# Patient Record
Sex: Male | Born: 1960
Health system: Southern US, Community
[De-identification: ages and names within clinical notes are randomized; demographics above are authoritative.]

## PROBLEM LIST (undated history)

## (undated) ENCOUNTER — Ambulatory Visit: Discharge status: Absent without leave | Payer: Self-pay

## (undated) DIAGNOSIS — I2510 Atherosclerotic heart disease of native coronary artery without angina pectoris: Secondary | ICD-10-CM

## (undated) DIAGNOSIS — E669 Obesity, unspecified: Secondary | ICD-10-CM

## (undated) DIAGNOSIS — J189 Pneumonia, unspecified organism: Secondary | ICD-10-CM

## (undated) DIAGNOSIS — E785 Hyperlipidemia, unspecified: Secondary | ICD-10-CM

## (undated) DIAGNOSIS — N529 Male erectile dysfunction, unspecified: Secondary | ICD-10-CM

## (undated) DIAGNOSIS — G43909 Migraine, unspecified, not intractable, without status migrainosus: Secondary | ICD-10-CM

## (undated) DIAGNOSIS — T7840XA Allergy, unspecified, initial encounter: Secondary | ICD-10-CM

## (undated) DIAGNOSIS — I219 Acute myocardial infarction, unspecified: Secondary | ICD-10-CM

## (undated) DIAGNOSIS — IMO0001 Reserved for inherently not codable concepts without codable children: Secondary | ICD-10-CM

## (undated) DIAGNOSIS — F419 Anxiety disorder, unspecified: Secondary | ICD-10-CM

## (undated) DIAGNOSIS — J449 Chronic obstructive pulmonary disease, unspecified: Secondary | ICD-10-CM

## (undated) DIAGNOSIS — G4730 Sleep apnea, unspecified: Secondary | ICD-10-CM

## (undated) DIAGNOSIS — E1121 Type 2 diabetes mellitus with diabetic nephropathy: Secondary | ICD-10-CM

## (undated) DIAGNOSIS — E119 Type 2 diabetes mellitus without complications: Secondary | ICD-10-CM

## (undated) DIAGNOSIS — I10 Essential (primary) hypertension: Secondary | ICD-10-CM

## (undated) DIAGNOSIS — Z794 Long term (current) use of insulin: Secondary | ICD-10-CM

## (undated) HISTORY — DX: Anxiety disorder, unspecified: F41.9

## (undated) HISTORY — DX: Allergy, unspecified, initial encounter: T78.40XA

## (undated) HISTORY — DX: Reserved for inherently not codable concepts without codable children: IMO0001

## (undated) HISTORY — DX: Hyperlipidemia, unspecified: E78.5

## (undated) HISTORY — PX: CYSTECTOMY: SUR359

## (undated) HISTORY — DX: Atherosclerotic heart disease of native coronary artery without angina pectoris: I25.10

## (undated) HISTORY — DX: Obesity, unspecified: E66.9

## (undated) HISTORY — PX: KNEE ARTHROSCOPY: SUR90

## (undated) HISTORY — DX: Male erectile dysfunction, unspecified: N52.9

## (undated) HISTORY — PX: CARDIAC CATHETERIZATION: SHX172

## (undated) HISTORY — DX: Pneumonia, unspecified organism: J18.9

## (undated) HISTORY — DX: Type 2 diabetes mellitus without complications: E11.9

## (undated) HISTORY — DX: Chronic obstructive pulmonary disease, unspecified: J44.9

## (undated) HISTORY — PX: OTHER SURGICAL HISTORY: SHX169

---
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
## 1994-04-07 HISTORY — PX: KNEE ARTHROSCOPY: SUR90

## 2005-04-07 DIAGNOSIS — I219 Acute myocardial infarction, unspecified: Secondary | ICD-10-CM

## 2005-04-07 HISTORY — DX: Acute myocardial infarction, unspecified: I21.9

## 2005-04-19 ENCOUNTER — Other Ambulatory Visit: Payer: Self-pay

## 2005-04-19 ENCOUNTER — Emergency Department: Payer: Self-pay | Admitting: Emergency Medicine

## 2006-01-27 ENCOUNTER — Ambulatory Visit: Payer: Self-pay

## 2006-09-29 DIAGNOSIS — N529 Male erectile dysfunction, unspecified: Secondary | ICD-10-CM | POA: Insufficient documentation

## 2009-04-13 ENCOUNTER — Ambulatory Visit: Payer: Self-pay | Admitting: Family Medicine

## 2009-05-04 ENCOUNTER — Ambulatory Visit: Payer: Self-pay | Admitting: Family Medicine

## 2009-07-29 ENCOUNTER — Ambulatory Visit: Payer: Self-pay | Admitting: Family Medicine

## 2010-05-06 ENCOUNTER — Ambulatory Visit: Payer: Self-pay | Admitting: Family Medicine

## 2010-05-08 ENCOUNTER — Ambulatory Visit: Payer: Self-pay | Admitting: Family Medicine

## 2010-06-06 ENCOUNTER — Ambulatory Visit: Payer: Self-pay | Admitting: Family Medicine

## 2010-07-07 ENCOUNTER — Ambulatory Visit: Payer: Self-pay | Admitting: Family Medicine

## 2010-08-06 ENCOUNTER — Ambulatory Visit: Payer: Self-pay | Admitting: Family Medicine

## 2010-09-03 ENCOUNTER — Ambulatory Visit: Payer: Self-pay | Admitting: Internal Medicine

## 2011-03-12 ENCOUNTER — Ambulatory Visit: Payer: Self-pay

## 2011-07-07 ENCOUNTER — Ambulatory Visit: Payer: Self-pay

## 2011-08-02 ENCOUNTER — Other Ambulatory Visit: Payer: Self-pay | Admitting: Family Medicine

## 2011-08-02 LAB — COMPREHENSIVE METABOLIC PANEL
Albumin: 3.7 g/dL (ref 3.4–5.0)
Alkaline Phosphatase: 70 U/L (ref 50–136)
Anion Gap: 9 (ref 7–16)
BUN: 16 mg/dL (ref 7–18)
Calcium, Total: 8.5 mg/dL (ref 8.5–10.1)
Chloride: 108 mmol/L — ABNORMAL HIGH (ref 98–107)
EGFR (African American): >60
Potassium: 4 mmol/L (ref 3.5–5.1)
SGOT(AST): 42 U/L — ABNORMAL HIGH (ref 15–37)
SGPT (ALT): 84 U/L — ABNORMAL HIGH
Total Protein: 6.7 g/dL (ref 6.4–8.2)

## 2011-08-02 LAB — LIPID PANEL
Cholesterol: 143 mg/dL (ref 0–200)
HDL Cholesterol: 20 mg/dL — ABNORMAL LOW (ref 40–60)
VLDL Cholesterol, Calc: 74 mg/dL — ABNORMAL HIGH (ref 5–40)

## 2012-08-13 ENCOUNTER — Ambulatory Visit: Payer: Self-pay | Admitting: Family Medicine

## 2012-12-23 ENCOUNTER — Ambulatory Visit: Payer: Self-pay | Admitting: Anesthesiology

## 2012-12-23 DIAGNOSIS — I10 Essential (primary) hypertension: Secondary | ICD-10-CM

## 2012-12-27 ENCOUNTER — Ambulatory Visit: Payer: Self-pay | Admitting: Gastroenterology

## 2012-12-27 LAB — HM COLONOSCOPY

## 2013-09-15 ENCOUNTER — Ambulatory Visit: Payer: Self-pay | Admitting: Emergency Medicine

## 2013-09-15 LAB — URINALYSIS, COMPLETE
BACTERIA: NEGATIVE
BILIRUBIN, UR: NEGATIVE
BLOOD: NEGATIVE
Glucose,UR: NEGATIVE mg/dL (ref 0–75)
Ketone: NEGATIVE
Leukocyte Esterase: NEGATIVE
NITRITE: NEGATIVE
Ph: 7 (ref 4.5–8.0)
Protein: NEGATIVE
SPECIFIC GRAVITY: 1.005 (ref 1.003–1.030)

## 2014-06-08 ENCOUNTER — Ambulatory Visit: Payer: Self-pay | Admitting: Emergency Medicine

## 2014-07-25 ENCOUNTER — Other Ambulatory Visit (HOSPITAL_COMMUNITY): Payer: Self-pay | Admitting: Nurse Practitioner

## 2014-07-25 DIAGNOSIS — R7989 Other specified abnormal findings of blood chemistry: Secondary | ICD-10-CM

## 2014-07-25 DIAGNOSIS — K760 Fatty (change of) liver, not elsewhere classified: Secondary | ICD-10-CM

## 2014-07-25 DIAGNOSIS — R945 Abnormal results of liver function studies: Principal | ICD-10-CM

## 2014-08-01 ENCOUNTER — Ambulatory Visit: Admit: 2014-08-01 | Disposition: A | Discharge status: Home / Self Care | Payer: Self-pay

## 2014-10-06 ENCOUNTER — Other Ambulatory Visit: Payer: Self-pay | Admitting: Family Medicine

## 2014-10-06 NOTE — Telephone Encounter (Signed)
Pt needs a refill on his meds Bydureon. Also pt is flying out to [facility] on 10/22/14 and flying back on 10/28/14. Pt was wondering if there was something you could give him to stop his panic attacks while flying. Pt was hoping you could give him something to try out a week before flying out. HE uses the [facility] at the hospital.

## 2014-10-10 MED ORDER — CLONAZEPAM 0.5 MG PO TABS: 0.5000 mg | ORAL_TABLET | Freq: Two times a day (BID) | ORAL | Status: DC | PRN

## 2014-10-10 MED ORDER — EXENATIDE ER 2 MG ~~LOC~~ PEN: 1.0000 "pen " | PEN_INJECTOR | SUBCUTANEOUS | Status: DC

## 2014-10-10 NOTE — Telephone Encounter (Signed)
Printed klonopin and sent [employer]

## 2014-10-10 NOTE — Telephone Encounter (Signed)
Also would like something for anxiety due to flying,

## 2014-10-11 ENCOUNTER — Other Ambulatory Visit: Payer: Self-pay | Admitting: Family Medicine

## 2014-10-11 NOTE — Telephone Encounter (Signed)
Patient requesting refill. 

## 2014-10-30 ENCOUNTER — Other Ambulatory Visit: Payer: Self-pay | Admitting: Family Medicine

## 2014-10-30 NOTE — Telephone Encounter (Signed)
Patient requesting refill. 

## 2014-11-07 ENCOUNTER — Encounter: Payer: Self-pay | Admitting: Family Medicine

## 2014-11-07 DIAGNOSIS — R6882 Decreased libido: Secondary | ICD-10-CM | POA: Insufficient documentation

## 2014-11-07 DIAGNOSIS — E7800 Pure hypercholesterolemia, unspecified: Secondary | ICD-10-CM | POA: Insufficient documentation

## 2014-11-07 DIAGNOSIS — I2510 Atherosclerotic heart disease of native coronary artery without angina pectoris: Secondary | ICD-10-CM | POA: Insufficient documentation

## 2014-11-07 DIAGNOSIS — Z9229 Personal history of other drug therapy: Secondary | ICD-10-CM | POA: Insufficient documentation

## 2014-11-07 DIAGNOSIS — I10 Essential (primary) hypertension: Secondary | ICD-10-CM | POA: Insufficient documentation

## 2014-11-07 DIAGNOSIS — K219 Gastro-esophageal reflux disease without esophagitis: Secondary | ICD-10-CM | POA: Insufficient documentation

## 2014-11-07 DIAGNOSIS — R809 Proteinuria, unspecified: Secondary | ICD-10-CM | POA: Insufficient documentation

## 2014-11-07 DIAGNOSIS — IMO0002 Reserved for concepts with insufficient information to code with codable children: Secondary | ICD-10-CM | POA: Insufficient documentation

## 2014-11-07 DIAGNOSIS — J3089 Other allergic rhinitis: Secondary | ICD-10-CM | POA: Insufficient documentation

## 2014-11-07 DIAGNOSIS — E1129 Type 2 diabetes mellitus with other diabetic kidney complication: Secondary | ICD-10-CM | POA: Insufficient documentation

## 2014-11-07 DIAGNOSIS — Z720 Tobacco use: Secondary | ICD-10-CM | POA: Insufficient documentation

## 2014-11-07 DIAGNOSIS — I252 Old myocardial infarction: Secondary | ICD-10-CM | POA: Insufficient documentation

## 2014-11-07 DIAGNOSIS — K7581 Nonalcoholic steatohepatitis (NASH): Secondary | ICD-10-CM | POA: Insufficient documentation

## 2014-11-07 DIAGNOSIS — L259 Unspecified contact dermatitis, unspecified cause: Secondary | ICD-10-CM | POA: Insufficient documentation

## 2014-11-07 DIAGNOSIS — E781 Pure hyperglyceridemia: Secondary | ICD-10-CM | POA: Insufficient documentation

## 2014-11-07 DIAGNOSIS — B351 Tinea unguium: Secondary | ICD-10-CM | POA: Insufficient documentation

## 2014-11-10 ENCOUNTER — Encounter: Payer: Self-pay | Admitting: Family Medicine

## 2014-11-10 ENCOUNTER — Ambulatory Visit (INDEPENDENT_AMBULATORY_CARE_PROVIDER_SITE_OTHER): Payer: 59 | Admitting: Family Medicine

## 2014-11-10 VITALS — BP 118/78 | HR 85 | Temp 98.4°F | Resp 14 | Ht 68.0 in | Wt 229.8 lb

## 2014-11-10 DIAGNOSIS — K219 Gastro-esophageal reflux disease without esophagitis: Secondary | ICD-10-CM | POA: Diagnosis not present

## 2014-11-10 DIAGNOSIS — I252 Old myocardial infarction: Secondary | ICD-10-CM | POA: Diagnosis not present

## 2014-11-10 DIAGNOSIS — E1121 Type 2 diabetes mellitus with diabetic nephropathy: Secondary | ICD-10-CM | POA: Diagnosis not present

## 2014-11-10 DIAGNOSIS — I2510 Atherosclerotic heart disease of native coronary artery without angina pectoris: Secondary | ICD-10-CM | POA: Diagnosis not present

## 2014-11-10 DIAGNOSIS — I10 Essential (primary) hypertension: Secondary | ICD-10-CM | POA: Diagnosis not present

## 2014-11-10 DIAGNOSIS — J309 Allergic rhinitis, unspecified: Secondary | ICD-10-CM

## 2014-11-10 DIAGNOSIS — E7800 Pure hypercholesterolemia, unspecified: Secondary | ICD-10-CM

## 2014-11-10 DIAGNOSIS — J3089 Other allergic rhinitis: Secondary | ICD-10-CM

## 2014-11-10 LAB — POCT GLYCOSYLATED HEMOGLOBIN (HGB A1C): HEMOGLOBIN A1C: 7.1

## 2014-11-10 MED ORDER — CLOPIDOGREL BISULFATE 75 MG PO TABS: 75.0000 mg | ORAL_TABLET | Freq: Every day | ORAL | Status: DC

## 2014-11-10 MED ORDER — EXENATIDE ER 2 MG ~~LOC~~ PEN: 1.0000 "pen " | PEN_INJECTOR | SUBCUTANEOUS | Status: DC

## 2014-11-10 MED ORDER — LISINOPRIL 10 MG PO TABS: 10.0000 mg | ORAL_TABLET | Freq: Every day | ORAL | Status: DC

## 2014-11-10 MED ORDER — EMPAGLIFLOZIN-METFORMIN HCL 12.5-500 MG PO TABS
1.0000 | ORAL_TABLET | Freq: Two times a day (BID) | ORAL | Status: DC
Start: 2014-11-10 — End: 2015-02-16

## 2014-11-10 MED ORDER — PANTOPRAZOLE SODIUM 40 MG PO TBEC
40.0000 mg | DELAYED_RELEASE_TABLET | Freq: Every day | ORAL | Status: DC
Start: 2014-11-10 — End: 2015-05-21

## 2014-11-10 MED ORDER — FLUTICASONE PROPIONATE 50 MCG/ACT NA SUSP: 2.0000 | NASAL | Status: DC | PRN

## 2014-11-10 MED ORDER — ROSUVASTATIN CALCIUM 40 MG PO TABS
40.0000 mg | ORAL_TABLET | Freq: Every day | ORAL | Status: DC
Start: 2014-11-10 — End: 2015-02-05

## 2014-11-10 NOTE — Progress Notes (Signed)
Name: Jesse Lawrence.   MRN: [id number]    DOB: 1960-09-17   Date:11/10/2014       Progress Note  Subjective  Chief Complaint  Chief Complaint  Patient presents with  . Diabetes    checks BG 2x per day high-180, low-60's  . Hypertension  . Hyperlipidemia  . Abrasion    went to dermatology yesterday and they did biopsy and her wanted u to check and make sure healing  prooperly    HPI  DMII with diabetic nephropathy: urine micro was 100 in April, hgbA1C is down, from 10.8% and is now 7.1%, he states his glucose has improved at home and denies side effects of medication. No polyuria, polydipsia or polyphagia.  He has been compliant with his medication.  HTN: taking medication and bp is at goal  CAD and history of MI: taking Plavix, beta blocker and ace inhibitor. He denies sob or chest pain, he is compliant with his medications  Wound on his upper back: he went to the dermatologist and had a lesion removed from his upper back yesterday, it was pre-cancerous, not sure of the name, feeling well.  Patient Active Problem List   Diagnosis Date Noted  . Perennial allergic rhinitis 11/07/2014  . Arteriosclerosis of coronary artery 11/07/2014  . CD (contact dermatitis) 11/07/2014  . Decreased libido 11/07/2014  . Diabetes mellitus with renal manifestation 11/07/2014  . Gastro-esophageal reflux disease without esophagitis 11/07/2014  . H/O acute myocardial infarction 11/07/2014  . Hypercholesteremia 11/07/2014  . Benign hypertension 11/07/2014  . Hypertriglyceridemia 11/07/2014  . H/O high risk medication treatment 11/07/2014  . NASH (nonalcoholic steatohepatitis) 11/07/2014  . Microalbuminuria 11/07/2014  . Adult BMI 30+ 11/07/2014  . Fungal infection of toenail 11/07/2014  . Tobacco abuse 11/07/2014  . ED (erectile dysfunction) of organic origin 09/29/2006    Past Surgical History  Procedure Laterality Date  . Knee arthroscopy    . Cadiac stenting      Family History   Problem Relation Age of Onset  . Diabetes Mother   . CAD Mother   . Heart disease Mother     History   Social History  . Marital Status: Married    Spouse Name: N/A  . Number of Children: N/A  . Years of Education: N/A   Occupational History  . Not on file.   Social History Main Topics  . Smoking status: Former Smoker -- 1.50 packs/day for 30 years    Types: Cigarettes    Quit date: 04/19/2013  . Smokeless tobacco: Current User  . Alcohol Use: No  . Drug Use: No  . Sexual Activity: Yes   Other Topics Concern  . Not on file   Social History Narrative     Current outpatient prescriptions:  .  Cholecalciferol (VITAMIN D3) 50000 UNITS CAPS, Take by mouth., Disp: , Rfl:  .  clonazePAM (KLONOPIN) 0.5 MG tablet, Take 1 tablet (0.5 mg total) by mouth 2 (two) times daily as needed for anxiety., Disp: 20 tablet, Rfl: 1 .  clopidogrel (PLAVIX) 75 MG tablet, Take 1 tablet (75 mg total) by mouth daily., Disp: 90 tablet, Rfl: 1 .  cyclobenzaprine (FLEXERIL) 10 MG tablet, Take 1 tablet by mouth as needed., Disp: , Rfl:  .  Empagliflozin-Metformin HCl (SYNJARDY) 12.5-500 MG TABS, Take 1 tablet by mouth 2 (two) times daily., Disp: 60 tablet, Rfl: 5 .  Exenatide ER (BYDUREON) 2 MG PEN, Inject 1 pen into the skin once a week., Disp: 4 each,  Rfl: 5 .  fluticasone (FLONASE) 50 MCG/ACT nasal spray, Place 2 sprays into both nostrils as needed., Disp: 48 g, Rfl: 1 .  GLUCOSE BLOOD VI, , Disp: , Rfl:  .  lisinopril (PRINIVIL,ZESTRIL) 10 MG tablet, Take 1 tablet (10 mg total) by mouth daily., Disp: 90 tablet, Rfl: 1 .  loratadine (CLARITIN) 10 MG tablet, Take 1 tablet by mouth daily., Disp: , Rfl:  .  metoprolol tartrate (LOPRESSOR) 25 MG tablet, TAKE 1 TABLET BY MOUTH TWICE DAILY, Disp: 180 tablet, Rfl: 1 .  pantoprazole (PROTONIX) 40 MG tablet, Take 1 tablet (40 mg total) by mouth daily., Disp: 90 tablet, Rfl: 1 .  rosuvastatin (CRESTOR) 40 MG tablet, Take 1 tablet (40 mg total) by mouth  daily., Disp: 90 tablet, Rfl: 1 .  tadalafil (CIALIS) 5 MG tablet, Take 1 tablet by mouth 1 day or 1 dose., Disp: , Rfl:   Not on File   ROS  Constitutional: Negative for fever or significant weight change.  Respiratory: Negative for cough and shortness of breath.   Cardiovascular: Negative for chest pain or palpitations.  Gastrointestinal: Negative for abdominal pain, no bowel changes.  Musculoskeletal: Negative for gait problem or joint swelling.  Skin: Negative for rash. Recent biopsy on his back Neurological: Negative for dizziness or headache.  No other specific complaints in a complete review of systems (except as listed in HPI above).  Objective  Filed Vitals:   11/10/14 0953  BP: 118/78  Pulse: 85  Temp: 98.4 F (36.9 C)  TempSrc: Oral  Resp: 14  Height: 5' 8"  (1.727 m)  Weight: 229 lb 12.8 oz (104.237 kg)  SpO2: 95%    Body mass index is 34.95 kg/(m^2).  Physical Exam  Constitutional: Patient appears well-developed and well-nourished. Obese  No distress.  Eyes:  No scleral icterus. PERL Neck: Normal range of motion. Neck supple. Cardiovascular: Normal rate, regular rhythm and normal heart sounds.  No murmur heard. No BLE edema. Pulmonary/Chest: Effort normal and breath sounds normal. No respiratory distress. Abdominal: Soft.  There is no tenderness. Psychiatric: Patient has a normal mood and affect. behavior is normal. Judgment and thought content normal. Skin: no redness around the wound on his back  Recent Results (from the past 2160 hour(s))  POCT HgB A1C     Status: Abnormal   Collection Time: 11/10/14 10:15 AM  Result Value Ref Range   Hemoglobin A1C 7.1       PHQ2/9: Depression screen PHQ 2/9 11/10/2014  Decreased Interest 0  Down, Depressed, Hopeless 0  PHQ - 2 Score 0    Fall Risk: Fall Risk  11/10/2014  Falls in the past year? No      Assessment & Plan  1. Type 2 diabetes mellitus with diabetic nephropathy Doing well - POCT HgB  A1C - Exenatide ER (BYDUREON) 2 MG PEN; Inject 1 pen into the skin once a week.  Dispense: 4 each; Refill: 5 - Empagliflozin-Metformin HCl (SYNJARDY) 12.5-500 MG TABS; Take 1 tablet by mouth 2 (two) times daily.  Dispense: 60 tablet; Refill: 5  2. Benign hypertension At goal  - lisinopril (PRINIVIL,ZESTRIL) 10 MG tablet; Take 1 tablet (10 mg total) by mouth daily.  Dispense: 90 tablet; Refill: 1  3. Hypercholesteremia Recheck next visit  - rosuvastatin (CRESTOR) 40 MG tablet; Take 1 tablet (40 mg total) by mouth daily.  Dispense: 90 tablet; Refill: 1  4. H/O acute myocardial infarction No recent symptoms - clopidogrel (PLAVIX) 75 MG tablet; Take 1 tablet (75  mg total) by mouth daily.  Dispense: 90 tablet; Refill: 1  5. Arteriosclerosis of coronary artery Continue   6. Gastro-esophageal reflux disease without esophagitis  - pantoprazole (PROTONIX) 40 MG tablet; Take 1 tablet (40 mg total) by mouth daily.  Dispense: 90 tablet; Refill: 1  7. Perennial allergic rhinitis  - fluticasone (FLONASE) 50 MCG/ACT nasal spray; Place 2 sprays into both nostrils as needed.  Dispense: 48 g; Refill: 1

## 2014-11-20 ENCOUNTER — Encounter: Payer: Self-pay | Admitting: Family Medicine

## 2014-11-23 ENCOUNTER — Other Ambulatory Visit: Payer: Self-pay | Admitting: Family Medicine

## 2014-11-23 NOTE — Telephone Encounter (Signed)
Patient requesting refill. 

## 2015-01-08 ENCOUNTER — Telehealth: Payer: Self-pay | Admitting: Family Medicine

## 2015-01-08 NOTE — Telephone Encounter (Signed)
Requesting antibotic for sinus infection. It began Saturday with clear/white drainage in the throat. No facial pain at the present moment. Please send to [facility]

## 2015-01-09 ENCOUNTER — Telehealth: Payer: Self-pay

## 2015-01-09 NOTE — Telephone Encounter (Signed)
It is likely viral, he can try otc saline/ netty pot, also otc cold medications , it does not seem like he needs antibiotics at this time

## 2015-01-09 NOTE — Telephone Encounter (Signed)
Pt notified and states already feeling better.

## 2015-02-02 ENCOUNTER — Telehealth: Payer: Self-pay | Admitting: Family Medicine

## 2015-02-02 NOTE — Telephone Encounter (Signed)
Pt called insurance company and was told he could get Byduren, this is a substitute with Trvlicity, Iva Boop is being substitute with Metformin, Crestor, substitute with Atorvastatin plavis stitute with Clopidogrel. Going to [facility] Pt states the pharmacy is also faxing over a RX for Cialis.

## 2015-02-05 MED ORDER — DULAGLUTIDE 1.5 MG/0.5ML ~~LOC~~ SOAJ: 1.5000 mg | SUBCUTANEOUS | Status: DC

## 2015-02-05 MED ORDER — ATORVASTATIN CALCIUM 80 MG PO TABS: 80.0000 mg | ORAL_TABLET | Freq: Every day | ORAL | Status: DC

## 2015-02-05 NOTE — Telephone Encounter (Signed)
  Ok please send all the new rx's in and he said write something generic similar to synjardy?

## 2015-02-05 NOTE — Telephone Encounter (Signed)
He needs to ask if they pay for [place] or [employer]? We should have vouchers also, please verify

## 2015-02-05 NOTE — Telephone Encounter (Signed)
Jesse Lawrence is not the same as Metformin I can switch from Bydureon to Trulicity Clopidogrel is the generic form of Plavix I will sent Atorvastatin in place of Crestor Please enter his drug allergies to our chart. Thank you

## 2015-02-06 NOTE — Telephone Encounter (Signed)
Patient notified will call insurance, to find out what meds are covered

## 2015-02-16 ENCOUNTER — Encounter: Payer: Self-pay | Admitting: Family Medicine

## 2015-02-16 ENCOUNTER — Ambulatory Visit (INDEPENDENT_AMBULATORY_CARE_PROVIDER_SITE_OTHER): Payer: Managed Care, Other (non HMO) | Admitting: Family Medicine

## 2015-02-16 VITALS — BP 122/86 | HR 78 | Temp 98.1°F | Resp 16 | Ht 68.0 in | Wt 237.1 lb

## 2015-02-16 DIAGNOSIS — E1121 Type 2 diabetes mellitus with diabetic nephropathy: Secondary | ICD-10-CM

## 2015-02-16 DIAGNOSIS — I2510 Atherosclerotic heart disease of native coronary artery without angina pectoris: Secondary | ICD-10-CM

## 2015-02-16 DIAGNOSIS — I10 Essential (primary) hypertension: Secondary | ICD-10-CM

## 2015-02-16 DIAGNOSIS — N528 Other male erectile dysfunction: Secondary | ICD-10-CM | POA: Diagnosis not present

## 2015-02-16 DIAGNOSIS — N529 Male erectile dysfunction, unspecified: Secondary | ICD-10-CM

## 2015-02-16 DIAGNOSIS — K219 Gastro-esophageal reflux disease without esophagitis: Secondary | ICD-10-CM | POA: Diagnosis not present

## 2015-02-16 DIAGNOSIS — Z23 Encounter for immunization: Secondary | ICD-10-CM | POA: Diagnosis not present

## 2015-02-16 LAB — POCT GLYCOSYLATED HEMOGLOBIN (HGB A1C): Hemoglobin A1C: 7.8

## 2015-02-16 MED ORDER — GLIPIZIDE ER 5 MG PO TB24
5.0000 mg | ORAL_TABLET | Freq: Every day | ORAL | Status: DC
Start: 2015-02-16 — End: 2015-05-13

## 2015-02-16 MED ORDER — METFORMIN HCL 850 MG PO TABS: 850.0000 mg | ORAL_TABLET | Freq: Two times a day (BID) | ORAL | Status: DC

## 2015-02-16 NOTE — Progress Notes (Signed)
Name: Jesse Lawrence.   MRN: [id number]    DOB: November 25, 1960   Date:02/16/2015       Progress Note  Subjective  Chief Complaint  Chief Complaint  Patient presents with  . Medication Refill    follow-up  . Diabetes    not currtenly on any diabetic meds  . Hypertension  . Hyperlipidemia  . Gastroesophageal Reflux    HPI  DM II : patient is currently out of all his medications. His wife quit her job to care for her mother, he is back on his won [employer]/product that has a high deductible ( $3000,00 ) and he has been unable to afford his medication.  His average glucose has been in the 200's since he ran our of medication. Highest of 280, low of 140's. He denies polyphagia, polydipsia, polyuria . He occasionally has blurred vision.   HTN: taking medication, and denies chest pain or SOB  Hyperlipidemia: out of Crestor because of cost, sent Lipitor earlier this week but did not go pick it up yet  GERD: taking Pantoprazole, usually controlled, no heartburn or regurgitation when following a diet but when he splurges symptoms are present  ED: he is taking Cialis prn and it works well for him. Helps his start an erection but not always helps to maintain an erection. He has episodes of weak urinary stream, nocturia usually once per night, no hesitancy, no dribbling   CAD: s/p MI and stent placed taking plavix, also beta-blocker but out of statin because of cost. No chest pain or decrease in exercise tolerance.    Patient Active Problem List   Diagnosis Date Noted  . Perennial allergic rhinitis 11/07/2014  . Arteriosclerosis of coronary artery 11/07/2014  . CD (contact dermatitis) 11/07/2014  . Decreased libido 11/07/2014  . Diabetes mellitus with renal manifestation ([place]) 11/07/2014  . Gastro-esophageal reflux disease without esophagitis 11/07/2014  . H/O acute myocardial infarction 11/07/2014  . Hypercholesteremia 11/07/2014  . Benign hypertension 11/07/2014  .  Hypertriglyceridemia 11/07/2014  . H/O high risk medication treatment 11/07/2014  . NASH (nonalcoholic steatohepatitis) 11/07/2014  . Microalbuminuria 11/07/2014  . Adult BMI 30+ 11/07/2014  . Fungal infection of toenail 11/07/2014  . Tobacco abuse 11/07/2014  . ED (erectile dysfunction) of organic origin 09/29/2006    Past Surgical History  Procedure Laterality Date  . Knee arthroscopy    . Cadiac stenting      Family History  Problem Relation Age of Onset  . Diabetes Mother   . CAD Mother   . Heart disease Mother     Social History   Social History  . Marital Status: Married    Spouse Name: N/A  . Number of Children: N/A  . Years of Education: N/A   Occupational History  . Not on file.   Social History Main Topics  . Smoking status: Former Smoker -- 1.50 packs/day for 30 years    Types: Cigarettes    Quit date: 04/19/2013  . Smokeless tobacco: Current User  . Alcohol Use: No  . Drug Use: No  . Sexual Activity: Yes   Other Topics Concern  . Not on file   Social History Narrative     Current outpatient prescriptions:  .  Exenatide ER (BYDUREON) 2 MG PEN, Inject 1 tablet into the skin once a week., Disp: , Rfl:  .  atorvastatin (LIPITOR) 80 MG tablet, Take 1 tablet (80 mg total) by mouth daily., Disp: 90 tablet, Rfl: 0 .  clonazePAM (KLONOPIN)  0.5 MG tablet, Take 1 tablet (0.5 mg total) by mouth 2 (two) times daily as needed for anxiety., Disp: 20 tablet, Rfl: 1 .  clopidogrel (PLAVIX) 75 MG tablet, Take 1 tablet (75 mg total) by mouth daily., Disp: 90 tablet, Rfl: 1 .  fluticasone (FLONASE) 50 MCG/ACT nasal spray, Place 2 sprays into both nostrils as needed., Disp: 48 g, Rfl: 1 .  glipiZIDE (GLIPIZIDE XL) 5 MG 24 hr tablet, Take 1 tablet (5 mg total) by mouth daily with breakfast., Disp: 30 tablet, Rfl: 2 .  GLUCOSE BLOOD VI, , Disp: , Rfl:  .  lisinopril (PRINIVIL,ZESTRIL) 10 MG tablet, Take 1 tablet (10 mg total) by mouth daily., Disp: 90 tablet, Rfl: 1 .   loratadine (CLARITIN) 10 MG tablet, Take 1 tablet by mouth daily., Disp: , Rfl:  .  metFORMIN (GLUCOPHAGE) 850 MG tablet, Take 1 tablet (850 mg total) by mouth 2 (two) times daily with a meal., Disp: 60 tablet, Rfl: 2 .  metoprolol tartrate (LOPRESSOR) 25 MG tablet, TAKE 1 TABLET BY MOUTH TWICE DAILY, Disp: 180 tablet, Rfl: 1 .  pantoprazole (PROTONIX) 40 MG tablet, Take 1 tablet (40 mg total) by mouth daily., Disp: 90 tablet, Rfl: 1 .  tadalafil (CIALIS) 5 MG tablet, Take 1 tablet by mouth 1 day or 1 dose., Disp: , Rfl:   Not on File   ROS  Constitutional: Negative for fever or weight change.  Respiratory: Negative for cough and shortness of breath.   Cardiovascular: Negative for chest pain or palpitations.  Gastrointestinal: Negative for abdominal pain, no bowel changes.  Musculoskeletal: Negative for gait problem occasional  joint swelling - left knee  Skin: Negative for rash.  Neurological: Negative for dizziness or headache.  No other specific complaints in a complete review of systems (except as listed in HPI above).  Objective  Filed Vitals:   02/16/15 0945  BP: 122/86  Pulse: 78  Temp: 98.1 F (36.7 C)  TempSrc: Oral  Resp: 16  Height: 5' 8"  (1.727 m)  Weight: 237 lb 1.6 oz (107.548 kg)  SpO2: 97%    Body mass index is 36.06 kg/(m^2).  Physical Exam   Constitutional: Patient appears well-developed and well-nourished. Obese  No distress.  HEENT: head atraumatic, normocephalic, pupils equal and reactive to light, neck supple, throat within normal limits Cardiovascular: Normal rate, regular rhythm and normal heart sounds.  No murmur heard. No BLE edema. Pulmonary/Chest: Effort normal and breath sounds normal. No respiratory distress. Abdominal: Soft.  There is no tenderness. Psychiatric: Patient has a normal mood and affect. behavior is normal. Judgment and thought content normal. Skin: very dry skin on his legs  PHQ2/9: Depression screen [facility] 2/9 02/16/2015  11/10/2014  Decreased Interest 0 0  Down, Depressed, Hopeless 0 0  PHQ - 2 Score 0 0    Fall Risk: Fall Risk  02/16/2015 11/10/2014  Falls in the past year? No No    Functional Status Survey: Is the patient deaf or have difficulty hearing?: No Does the patient have difficulty seeing, even when wearing glasses/contacts?: Yes (glasses) Does the patient have difficulty concentrating, remembering, or making decisions?: No Does the patient have difficulty walking or climbing stairs?: No Does the patient have difficulty dressing or bathing?: No Does the patient have difficulty doing errands alone such as visiting a doctor's office or shopping?: No    Assessment & Plan  1. Diabetic nephropathy associated with type 2 diabetes mellitus ([place])  We will change medication to decrease cost and  increase compliance, continue ace for kidney protection - POCT HgB A1C -7.8% today - POCT UA - Microalbumin - metFORMIN (GLUCOPHAGE) 850 MG tablet; Take 1 tablet (850 mg total) by mouth 2 (two) times daily with a meal.  Dispense: 60 tablet; Refill: 2 - glipiZIDE (GLIPIZIDE XL) 5 MG 24 hr tablet; Take 1 tablet (5 mg total) by mouth daily with breakfast.  Dispense: 30 tablet; Refill: 2  2. Needs flu shot  - Flu Vaccine QUAD 36+ mos PF IM (Fluarix & Fluzone Quad PF) - can't afford it at this time  3. Gastro-esophageal reflux disease without esophagitis  Discussed importance of dietary compliance also   4. Benign hypertension  Well controlled  5. Arteriosclerosis of coronary artery  Resume statin therapy   6. ED (erectile dysfunction) of organic origin  Continue Cialis

## 2015-02-27 ENCOUNTER — Ambulatory Visit (INDEPENDENT_AMBULATORY_CARE_PROVIDER_SITE_OTHER): Payer: PRIVATE HEALTH INSURANCE

## 2015-02-27 ENCOUNTER — Ambulatory Visit
Admission: EM | Admit: 2015-02-27 | Discharge: 2015-02-27 | Disposition: A | Discharge status: Home / Self Care | Payer: PRIVATE HEALTH INSURANCE | Attending: Family Medicine | Admitting: Family Medicine

## 2015-02-27 DIAGNOSIS — S161XXA Strain of muscle, fascia and tendon at neck level, initial encounter: Secondary | ICD-10-CM

## 2015-02-27 DIAGNOSIS — T148XXA Other injury of unspecified body region, initial encounter: Secondary | ICD-10-CM

## 2015-02-27 DIAGNOSIS — T148 Other injury of unspecified body region: Secondary | ICD-10-CM | POA: Diagnosis not present

## 2015-02-27 MED ORDER — CYCLOBENZAPRINE HCL 10 MG PO TABS: 10.0000 mg | ORAL_TABLET | Freq: Three times a day (TID) | ORAL | Status: DC | PRN

## 2015-02-27 MED ORDER — MELOXICAM 7.5 MG PO TABS: 7.5000 mg | ORAL_TABLET | Freq: Every day | ORAL | Status: AC
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
## 2015-03-29 ENCOUNTER — Ambulatory Visit (INDEPENDENT_AMBULATORY_CARE_PROVIDER_SITE_OTHER): Payer: Managed Care, Other (non HMO) | Admitting: Family Medicine

## 2015-03-29 ENCOUNTER — Encounter: Payer: Self-pay | Admitting: Family Medicine

## 2015-03-29 VITALS — BP 126/88 | HR 99 | Temp 98.3°F | Resp 18 | Wt 238.4 lb

## 2015-03-29 DIAGNOSIS — J069 Acute upper respiratory infection, unspecified: Secondary | ICD-10-CM | POA: Diagnosis not present

## 2015-03-29 MED ORDER — GUAIFENESIN-CODEINE 100-10 MG/5ML PO SYRP: 5.0000 mL | ORAL_SOLUTION | Freq: Three times a day (TID) | ORAL | Status: DC | PRN

## 2015-03-29 MED ORDER — FLUTICASONE FUROATE-VILANTEROL 100-25 MCG/INH IN AEPB: 1.0000 | INHALATION_SPRAY | Freq: Every day | RESPIRATORY_TRACT | Status: DC

## 2015-03-29 NOTE — Progress Notes (Signed)
Name: Jesse Lawrence.   MRN: [id number]    DOB: [birth year]   Date:03/29/2015       Progress Note  Subjective  Chief Complaint  Chief Complaint  Patient presents with  . URI    onset 6 days improving slightly.  Syptoms include sore throat, cough up brown mucous and sneezing.  Taking halls cough drops.    HPI  URI: he states symptoms started 6 weeks ago. Started with a sore throat, sneezing, post-nasal drainage and now has a productive cough, with yellow to brown sputum. Taking halls. Sore throat has improved but cough is worse. Chills has resolved. No SOB. No rashes, no change in bowel movement.   Patient Active Problem List   Diagnosis Date Noted  . Perennial allergic rhinitis 11/07/2014  . Arteriosclerosis of coronary artery 11/07/2014  . CD (contact dermatitis) 11/07/2014  . Decreased libido 11/07/2014  . Diabetes mellitus with renal manifestation ([place]) 11/07/2014  . Gastro-esophageal reflux disease without esophagitis 11/07/2014  . H/O acute myocardial infarction 11/07/2014  . Hypercholesteremia 11/07/2014  . Benign hypertension 11/07/2014  . Hypertriglyceridemia 11/07/2014  . H/O high risk medication treatment 11/07/2014  . NASH (nonalcoholic steatohepatitis) 11/07/2014  . Microalbuminuria 11/07/2014  . Adult BMI 30+ 11/07/2014  . Fungal infection of toenail 11/07/2014  . Tobacco abuse 11/07/2014  . ED (erectile dysfunction) of organic origin 09/29/2006    Past Surgical History  Procedure Laterality Date  . Knee arthroscopy    . Cadiac stenting      Family History  Problem Relation Age of Onset  . Diabetes Mother   . CAD Mother   . Heart disease Mother   . Heart disease Father     Social History   Social History  . Marital Status: Married    Spouse Name: N/A  . Number of Children: N/A  . Years of Education: N/A   Occupational History  . Not on file.   Social History Main Topics  . Smoking status: Former Smoker -- 1.50 packs/day for 30 years   Types: Cigarettes    Quit date: 04/19/2013  . Smokeless tobacco: Current User  . Alcohol Use: No  . Drug Use: No  . Sexual Activity: Yes   Other Topics Concern  . Not on file   Social History Narrative     Current outpatient prescriptions:  .  atorvastatin (LIPITOR) 80 MG tablet, Take 1 tablet (80 mg total) by mouth daily., Disp: 90 tablet, Rfl: 0 .  clonazePAM (KLONOPIN) 0.5 MG tablet, Take 1 tablet (0.5 mg total) by mouth 2 (two) times daily as needed for anxiety., Disp: 20 tablet, Rfl: 1 .  clopidogrel (PLAVIX) 75 MG tablet, Take 1 tablet (75 mg total) by mouth daily., Disp: 90 tablet, Rfl: 1 .  cyclobenzaprine (FLEXERIL) 10 MG tablet, Take 1 tablet (10 mg total) by mouth every 8 (eight) hours as needed for muscle spasms (PRN pain. Do not drive or operate heavy machinery while taking as can cause drowsiness.)., Disp: 12 tablet, Rfl: 0 .  Exenatide ER (BYDUREON) 2 MG PEN, Inject 1 tablet into the skin once a week., Disp: , Rfl:  .  fluticasone (FLONASE) 50 MCG/ACT nasal spray, Place 2 sprays into both nostrils as needed., Disp: 48 g, Rfl: 1 .  glipiZIDE (GLIPIZIDE XL) 5 MG 24 hr tablet, Take 1 tablet (5 mg total) by mouth daily with breakfast., Disp: 30 tablet, Rfl: 2 .  GLUCOSE BLOOD VI, , Disp: , Rfl:  .  guaiFENesin-codeine (CHERATUSSIN AC)  100-10 MG/5ML syrup, Take 5 mLs by mouth 3 (three) times daily as needed for cough., Disp: 120 mL, Rfl: 0 .  lisinopril (PRINIVIL,ZESTRIL) 10 MG tablet, Take 1 tablet (10 mg total) by mouth daily., Disp: 90 tablet, Rfl: 1 .  loratadine (CLARITIN) 10 MG tablet, Take 1 tablet by mouth daily., Disp: , Rfl:  .  metFORMIN (GLUCOPHAGE) 850 MG tablet, Take 1 tablet (850 mg total) by mouth 2 (two) times daily with a meal., Disp: 60 tablet, Rfl: 2 .  metoprolol tartrate (LOPRESSOR) 25 MG tablet, TAKE 1 TABLET BY MOUTH TWICE DAILY, Disp: 180 tablet, Rfl: 1 .  pantoprazole (PROTONIX) 40 MG tablet, Take 1 tablet (40 mg total) by mouth daily., Disp: 90 tablet,  Rfl: 1 .  tadalafil (CIALIS) 5 MG tablet, Take 1 tablet by mouth 1 day or 1 dose., Disp: , Rfl:   No Known Allergies   ROS  Ten systems reviewed and is negative except as mentioned in HPI   Objective  Filed Vitals:   03/29/15 1042  BP: 126/88  Pulse: 99  Temp: 98.3 F (36.8 C)  TempSrc: Oral  Resp: 18  Weight: 238 lb 6.4 oz (108.138 kg)  SpO2: 95%    Body mass index is 36.26 kg/(m^2).  Physical Exam  Constitutional: Patient appears well-developed and well-nourished. Obese No distress.  HEENT: head atraumatic, normocephalic, pupils equal and reactive to light, ears TM, neck supple, ,mild erythema throat, and mild yellow post-nasal drainage, no tenderness during percussion of sinus Cardiovascular: Normal rate, regular rhythm and normal heart sounds.  No murmur heard. No BLE edema. Pulmonary/Chest: Effort normal and breath sounds normal. No respiratory distress. Abdominal: Soft.  There is no tenderness. Psychiatric: Patient has a normal mood and affect. behavior is normal. Judgment and thought content normal.  Recent Results (from the past 2160 hour(s))  POCT HgB A1C     Status: Abnormal   Collection Time: 02/16/15 10:21 AM  Result Value Ref Range   Hemoglobin A1C 7.8     PHQ2/9: Depression screen [facility] 2/9 02/16/2015 11/10/2014  Decreased Interest 0 0  Down, Depressed, Hopeless 0 0  PHQ - 2 Score 0 0    Fall Risk: Fall Risk  02/16/2015 11/10/2014  Falls in the past year? No No    Assessment & Plan  1. Upper respiratory infection  - guaiFENesin-codeine (CHERATUSSIN AC) 100-10 MG/5ML syrup; Take 5 mLs by mouth 3 (three) times daily as needed for cough.  Dispense: 120 mL; Refill: 0 - Fluticasone Furoate-Vilanterol (BREO ELLIPTA) 100-25 MCG/INH AEPB; Inhale 1 puff into the lungs daily.  Dispense: 60 each; Refill: 0

## 2015-04-04 ENCOUNTER — Other Ambulatory Visit: Payer: Self-pay | Admitting: Family Medicine

## 2015-04-04 NOTE — Telephone Encounter (Signed)
Patient requesting refill. 

## 2015-05-03 ENCOUNTER — Other Ambulatory Visit: Payer: Self-pay | Admitting: Family Medicine

## 2015-05-03 NOTE — Telephone Encounter (Signed)
Patient requesting refill. 

## 2015-05-06 ENCOUNTER — Other Ambulatory Visit: Payer: Self-pay | Admitting: Family Medicine

## 2015-05-12 ENCOUNTER — Other Ambulatory Visit: Payer: Self-pay | Admitting: Family Medicine

## 2015-05-13 ENCOUNTER — Other Ambulatory Visit: Payer: Self-pay | Admitting: Family Medicine

## 2015-05-21 ENCOUNTER — Ambulatory Visit (INDEPENDENT_AMBULATORY_CARE_PROVIDER_SITE_OTHER): Payer: Managed Care, Other (non HMO) | Admitting: Family Medicine

## 2015-05-21 ENCOUNTER — Encounter: Payer: Self-pay | Admitting: Family Medicine

## 2015-05-21 VITALS — BP 132/80 | HR 87 | Temp 98.1°F | Resp 18 | Ht 68.0 in | Wt 237.4 lb

## 2015-05-21 DIAGNOSIS — K7581 Nonalcoholic steatohepatitis (NASH): Secondary | ICD-10-CM

## 2015-05-21 DIAGNOSIS — I10 Essential (primary) hypertension: Secondary | ICD-10-CM | POA: Diagnosis not present

## 2015-05-21 DIAGNOSIS — E7800 Pure hypercholesterolemia, unspecified: Secondary | ICD-10-CM | POA: Diagnosis not present

## 2015-05-21 DIAGNOSIS — G4733 Obstructive sleep apnea (adult) (pediatric): Secondary | ICD-10-CM | POA: Diagnosis not present

## 2015-05-21 DIAGNOSIS — R05 Cough: Secondary | ICD-10-CM | POA: Diagnosis not present

## 2015-05-21 DIAGNOSIS — I2510 Atherosclerotic heart disease of native coronary artery without angina pectoris: Secondary | ICD-10-CM

## 2015-05-21 DIAGNOSIS — N528 Other male erectile dysfunction: Secondary | ICD-10-CM

## 2015-05-21 DIAGNOSIS — E1121 Type 2 diabetes mellitus with diabetic nephropathy: Secondary | ICD-10-CM

## 2015-05-21 DIAGNOSIS — N529 Male erectile dysfunction, unspecified: Secondary | ICD-10-CM

## 2015-05-21 DIAGNOSIS — R059 Cough, unspecified: Secondary | ICD-10-CM

## 2015-05-21 LAB — GLUCOSE, POCT (MANUAL RESULT ENTRY): POC GLUCOSE: 202 mg/dL — AB (ref 70–99)

## 2015-05-21 LAB — POCT GLYCOSYLATED HEMOGLOBIN (HGB A1C): HEMOGLOBIN A1C: 9.8

## 2015-05-21 MED ORDER — OMEPRAZOLE 20 MG PO CPDR: 20.0000 mg | DELAYED_RELEASE_CAPSULE | Freq: Every day | ORAL | Status: DC

## 2015-05-21 MED ORDER — GLIPIZIDE ER 10 MG PO TB24: 10.0000 mg | ORAL_TABLET | Freq: Every day | ORAL | Status: DC

## 2015-05-21 MED ORDER — LISINOPRIL 10 MG PO TABS: 10.0000 mg | ORAL_TABLET | Freq: Every day | ORAL | Status: DC

## 2015-05-21 MED ORDER — AZITHROMYCIN 250 MG PO TABS: ORAL_TABLET | ORAL | Status: DC

## 2015-05-21 MED ORDER — METFORMIN HCL 850 MG PO TABS: 850.0000 mg | ORAL_TABLET | Freq: Two times a day (BID) | ORAL | Status: DC

## 2015-05-21 NOTE — Progress Notes (Signed)
Name: Jesse Lawrence.   MRN: [id number]    DOB: 21-Apr-1960   Date:05/21/2015       Progress Note  Subjective  Chief Complaint  Chief Complaint  Patient presents with  . Diabetes    3 month recheck  . Hypertension  . Hyperlipidemia    HPI  DM II : patient is currently out of Bydureon - it was too expensive and he had to stop medication . His wife quit her job to care for her mother, now she is now self employed. He has  high deductible ( $5000,00 ). His average glucose has been in the 200's since he ran our of medication. Highest of 402  low of 180's. He denies polyphagia, but has noticed polydipsia and polyuria . He occasionally has blurred vision.   HTN: He has been out of Lisinopril , and denies chest pain or SOB  Hyperlipidemia: on Atorvastatin daily , he has intermittent muscle cramps  GERD: taking Omeprazole otc instead of Pantoprazole because of cost, no heartburn or regurgitation when following a diet but when he splurges symptoms are present  ED: he has been out of Cialis because of cost  and it works well for him. Helps his start an erection but not always helps to maintain an erection. He has episodes of weak urinary stream, nocturia usually once per night, no hesitancy, no dribbling   CAD: s/p MI and stent placed taking plavix, also beta-blocker and statin therapy . No chest pain or decrease in exercise tolerance.    Patient Active Problem List   Diagnosis Date Noted  . Perennial allergic rhinitis 11/07/2014  . Arteriosclerosis of coronary artery 11/07/2014  . CD (contact dermatitis) 11/07/2014  . Decreased libido 11/07/2014  . Diabetes mellitus with renal manifestation ([place]) 11/07/2014  . Gastro-esophageal reflux disease without esophagitis 11/07/2014  . H/O acute myocardial infarction 11/07/2014  . Hypercholesteremia 11/07/2014  . Benign hypertension 11/07/2014  . Hypertriglyceridemia 11/07/2014  . H/O high risk medication treatment 11/07/2014  . NASH  (nonalcoholic steatohepatitis) 11/07/2014  . Microalbuminuria 11/07/2014  . Adult BMI 30+ 11/07/2014  . Fungal infection of toenail 11/07/2014  . Tobacco abuse 11/07/2014  . ED (erectile dysfunction) of organic origin 09/29/2006    Past Surgical History  Procedure Laterality Date  . Knee arthroscopy    . Cadiac stenting      Family History  Problem Relation Age of Onset  . Diabetes Mother   . CAD Mother   . Heart disease Mother   . Heart disease Father     Social History   Social History  . Marital Status: Married    Spouse Name: N/A  . Number of Children: N/A  . Years of Education: N/A   Occupational History  . Not on file.   Social History Main Topics  . Smoking status: Former Smoker -- 1.50 packs/day for 30 years    Types: Cigarettes    Quit date: 04/19/2013  . Smokeless tobacco: Current User  . Alcohol Use: No  . Drug Use: No  . Sexual Activity: Yes   Other Topics Concern  . Not on file   Social History Narrative     Current outpatient prescriptions:  .  atorvastatin (LIPITOR) 80 MG tablet, TAKE 1 TABLET (80 MG TOTAL) BY MOUTH DAILY., Disp: 90 tablet, Rfl: 0 .  clopidogrel (PLAVIX) 75 MG tablet, Take 1 tablet (75 mg total) by mouth daily., Disp: 90 tablet, Rfl: 1 .  glipiZIDE (GLUCOTROL XL) 10 MG 24  hr tablet, Take 1 tablet (10 mg total) by mouth daily with breakfast., Disp: 30 tablet, Rfl: 2 .  GLUCOSE BLOOD VI, , Disp: , Rfl:  .  lisinopril (PRINIVIL,ZESTRIL) 10 MG tablet, Take 1 tablet (10 mg total) by mouth daily., Disp: 90 tablet, Rfl: 1 .  loratadine (CLARITIN) 10 MG tablet, Take 1 tablet by mouth daily., Disp: , Rfl:  .  metFORMIN (GLUCOPHAGE) 850 MG tablet, Take 1-2 tablets (850-1,700 mg total) by mouth 2 (two) times daily with a meal., Disp: 90 tablet, Rfl: 2 .  metoprolol tartrate (LOPRESSOR) 25 MG tablet, TAKE 1 TABLET BY MOUTH TWICE DAILY, Disp: 180 tablet, Rfl: 0 .  tadalafil (CIALIS) 5 MG tablet, Take 1 tablet by mouth 1 day or 1 dose.,  Disp: , Rfl:  .  clonazePAM (KLONOPIN) 0.5 MG tablet, Take 1 tablet (0.5 mg total) by mouth 2 (two) times daily as needed for anxiety. (Patient not taking: Reported on 05/21/2015), Disp: 20 tablet, Rfl: 1 .  cyclobenzaprine (FLEXERIL) 10 MG tablet, Take 1 tablet (10 mg total) by mouth every 8 (eight) hours as needed for muscle spasms (PRN pain. Do not drive or operate heavy machinery while taking as can cause drowsiness.). (Patient not taking: Reported on 05/21/2015), Disp: 12 tablet, Rfl: 0 .  fluticasone (FLONASE) 50 MCG/ACT nasal spray, Place 2 sprays into both nostrils as needed. (Patient not taking: Reported on 05/21/2015), Disp: 48 g, Rfl: 1 .  omeprazole (PRILOSEC) 20 MG capsule, Take 1 capsule (20 mg total) by mouth daily., Disp: 30 capsule, Rfl: 3  No Known Allergies   ROS  Constitutional: Negative for fever or weight change.  Respiratory: Positive  for cough ( since URI two months ago ) and shortness of breath.   Cardiovascular: Negative for chest pain or palpitations.  Gastrointestinal: Negative for abdominal pain, no bowel changes.  Musculoskeletal: Negative for gait problem or joint swelling.  Skin: Negative for rash.  Neurological: Negative for dizziness or headache.  No other specific complaints in a complete review of systems (except as listed in HPI above).  Objective  Filed Vitals:   05/21/15 0919  BP: 132/80  Pulse: 87  Temp: 98.1 F (36.7 C)  TempSrc: Oral  Resp: 18  Height: 5' 8"  (1.727 m)  Weight: 237 lb 6.4 oz (107.684 kg)  SpO2: 94%    Body mass index is 36.1 kg/(m^2).  Physical Exam  Constitutional: Patient appears well-developed and well-nourished. Obese No distress.  HEENT: head atraumatic, normocephalic, pupils equal and reactive to light,  neck supple, throat within normal limits Cardiovascular: Normal rate, regular rhythm and normal heart sounds.  No murmur heard. No BLE edema. Pulmonary/Chest: Effort normal and breath sounds normal. No respiratory  distress. Abdominal: Soft.  There is no tenderness. Psychiatric: Patient has a normal mood and affect. behavior is normal. Judgment and thought content normal.  Recent Results (from the past 2160 hour(s))  POCT HgB A1C     Status: None   Collection Time: 05/21/15  9:23 AM  Result Value Ref Range   Hemoglobin A1C 9.8   POCT Glucose (CBG)     Status: Abnormal   Collection Time: 05/21/15  9:23 AM  Result Value Ref Range   POC Glucose 202 (A) 70 - 99 mg/dl    PHQ2/9: Depression screen [facility] 2/9 05/21/2015 02/16/2015 11/10/2014  Decreased Interest 0 0 0  Down, Depressed, Hopeless 0 0 0  PHQ - 2 Score 0 0 0     Fall Risk: Fall Risk  05/21/2015 02/16/2015 11/10/2014  Falls in the past year? No No No     Functional Status Survey: Is the patient deaf or have difficulty hearing?: No Does the patient have difficulty seeing, even when wearing glasses/contacts?: No Does the patient have difficulty concentrating, remembering, or making decisions?: No Does the patient have difficulty walking or climbing stairs?: No Does the patient have difficulty dressing or bathing?: No Does the patient have difficulty doing errands alone such as visiting a doctor's office or shopping?: No    Assessment & Plan  1. Diabetic nephropathy associated with type 2 diabetes mellitus ([place])  We will adjust medication, increase Glipizide from 5 to 10 mg, and metformin to 3 daily, also discussed importance of following his diet - POCT HgB A1C - POCT Glucose (CBG) - glipiZIDE (GLUCOTROL XL) 10 MG 24 hr tablet; Take 1 tablet (10 mg total) by mouth daily with breakfast.  Dispense: 30 tablet; Refill: 2 - metFORMIN (GLUCOPHAGE) 850 MG tablet; Take 1-2 tablets (850-1,700 mg total) by mouth 2 (two) times daily with a meal.  Dispense: 90 tablet; Refill: 2  2. Hypercholesteremia  Continue medication  3. Benign hypertension  - lisinopril (PRINIVIL,ZESTRIL) 10 MG tablet; Take 1 tablet (10 mg total) by mouth daily.   Dispense: 90 tablet; Refill: 1  4. Arteriosclerosis of coronary artery  Continue medication, doing well  - discussed follow up with cardiologist but he wants to hold off   5. ED (erectile dysfunction) of organic origin  He can't afford medication   6. NASH (nonalcoholic steatohepatitis)  Recheck labs next viist   7. Cough  He states he wants antibiotics, discussed spirometry and CXR but he refused - azithromycin (ZITHROMAX) 250 MG tablet; Take as directed  Dispense: 6 tablet; Refill: 0  8. OSA (obstructive sleep apnea)  Diagnosed about 5 years ago. He could not tolerate CPAP. We will get results from sleep med, explained risk of stroke and MI's without CPAP use

## 2015-05-31 ENCOUNTER — Other Ambulatory Visit: Payer: Self-pay | Admitting: Family Medicine

## 2015-05-31 NOTE — Telephone Encounter (Signed)
Patient requesting refill. 

## 2015-06-07 ENCOUNTER — Ambulatory Visit
Admission: EM | Admit: 2015-06-07 | Discharge: 2015-06-07 | Disposition: A | Discharge status: Home / Self Care | Payer: Managed Care, Other (non HMO) | Attending: Family Medicine | Admitting: Family Medicine

## 2015-06-07 ENCOUNTER — Encounter: Payer: Self-pay | Admitting: Emergency Medicine

## 2015-06-07 DIAGNOSIS — H6691 Otitis media, unspecified, right ear: Secondary | ICD-10-CM | POA: Diagnosis not present

## 2015-06-07 DIAGNOSIS — H9201 Otalgia, right ear: Secondary | ICD-10-CM | POA: Diagnosis not present

## 2015-06-07 HISTORY — DX: Essential (primary) hypertension: I10

## 2015-06-07 MED ORDER — AMOXICILLIN-POT CLAVULANATE 875-125 MG PO TABS: 1.0000 | ORAL_TABLET | Freq: Two times a day (BID) | ORAL | Status: DC

## 2015-06-07 NOTE — Discharge Instructions (Signed)
Take medication as prescribed. Rest. Drink plenty of fluids.   Follow up with your primary care physician this week as needed. Follow up with Ear, Nose and throat as needed.   Return to Urgent care for new or worsening concerns.    Otitis Media, Adult Otitis media is redness, soreness, and inflammation of the middle ear. Otitis media may be caused by allergies or, most commonly, by infection. Often it occurs as a complication of the common cold. SIGNS AND SYMPTOMS Symptoms of otitis media may include:  Earache.  Fever.  Ringing in your ear.  Headache.  Leakage of fluid from the ear. DIAGNOSIS To diagnose otitis media, your health care provider will examine your ear with an otoscope. This is an instrument that allows your health care provider to see into your ear in order to examine your eardrum. Your health care provider also will ask you questions about your symptoms. TREATMENT  Typically, otitis media resolves on its own within 3-5 days. Your health care provider may prescribe medicine to ease your symptoms of pain. If otitis media does not resolve within 5 days or is recurrent, your health care provider may prescribe antibiotic medicines if he or she suspects that a bacterial infection is the cause. HOME CARE INSTRUCTIONS   If you were prescribed an antibiotic medicine, finish it all even if you start to feel better.  Take medicines only as directed by your health care provider.  Keep all follow-up visits as directed by your health care provider. SEEK MEDICAL CARE IF:  You have otitis media only in one ear, or bleeding from your nose, or both.  You notice a lump on your neck.  You are not getting better in 3-5 days.  You feel worse instead of better. SEEK IMMEDIATE MEDICAL CARE IF:   You have pain that is not controlled with medicine.  You have swelling, redness, or pain around your ear or stiffness in your neck.  You notice that part of your face is  paralyzed.  You notice that the bone behind your ear (mastoid) is tender when you touch it. MAKE SURE YOU:   Understand these instructions.  Will watch your condition.  Will get help right away if you are not doing well or get worse.   This information is not intended to replace advice given to you by your health care provider. Make sure you discuss any questions you have with your health care provider.   Document Released: 12/28/2003 Document Revised: 04/14/2014 Document Reviewed: 10/19/2012 Elsevier Interactive Patient Education 2016 [place] An earache, also called otalgia, can be caused by many things. Pain from an earache can be sharp, dull, or burning. The pain may be temporary or constant. Earaches can be caused by problems with the ear, such as infection in either the middle ear or the ear canal, injury, impacted ear wax, middle ear pressure, or a foreign body in the ear. Ear pain can also result from problems in other areas. This is called referred pain. For example, pain can come from a sore throat, a tooth infection, or problems with the jaw or the joint between the jaw and the skull (temporomandibular joint, or TMJ). The cause of an earache is not always easy to identify. Watchful waiting may be appropriate for some earaches until a clear cause of the pain can be found. HOME CARE INSTRUCTIONS Watch your condition for any changes. The following actions may help to lessen any discomfort that you are feeling:  Take medicines only as directed by your health care provider. This includes ear drops.  Apply ice to your outer ear to help reduce pain.  Put ice in a plastic bag.  Place a towel between your skin and the bag.  Leave the ice on for 20 minutes, 2-3 times per day.  Do not put anything in your ear other than medicine that is prescribed by your health care provider.  Try resting in an upright position instead of lying down. This may help to reduce pressure  in the middle ear and relieve pain.  Chew gum if it helps to relieve your ear pain.  Control any allergies that you have.  Keep all follow-up visits as directed by your health care provider. This is important. SEEK MEDICAL CARE IF:  Your pain does not improve within 2 days.  You have a fever.  You have new or worsening symptoms. SEEK IMMEDIATE MEDICAL CARE IF:  You have a severe headache.  You have a stiff neck.  You have difficulty swallowing.  You have redness or swelling behind your ear.  You have drainage from your ear.  You have hearing loss.  You feel dizzy.   This information is not intended to replace advice given to you by your health care provider. Make sure you discuss any questions you have with your health care provider.   Document Released: 11/09/2003 Document Revised: 04/14/2014 Document Reviewed: 10/23/2013 Elsevier Interactive Patient Education [employer].

## 2015-06-07 NOTE — ED Notes (Signed)
Pt reports right ear pain and sore throat worsening since last night. Pt denies known fever reports feels right side facial pain as well

## 2015-06-07 NOTE — ED Provider Notes (Signed)
[facility]  ____________________________________________  Time seen: Approximately 8:55 AM  I have reviewed the triage vital signs and the nursing notes.   HISTORY  Chief Complaint Otalgia   HPI Jesse Lawrence. is a 55 y.o. male presents for complaints of right ear pain. Patient reports that he has had some mild right ear pain over the last 3-4 days but has had increased right ear pain since yesterday. Patient reports a the last 2 months he has had had lot of runny nose and nasal congestion. Patient states that usually ha seasonal  allergies are not usually this early. Patient states that he ended up having a sinus infection approximately 1 month ago that was treated with oral azithromycin by his primary care physician. Patient reports that he continues with frequent nasal congestion. Reports has been using over-the-counter Claritin as well as Flonase which helps.  Denies hearing deficits. Denies fall or trauma. Denies ringing in his ears or tinnitus. Denies ear drainage or discharge. Denies facial swelling or swelling around his ear. Denies dental pain. Reports continues to eat and drink well.  Denies chest pain, shortness of breath, dizziness, weakness, neck or back pain.  PCP: Ancil Boozer  Past Medical History  Diagnosis Date  . Male impotence   . Hyperlipidemia   . Allergy   . COPD (chronic obstructive pulmonary disease) ([place])   . Diabetes mellitus without complication ([place])   . Anxiety   . Obesity   . Hypertension     Patient Active Problem List   Diagnosis Date Noted  . OSA (obstructive sleep apnea) 05/21/2015  . Perennial allergic rhinitis 11/07/2014  . Arteriosclerosis of coronary artery 11/07/2014  . CD (contact dermatitis) 11/07/2014  . Decreased libido 11/07/2014  . Diabetes mellitus with renal manifestation ([place]) 11/07/2014  . Gastro-esophageal reflux disease without esophagitis 11/07/2014  . H/O acute myocardial infarction 11/07/2014  .  Hypercholesteremia 11/07/2014  . Benign hypertension 11/07/2014  . Hypertriglyceridemia 11/07/2014  . H/O high risk medication treatment 11/07/2014  . NASH (nonalcoholic steatohepatitis) 11/07/2014  . Microalbuminuria 11/07/2014  . Adult BMI 30+ 11/07/2014  . Fungal infection of toenail 11/07/2014  . Tobacco abuse 11/07/2014  . ED (erectile dysfunction) of organic origin 09/29/2006    Past Surgical History  Procedure Laterality Date  . Knee arthroscopy    . Cadiac stenting      Current Outpatient Rx  Name  Route  Sig  Dispense  Refill  . atorvastatin (LIPITOR) 80 MG tablet      TAKE 1 TABLET (80 MG TOTAL) BY MOUTH DAILY.   90 tablet   0   .           . BREO ELLIPTA 100-25 MCG/INH AEPB      INHALE 1 PUFF INTO THE LUNGS DAILY.   60 each   0   . clonazePAM (KLONOPIN) 0.5 MG tablet   Oral   Take 1 tablet (0.5 mg total) by mouth 2 (two) times daily as needed for anxiety. Patient not taking: Reported on 05/21/2015   20 tablet   1   . clopidogrel (PLAVIX) 75 MG tablet   Oral   Take 1 tablet (75 mg total) by mouth daily.   90 tablet   1   . cyclobenzaprine (FLEXERIL) 10 MG tablet   Oral   Take 1 tablet (10 mg total) by mouth every 8 (eight) hours as needed for muscle spasms (PRN pain. Do not drive or operate heavy machinery while taking as can cause drowsiness.).  Patient not taking: Reported on 05/21/2015   12 tablet   0   . fluticasone (FLONASE) 50 MCG/ACT nasal spray   Each Nare   Place 2 sprays into both nostrils as needed. Patient not taking: Reported on 05/21/2015   48 g   1   . glipiZIDE (GLUCOTROL XL) 10 MG 24 hr tablet   Oral   Take 1 tablet (10 mg total) by mouth daily with breakfast.   30 tablet   2   . GLUCOSE BLOOD VI               . lisinopril (PRINIVIL,ZESTRIL) 10 MG tablet   Oral   Take 1 tablet (10 mg total) by mouth daily.   90 tablet   1   . loratadine (CLARITIN) 10 MG tablet   Oral   Take 1 tablet by mouth daily.         .  metFORMIN (GLUCOPHAGE) 850 MG tablet   Oral   Take 1-2 tablets (850-1,700 mg total) by mouth 2 (two) times daily with a meal.   90 tablet   2   . metoprolol tartrate (LOPRESSOR) 25 MG tablet      TAKE 1 TABLET BY MOUTH TWICE DAILY   180 tablet   0   . omeprazole (PRILOSEC) 20 MG capsule   Oral   Take 1 capsule (20 mg total) by mouth daily.   30 capsule   3   . tadalafil (CIALIS) 5 MG tablet   Oral   Take 1 tablet by mouth 1 day or 1 dose.           Allergies Review of patient's allergies indicates no known allergies.  Family History  Problem Relation Age of Onset  . Diabetes Mother   . CAD Mother   . Heart disease Mother   . Heart disease Father     Social History Social History  Substance Use Topics  . Smoking status: Former Smoker -- 1.50 packs/day for 30 years    Types: Cigarettes    Quit date: 04/19/2013  . Smokeless tobacco: Current User  . Alcohol Use: No    Review of Systems Constitutional: No fever/chills Eyes: No visual changes. ENT:  Positive runny nose, nasal congestion and right ear pain. Cardiovascular: Denies chest pain. Respiratory: Denies shortness of breath. Gastrointestinal: No abdominal pain.  No nausea, no vomiting.  No diarrhea.  No constipation. Genitourinary: Negative for dysuria. Musculoskeletal: Negative for back pain. Skin: Negative for rash. Neurological: Negative for headaches, focal weakness or numbness.  10-point ROS otherwise negative.  ____________________________________________   PHYSICAL EXAM:  VITAL SIGNS: ED Triage Vitals  Enc Vitals Group     BP 06/07/15 0837 137/89 mmHg     Pulse Rate 06/07/15 0837 86     Resp 06/07/15 0837 16     Temp 06/07/15 0837 97.5 F (36.4 C)     Temp Source 06/07/15 0837 Tympanic     SpO2 06/07/15 0837 97 %     Weight 06/07/15 0837 238 lb (107.956 kg)     Height 06/07/15 0837 5' 8"  (1.727 m)     Head Cir --      Peak Flow --      Pain Score 06/07/15 0838 4     Pain Loc --       Pain Edu? --      Excl. in [place]? --    Constitutional: Alert and oriented. Well appearing and in no acute distress. Eyes: Conjunctivae are  normal. PERRL. EOMI. Head: Atraumatic.Mild tenderness to palpation bilateral frontal and maxillary sinuses. No swelling. No erythema.   Ears: Left: Mild dullness, no erythema, normal TM otherwise. No exudate or drainage. Nontender. Right: Mild tenderness palpation, mild to moderate erythema, dullness, TM appears intact, no exudate or drainage. No surrounding erythema or swelling. No facial or neck induration,swelling, erythema or fluctuance. No facial swelling. No TMJ tenderness bilaterally. Hearing grossly intact bilaterally.   Nose: nasal congestion with bilateral nasal turbinate erythema and edema.   Mouth/Throat: Mucous membranes are moist.  Oropharynx non-erythematous.No tonsillar swelling or exudate.  Neck: No stridor.  No cervical spine tenderness to palpation. Hematological/Lymphatic/Immunilogical: No cervical lymphadenopathy. Cardiovascular: Normal rate, regular rhythm. Grossly normal heart sounds.  Good peripheral circulation. Respiratory: Normal respiratory effort.  No retractions. Lungs CTAB. No wheezes, rales or rhonchi. Good air movement.  Gastrointestinal: Soft and nontender. No distention. Normal Bowel sounds. No CVA tenderness. Musculoskeletal: No lower or upper extremity tenderness nor edema.  Bilateral pedal pulses equal and easily palpated. No cervical, thoracic or lumbar tenderness to palpation.  Neurologic:  Normal speech and language. No gross focal neurologic deficits are appreciated. No gait instability. Skin:  Skin is warm, dry and intact. No rash noted. Psychiatric: Mood and affect are normal. Speech and behavior are normal.  ____________________________________________   LABS (all labs ordered are listed, but only abnormal results are displayed)  Labs Reviewed - No data to display   INITIAL IMPRESSION / ASSESSMENT AND PLAN  / ED COURSE  Pertinent labs & imaging results that were available during my care of the patient were reviewed by me and considered in my medical decision making (see chart for details).  Very well-appearing patient. No acute distress. Presents for the complaints of a few days a gradually increasing right ear pain. Reports continued nasal congestion and sinus drainage over the last several weeks even after azithromycin course for sinus infection by his primary care physician. Reports seasonal allergies and taking Claritin and Flonase which has helped. Lungs clear throughout. Abdomen soft and nontender. Right otitis media and suspect continuation of mild sinusitis. Encouraged continuation of Claritin and Flonase. Will treat otitis media with oral Augmentin to also have sinus coverage as well. Encouraged rest, fluids and PCP follow-up. ENT information also given.   Discussed follow up with Primary care physician this week. Discussed follow up and return parameters including no resolution or any worsening concerns. Patient verbalized understanding and agreed to plan.   ____________________________________________   FINAL CLINICAL IMPRESSION(S) / ED DIAGNOSES  Final diagnoses:  Acute right otitis media, recurrence not specified, unspecified otitis media type  Otalgia, right      Note: This dictation was prepared with Dragon dictation along with smaller phrase technology. Any transcriptional errors that result from this process are unintentional.    Marylene Land, NP 06/07/15 [phone]

## 2015-06-09 ENCOUNTER — Other Ambulatory Visit: Payer: Self-pay | Admitting: Family Medicine

## 2015-06-15 ENCOUNTER — Other Ambulatory Visit: Payer: Self-pay | Admitting: Family Medicine

## 2015-06-15 NOTE — Telephone Encounter (Signed)
Patient requesting refill. 

## 2015-06-21 ENCOUNTER — Telehealth: Payer: Self-pay | Admitting: Family Medicine

## 2015-06-21 NOTE — Telephone Encounter (Signed)
Pt needs test strips to be called into [facility].

## 2015-06-24 ENCOUNTER — Other Ambulatory Visit: Payer: Self-pay | Admitting: Family Medicine

## 2015-07-04 ENCOUNTER — Encounter: Payer: Self-pay | Admitting: Family Medicine

## 2015-07-04 ENCOUNTER — Ambulatory Visit (INDEPENDENT_AMBULATORY_CARE_PROVIDER_SITE_OTHER): Payer: Managed Care, Other (non HMO) | Admitting: Family Medicine

## 2015-07-04 ENCOUNTER — Telehealth: Payer: Self-pay | Admitting: Family Medicine

## 2015-07-04 VITALS — BP 124/72 | HR 86 | Temp 98.3°F | Resp 16 | Wt 235.0 lb

## 2015-07-04 DIAGNOSIS — E1121 Type 2 diabetes mellitus with diabetic nephropathy: Secondary | ICD-10-CM

## 2015-07-04 DIAGNOSIS — I10 Essential (primary) hypertension: Secondary | ICD-10-CM

## 2015-07-04 DIAGNOSIS — K219 Gastro-esophageal reflux disease without esophagitis: Secondary | ICD-10-CM

## 2015-07-04 DIAGNOSIS — I2510 Atherosclerotic heart disease of native coronary artery without angina pectoris: Secondary | ICD-10-CM

## 2015-07-04 MED ORDER — GLIPIZIDE ER 10 MG PO TB24: 10.0000 mg | ORAL_TABLET | Freq: Every day | ORAL | Status: DC

## 2015-07-04 MED ORDER — RANITIDINE HCL 300 MG PO TABS: 300.0000 mg | ORAL_TABLET | Freq: Two times a day (BID) | ORAL | Status: DC

## 2015-07-04 MED ORDER — LISINOPRIL 10 MG PO TABS: 10.0000 mg | ORAL_TABLET | Freq: Every day | ORAL | Status: DC

## 2015-07-04 MED ORDER — METOPROLOL TARTRATE 25 MG PO TABS: 25.0000 mg | ORAL_TABLET | Freq: Two times a day (BID) | ORAL | Status: DC

## 2015-07-04 NOTE — Progress Notes (Signed)
Name: Jesse Lawrence.   MRN: [id number]    DOB: 06-Jun-1960   Date:07/04/2015       Progress Note  Subjective  Chief Complaint  Chief Complaint  Patient presents with  . Diabetes    HPI   DM II : He is here for a 6 weeks DM follow up. We had to change his medication because of insurance changes.  His 7 day  Average 197, 14 day average 197, and one month average 243 - he states he has another meter at home also and glucose at work seems to be better controlled.  Glucose has been in Highest 250's on Sunday after homecoming at his church.  He denies polyphagia, only occasionally has polydipsia or polyuria. No longer has blurred vision. He has been compliant with the generic medication and denies side effects of medications  HTN/CAD: no chest pain, no palpitation, bp is at goal, no side effects of medication  GERD: he stopped OMeprazola, taking Ranitidine otc, but still has heartburn, no regurgitation, advised to increase dose to twice daily   Patient Active Problem List   Diagnosis Date Noted  . OSA (obstructive sleep apnea) 05/21/2015  . Perennial allergic rhinitis 11/07/2014  . Arteriosclerosis of coronary artery 11/07/2014  . CD (contact dermatitis) 11/07/2014  . Decreased libido 11/07/2014  . Diabetes mellitus with renal manifestation ([place]) 11/07/2014  . Gastro-esophageal reflux disease without esophagitis 11/07/2014  . H/O acute myocardial infarction 11/07/2014  . Hypercholesteremia 11/07/2014  . Benign hypertension 11/07/2014  . Hypertriglyceridemia 11/07/2014  . H/O high risk medication treatment 11/07/2014  . NASH (nonalcoholic steatohepatitis) 11/07/2014  . Microalbuminuria 11/07/2014  . Adult BMI 30+ 11/07/2014  . Fungal infection of toenail 11/07/2014  . Tobacco abuse 11/07/2014  . ED (erectile dysfunction) of organic origin 09/29/2006    Past Surgical History  Procedure Laterality Date  . Knee arthroscopy    . Cadiac stenting      Family History  Problem  Relation Age of Onset  . Diabetes Mother   . CAD Mother   . Heart disease Mother   . Heart disease Father     Social History   Social History  . Marital Status: Married    Spouse Name: N/A  . Number of Children: N/A  . Years of Education: N/A   Occupational History  . Not on file.   Social History Main Topics  . Smoking status: Former Smoker -- 1.50 packs/day for 30 years    Types: Cigarettes    Quit date: 04/19/2013  . Smokeless tobacco: Current User  . Alcohol Use: No  . Drug Use: No  . Sexual Activity: Yes   Other Topics Concern  . Not on file   Social History Narrative     Current outpatient prescriptions:  .  atorvastatin (LIPITOR) 80 MG tablet, TAKE 1 TABLET (80 MG TOTAL) BY MOUTH DAILY., Disp: 90 tablet, Rfl: 0 .  clopidogrel (PLAVIX) 75 MG tablet, TAKE 1 TABLET BY MOUTH DAILY, Disp: 90 tablet, Rfl: 1 .  fluticasone (FLONASE) 50 MCG/ACT nasal spray, Place 2 sprays into both nostrils as needed., Disp: 48 g, Rfl: 1 .  glipiZIDE (GLUCOTROL XL) 10 MG 24 hr tablet, Take 1 tablet (10 mg total) by mouth daily with breakfast., Disp: 90 tablet, Rfl: 0 .  GLUCOSE BLOOD VI, , Disp: , Rfl:  .  lisinopril (PRINIVIL,ZESTRIL) 10 MG tablet, Take 1 tablet (10 mg total) by mouth daily., Disp: 90 tablet, Rfl: 1 .  loratadine (CLARITIN) 10  MG tablet, Take 1 tablet by mouth daily., Disp: , Rfl:  .  metFORMIN (GLUCOPHAGE) 850 MG tablet, Take 1-2 tablets (850-1,700 mg total) by mouth 2 (two) times daily with a meal., Disp: 90 tablet, Rfl: 2 .  metoprolol tartrate (LOPRESSOR) 25 MG tablet, Take 1 tablet (25 mg total) by mouth 2 (two) times daily., Disp: 180 tablet, Rfl: 1 .  tadalafil (CIALIS) 5 MG tablet, Take 1 tablet by mouth 1 day or 1 dose., Disp: , Rfl:  .  clonazePAM (KLONOPIN) 0.5 MG tablet, Take 1 tablet (0.5 mg total) by mouth 2 (two) times daily as needed for anxiety. (Patient not taking: Reported on 05/21/2015), Disp: 20 tablet, Rfl: 1  No Known Allergies   ROS  Ten  systems reviewed and is negative except as mentioned in HPI   Objective  Filed Vitals:   07/04/15 0843  BP: 124/72  Pulse: 86  Temp: 98.3 F (36.8 C)  TempSrc: Oral  Resp: 16  Weight: 235 lb (106.595 kg)  SpO2: 97%    Body mass index is 35.74 kg/(m^2).  Physical Exam  Constitutional: Patient appears well-developed and well-nourished. Obese  No distress.  HEENT: head atraumatic, normocephalic, pupils equal and reactive to light, neck supple, TM shows small hole on right TM throat within normal limits Cardiovascular: Normal rate, regular rhythm and normal heart sounds.  No murmur heard. No BLE edema. Pulmonary/Chest: Effort normal and breath sounds normal. No respiratory distress. Abdominal: Soft.  There is no tenderness. Psychiatric: Patient has a normal mood and affect. behavior is normal. Judgment and thought content normal.  Recent Results (from the past 2160 hour(s))  POCT HgB A1C     Status: None   Collection Time: 05/21/15  9:23 AM  Result Value Ref Range   Hemoglobin A1C 9.8   POCT Glucose (CBG)     Status: Abnormal   Collection Time: 05/21/15  9:23 AM  Result Value Ref Range   POC Glucose 202 (A) 70 - 99 mg/dl     PHQ2/9: Depression screen [facility] 2/9 07/04/2015 05/21/2015 02/16/2015 11/10/2014  Decreased Interest 0 0 0 0  Down, Depressed, Hopeless 0 0 0 0  PHQ - 2 Score 0 0 0 0    Fall Risk: Fall Risk  07/04/2015 05/21/2015 02/16/2015 11/10/2014  Falls in the past year? No No No No    Functional Status Survey: Is the patient deaf or have difficulty hearing?: No Does the patient have difficulty seeing, even when wearing glasses/contacts?: No Does the patient have difficulty concentrating, remembering, or making decisions?: No Does the patient have difficulty walking or climbing stairs?: No Does the patient have difficulty dressing or bathing?: No Does the patient have difficulty doing errands alone such as visiting a doctor's office or shopping?:  No    Assessment & Plan  1. Diabetic nephropathy associated with type 2 diabetes mellitus ([place])  Explained importance of compliance with his diet also.  - glipiZIDE (GLUCOTROL XL) 10 MG 24 hr tablet; Take 1 tablet (10 mg total) by mouth daily with breakfast.  Dispense: 90 tablet; Refill: 0  2. Benign hypertension  Well controlled, continue medication, he is currently not sure if he is taking ace, explained it also protects his kidney - metoprolol tartrate (LOPRESSOR) 25 MG tablet; Take 1 tablet (25 mg total) by mouth 2 (two) times daily.  Dispense: 180 tablet; Refill: 1 - lisinopril (PRINIVIL,ZESTRIL) 10 MG tablet; Take 1 tablet (10 mg total) by mouth daily.  Dispense: 90 tablet; Refill: 1  3. Arteriosclerosis of coronary artery  - metoprolol tartrate (LOPRESSOR) 25 MG tablet; Take 1 tablet (25 mg total) by mouth 2 (two) times daily.  Dispense: 180 tablet; Refill: 1   4. Gastro-esophageal reflux disease without esophagitis  - ranitidine (ZANTAC) 300 MG tablet; Take 1 tablet (300 mg total) by mouth 2 (two) times daily.  Dispense: 60 tablet; Refill: 2

## 2015-07-04 NOTE — Telephone Encounter (Signed)
Jesse Lawrence from [facility] states that Zantac was called in for 354m 2 times per day, but the max is 3044mper day. Pleas send in new script

## 2015-07-04 NOTE — Telephone Encounter (Signed)
He can take twice daily

## 2015-08-17 ENCOUNTER — Encounter: Payer: Self-pay | Admitting: Family Medicine

## 2015-08-17 ENCOUNTER — Ambulatory Visit (INDEPENDENT_AMBULATORY_CARE_PROVIDER_SITE_OTHER): Payer: Managed Care, Other (non HMO) | Admitting: Family Medicine

## 2015-08-17 VITALS — BP 126/78 | HR 81 | Temp 98.5°F | Resp 16 | Ht 68.0 in | Wt 236.3 lb

## 2015-08-17 DIAGNOSIS — E7800 Pure hypercholesterolemia, unspecified: Secondary | ICD-10-CM

## 2015-08-17 DIAGNOSIS — G4733 Obstructive sleep apnea (adult) (pediatric): Secondary | ICD-10-CM

## 2015-08-17 DIAGNOSIS — I10 Essential (primary) hypertension: Secondary | ICD-10-CM

## 2015-08-17 DIAGNOSIS — K7581 Nonalcoholic steatohepatitis (NASH): Secondary | ICD-10-CM | POA: Diagnosis not present

## 2015-08-17 DIAGNOSIS — F40243 Fear of flying: Secondary | ICD-10-CM | POA: Diagnosis not present

## 2015-08-17 DIAGNOSIS — N529 Male erectile dysfunction, unspecified: Secondary | ICD-10-CM

## 2015-08-17 DIAGNOSIS — E1121 Type 2 diabetes mellitus with diabetic nephropathy: Secondary | ICD-10-CM

## 2015-08-17 DIAGNOSIS — I2510 Atherosclerotic heart disease of native coronary artery without angina pectoris: Secondary | ICD-10-CM

## 2015-08-17 DIAGNOSIS — N528 Other male erectile dysfunction: Secondary | ICD-10-CM

## 2015-08-17 DIAGNOSIS — K219 Gastro-esophageal reflux disease without esophagitis: Secondary | ICD-10-CM | POA: Diagnosis not present

## 2015-08-17 LAB — POCT UA - MICROALBUMIN: MICROALBUMIN (UR) POC: 100 mg/L

## 2015-08-17 LAB — POCT GLYCOSYLATED HEMOGLOBIN (HGB A1C): HEMOGLOBIN A1C: 7.9

## 2015-08-17 MED ORDER — TADALAFIL 5 MG PO TABS
5.0000 mg | ORAL_TABLET | Freq: Every day | ORAL | Status: DC
Start: 2015-08-17 — End: 2015-11-16

## 2015-08-17 MED ORDER — GLUCOSE BLOOD VI STRP: ORAL_STRIP | Status: DC

## 2015-08-17 MED ORDER — ATORVASTATIN CALCIUM 80 MG PO TABS: 80.0000 mg | ORAL_TABLET | Freq: Every day | ORAL | Status: DC

## 2015-08-17 MED ORDER — METFORMIN HCL 850 MG PO TABS: 850.0000 mg | ORAL_TABLET | Freq: Two times a day (BID) | ORAL | Status: DC

## 2015-08-17 MED ORDER — GLIPIZIDE ER 10 MG PO TB24: 10.0000 mg | ORAL_TABLET | Freq: Every day | ORAL | Status: DC

## 2015-08-17 NOTE — Progress Notes (Signed)
Name: Jesse Lawrence.   MRN: [id number]    DOB: Sep 24, 1960   Date:08/17/2015       Progress Note  Subjective  Chief Complaint  Chief Complaint  Patient presents with  . Follow-up    patient is here for 6-week f/u on diabetic nephropathy.  . Medication Refill    diabetic test strips    HPI  DM II : He is here for a 6 weeks DM follow up.  His 7 day Average 128, , and one month average 121. Glucose has been in Highest 320 after he ate spaghetti.  He denies polyphagia, only occasionally has polydipsia or polyuria. No blurred vision. He has been compliant with the generic medication and denies side effects of medications. His hgbA1C has improved from 9.8% to 7.9%. Advised to continue the good work, goal is to have hgbA1C below 7. He had one episode of hypoglycemia yesterday ( he skipped lunch )  HTN/CAD: no chest pain, no palpitation, bp is at goal, no side effects of medication  GERD: he stopped OMeprazola, taking Ranitidine otc, symptoms have improved, only triggered by dietary indiscretion, otherwise under good control. No heartburn or regurgitation at this time  ED: he has difficulty maintaining and erection, uses Cialis prn. No side effects   OSA: he has not been compliant with CPAP machine, explained he needs to   Patient Active Problem List   Diagnosis Date Noted  . OSA (obstructive sleep apnea) 05/21/2015  . Perennial allergic rhinitis 11/07/2014  . Arteriosclerosis of coronary artery 11/07/2014  . CD (contact dermatitis) 11/07/2014  . Decreased libido 11/07/2014  . Diabetes mellitus with renal manifestation ([place]) 11/07/2014  . Gastro-esophageal reflux disease without esophagitis 11/07/2014  . H/O acute myocardial infarction 11/07/2014  . Hypercholesteremia 11/07/2014  . Benign hypertension 11/07/2014  . Hypertriglyceridemia 11/07/2014  . H/O high risk medication treatment 11/07/2014  . NASH (nonalcoholic steatohepatitis) 11/07/2014  . Microalbuminuria 11/07/2014   . Adult BMI 30+ 11/07/2014  . Fungal infection of toenail 11/07/2014  . Tobacco abuse 11/07/2014  . ED (erectile dysfunction) of organic origin 09/29/2006    Past Surgical History  Procedure Laterality Date  . Knee arthroscopy    . Cadiac stenting      Family History  Problem Relation Age of Onset  . Diabetes Mother   . CAD Mother   . Heart disease Mother   . Heart disease Father     Social History   Social History  . Marital Status: Married    Spouse Name: N/A  . Number of Children: N/A  . Years of Education: N/A   Occupational History  . Not on file.   Social History Main Topics  . Smoking status: Former Smoker -- 1.50 packs/day for 30 years    Types: Cigarettes    Quit date: 04/19/2013  . Smokeless tobacco: Current User  . Alcohol Use: No  . Drug Use: No  . Sexual Activity: Yes   Other Topics Concern  . Not on file   Social History Narrative     Current outpatient prescriptions:  .  atorvastatin (LIPITOR) 80 MG tablet, TAKE 1 TABLET (80 MG TOTAL) BY MOUTH DAILY., Disp: 90 tablet, Rfl: 0 .  clonazePAM (KLONOPIN) 0.5 MG tablet, Take 1 tablet (0.5 mg total) by mouth 2 (two) times daily as needed for anxiety. (Patient not taking: Reported on 05/21/2015), Disp: 20 tablet, Rfl: 1 .  clopidogrel (PLAVIX) 75 MG tablet, TAKE 1 TABLET BY MOUTH DAILY, Disp: 90 tablet, Rfl:  1 .  fluticasone (FLONASE) 50 MCG/ACT nasal spray, Place 2 sprays into both nostrils as needed., Disp: 48 g, Rfl: 1 .  glipiZIDE (GLUCOTROL XL) 10 MG 24 hr tablet, Take 1 tablet (10 mg total) by mouth daily with breakfast., Disp: 90 tablet, Rfl: 0 .  GLUCOSE BLOOD VI, , Disp: , Rfl:  .  lisinopril (PRINIVIL,ZESTRIL) 10 MG tablet, Take 1 tablet (10 mg total) by mouth daily., Disp: 90 tablet, Rfl: 1 .  loratadine (CLARITIN) 10 MG tablet, Take 1 tablet by mouth daily., Disp: , Rfl:  .  metFORMIN (GLUCOPHAGE) 850 MG tablet, Take 1-2 tablets (850-1,700 mg total) by mouth 2 (two) times daily with a meal.,  Disp: 90 tablet, Rfl: 2 .  metoprolol tartrate (LOPRESSOR) 25 MG tablet, Take 1 tablet (25 mg total) by mouth 2 (two) times daily., Disp: 180 tablet, Rfl: 1 .  ranitidine (ZANTAC) 300 MG tablet, Take 1 tablet (300 mg total) by mouth 2 (two) times daily., Disp: 60 tablet, Rfl: 2 .  tadalafil (CIALIS) 5 MG tablet, Take 1 tablet by mouth 1 day or 1 dose., Disp: , Rfl:   No Known Allergies   ROS  Constitutional: Negative for fever or weight change.  Respiratory: Negative for cough and shortness of breath.   Cardiovascular: Negative for chest pain , occasionally has  palpitations.  Gastrointestinal: Negative for abdominal pain, no bowel changes.  Musculoskeletal: Negative for gait problem or joint swelling.  Skin: Negative for rash.  Neurological: Negative for dizziness or headache.  No other specific complaints in a complete review of systems (except as listed in HPI above).   Objective  Filed Vitals:   08/17/15 0847  BP: 126/78  Pulse: 81  Temp: 98.5 F (36.9 C)  TempSrc: Oral  Resp: 16  Height: 5' 8"  (1.727 m)  Weight: 236 lb 4.8 oz (107.185 kg)  SpO2: 96%    Body mass index is 35.94 kg/(m^2).  Physical Exam  Constitutional: Patient appears well-developed and well-nourished. Obese  No distress.  HEENT: head atraumatic, normocephalic, pupils equal and reactive to light,neck supple, throat within normal limits Cardiovascular: Normal rate, regular rhythm and normal heart sounds.  No murmur heard. No BLE edema. Pulmonary/Chest: Effort normal and breath sounds normal. No respiratory distress. Abdominal: Soft.  There is no tenderness. Psychiatric: Patient has a normal mood and affect. behavior is normal. Judgment and thought content normal.  Recent Results (from the past 2160 hour(s))  POCT HgB A1C     Status: None   Collection Time: 05/21/15  9:23 AM  Result Value Ref Range   Hemoglobin A1C 9.8   POCT Glucose (CBG)     Status: Abnormal   Collection Time: 05/21/15  9:23 AM   Result Value Ref Range   POC Glucose 202 (A) 70 - 99 mg/dl  POCT glycosylated hemoglobin (Hb A1C)     Status: Abnormal   Collection Time: 08/17/15  8:58 AM  Result Value Ref Range   Hemoglobin A1C 7.9     Comment: Patient's last A1c was 9.8     PHQ2/9: Depression screen [facility] 2/9 08/17/2015 07/04/2015 05/21/2015 02/16/2015 11/10/2014  Decreased Interest 0 0 0 0 0  Down, Depressed, Hopeless 0 0 0 0 0  PHQ - 2 Score 0 0 0 0 0    Fall Risk: Fall Risk  08/17/2015 07/04/2015 05/21/2015 02/16/2015 11/10/2014  Falls in the past year? No No No No No    Functional Status Survey: Is the patient deaf or have difficulty hearing?:  No Does the patient have difficulty seeing, even when wearing glasses/contacts?: No Does the patient have difficulty concentrating, remembering, or making decisions?: No Does the patient have difficulty walking or climbing stairs?: No Does the patient have difficulty dressing or bathing?: No Does the patient have difficulty doing errands alone such as visiting a doctor's office or shopping?: No    Assessment & Plan  1. Type 2 diabetes mellitus with diabetic nephropathy, without long-term current use of insulin (HCC)  - POCT glycosylated hemoglobin (Hb A1C) - POCT UA - Microalbumin  2. Arteriosclerosis of coronary artery  Continue Plavix, lisinopril   3. Benign hypertension  At goal   4. Gastro-esophageal reflux disease without esophagitis  Controlled   5. Hypercholesteremia  Continue Lipitor   6. OSA (obstructive sleep apnea)  He has not been using his CPAP machine, explained increase risk of heart attack in strokes    7. NASH (nonalcoholic steatohepatitis)  Recheck labs   8. Fear of flying  He takes Clonazepam prn when he needs to fly for work

## 2015-08-18 LAB — COMPREHENSIVE METABOLIC PANEL WITH GFR
ALT: 51 IU/L — ABNORMAL HIGH (ref 0–44)
AST: 29 IU/L (ref 0–40)
Albumin/Globulin Ratio: 2 (ref 1.2–2.2)
Albumin: 4.7 g/dL (ref 3.5–5.5)
Alkaline Phosphatase: 81 IU/L (ref 39–117)
BUN/Creatinine Ratio: 19 (ref 9–20)
BUN: 15 mg/dL (ref 6–24)
Bilirubin Total: 1 mg/dL (ref 0.0–1.2)
CO2: 23 mmol/L (ref 18–29)
Calcium: 9.6 mg/dL (ref 8.7–10.2)
Chloride: 95 mmol/L — ABNORMAL LOW (ref 96–106)
Creatinine, Ser: 0.77 mg/dL (ref 0.76–1.27)
GFR calc Af Amer: 121 mL/min/1.73
GFR calc non Af Amer: 104 mL/min/1.73
Globulin, Total: 2.4 g/dL (ref 1.5–4.5)
Glucose: 107 mg/dL — ABNORMAL HIGH (ref 65–99)
Potassium: 5.1 mmol/L (ref 3.5–5.2)
Sodium: 135 mmol/L (ref 134–144)
Total Protein: 7.1 g/dL (ref 6.0–8.5)

## 2015-08-18 LAB — LIPID PANEL
Chol/HDL Ratio: 6.3 ratio — ABNORMAL HIGH (ref 0.0–5.0)
Cholesterol, Total: 150 mg/dL (ref 100–199)
HDL: 24 mg/dL — ABNORMAL LOW
Triglycerides: 453 mg/dL — ABNORMAL HIGH (ref 0–149)

## 2015-08-20 ENCOUNTER — Telehealth: Payer: Self-pay

## 2015-08-20 ENCOUNTER — Other Ambulatory Visit: Payer: Self-pay | Admitting: Family Medicine

## 2015-08-20 DIAGNOSIS — E781 Pure hyperglyceridemia: Secondary | ICD-10-CM

## 2015-08-20 MED ORDER — ICOSAPENT ETHYL 1 G PO CAPS: 2.0000 g | ORAL_CAPSULE | Freq: Two times a day (BID) | ORAL | Status: DC

## 2015-08-20 NOTE — Telephone Encounter (Signed)
Left message for patient to return my call regarding labs.

## 2015-08-20 NOTE — Telephone Encounter (Signed)
-----   Message from Steele Sizer, MD sent at 08/19/2015 10:15 PM EDT ----- Lipid panel does not look good. Triglycerides is even higher. And HDL is still very low. Not sure how his insurance works, but we have voucher for [employer]. He still need to increase exercise, avoid fried food, eat more fruit and vegetable. Add weekly fish and 1 - 2 ounces of tree nuts to his diet daily  Fasting glucose is at goal for him, normal kidney function, liver enzyme has improved

## 2015-11-16 ENCOUNTER — Ambulatory Visit (INDEPENDENT_AMBULATORY_CARE_PROVIDER_SITE_OTHER): Payer: Managed Care, Other (non HMO) | Admitting: Family Medicine

## 2015-11-16 ENCOUNTER — Encounter: Payer: Self-pay | Admitting: Family Medicine

## 2015-11-16 VITALS — BP 130/60 | HR 88 | Temp 98.0°F | Resp 18 | Ht 68.0 in | Wt 241.7 lb

## 2015-11-16 DIAGNOSIS — I10 Essential (primary) hypertension: Secondary | ICD-10-CM

## 2015-11-16 DIAGNOSIS — E781 Pure hyperglyceridemia: Secondary | ICD-10-CM

## 2015-11-16 DIAGNOSIS — I2510 Atherosclerotic heart disease of native coronary artery without angina pectoris: Secondary | ICD-10-CM | POA: Diagnosis not present

## 2015-11-16 DIAGNOSIS — N529 Male erectile dysfunction, unspecified: Secondary | ICD-10-CM

## 2015-11-16 DIAGNOSIS — E1121 Type 2 diabetes mellitus with diabetic nephropathy: Secondary | ICD-10-CM | POA: Diagnosis not present

## 2015-11-16 DIAGNOSIS — G4733 Obstructive sleep apnea (adult) (pediatric): Secondary | ICD-10-CM

## 2015-11-16 DIAGNOSIS — M62830 Muscle spasm of back: Secondary | ICD-10-CM

## 2015-11-16 DIAGNOSIS — K7581 Nonalcoholic steatohepatitis (NASH): Secondary | ICD-10-CM | POA: Diagnosis not present

## 2015-11-16 LAB — POCT GLYCOSYLATED HEMOGLOBIN (HGB A1C): Hemoglobin A1C: 9.1

## 2015-11-16 MED ORDER — BACLOFEN 10 MG PO TABS: 10.0000 mg | ORAL_TABLET | Freq: Three times a day (TID) | ORAL | 0 refills | Status: DC

## 2015-11-16 MED ORDER — METOPROLOL SUCCINATE ER 25 MG PO TB24: 25.0000 mg | ORAL_TABLET | Freq: Every day | ORAL | 2 refills | Status: DC

## 2015-11-16 MED ORDER — ICOSAPENT ETHYL 1 G PO CAPS: 2.0000 g | ORAL_CAPSULE | Freq: Two times a day (BID) | ORAL | 2 refills | Status: DC

## 2015-11-16 MED ORDER — SILDENAFIL CITRATE 20 MG PO TABS: 20.0000 mg | ORAL_TABLET | Freq: Every day | ORAL | 1 refills | Status: DC | PRN

## 2015-11-16 MED ORDER — CLOPIDOGREL BISULFATE 75 MG PO TABS: 75.0000 mg | ORAL_TABLET | Freq: Every day | ORAL | 1 refills | Status: DC

## 2015-11-16 MED ORDER — LISINOPRIL 10 MG PO TABS: 10.0000 mg | ORAL_TABLET | Freq: Every day | ORAL | 1 refills | Status: DC

## 2015-11-16 MED ORDER — EMPAGLIFLOZIN-METFORMIN HCL ER 10-1000 MG PO TB24: 1.0000 | ORAL_TABLET | Freq: Every day | ORAL | 2 refills | Status: DC

## 2015-11-16 MED ORDER — DULAGLUTIDE 0.75 MG/0.5ML ~~LOC~~ SOAJ: 0.7500 mg | SUBCUTANEOUS | 2 refills | Status: DC

## 2015-11-16 NOTE — Progress Notes (Signed)
Name: Jesse Lawrence.   MRN: [id number]    DOB: [birth year]   Date:11/16/2015       Progress Note  Subjective  Chief Complaint  Chief Complaint  Patient presents with  . Medication Refill    3 month F/U  . Diabetes    Patient states he spoke to the pharmacist and the reason he had constant diarrhea for the past 3 months is due to the increase of his medication. Patient checks his BS once daily and his BS has been averaging in the 200's   . Gastroesophageal Reflux    Stable with medication daily  . Hypertension  . Spasms    Patient states yesterday he pulled his back when moving boxes at work from [employer]. On his right middle side and states it is very stiff and hard for him to get up at times. He had a Flexeril from before and it helped him sleep.     HPI  DM II :  He stopped checking his glucose at home because it has been running high. He has not been exercising.  He denies polyphagia, only occasionally has polydipsia and  polyuria. No blurred vision. He has been compliant with the generic medication and denies side effects of medications. His hgbA1C has improved from 9.8% to 7.9%, but is up again at 9.1%.He has a different insurance plan and no longer has to meet a deductible, he would like to go back on GLP-1 injection and a different type of oral medication, since Metformin immediate release and he had to decrease to two pills daily. He is due for eye exam.  HTN/CAD: no chest pain, no palpitation, bp is at goal, no side effects of medication  GERD: he is back on Omeprazole, advised to go back on  Ranitidine otc,only triggered by dietary indiscretion, otherwise under good control. No heartburn or regurgitation at this time  ED: he has difficulty maintaining and erection, uses Cialis prn, but he would like to try sildenafil   OSA: he has not been compliant with CPAP machine, explained he needs to resume it  Back spasms: he had to move boxes and shelves at work for the  past two days and developed pain/spasms on right upper back. No rashes, no radiation. Worse with movement or pressure  Patient Active Problem List   Diagnosis Date Noted  . OSA (obstructive sleep apnea) 05/21/2015  . Perennial allergic rhinitis 11/07/2014  . Arteriosclerosis of coronary artery 11/07/2014  . CD (contact dermatitis) 11/07/2014  . Decreased libido 11/07/2014  . Diabetes mellitus with renal manifestation ([place]) 11/07/2014  . Gastro-esophageal reflux disease without esophagitis 11/07/2014  . H/O acute myocardial infarction 11/07/2014  . Hypercholesteremia 11/07/2014  . Benign hypertension 11/07/2014  . Hypertriglyceridemia 11/07/2014  . H/O high risk medication treatment 11/07/2014  . NASH (nonalcoholic steatohepatitis) 11/07/2014  . Microalbuminuria 11/07/2014  . Adult BMI 30+ 11/07/2014  . Fungal infection of toenail 11/07/2014  . Tobacco abuse 11/07/2014  . ED (erectile dysfunction) of organic origin 09/29/2006    Past Surgical History:  Procedure Laterality Date  . cadiac stenting    . KNEE ARTHROSCOPY      Family History  Problem Relation Age of Onset  . Diabetes Mother   . CAD Mother   . Heart disease Mother   . Heart disease Father     Social History   Social History  . Marital status: Married    Spouse name: N/A  . Number of children: N/A  .  Years of education: N/A   Occupational History  . Not on file.   Social History Main Topics  . Smoking status: Former Smoker    Packs/day: 1.50    Years: 30.00    Types: Cigarettes    Quit date: 04/19/2013  . Smokeless tobacco: Current User  . Alcohol use No  . Drug use: No  . Sexual activity: Yes   Other Topics Concern  . Not on file   Social History Narrative  . No narrative on file     Current Outpatient Prescriptions:  .  atorvastatin (LIPITOR) 80 MG tablet, Take 1 tablet (80 mg total) by mouth daily at 6 PM., Disp: 30 tablet, Rfl: 5 .  clonazePAM (KLONOPIN) 0.5 MG tablet, Take 1 tablet  (0.5 mg total) by mouth 2 (two) times daily as needed for anxiety., Disp: 20 tablet, Rfl: 1 .  clopidogrel (PLAVIX) 75 MG tablet, Take 1 tablet (75 mg total) by mouth daily., Disp: 90 tablet, Rfl: 1 .  fluticasone (FLONASE) 50 MCG/ACT nasal spray, Place 2 sprays into both nostrils as needed., Disp: 48 g, Rfl: 1 .  glucose blood (ONE TOUCH ULTRA TEST) test strip, Use as instructed, Disp: 100 each, Rfl: 12 .  Icosapent Ethyl (VASCEPA) 1 g CAPS, Take 2 g by mouth 2 (two) times daily., Disp: 120 capsule, Rfl: 2 .  lisinopril (PRINIVIL,ZESTRIL) 10 MG tablet, Take 1 tablet (10 mg total) by mouth daily., Disp: 90 tablet, Rfl: 1 .  loratadine (CLARITIN) 10 MG tablet, Take 1 tablet by mouth daily., Disp: , Rfl:  .  ranitidine (ZANTAC) 300 MG tablet, Take 1 tablet (300 mg total) by mouth 2 (two) times daily., Disp: 60 tablet, Rfl: 2 .  Dulaglutide (TRULICITY) 5.80 DX/8.3JA SOPN, Inject 0.75 mg into the skin once a week., Disp: 4 pen, Rfl: 2 .  Empagliflozin-Metformin HCl ER (SYNJARDY XR) 01-999 MG TB24, Take 1 tablet by mouth daily., Disp: 30 tablet, Rfl: 2 .  metoprolol succinate (TOPROL-XL) 25 MG 24 hr tablet, Take 1 tablet (25 mg total) by mouth daily., Disp: 30 tablet, Rfl: 2 .  sildenafil (REVATIO) 20 MG tablet, Take 1-5 tablets (20-100 mg total) by mouth daily as needed., Disp: 50 tablet, Rfl: 1  No Known Allergies   ROS  Constitutional: Negative for fever, positive for  weight change.  Respiratory: Negative for cough and shortness of breath.   Cardiovascular: Negative for chest pain or palpitations.  Gastrointestinal: Negative for abdominal pain, no bowel changes.  Musculoskeletal: Negative for gait problem or joint swelling.  Skin: Negative for rash.  Neurological: Negative for dizziness or headache.  No other specific complaints in a complete review of systems (except as listed in HPI above).  Objective  Vitals:   11/16/15 1508  BP: 130/60  Pulse: 88  Resp: 18  Temp: 98 F (36.7 C)   TempSrc: Oral  SpO2: 97%  Weight: 241 lb 11.2 oz (109.6 kg)  Height: 5' 8"  (1.727 m)    Body mass index is 36.75 kg/m.  Physical Exam  Constitutional: Patient appears well-developed and well-nourished. Obese  No distress.  HEENT: head atraumatic, normocephalic, pupils equal and reactive to light,  neck supple, throat within normal limits Cardiovascular: Normal rate, regular rhythm and normal heart sounds.  No murmur heard. No BLE edema. Pulmonary/Chest: Effort normal and breath sounds normal. No respiratory distress. Abdominal: Soft.  There is no tenderness. Psychiatric: Patient has a normal mood and affect. behavior is normal. Judgment and thought content normal. Muscular Skeletal:  paraspinal muscle , tender to palpation   Recent Results (from the past 2160 hour(s))  POCT HgB A1C     Status: Abnormal   Collection Time: 11/16/15  3:10 PM  Result Value Ref Range   Hemoglobin A1C 9.1     Diabetic Foot Exam: Diabetic Foot Exam - Simple   Simple Foot Form Diabetic Foot exam was performed with the following findings:  Yes 11/16/2015  3:57 PM  Visual Inspection See comments:  Yes Sensation Testing Intact to touch and monofilament testing bilaterally:  Yes Pulse Check Posterior Tibialis and Dorsalis pulse intact bilaterally:  Yes Comments He has dry skin      PHQ2/9: Depression screen [facility] 2/9 11/16/2015 08/17/2015 07/04/2015 05/21/2015 02/16/2015  Decreased Interest 0 0 0 0 0  Down, Depressed, Hopeless 0 0 0 0 0  PHQ - 2 Score 0 0 0 0 0     Fall Risk: Fall Risk  11/16/2015 08/17/2015 07/04/2015 05/21/2015 02/16/2015  Falls in the past year? No No No No No     Functional Status Survey: Is the patient deaf or have difficulty hearing?: No Does the patient have difficulty seeing, even when wearing glasses/contacts?: No Does the patient have difficulty concentrating, remembering, or making decisions?: No Does the patient have difficulty walking or climbing stairs?: No Does  the patient have difficulty dressing or bathing?: No Does the patient have difficulty doing errands alone such as visiting a doctor's office or shopping?: No   Assessment & Plan  1. Type 2 diabetes mellitus with diabetic nephropathy, without long-term current use of insulin (HCC)  - POCT HgB A1C - Dulaglutide (TRULICITY) 1.63 AG/5.3MI SOPN; Inject 0.75 mg into the skin once a week.  Dispense: 4 pen; Refill: 2 - Empagliflozin-Metformin HCl ER (SYNJARDY XR) 01-999 MG TB24; Take 1 tablet by mouth daily.  Dispense: 30 tablet; Refill: 2  2. Arteriosclerosis of coronary artery  No recent symptoms - metoprolol succinate (TOPROL-XL) 25 MG 24 hr tablet; Take 1 tablet (25 mg total) by mouth daily.  Dispense: 30 tablet; Refill: 2 - clopidogrel (PLAVIX) 75 MG tablet; Take 1 tablet (75 mg total) by mouth daily.  Dispense: 90 tablet; Refill: 1  3. Benign hypertension  - metoprolol succinate (TOPROL-XL) 25 MG 24 hr tablet; Take 1 tablet (25 mg total) by mouth daily.  Dispense: 30 tablet; Refill: 2 - lisinopril (PRINIVIL,ZESTRIL) 10 MG tablet; Take 1 tablet (10 mg total) by mouth daily.  Dispense: 90 tablet; Refill: 1  4. NASH (nonalcoholic steatohepatitis)  Recheck labs next visit , did not keep follow up with GI, advised to go back   5. OSA (obstructive sleep apnea)  Discussed increase risk of strokes and MI without the use of CPAP machine  6. Hypertriglyceridemia  Did not start yet, because of cost - Icosapent Ethyl (VASCEPA) 1 g CAPS; Take 2 g by mouth 2 (two) times daily.  Dispense: 120 capsule; Refill: 2  7. Erectile dysfunction, unspecified erectile dysfunction type  - sildenafil (REVATIO) 20 MG tablet; Take 1-5 tablets (20-100 mg total) by mouth daily as needed.  Dispense: 50 tablet; Refill: 1  8. Back spasm  Advised to use massage, heat and we will try baclofen prn, explained that it will cause sedation

## 2015-11-20 ENCOUNTER — Telehealth: Payer: Self-pay

## 2015-11-20 ENCOUNTER — Other Ambulatory Visit: Payer: Self-pay | Admitting: Family Medicine

## 2015-11-20 MED ORDER — DAPAGLIFLOZIN PRO-METFORMIN ER 10-1000 MG PO TB24: 1.0000 | ORAL_TABLET | ORAL | 2 refills | Status: DC

## 2015-11-20 NOTE — Telephone Encounter (Signed)
[facility] Faxed [place] that Synjardy XR is not covered but Invokamet/Invokamet XR and Merleen Nicely is . Please change due to patient Insurance. Thanks

## 2015-12-29 ENCOUNTER — Other Ambulatory Visit: Payer: Self-pay | Admitting: Family Medicine

## 2015-12-29 DIAGNOSIS — N529 Male erectile dysfunction, unspecified: Secondary | ICD-10-CM

## 2016-02-17 ENCOUNTER — Other Ambulatory Visit: Payer: Self-pay | Admitting: Family Medicine

## 2016-02-17 DIAGNOSIS — E1121 Type 2 diabetes mellitus with diabetic nephropathy: Secondary | ICD-10-CM

## 2016-02-17 MED ORDER — DULAGLUTIDE 1.5 MG/0.5ML ~~LOC~~ SOAJ: 1.5000 mg | Freq: Every day | SUBCUTANEOUS | 2 refills | Status: DC

## 2016-02-19 ENCOUNTER — Other Ambulatory Visit: Payer: Self-pay | Admitting: Family Medicine

## 2016-02-22 ENCOUNTER — Ambulatory Visit (INDEPENDENT_AMBULATORY_CARE_PROVIDER_SITE_OTHER): Payer: Managed Care, Other (non HMO) | Admitting: Family Medicine

## 2016-02-22 ENCOUNTER — Encounter: Payer: Self-pay | Admitting: Family Medicine

## 2016-02-22 VITALS — BP 124/78 | HR 78 | Temp 98.0°F | Resp 16 | Ht 68.0 in | Wt 227.6 lb

## 2016-02-22 DIAGNOSIS — I2510 Atherosclerotic heart disease of native coronary artery without angina pectoris: Secondary | ICD-10-CM | POA: Diagnosis not present

## 2016-02-22 DIAGNOSIS — Z23 Encounter for immunization: Secondary | ICD-10-CM | POA: Diagnosis not present

## 2016-02-22 DIAGNOSIS — E1169 Type 2 diabetes mellitus with other specified complication: Secondary | ICD-10-CM

## 2016-02-22 DIAGNOSIS — E785 Hyperlipidemia, unspecified: Secondary | ICD-10-CM

## 2016-02-22 DIAGNOSIS — E7800 Pure hypercholesterolemia, unspecified: Secondary | ICD-10-CM

## 2016-02-22 DIAGNOSIS — I10 Essential (primary) hypertension: Secondary | ICD-10-CM | POA: Diagnosis not present

## 2016-02-22 DIAGNOSIS — E1121 Type 2 diabetes mellitus with diabetic nephropathy: Secondary | ICD-10-CM

## 2016-02-22 DIAGNOSIS — E781 Pure hyperglyceridemia: Secondary | ICD-10-CM | POA: Diagnosis not present

## 2016-02-22 DIAGNOSIS — K7581 Nonalcoholic steatohepatitis (NASH): Secondary | ICD-10-CM

## 2016-02-22 DIAGNOSIS — N529 Male erectile dysfunction, unspecified: Secondary | ICD-10-CM

## 2016-02-22 DIAGNOSIS — G4733 Obstructive sleep apnea (adult) (pediatric): Secondary | ICD-10-CM

## 2016-02-22 LAB — POCT GLYCOSYLATED HEMOGLOBIN (HGB A1C): Hemoglobin A1C: 9.6

## 2016-02-22 MED ORDER — CLOPIDOGREL BISULFATE 75 MG PO TABS: 75.0000 mg | ORAL_TABLET | Freq: Every day | ORAL | 5 refills | Status: DC

## 2016-02-22 MED ORDER — LISINOPRIL 10 MG PO TABS: 10.0000 mg | ORAL_TABLET | Freq: Every day | ORAL | 5 refills | Status: DC

## 2016-02-22 MED ORDER — ICOSAPENT ETHYL 1 G PO CAPS: 2.0000 g | ORAL_CAPSULE | Freq: Two times a day (BID) | ORAL | 2 refills | Status: DC

## 2016-02-22 MED ORDER — DAPAGLIFLOZIN PRO-METFORMIN ER 5-1000 MG PO TB24
2.0000 | ORAL_TABLET | Freq: Every day | ORAL | 5 refills | Status: DC
Start: 2016-02-22 — End: 2016-05-26

## 2016-02-22 MED ORDER — METOPROLOL SUCCINATE ER 25 MG PO TB24: 25.0000 mg | ORAL_TABLET | Freq: Every day | ORAL | 2 refills | Status: DC

## 2016-02-22 MED ORDER — ATORVASTATIN CALCIUM 80 MG PO TABS: 80.0000 mg | ORAL_TABLET | Freq: Every day | ORAL | 5 refills | Status: DC

## 2016-02-22 NOTE — Progress Notes (Signed)
Name: Jesse Lawrence.   MRN: [id number]    DOB: 08/27/1960   Date:02/22/2016       Progress Note  Subjective  Chief Complaint  Chief Complaint  Patient presents with  . Diabetes    pt did not take injections for 2 weeks  . Hyperlipidemia  . Hypertension  . Flu Vaccine    HPI  DM II :  He  only checks glucose occasionally and it can be up to 300 at times. Marland Kitchen His hgbA1C has gone  from 9.8% to 7.9%, up to  9.1% and now to 9.6%.He is on Trulicity but still on 9.79 dose, he is also taking Xigduo 01/999 mg daily, and has lost weight since last visit. He has noticed polydipsia, polyuria and polyphagia. He eats out for lunch every day. He is due for eye exam. He is drinking sugar free energy drink.   HTN/CAD: no chest pain, no palpitation, bp is at goal, no side effects of medication  GERD: he is off Omeprazole, he is drinking energy drinks three times daily, he also likes spicy food, H2 blocker is not helping symptoms but he is willing to resume diet.   ED: he has difficulty maintaining and erection, uses Cialis prn, but he would like to try sildenafil   OSA: he has not been compliant with CPAP machine, explained he needs to resume it  Back spasms: he is doing well now, taking Baclofen only prn   Obesity: he lost weight, but it may be because DM so out of control   Patient Active Problem List   Diagnosis Date Noted  . OSA (obstructive sleep apnea) 05/21/2015  . Perennial allergic rhinitis 11/07/2014  . Arteriosclerosis of coronary artery 11/07/2014  . CD (contact dermatitis) 11/07/2014  . Decreased libido 11/07/2014  . Diabetes mellitus with renal manifestation ([place]) 11/07/2014  . Gastro-esophageal reflux disease without esophagitis 11/07/2014  . H/O acute myocardial infarction 11/07/2014  . Hypercholesteremia 11/07/2014  . Benign hypertension 11/07/2014  . Hypertriglyceridemia 11/07/2014  . H/O high risk medication treatment 11/07/2014  . NASH (nonalcoholic  steatohepatitis) 11/07/2014  . Microalbuminuria 11/07/2014  . Adult BMI 30+ 11/07/2014  . Fungal infection of toenail 11/07/2014  . Tobacco abuse 11/07/2014  . ED (erectile dysfunction) of organic origin 09/29/2006    Past Surgical History:  Procedure Laterality Date  . cadiac stenting    . KNEE ARTHROSCOPY      Family History  Problem Relation Age of Onset  . Diabetes Mother   . CAD Mother   . Heart disease Mother   . Heart disease Father     Social History   Social History  . Marital status: Married    Spouse name: N/A  . Number of children: N/A  . Years of education: N/A   Occupational History  . Not on file.   Social History Main Topics  . Smoking status: Former Smoker    Packs/day: 1.50    Years: 30.00    Types: Cigarettes    Quit date: 04/19/2013  . Smokeless tobacco: Current User  . Alcohol use No  . Drug use: No  . Sexual activity: Yes   Other Topics Concern  . Not on file   Social History Narrative  . No narrative on file     Current Outpatient Prescriptions:  .  atorvastatin (LIPITOR) 80 MG tablet, Take 1 tablet (80 mg total) by mouth daily at 6 PM., Disp: 30 tablet, Rfl: 5 .  baclofen (LIORESAL) 10 MG  tablet, Take 1 tablet (10 mg total) by mouth 3 (three) times daily., Disp: 30 each, Rfl: 0 .  clonazePAM (KLONOPIN) 0.5 MG tablet, Take 1 tablet (0.5 mg total) by mouth 2 (two) times daily as needed for anxiety., Disp: 20 tablet, Rfl: 1 .  clopidogrel (PLAVIX) 75 MG tablet, Take 1 tablet (75 mg total) by mouth daily., Disp: 30 tablet, Rfl: 5 .  Dapagliflozin-Metformin HCl ER (XIGDUO XR) 08-998 MG TB24, Take 2 tablets by mouth daily., Disp: 60 tablet, Rfl: 5 .  Dulaglutide (TRULICITY) 1.5 [place]/3.7TG SOPN, Inject 1.5 mg into the skin daily., Disp: 4 pen, Rfl: 2 .  fluticasone (FLONASE) 50 MCG/ACT nasal spray, Place 2 sprays into both nostrils as needed., Disp: 48 g, Rfl: 1 .  glucose blood (ONE TOUCH ULTRA TEST) test strip, Use as instructed, Disp: 100  each, Rfl: 12 .  Icosapent Ethyl (VASCEPA) 1 g CAPS, Take 2 g by mouth 2 (two) times daily., Disp: 120 capsule, Rfl: 2 .  lisinopril (PRINIVIL,ZESTRIL) 10 MG tablet, Take 1 tablet (10 mg total) by mouth daily., Disp: 30 tablet, Rfl: 5 .  loratadine (CLARITIN) 10 MG tablet, Take 1 tablet by mouth daily., Disp: , Rfl:  .  metoprolol succinate (TOPROL-XL) 25 MG 24 hr tablet, Take 1 tablet (25 mg total) by mouth daily., Disp: 30 tablet, Rfl: 2 .  ranitidine (ZANTAC) 300 MG tablet, Take 1 tablet (300 mg total) by mouth 2 (two) times daily., Disp: 60 tablet, Rfl: 2 .  sildenafil (REVATIO) 20 MG tablet, TAKE 1 TO 5 TABLETS BY MOUTH DAILY IF NEEDED, Disp: 50 tablet, Rfl: 0  No Known Allergies   ROS  Constitutional: Negative for fever, positive for  weight change.  Respiratory: Negative for cough and shortness of breath.   Cardiovascular: Negative for chest pain or palpitations.  Gastrointestinal: Negative for abdominal pain, no bowel changes.  Musculoskeletal: Negative for gait problem or joint swelling.  Skin: Negative for rash.  Neurological: Negative for dizziness or headache.  No other specific complaints in a complete review of systems (except as listed in HPI above).  Objective  Vitals:   02/22/16 0944  BP: 124/78  Pulse: 78  Resp: 16  Temp: 98 F (36.7 C)  TempSrc: Oral  SpO2: 96%  Weight: 227 lb 9 oz (103.2 kg)  Height: 5' 8"  (1.727 m)    Body mass index is 34.6 kg/m.  Physical Exam  Constitutional: Patient appears well-developed and well-nourished. Obese  No distress.  HEENT: head atraumatic, normocephalic, pupils equal and reactive to light,  neck supple, throat within normal limits Cardiovascular: Normal rate, regular rhythm and normal heart sounds.  No murmur heard. No BLE edema. Pulmonary/Chest: Effort normal and breath sounds normal. No respiratory distress. Abdominal: Soft.  There is no tenderness. Psychiatric: Patient has a normal mood and affect. behavior is  normal. Judgment and thought content normal.   Recent Results (from the past 2160 hour(s))  POCT HgB A1C     Status: Abnormal   Collection Time: 02/22/16 10:05 AM  Result Value Ref Range   Hemoglobin A1C 9.6       PHQ2/9: Depression screen [facility] 2/9 11/16/2015 08/17/2015 07/04/2015 05/21/2015 02/16/2015  Decreased Interest 0 0 0 0 0  Down, Depressed, Hopeless 0 0 0 0 0  PHQ - 2 Score 0 0 0 0 0     Fall Risk: Fall Risk  11/16/2015 08/17/2015 07/04/2015 05/21/2015 02/16/2015  Falls in the past year? No No No No No  Assessment & Plan  1. Type 2 diabetes mellitus with diabetic nephropathy, without long-term current use of insulin (HCC)  - POCT HgB A1C - Dapagliflozin-Metformin HCl ER (XIGDUO XR) 08-998 MG TB24; Take 2 tablets by mouth daily.  Dispense: 60 tablet; Refill: 5  2. Need for influenza vaccination  - Flu Vaccine QUAD 36+ mos PF IM (Fluarix & Fluzone Quad PF)  3. Need for pneumococcal vaccination  -PCV 13   4. Benign hypertension  - lisinopril (PRINIVIL,ZESTRIL) 10 MG tablet; Take 1 tablet (10 mg total) by mouth daily.  Dispense: 30 tablet; Refill: 5 - metoprolol succinate (TOPROL-XL) 25 MG 24 hr tablet; Take 1 tablet (25 mg total) by mouth daily.  Dispense: 30 tablet; Refill: 2  5. NASH (nonalcoholic steatohepatitis)  He needs to get DM under control   6. OSA (obstructive sleep apnea)  He needs to resume CPAP   7. Hypercholesteremia  - atorvastatin (LIPITOR) 80 MG tablet; Take 1 tablet (80 mg total) by mouth daily at 6 PM.  Dispense: 30 tablet; Refill: 5  8. Arteriosclerosis of coronary artery  - clopidogrel (PLAVIX) 75 MG tablet; Take 1 tablet (75 mg total) by mouth daily.  Dispense: 30 tablet; Refill: 5 - metoprolol succinate (TOPROL-XL) 25 MG 24 hr tablet; Take 1 tablet (25 mg total) by mouth daily.  Dispense: 30 tablet; Refill: 2  9. Erectile dysfunction, unspecified erectile dysfunction type   10. Dyslipidemia associated with type 2 diabetes  mellitus (HCC)  - Icosapent Ethyl (VASCEPA) 1 g CAPS; Take 2 g by mouth 2 (two) times daily.  Dispense: 120 capsule; Refill: 2  11. Hypertriglyceridemia  - Icosapent Ethyl (VASCEPA) 1 g CAPS; Take 2 g by mouth 2 (two) times daily.  Dispense: 120 capsule; Refill: 2

## 2016-04-28 ENCOUNTER — Other Ambulatory Visit: Payer: Self-pay | Admitting: Family Medicine

## 2016-04-28 DIAGNOSIS — E781 Pure hyperglyceridemia: Secondary | ICD-10-CM

## 2016-04-28 DIAGNOSIS — E785 Hyperlipidemia, unspecified: Secondary | ICD-10-CM

## 2016-04-28 DIAGNOSIS — N529 Male erectile dysfunction, unspecified: Secondary | ICD-10-CM

## 2016-04-28 DIAGNOSIS — E1169 Type 2 diabetes mellitus with other specified complication: Secondary | ICD-10-CM

## 2016-04-28 NOTE — Telephone Encounter (Signed)
Patient requesting refill of Vascepa and Sildenafil to [facility].

## 2016-04-30 ENCOUNTER — Ambulatory Visit
Admission: EM | Admit: 2016-04-30 | Discharge: 2016-04-30 | Disposition: A | Discharge status: Home / Self Care | Payer: Managed Care, Other (non HMO) | Attending: Family Medicine | Admitting: Family Medicine

## 2016-04-30 ENCOUNTER — Encounter: Payer: Self-pay | Admitting: *Deleted

## 2016-04-30 DIAGNOSIS — J069 Acute upper respiratory infection, unspecified: Secondary | ICD-10-CM | POA: Diagnosis not present

## 2016-04-30 DIAGNOSIS — B9789 Other viral agents as the cause of diseases classified elsewhere: Secondary | ICD-10-CM

## 2016-04-30 NOTE — ED Triage Notes (Signed)
Patient started having symptoms of nasal congestion / drainage, and sore throat yesterday.

## 2016-04-30 NOTE — ED Provider Notes (Signed)
[facility]    CSN: [id number] Arrival date & time: 04/30/16  1031     History   Chief Complaint Chief Complaint  Patient presents with  . Sore Throat  . Nasal Congestion    HPI Jesse Proch. is a 56 y.o. male.   The history is provided by the patient.  Sore Throat  Pertinent negatives include no headaches.  URI  Presenting symptoms: congestion, cough, rhinorrhea and sore throat   Severity:  Moderate Onset quality:  Sudden Duration:  2 days Timing:  Constant Progression:  Unchanged Chronicity:  New Relieved by:  None tried Ineffective treatments:  None tried Associated symptoms: no headaches, no sinus pain, no swollen glands and no wheezing   Risk factors: chronic respiratory disease, diabetes mellitus and sick contacts   Risk factors: not elderly, no chronic cardiac disease, no chronic kidney disease, no immunosuppression, no recent illness and no recent travel     Past Medical History:  Diagnosis Date  . Allergy   . Anxiety   . COPD (chronic obstructive pulmonary disease) ([place])   . Diabetes mellitus without complication ([place])   . Hyperlipidemia   . Hypertension   . Male impotence   . Obesity     Patient Active Problem List   Diagnosis Date Noted  . OSA (obstructive sleep apnea) 05/21/2015  . Perennial allergic rhinitis 11/07/2014  . Arteriosclerosis of coronary artery 11/07/2014  . CD (contact dermatitis) 11/07/2014  . Decreased libido 11/07/2014  . Diabetes mellitus with renal manifestation ([place]) 11/07/2014  . Gastro-esophageal reflux disease without esophagitis 11/07/2014  . H/O acute myocardial infarction 11/07/2014  . Hypercholesteremia 11/07/2014  . Benign hypertension 11/07/2014  . Hypertriglyceridemia 11/07/2014  . H/O high risk medication treatment 11/07/2014  . NASH (nonalcoholic steatohepatitis) 11/07/2014  . Microalbuminuria 11/07/2014  . Adult BMI 30+ 11/07/2014  . Fungal infection of toenail 11/07/2014  . Tobacco  abuse 11/07/2014  . ED (erectile dysfunction) of organic origin 09/29/2006    Past Surgical History:  Procedure Laterality Date  . cadiac stenting    . KNEE ARTHROSCOPY         Home Medications    Prior to Admission medications   Medication Sig Start Date End Date Taking? Authorizing Provider  clopidogrel (PLAVIX) 75 MG tablet Take 1 tablet (75 mg total) by mouth daily. 02/22/16  Yes Steele Sizer, MD  Dapagliflozin-Metformin HCl ER (XIGDUO XR) 08-998 MG TB24 Take 2 tablets by mouth daily. 02/22/16  Yes Steele Sizer, MD  Dulaglutide (TRULICITY) 1.5 WU/1.3KG SOPN Inject 1.5 mg into the skin daily. 02/17/16  Yes Steele Sizer, MD  glucose blood (ONE TOUCH ULTRA TEST) test strip Use as instructed 08/17/15  Yes Steele Sizer, MD  lisinopril (PRINIVIL,ZESTRIL) 10 MG tablet Take 1 tablet (10 mg total) by mouth daily. 02/22/16  Yes Steele Sizer, MD  loratadine (CLARITIN) 10 MG tablet Take 1 tablet by mouth daily. 08/09/12  Yes Historical Provider, MD  metoprolol succinate (TOPROL-XL) 25 MG 24 hr tablet Take 1 tablet (25 mg total) by mouth daily. 02/22/16  Yes Steele Sizer, MD  Rosuvastatin Calcium (CRESTOR PO) Take 1 tablet by mouth daily.   Yes Historical Provider, MD  sildenafil (REVATIO) 20 MG tablet TAKE 1 TO 5 TABLETS BY MOUTH AS NEEDED 04/28/16  Yes Steele Sizer, MD  VASCEPA 1 g CAPS TAKE 2 CAPSULES BY MOUTH TWICE DAILY 04/28/16  Yes Steele Sizer, MD  atorvastatin (LIPITOR) 80 MG tablet Take 1 tablet (80 mg total) by mouth daily at  6 PM. 02/22/16   Steele Sizer, MD  baclofen (LIORESAL) 10 MG tablet Take 1 tablet (10 mg total) by mouth 3 (three) times daily. 11/16/15   Steele Sizer, MD  clonazePAM (KLONOPIN) 0.5 MG tablet Take 1 tablet (0.5 mg total) by mouth 2 (two) times daily as needed for anxiety. 10/10/14   Steele Sizer, MD  fluticasone (FLONASE) 50 MCG/ACT nasal spray Place 2 sprays into both nostrils as needed. 11/10/14   Steele Sizer, MD  ranitidine (ZANTAC) 300 MG  tablet Take 1 tablet (300 mg total) by mouth 2 (two) times daily. 07/04/15   Steele Sizer, MD    Family History Family History  Problem Relation Age of Onset  . Diabetes Mother   . CAD Mother   . Heart disease Mother   . Heart disease Father     Social History Social History  Substance Use Topics  . Smoking status: Former Smoker    Packs/day: 1.50    Years: 30.00    Types: Cigarettes    Quit date: 04/19/2013  . Smokeless tobacco: Current User  . Alcohol use No     Allergies   Patient has no known allergies.   Review of Systems Review of Systems  HENT: Positive for congestion, rhinorrhea and sore throat. Negative for sinus pain.   Respiratory: Positive for cough. Negative for wheezing.   Neurological: Negative for headaches.     Physical Exam Triage Vital Signs ED Triage Vitals  Enc Vitals Group     BP 04/30/16 1102 125/79     Pulse Rate 04/30/16 1102 87     Resp 04/30/16 1102 16     Temp 04/30/16 1102 99.6 F (37.6 C)     Temp Source 04/30/16 1102 Oral     SpO2 04/30/16 1102 98 %     Weight 04/30/16 1102 225 lb (102.1 kg)     Height 04/30/16 1102 5' 8"  (1.727 m)     Head Circumference --      Peak Flow --      Pain Score 04/30/16 1111 0     Pain Loc --      Pain Edu? --      Excl. in [place]? --    No data found.   Updated Vital Signs BP 125/79 (BP Location: Left Arm)   Pulse 87   Temp 99.6 F (37.6 C) (Oral)   Resp 16   Ht 5' 8"  (1.727 m)   Wt 225 lb (102.1 kg)   SpO2 98%   BMI 34.21 kg/m   Visual Acuity Right Eye Distance:   Left Eye Distance:   Bilateral Distance:    Right Eye Near:   Left Eye Near:    Bilateral Near:     Physical Exam  Constitutional: He appears well-developed and well-nourished. No distress.  HENT:  Head: Normocephalic and atraumatic.  Right Ear: Tympanic membrane, external ear and ear canal normal.  Left Ear: Tympanic membrane, external ear and ear canal normal.  Nose: Nose normal.  Mouth/Throat: Uvula is  midline, oropharynx is clear and moist and mucous membranes are normal. No oropharyngeal exudate or tonsillar abscesses.  Eyes: Conjunctivae and EOM are normal. Pupils are equal, round, and reactive to light. Right eye exhibits no discharge. Left eye exhibits no discharge. No scleral icterus.  Neck: Normal range of motion. Neck supple. No tracheal deviation present. No thyromegaly present.  Cardiovascular: Normal rate, regular rhythm and normal heart sounds.   Pulmonary/Chest: Effort normal and breath sounds normal. No  stridor. No respiratory distress. He has no wheezes. He has no rales. He exhibits no tenderness.  Lymphadenopathy:    He has no cervical adenopathy.  Neurological: He is alert.  Skin: Skin is warm and dry. No rash noted. He is not diaphoretic.  Nursing note and vitals reviewed.    UC Treatments / Results  Labs (all labs ordered are listed, but only abnormal results are displayed) Labs Reviewed - No data to display  EKG  EKG Interpretation None       Radiology No results found.  Procedures Procedures (including critical care time)  Medications Ordered in UC Medications - No data to display   Initial Impression / Assessment and Plan / UC Course  I have reviewed the triage vital signs and the nursing notes.  Pertinent labs & imaging results that were available during my care of the patient were reviewed by me and considered in my medical decision making (see chart for details).       Final Clinical Impressions(s) / UC Diagnoses   Final diagnoses:  Viral URI with cough    New Prescriptions Discharge Medication List as of 04/30/2016 11:45 AM     1.  diagnosis reviewed with patient 2. rx as per orders above; reviewed possible side effects, interactions, risks and benefits  3. Recommend supportive treatment with rest, fluids 4. Follow-up prn if symptoms worsen or don't improve   Norval Gable, MD 04/30/16 1208

## 2016-05-23 ENCOUNTER — Other Ambulatory Visit: Payer: Self-pay | Admitting: Family Medicine

## 2016-05-26 ENCOUNTER — Encounter: Payer: Self-pay | Admitting: Family Medicine

## 2016-05-26 ENCOUNTER — Ambulatory Visit (INDEPENDENT_AMBULATORY_CARE_PROVIDER_SITE_OTHER): Payer: Managed Care, Other (non HMO) | Admitting: Family Medicine

## 2016-05-26 VITALS — BP 118/66 | HR 85 | Temp 97.8°F | Resp 16 | Ht 68.0 in | Wt 225.9 lb

## 2016-05-26 DIAGNOSIS — G4733 Obstructive sleep apnea (adult) (pediatric): Secondary | ICD-10-CM

## 2016-05-26 DIAGNOSIS — Z79899 Other long term (current) drug therapy: Secondary | ICD-10-CM

## 2016-05-26 DIAGNOSIS — I10 Essential (primary) hypertension: Secondary | ICD-10-CM

## 2016-05-26 DIAGNOSIS — I2510 Atherosclerotic heart disease of native coronary artery without angina pectoris: Secondary | ICD-10-CM

## 2016-05-26 DIAGNOSIS — N529 Male erectile dysfunction, unspecified: Secondary | ICD-10-CM

## 2016-05-26 DIAGNOSIS — I252 Old myocardial infarction: Secondary | ICD-10-CM | POA: Diagnosis not present

## 2016-05-26 DIAGNOSIS — E785 Hyperlipidemia, unspecified: Secondary | ICD-10-CM

## 2016-05-26 DIAGNOSIS — K219 Gastro-esophageal reflux disease without esophagitis: Secondary | ICD-10-CM | POA: Diagnosis not present

## 2016-05-26 DIAGNOSIS — K7581 Nonalcoholic steatohepatitis (NASH): Secondary | ICD-10-CM | POA: Diagnosis not present

## 2016-05-26 DIAGNOSIS — E1121 Type 2 diabetes mellitus with diabetic nephropathy: Secondary | ICD-10-CM | POA: Diagnosis not present

## 2016-05-26 DIAGNOSIS — E1169 Type 2 diabetes mellitus with other specified complication: Secondary | ICD-10-CM

## 2016-05-26 LAB — COMPLETE METABOLIC PANEL WITH GFR
ALT: 37 U/L (ref 9–46)
AST: 20 U/L (ref 10–35)
Albumin: 4.7 g/dL (ref 3.6–5.1)
Alkaline Phosphatase: 66 U/L (ref 40–115)
BILIRUBIN TOTAL: 1.1 mg/dL (ref 0.2–1.2)
BUN: 11 mg/dL (ref 7–25)
CO2: 27 mmol/L (ref 20–31)
Calcium: 9.7 mg/dL (ref 8.6–10.3)
Chloride: 103 mmol/L (ref 98–110)
Creat: 0.72 mg/dL (ref 0.70–1.33)
Glucose, Bld: 124 mg/dL — ABNORMAL HIGH (ref 65–99)
Potassium: 4.7 mmol/L (ref 3.5–5.3)
Sodium: 139 mmol/L (ref 135–146)
TOTAL PROTEIN: 7.3 g/dL (ref 6.1–8.1)

## 2016-05-26 LAB — LIPID PANEL
Cholesterol: 169 mg/dL (ref <200)
HDL: 26 mg/dL — AB (ref >40)
TRIGLYCERIDES: 537 mg/dL — AB (ref <150)
Total CHOL/HDL Ratio: 6.5 Ratio — ABNORMAL HIGH (ref <5.0)

## 2016-05-26 LAB — POCT GLYCOSYLATED HEMOGLOBIN (HGB A1C): HEMOGLOBIN A1C: 7.8

## 2016-05-26 LAB — VITAMIN B12: VITAMIN B 12: 996 pg/mL (ref 200–1100)

## 2016-05-26 MED ORDER — SILDENAFIL CITRATE 20 MG PO TABS: ORAL_TABLET | ORAL | 1 refills | Status: DC

## 2016-05-26 MED ORDER — LISINOPRIL 10 MG PO TABS: 10.0000 mg | ORAL_TABLET | Freq: Every day | ORAL | 5 refills | Status: DC

## 2016-05-26 MED ORDER — XIGDUO XR 5-1000 MG PO TB24: 1.0000 | ORAL_TABLET | Freq: Two times a day (BID) | ORAL | 5 refills | Status: DC

## 2016-05-26 MED ORDER — CLOPIDOGREL BISULFATE 75 MG PO TABS: 75.0000 mg | ORAL_TABLET | Freq: Every day | ORAL | 5 refills | Status: DC

## 2016-05-26 MED ORDER — METOPROLOL SUCCINATE ER 25 MG PO TB24: 25.0000 mg | ORAL_TABLET | Freq: Every day | ORAL | 5 refills | Status: DC

## 2016-05-26 MED ORDER — RANITIDINE HCL 300 MG PO TABS: 150.0000 mg | ORAL_TABLET | Freq: Two times a day (BID) | ORAL | 5 refills | Status: DC

## 2016-05-26 NOTE — Progress Notes (Signed)
Name: Jesse Lawrence.   MRN: [id number]    DOB: Dec 08, 1960   Date:05/26/2016       Progress Note  Subjective  Chief Complaint  Chief Complaint  Patient presents with  . Medication Refill    4 month F/U  . Diabetes    Needs a new meter, checks his BS 6x weekly, Low 103 Highest 231  . Hyperlipidemia    Muscle cramps  . Hypertension    Denies any symptoms  . Gastroesophageal Reflux    States he has been taking Tums for his GERD, symptoms are worst.     HPI  DM II : He  only checks glucose occasionally but doing much better usually 113's in am's and post-prandially 170 's post-prandially. Marland Kitchen His hgbA1C has gone  from 9.8% to 7.9%, up to  9.1% and now to 9.6% and now down to 7.8 %. He is on Trulicity 5.0NL he is also taking Xigduo 08/998 mg two daily, and has lost weight since last visit. He has noticed polydipsia, polyuria and polyphagia. He eats out for lunch every day.  He is drinking sugar free energy drink.   HTN/CAD: no chest pain, no palpitation, bp is at goal, no side effects of medication. He denies decrease in exercise tolerance  GERD: he is off Omeprazole, he is off  drinking energy drinks but still likes spicy food, he forgets to take  H2 blocker, and take Tums instead  ED: he has difficulty maintaining and erection, uses Cialis prn, he is doing well on Sildenafil  OSA: he has not been compliant with CPAP machine, explained he needs to resume it  Back spasms: he is doing well now, taking Baclofen only prn   Obesity: weight is stable, but he is eating better   Patient Active Problem List   Diagnosis Date Noted  . OSA (obstructive sleep apnea) 05/21/2015  . Perennial allergic rhinitis 11/07/2014  . Arteriosclerosis of coronary artery 11/07/2014  . CD (contact dermatitis) 11/07/2014  . Decreased libido 11/07/2014  . Diabetes mellitus with renal manifestation ([place]) 11/07/2014  . Gastro-esophageal reflux disease without esophagitis 11/07/2014  . H/O acute  myocardial infarction 11/07/2014  . Hypercholesteremia 11/07/2014  . Benign hypertension 11/07/2014  . Hypertriglyceridemia 11/07/2014  . H/O high risk medication treatment 11/07/2014  . NASH (nonalcoholic steatohepatitis) 11/07/2014  . Microalbuminuria 11/07/2014  . Adult BMI 30+ 11/07/2014  . Fungal infection of toenail 11/07/2014  . Tobacco abuse 11/07/2014  . ED (erectile dysfunction) of organic origin 09/29/2006    Past Surgical History:  Procedure Laterality Date  . cadiac stenting    . KNEE ARTHROSCOPY      Family History  Problem Relation Age of Onset  . Diabetes Mother   . CAD Mother   . Heart disease Mother   . Heart disease Father     Social History   Social History  . Marital status: Married    Spouse name: N/A  . Number of children: N/A  . Years of education: N/A   Occupational History  . Not on file.   Social History Main Topics  . Smoking status: Former Smoker    Packs/day: 1.50    Years: 30.00    Types: Cigarettes    Quit date: 04/19/2013  . Smokeless tobacco: Current User  . Alcohol use No  . Drug use: No  . Sexual activity: Yes   Other Topics Concern  . Not on file   Social History Narrative  . No narrative on  file     Current Outpatient Prescriptions:  .  atorvastatin (LIPITOR) 80 MG tablet, Take 1 tablet (80 mg total) by mouth daily at 6 PM., Disp: 30 tablet, Rfl: 5 .  baclofen (LIORESAL) 10 MG tablet, Take 1 tablet (10 mg total) by mouth 3 (three) times daily., Disp: 30 each, Rfl: 0 .  clonazePAM (KLONOPIN) 0.5 MG tablet, Take 1 tablet (0.5 mg total) by mouth 2 (two) times daily as needed for anxiety., Disp: 20 tablet, Rfl: 1 .  clopidogrel (PLAVIX) 75 MG tablet, Take 1 tablet (75 mg total) by mouth daily., Disp: 30 tablet, Rfl: 5 .  fluticasone (FLONASE) 50 MCG/ACT nasal spray, Place 2 sprays into both nostrils as needed., Disp: 48 g, Rfl: 1 .  glucose blood (ONE TOUCH ULTRA TEST) test strip, Use as instructed, Disp: 100 each, Rfl:  12 .  lisinopril (PRINIVIL,ZESTRIL) 10 MG tablet, Take 1 tablet (10 mg total) by mouth daily., Disp: 30 tablet, Rfl: 5 .  loratadine (CLARITIN) 10 MG tablet, Take 1 tablet by mouth daily., Disp: , Rfl:  .  metoprolol succinate (TOPROL-XL) 25 MG 24 hr tablet, Take 1 tablet (25 mg total) by mouth daily., Disp: 30 tablet, Rfl: 5 .  ranitidine (ZANTAC) 300 MG tablet, Take 0.5 tablets (150 mg total) by mouth 2 (two) times daily., Disp: 60 tablet, Rfl: 5 .  sildenafil (REVATIO) 20 MG tablet, TAKE 1 TO 5 TABLETS BY MOUTH AS NEEDED, Disp: 50 tablet, Rfl: 1 .  TRULICITY 1.5 BB/0.4UG SOPN, INJECT 1.5 MG INTO THE SKIN ONCE WEEKLY, Disp: 4 pen, Rfl: 2 .  VASCEPA 1 g CAPS, TAKE 2 CAPSULES BY MOUTH TWICE DAILY, Disp: 120 capsule, Rfl: 5 .  XIGDUO XR 08-998 MG TB24, Take 1 tablet by mouth 2 (two) times daily., Disp: 60 tablet, Rfl: 5  No Known Allergies   ROS  Constitutional: Negative for fever or weight change.  Respiratory: Negative for cough and shortness of breath.   Cardiovascular: Negative for chest pain or palpitations.  Gastrointestinal: Negative for abdominal pain, no bowel changes.  Musculoskeletal: Negative for gait problem or joint swelling.  Skin: Negative for rash.  Neurological: Negative for dizziness or headache.  No other specific complaints in a complete review of systems (except as listed in HPI above).  Objective  Vitals:   05/26/16 0911  BP: 118/66  Pulse: 85  Resp: 16  Temp: 97.8 F (36.6 C)  TempSrc: Oral  SpO2: 96%  Weight: 225 lb 14.4 oz (102.5 kg)  Height: 5' 8"  (1.727 m)    Body mass index is 34.35 kg/m.  Physical Exam  Constitutional: Patient appears well-developed and well-nourished. Obese No distress.  HEENT: head atraumatic, normocephalic, pupils equal and reactive to light,  neck supple, throat within normal limits Cardiovascular: Normal rate, regular rhythm and normal heart sounds.  No murmur heard. No BLE edema. Pulmonary/Chest: Effort normal and  breath sounds normal. No respiratory distress. Abdominal: Soft.  There is no tenderness. Psychiatric: Patient has a normal mood and affect. behavior is normal. Judgment and thought content normal.  Recent Results (from the past 2160 hour(s))  POCT HgB A1C     Status: Abnormal   Collection Time: 05/26/16  9:22 AM  Result Value Ref Range   Hemoglobin A1C 7.8      PHQ2/9: Depression screen [facility] 2/9 05/26/2016 11/16/2015 08/17/2015 07/04/2015 05/21/2015  Decreased Interest 0 0 0 0 0  Down, Depressed, Hopeless 0 0 0 0 0  PHQ - 2 Score 0 0 0  0 0     Fall Risk: Fall Risk  05/26/2016 11/16/2015 08/17/2015 07/04/2015 05/21/2015  Falls in the past year? No No No No No    Functional Status Survey: Is the patient deaf or have difficulty hearing?: No Does the patient have difficulty seeing, even when wearing glasses/contacts?: No Does the patient have difficulty concentrating, remembering, or making decisions?: No Does the patient have difficulty walking or climbing stairs?: No Does the patient have difficulty dressing or bathing?: No Does the patient have difficulty doing errands alone such as visiting a doctor's office or shopping?: No   Assessment & Plan  1. Type 2 diabetes mellitus with diabetic nephropathy, without long-term current use of insulin (HCC)  - POCT HgB A1C - lisinopril (PRINIVIL,ZESTRIL) 10 MG tablet; Take 1 tablet (10 mg total) by mouth daily.  Dispense: 30 tablet; Refill: 5 - XIGDUO XR 08-998 MG TB24; Take 1 tablet by mouth 2 (two) times daily.  Dispense: 60 tablet; Refill: 5  2. Benign hypertension  - lisinopril (PRINIVIL,ZESTRIL) 10 MG tablet; Take 1 tablet (10 mg total) by mouth daily.  Dispense: 30 tablet; Refill: 5 - metoprolol succinate (TOPROL-XL) 25 MG 24 hr tablet; Take 1 tablet (25 mg total) by mouth daily.  Dispense: 30 tablet; Refill: 5 - COMPLETE METABOLIC PANEL WITH GFR  3. NASH (nonalcoholic steatohepatitis)  Recheck labs  4. OSA (obstructive sleep  apnea)  She is not using CPAP machine over the past 3 -4 years.   5. Arteriosclerosis of coronary artery  - clopidogrel (PLAVIX) 75 MG tablet; Take 1 tablet (75 mg total) by mouth daily.  Dispense: 30 tablet; Refill: 5 - metoprolol succinate (TOPROL-XL) 25 MG 24 hr tablet; Take 1 tablet (25 mg total) by mouth daily.  Dispense: 30 tablet; Refill: 5  6. Dyslipidemia associated with type 2 diabetes mellitus (HCC)  - XIGDUO XR 08-998 MG TB24; Take 1 tablet by mouth 2 (two) times daily.  Dispense: 60 tablet; Refill: 5 - Lipid panel  7. H/O acute myocardial infarction   8. Gastro-esophageal reflux disease without esophagitis  - ranitidine (ZANTAC) 300 MG tablet; Take 0.5 tablets (150 mg total) by mouth 2 (two) times daily.  Dispense: 60 tablet; Refill: 5  9. Long-term use of high-risk medication  - Vitamin B12  10. Erectile dysfunction, unspecified erectile dysfunction type  - sildenafil (REVATIO) 20 MG tablet; TAKE 1 TO 5 TABLETS BY MOUTH AS NEEDED  Dispense: 50 tablet; Refill: 1

## 2016-07-15 ENCOUNTER — Ambulatory Visit (INDEPENDENT_AMBULATORY_CARE_PROVIDER_SITE_OTHER): Payer: Managed Care, Other (non HMO) | Admitting: Family Medicine

## 2016-07-15 ENCOUNTER — Encounter: Payer: Self-pay | Admitting: Family Medicine

## 2016-07-15 VITALS — BP 124/82 | HR 78 | Temp 98.5°F | Resp 18 | Ht 68.0 in | Wt 223.7 lb

## 2016-07-15 DIAGNOSIS — R05 Cough: Secondary | ICD-10-CM | POA: Diagnosis not present

## 2016-07-15 DIAGNOSIS — B9789 Other viral agents as the cause of diseases classified elsewhere: Secondary | ICD-10-CM

## 2016-07-15 DIAGNOSIS — R059 Cough, unspecified: Secondary | ICD-10-CM

## 2016-07-15 DIAGNOSIS — R079 Chest pain, unspecified: Secondary | ICD-10-CM | POA: Diagnosis not present

## 2016-07-15 DIAGNOSIS — E1121 Type 2 diabetes mellitus with diabetic nephropathy: Secondary | ICD-10-CM

## 2016-07-15 DIAGNOSIS — J069 Acute upper respiratory infection, unspecified: Secondary | ICD-10-CM

## 2016-07-15 MED ORDER — BUDESONIDE-FORMOTEROL FUMARATE 160-4.5 MCG/ACT IN AERO: 2.0000 | INHALATION_SPRAY | Freq: Two times a day (BID) | RESPIRATORY_TRACT | 0 refills | Status: DC

## 2016-07-15 MED ORDER — BENZONATATE 100 MG PO CAPS: 100.0000 mg | ORAL_CAPSULE | Freq: Three times a day (TID) | ORAL | 0 refills | Status: DC | PRN

## 2016-07-15 NOTE — Progress Notes (Signed)
Name: Jesse Lawrence.   MRN: [id number]    DOB: 23-May-1960   Date:07/15/2016       Progress Note  Subjective  Chief Complaint  Chief Complaint  Patient presents with  . Cough    hoarness, nasal drainage for 2 days  . Chest Pain    HPI  Pt presents with 2 day history of non-productive cough, hoarse voice, sore throat and nasal drainage. He denies fevers/chills, ear pain/fullness, sinus pain/pressure, shortness of breath, vomiting, diarrhea, or abdominal pain. Recent sick contact - wife.  He also reports 9 day history of intermittent substernal chest pain , lasts 12-45 seconds,  that is accompanied by mild nausea.  He denies any pattern to the onset of CP, denies shortness of breath, diaphoresis, or presyncopal/syncopal episodes. He has been taking ranitidine for GERD as prescribed.  He has a history of stent placement in 2002 and 2003 and MI in 2006; pt has not been seeing a cardiologist regularly.   Patient Active Problem List   Diagnosis Date Noted  . OSA (obstructive sleep apnea) 05/21/2015  . Perennial allergic rhinitis 11/07/2014  . Arteriosclerosis of coronary artery 11/07/2014  . CD (contact dermatitis) 11/07/2014  . Decreased libido 11/07/2014  . Diabetes mellitus with renal manifestation ([place]) 11/07/2014  . Gastro-esophageal reflux disease without esophagitis 11/07/2014  . H/O acute myocardial infarction 11/07/2014  . Hypercholesteremia 11/07/2014  . Benign hypertension 11/07/2014  . Hypertriglyceridemia 11/07/2014  . H/O high risk medication treatment 11/07/2014  . NASH (nonalcoholic steatohepatitis) 11/07/2014  . Microalbuminuria 11/07/2014  . Adult BMI 30+ 11/07/2014  . Fungal infection of toenail 11/07/2014  . Tobacco abuse 11/07/2014  . ED (erectile dysfunction) of organic origin 09/29/2006    Past Surgical History:  Procedure Laterality Date  . cadiac stenting    . KNEE ARTHROSCOPY      Family History  Problem Relation Age of Onset  . Diabetes Mother    . CAD Mother   . Heart disease Mother   . Heart disease Father     Social History   Social History  . Marital status: Married    Spouse name: N/A  . Number of children: N/A  . Years of education: N/A   Occupational History  . Not on file.   Social History Main Topics  . Smoking status: Former Smoker    Packs/day: 1.50    Years: 30.00    Types: Cigarettes    Quit date: 04/19/2013  . Smokeless tobacco: Current User  . Alcohol use No  . Drug use: No  . Sexual activity: Yes   Other Topics Concern  . Not on file   Social History Narrative  . No narrative on file     Current Outpatient Prescriptions:  .  atorvastatin (LIPITOR) 80 MG tablet, Take 1 tablet (80 mg total) by mouth daily at 6 PM., Disp: 30 tablet, Rfl: 5 .  baclofen (LIORESAL) 10 MG tablet, Take 1 tablet (10 mg total) by mouth 3 (three) times daily., Disp: 30 each, Rfl: 0 .  clonazePAM (KLONOPIN) 0.5 MG tablet, Take 1 tablet (0.5 mg total) by mouth 2 (two) times daily as needed for anxiety., Disp: 20 tablet, Rfl: 1 .  clopidogrel (PLAVIX) 75 MG tablet, Take 1 tablet (75 mg total) by mouth daily., Disp: 30 tablet, Rfl: 5 .  fluticasone (FLONASE) 50 MCG/ACT nasal spray, Place 2 sprays into both nostrils as needed., Disp: 48 g, Rfl: 1 .  glucose blood (ONE TOUCH ULTRA TEST) test strip,  Use as instructed, Disp: 100 each, Rfl: 12 .  lisinopril (PRINIVIL,ZESTRIL) 10 MG tablet, Take 1 tablet (10 mg total) by mouth daily., Disp: 30 tablet, Rfl: 5 .  loratadine (CLARITIN) 10 MG tablet, Take 1 tablet by mouth daily., Disp: , Rfl:  .  metoprolol succinate (TOPROL-XL) 25 MG 24 hr tablet, Take 1 tablet (25 mg total) by mouth daily., Disp: 30 tablet, Rfl: 5 .  ranitidine (ZANTAC) 300 MG tablet, Take 0.5 tablets (150 mg total) by mouth 2 (two) times daily., Disp: 60 tablet, Rfl: 5 .  sildenafil (REVATIO) 20 MG tablet, TAKE 1 TO 5 TABLETS BY MOUTH AS NEEDED, Disp: 50 tablet, Rfl: 1 .  TRULICITY 1.5 XK/4.8JE SOPN, INJECT 1.5 MG  INTO THE SKIN ONCE WEEKLY, Disp: 4 pen, Rfl: 2 .  VASCEPA 1 g CAPS, TAKE 2 CAPSULES BY MOUTH TWICE DAILY, Disp: 120 capsule, Rfl: 5 .  XIGDUO XR 08-998 MG TB24, Take 1 tablet by mouth 2 (two) times daily., Disp: 60 tablet, Rfl: 5  No Known Allergies   ROS  Constitutional: Negative for fever or weight change.  Respiratory: Positive  for cough and shortness of breath.   Cardiovascular: Positive  for chest pain but no palpitations.  Gastrointestinal: Negative for abdominal pain, no bowel changes.  Musculoskeletal: Negative for gait problem or joint swelling.  Skin: Negative for rash.  Neurological: Negative for dizziness or headache.  No other specific complaints in a complete review of systems (except as listed in HPI above).  Objective  Vitals:   07/15/16 0803  BP: 124/82  Pulse: 78  Resp: 18  Temp: 98.5 F (36.9 C)  TempSrc: Oral  SpO2: 98%  Weight: 223 lb 11.2 oz (101.5 kg)  Height: 5' 8"  (1.727 m)    Body mass index is 34.01 kg/m.  Physical Exam  Constitutional: Patient appears well-developed and well-nourished. Obese  No distress.  HEENT: head atraumatic, normocephalic, pupils equal and reactive to light, ears: normal TM, neck supple, throat within normal limits. Cardiovascular: Normal rate, regular rhythm and normal heart sounds.  No murmur heard. No BLE edema. CP non-reproducible with palpation. Pulmonary/Chest: Effort normal and breath sounds normal. No respiratory distress. Abdominal: Soft.  There is no tenderness. Bowel sounds present x4 quadrants. Psychiatric: Patient has a normal mood and affect. behavior is normal. Judgment and thought content normal.  Recent Results (from the past 2160 hour(s))  POCT HgB A1C     Status: Abnormal   Collection Time: 05/26/16  9:22 AM  Result Value Ref Range   Hemoglobin A1C 7.8   Lipid panel     Status: Abnormal   Collection Time: 05/26/16 10:26 AM  Result Value Ref Range   Cholesterol 169 <200 mg/dL   Triglycerides 537  (H) <150 mg/dL   HDL 26 (L) >40 mg/dL   Total CHOL/HDL Ratio 6.5 (H) <5.0 Ratio   VLDL NOT CALC <30 mg/dL    Comment:   Not calculated due to Triglyceride >400. Suggest ordering Direct LDL (Unit Code: [phone]).    LDL Cholesterol NOT CALC <100 mg/dL    Comment:   Not calculated due to Triglyceride >400. Suggest ordering Direct LDL (Unit Code: [phone]).   COMPLETE METABOLIC PANEL WITH GFR     Status: Abnormal   Collection Time: 05/26/16 10:26 AM  Result Value Ref Range   Sodium 139 135 - 146 mmol/L   Potassium 4.7 3.5 - 5.3 mmol/L   Chloride 103 98 - 110 mmol/L   CO2 27 20 - 31 mmol/L  Glucose, Bld 124 (H) 65 - 99 mg/dL   BUN 11 7 - 25 mg/dL   Creat 0.72 0.70 - 1.33 mg/dL    Comment:   For patients > or = 54 years of age: The upper reference limit for Creatinine is approximately 13% higher for people identified as African-American.      Total Bilirubin 1.1 0.2 - 1.2 mg/dL   Alkaline Phosphatase 66 40 - 115 U/L   AST 20 10 - 35 U/L   ALT 37 9 - 46 U/L   Total Protein 7.3 6.1 - 8.1 g/dL   Albumin 4.7 3.6 - 5.1 g/dL   Calcium 9.7 8.6 - 10.3 mg/dL   GFR, Est African American >89 >=60 mL/min   GFR, Est Non African American >89 >=60 mL/min  Vitamin B12     Status: None   Collection Time: 05/26/16 10:26 AM  Result Value Ref Range   Vitamin B-12 996 200 - 1,100 pg/mL    PHQ2/9: Depression screen [facility] 2/9 05/26/2016 11/16/2015 08/17/2015 07/04/2015 05/21/2015  Decreased Interest 0 0 0 0 0  Down, Depressed, Hopeless 0 0 0 0 0  PHQ - 2 Score 0 0 0 0 0     Fall Risk: Fall Risk  05/26/2016 11/16/2015 08/17/2015 07/04/2015 05/21/2015  Falls in the past year? No No No No No     Assessment & Plan  1. Intermittent chest pain  - EKG 12-Lead : normal EKG, but advised him to go to [facility] if symptoms returns.  - Ambulatory referral to Cardiology   2. Viral upper respiratory tract infection  - budesonide-formoterol (SYMBICORT) 160-4.5 MCG/ACT inhaler; Inhale 2 puffs into the lungs 2 (two)  times daily.  Dispense: 1 Inhaler; Refill: 0  3. Cough  - budesonide-formoterol (SYMBICORT) 160-4.5 MCG/ACT inhaler; Inhale 2 puffs into the lungs 2 (two) times daily.  Dispense: 1 Inhaler; Refill: 0 - benzonatate (TESSALON) 100 MG capsule; Take 1-2 capsules (100-200 mg total) by mouth 3 (three) times daily as needed.  Dispense: 40 capsule; Refill: 0  4. Type 2 diabetes mellitus with diabetic nephropathy, without long-term current use of insulin (HCC)  Try to avoid sweets, drink plenty of water and monitor glucose at home

## 2016-07-23 ENCOUNTER — Encounter: Payer: Self-pay | Admitting: Emergency Medicine

## 2016-07-23 ENCOUNTER — Inpatient Hospital Stay
Admission: EM | Admit: 2016-07-23 | Discharge: 2016-07-24 | DRG: 871 | Disposition: A | Discharge status: Home / Self Care | Payer: Managed Care, Other (non HMO) | Attending: Internal Medicine | Admitting: Internal Medicine

## 2016-07-23 ENCOUNTER — Emergency Department: Payer: Managed Care, Other (non HMO)

## 2016-07-23 DIAGNOSIS — E669 Obesity, unspecified: Secondary | ICD-10-CM | POA: Diagnosis present

## 2016-07-23 DIAGNOSIS — I10 Essential (primary) hypertension: Secondary | ICD-10-CM | POA: Diagnosis present

## 2016-07-23 DIAGNOSIS — F17290 Nicotine dependence, other tobacco product, uncomplicated: Secondary | ICD-10-CM | POA: Diagnosis present

## 2016-07-23 DIAGNOSIS — E785 Hyperlipidemia, unspecified: Secondary | ICD-10-CM | POA: Diagnosis present

## 2016-07-23 DIAGNOSIS — Z6835 Body mass index (BMI) 35.0-35.9, adult: Secondary | ICD-10-CM

## 2016-07-23 DIAGNOSIS — J189 Pneumonia, unspecified organism: Secondary | ICD-10-CM | POA: Diagnosis present

## 2016-07-23 DIAGNOSIS — Z7984 Long term (current) use of oral hypoglycemic drugs: Secondary | ICD-10-CM

## 2016-07-23 DIAGNOSIS — A419 Sepsis, unspecified organism: Secondary | ICD-10-CM | POA: Diagnosis not present

## 2016-07-23 DIAGNOSIS — K219 Gastro-esophageal reflux disease without esophagitis: Secondary | ICD-10-CM | POA: Diagnosis present

## 2016-07-23 DIAGNOSIS — E119 Type 2 diabetes mellitus without complications: Secondary | ICD-10-CM | POA: Diagnosis present

## 2016-07-23 DIAGNOSIS — N529 Male erectile dysfunction, unspecified: Secondary | ICD-10-CM | POA: Diagnosis present

## 2016-07-23 DIAGNOSIS — Z8249 Family history of ischemic heart disease and other diseases of the circulatory system: Secondary | ICD-10-CM

## 2016-07-23 DIAGNOSIS — J440 Chronic obstructive pulmonary disease with acute lower respiratory infection: Secondary | ICD-10-CM | POA: Diagnosis present

## 2016-07-23 DIAGNOSIS — I252 Old myocardial infarction: Secondary | ICD-10-CM

## 2016-07-23 DIAGNOSIS — Z833 Family history of diabetes mellitus: Secondary | ICD-10-CM

## 2016-07-23 DIAGNOSIS — I2510 Atherosclerotic heart disease of native coronary artery without angina pectoris: Secondary | ICD-10-CM | POA: Diagnosis present

## 2016-07-23 DIAGNOSIS — Z79899 Other long term (current) drug therapy: Secondary | ICD-10-CM

## 2016-07-23 DIAGNOSIS — Z7951 Long term (current) use of inhaled steroids: Secondary | ICD-10-CM

## 2016-07-23 DIAGNOSIS — Z7902 Long term (current) use of antithrombotics/antiplatelets: Secondary | ICD-10-CM

## 2016-07-23 HISTORY — DX: Acute myocardial infarction, unspecified: I21.9

## 2016-07-23 LAB — CBC
HEMATOCRIT: 39.4 % — AB (ref 40.0–52.0)
Hemoglobin: 13.9 g/dL (ref 13.0–18.0)
MCH: 32.2 pg (ref 26.0–34.0)
MCHC: 35.3 g/dL (ref 32.0–36.0)
MCV: 91.2 fL (ref 80.0–100.0)
Platelets: 222 10*3/uL (ref 150–440)
RBC: 4.32 MIL/uL — ABNORMAL LOW (ref 4.40–5.90)
RDW: 12.4 % (ref 11.5–14.5)
WBC: 7.4 10*3/uL (ref 3.8–10.6)

## 2016-07-23 LAB — BASIC METABOLIC PANEL
Anion gap: 9 (ref 5–15)
BUN: 19 mg/dL (ref 6–20)
CHLORIDE: 103 mmol/L (ref 101–111)
CO2: 23 mmol/L (ref 22–32)
Calcium: 9.1 mg/dL (ref 8.9–10.3)
Creatinine, Ser: 0.95 mg/dL (ref 0.61–1.24)
GFR calc Af Amer: >60 mL/min (ref >60)
GFR calc non Af Amer: >60 mL/min (ref >60)
GLUCOSE: 396 mg/dL — AB (ref 65–99)
POTASSIUM: 4 mmol/L (ref 3.5–5.1)
Sodium: 135 mmol/L (ref 135–145)

## 2016-07-23 LAB — INFLUENZA PANEL BY PCR (TYPE A & B)
Influenza A By PCR: NEGATIVE
Influenza B By PCR: NEGATIVE

## 2016-07-23 LAB — LACTIC ACID, PLASMA: Lactic Acid, Venous: 2 mmol/L (ref 0.5–1.9)

## 2016-07-23 LAB — TROPONIN I: Troponin I: <0.03 ng/mL (ref <0.03)

## 2016-07-23 MED ORDER — SODIUM CHLORIDE 0.9 % IV BOLUS (SEPSIS)
1000.0000 mL | Freq: Once | INTRAVENOUS | Status: AC
  Administered 2016-07-24: 1000 mL via INTRAVENOUS

## 2016-07-23 MED ORDER — SODIUM CHLORIDE 0.9 % IV BOLUS (SEPSIS)
1000.0000 mL | Freq: Once | INTRAVENOUS | Status: AC
  Administered 2016-07-23: 1000 mL via INTRAVENOUS

## 2016-07-23 MED ORDER — DEXTROSE 5 % IV SOLN
1.0000 g | INTRAVENOUS | Status: DC
  Filled 2016-07-23: qty 10

## 2016-07-23 MED ORDER — SODIUM CHLORIDE 0.9 % IV BOLUS (SEPSIS): 500.0000 mL | Freq: Once | INTRAVENOUS | Status: DC

## 2016-07-23 MED ORDER — CEFTRIAXONE SODIUM-DEXTROSE 1-3.74 GM-% IV SOLR
1.0000 g | Freq: Once | INTRAVENOUS | Status: AC
  Administered 2016-07-23: 1 g via INTRAVENOUS

## 2016-07-23 MED ORDER — CEFTRIAXONE SODIUM-DEXTROSE 1-3.74 GM-% IV SOLR
INTRAVENOUS | Status: AC
  Administered 2016-07-23: 1 g via INTRAVENOUS
  Filled 2016-07-23: qty 50

## 2016-07-23 MED ORDER — DEXTROSE 5 % IV SOLN
500.0000 mg | INTRAVENOUS | Status: DC
  Filled 2016-07-23: qty 500

## 2016-07-23 MED ORDER — DEXTROSE 5 % IV SOLN: 1.0000 g | Freq: Once | INTRAVENOUS | Status: DC

## 2016-07-23 MED ORDER — DEXTROSE 5 % IV SOLN
500.0000 mg | Freq: Once | INTRAVENOUS | Status: AC
  Administered 2016-07-23: 500 mg via INTRAVENOUS
  Filled 2016-07-23: qty 500

## 2016-07-23 NOTE — Progress Notes (Signed)
Pharmacy Antibiotic Note  Jesse Lawrence. is a 56 y.o. male admitted on 07/23/2016 with pneumonia.  Pharmacy has been consulted for ceftriaxone and azithromycin dosing.  Plan: Ceftriaxone 1 gram q 24 hours ordered. Azithromycin 500 mg IV q 24 hours ordered.  Height: 5' 8"  (172.7 cm) Weight: 230 lb (104.3 kg) IBW/kg (Calculated) : 68.4  Temp (24hrs), Avg:100.9 F (38.3 C), Min:100.9 F (38.3 C), Max:100.9 F (38.3 C)   Recent Labs Lab 07/23/16 2146  WBC 7.4  CREATININE 0.95    Estimated Creatinine Clearance: 105.3 mL/min (by C-G formula based on SCr of 0.95 mg/dL).    No Known Allergies  Antimicrobials this admission: ceftriaxone azithromycin 4/18 >>    >>   Dose adjustments this admission:   Microbiology results: 4/18 BCx: pending      4/18 CXR: atelectasis vs. infection Thank you for allowing pharmacy to be a part of this patient's care.  Mahalia Dykes S 07/23/2016 10:56 PM

## 2016-07-23 NOTE — ED Triage Notes (Addendum)
Patient ambulatory to triage with steady gait, without difficulty or distress noted; pt reports prod cough white sputum x week; dx with allergies to pollen week ago; 2 days ago began having fever and mid CP; rx symbicort and tessalon but symptoms persists; st with exertion can feels whooshing in his ears like his heart beating; hx MI in 2006

## 2016-07-23 NOTE — ED Provider Notes (Signed)
[facility] Emergency Department Provider Note   ____________________________________________   I have reviewed the triage vital signs and the nursing notes.   HISTORY  Chief Complaint Chest Pain and Cough   History limited by: Not Limited   HPI Jesse Lawrence. is a 56 y.o. male who presents to the emergency department today because of concerns for fever, shortness breath and chest pain. The patient has had these symptoms for the past 2 days. However he has had some cough for roughly the past week. He was given prescription for Symbicort and Tessalon [place]. It is only been for the past few days that he has had fevers. He is not sure how high they have been. Has had associated cough productive of white sputum. His wife was recently sick with bronchitis.    Past Medical History:  Diagnosis Date  . Allergy   . Anxiety   . COPD (chronic obstructive pulmonary disease) ([place])   . Diabetes mellitus without complication ([place])   . Hyperlipidemia   . Hypertension   . Male impotence   . MI (myocardial infarction) ([place])   . Obesity     Patient Active Problem List   Diagnosis Date Noted  . OSA (obstructive sleep apnea) 05/21/2015  . Perennial allergic rhinitis 11/07/2014  . Arteriosclerosis of coronary artery 11/07/2014  . CD (contact dermatitis) 11/07/2014  . Decreased libido 11/07/2014  . Diabetes mellitus with renal manifestation ([place]) 11/07/2014  . Gastro-esophageal reflux disease without esophagitis 11/07/2014  . H/O acute myocardial infarction 11/07/2014  . Hypercholesteremia 11/07/2014  . Benign hypertension 11/07/2014  . Hypertriglyceridemia 11/07/2014  . H/O high risk medication treatment 11/07/2014  . NASH (nonalcoholic steatohepatitis) 11/07/2014  . Microalbuminuria 11/07/2014  . Adult BMI 30+ 11/07/2014  . Fungal infection of toenail 11/07/2014  . Tobacco abuse 11/07/2014  . ED (erectile dysfunction) of organic origin 09/29/2006     Past Surgical History:  Procedure Laterality Date  . cadiac stenting    . KNEE ARTHROSCOPY      Prior to Admission medications   Medication Sig Start Date End Date Taking? Authorizing Provider  atorvastatin (LIPITOR) 80 MG tablet Take 1 tablet (80 mg total) by mouth daily at 6 PM. 02/22/16   Steele Sizer, MD  baclofen (LIORESAL) 10 MG tablet Take 1 tablet (10 mg total) by mouth 3 (three) times daily. 11/16/15   Steele Sizer, MD  benzonatate (TESSALON) 100 MG capsule Take 1-2 capsules (100-200 mg total) by mouth 3 (three) times daily as needed. 07/15/16   Steele Sizer, MD  budesonide-formoterol (SYMBICORT) 160-4.5 MCG/ACT inhaler Inhale 2 puffs into the lungs 2 (two) times daily. 07/15/16   Steele Sizer, MD  clonazePAM (KLONOPIN) 0.5 MG tablet Take 1 tablet (0.5 mg total) by mouth 2 (two) times daily as needed for anxiety. 10/10/14   Steele Sizer, MD  clopidogrel (PLAVIX) 75 MG tablet Take 1 tablet (75 mg total) by mouth daily. 05/26/16   Steele Sizer, MD  fluticasone (FLONASE) 50 MCG/ACT nasal spray Place 2 sprays into both nostrils as needed. 11/10/14   Steele Sizer, MD  glucose blood (ONE TOUCH ULTRA TEST) test strip Use as instructed 08/17/15   Steele Sizer, MD  lisinopril (PRINIVIL,ZESTRIL) 10 MG tablet Take 1 tablet (10 mg total) by mouth daily. 05/26/16   Steele Sizer, MD  loratadine (CLARITIN) 10 MG tablet Take 1 tablet by mouth daily. 08/09/12   Historical Provider, MD  metoprolol succinate (TOPROL-XL) 25 MG 24 hr tablet Take 1 tablet (25 mg  total) by mouth daily. 05/26/16   Steele Sizer, MD  ranitidine (ZANTAC) 300 MG tablet Take 0.5 tablets (150 mg total) by mouth 2 (two) times daily. 05/26/16   Steele Sizer, MD  sildenafil (REVATIO) 20 MG tablet TAKE 1 TO 5 TABLETS BY MOUTH AS NEEDED 05/26/16   Steele Sizer, MD  TRULICITY 1.5 GT/3.6IW SOPN INJECT 1.5 MG INTO THE SKIN ONCE WEEKLY 05/23/16   Steele Sizer, MD  VASCEPA 1 g CAPS TAKE 2 CAPSULES BY MOUTH TWICE DAILY 04/28/16    Steele Sizer, MD  XIGDUO XR 08-998 MG TB24 Take 1 tablet by mouth 2 (two) times daily. 05/26/16   Steele Sizer, MD    Allergies Patient has no known allergies.  Family History  Problem Relation Age of Onset  . Diabetes Mother   . CAD Mother   . Heart disease Mother   . Heart disease Father     Social History Social History  Substance Use Topics  . Smoking status: Former Smoker    Packs/day: 1.50    Years: 30.00    Types: Cigarettes    Quit date: 04/19/2013  . Smokeless tobacco: Current User  . Alcohol use No    Review of Systems  Constitutional: Negative for fever. Cardiovascular: Negative for chest pain. Respiratory: Positive for shortness of breath and cough. Gastrointestinal: Negative for abdominal pain, vomiting and diarrhea. Genitourinary: Negative for dysuria. Musculoskeletal: Negative for back pain. Skin: Negative for rash. Neurological: Negative for headaches, focal weakness or numbness.  10-point ROS otherwise negative.  ____________________________________________   PHYSICAL EXAM:  VITAL SIGNS: ED Triage Vitals  Enc Vitals Group     BP 07/23/16 2148 (!) 149/63     Pulse Rate 07/23/16 2148 (!) 123     Resp 07/23/16 2148 18     Temp 07/23/16 2148 (!) 100.9 F (38.3 C)     Temp Source 07/23/16 2148 Oral     SpO2 07/23/16 2148 93 %     Weight 07/23/16 2148 230 lb (104.3 kg)     Height 07/23/16 2148 5' 8"  (1.727 m)     Head Circumference --      Peak Flow --      Pain Score 07/23/16 2147 4   Constitutional: Alert and oriented. Well appearing and in no distress. Eyes: Conjunctivae are normal. Normal extraocular movements. ENT   Head: Normocephalic and atraumatic.   Nose: No congestion/rhinnorhea.   Mouth/Throat: Mucous membranes are moist.   Neck: No stridor. Hematological/Lymphatic/Immunilogical: No cervical lymphadenopathy. Cardiovascular: Tachycardic. regular rhythm.  No murmurs, rubs, or gallops.  Respiratory: Normal  respiratory effort without tachypnea nor retractions. Breath sounds are clear and equal bilaterally. Occasional cough. Gastrointestinal: Soft and non tender. No rebound. No guarding.  Genitourinary: Deferred Musculoskeletal: Normal range of motion in all extremities. No lower extremity edema. Neurologic:  Normal speech and language. No gross focal neurologic deficits are appreciated.  Skin:  Skin is warm, dry and intact. No rash noted. Psychiatric: Mood and affect are normal. Speech and behavior are normal. Patient exhibits appropriate insight and judgment.  ____________________________________________    LABS (pertinent positives/negatives)  Labs Reviewed  BASIC METABOLIC PANEL - Abnormal; Notable for the following:       Result Value   Glucose, Bld 396 (*)    All other components within normal limits  CBC - Abnormal; Notable for the following:    RBC 4.32 (*)    HCT 39.4 (*)    All other components within normal limits  LACTIC ACID, PLASMA -  Abnormal; Notable for the following:    Lactic Acid, Venous 2.0 (*)    All other components within normal limits  CULTURE, BLOOD (ROUTINE X 2)  CULTURE, BLOOD (ROUTINE X 2)  TROPONIN I  INFLUENZA PANEL BY PCR (TYPE A & B)  LACTIC ACID, PLASMA     ____________________________________________   EKG  None  ____________________________________________    RADIOLOGY  CXR IMPRESSION:  Minimal right midlung and left basilar airspace opacities may  reflect atelectasis or possibly mild infection.   ____________________________________________   PROCEDURES  Procedures  ____________________________________________   INITIAL IMPRESSION / ASSESSMENT AND PLAN / ED COURSE  Pertinent labs & imaging results that were available during my care of the patient were reviewed by me and considered in my medical decision making (see chart for details).  He presented to the emergency department today because of concerns for fever,  shortness of breath. Workup here does show pneumonia. Patient's initial vital signs with tachycardia and fever are concerning first Sirs. Given this combination I do have some concern for sepsis. Lactate was checked and was elevated at 2. Suicidal very significant elevation. Patient will be given fluids. Plan on rechecking lactic after IV fluids.  ____________________________________________   FINAL CLINICAL IMPRESSION(S) / ED DIAGNOSES  Pneumonia  Note: This dictation was prepared with Dragon dictation. Any transcriptional errors that result from this process are unintentional     Nance Pear, MD 07/24/16 0005

## 2016-07-23 NOTE — ED Notes (Signed)
Code sepsis was called Maudie Mercury at [facility] per Dr. Gennette Pac verbal orders.

## 2016-07-24 DIAGNOSIS — Z7984 Long term (current) use of oral hypoglycemic drugs: Secondary | ICD-10-CM | POA: Diagnosis not present

## 2016-07-24 DIAGNOSIS — F17290 Nicotine dependence, other tobacco product, uncomplicated: Secondary | ICD-10-CM | POA: Diagnosis present

## 2016-07-24 DIAGNOSIS — A419 Sepsis, unspecified organism: Secondary | ICD-10-CM | POA: Diagnosis present

## 2016-07-24 DIAGNOSIS — I10 Essential (primary) hypertension: Secondary | ICD-10-CM | POA: Diagnosis present

## 2016-07-24 DIAGNOSIS — N529 Male erectile dysfunction, unspecified: Secondary | ICD-10-CM | POA: Diagnosis present

## 2016-07-24 DIAGNOSIS — Z8249 Family history of ischemic heart disease and other diseases of the circulatory system: Secondary | ICD-10-CM | POA: Diagnosis not present

## 2016-07-24 DIAGNOSIS — E785 Hyperlipidemia, unspecified: Secondary | ICD-10-CM | POA: Diagnosis present

## 2016-07-24 DIAGNOSIS — Z6835 Body mass index (BMI) 35.0-35.9, adult: Secondary | ICD-10-CM | POA: Diagnosis not present

## 2016-07-24 DIAGNOSIS — E119 Type 2 diabetes mellitus without complications: Secondary | ICD-10-CM | POA: Diagnosis present

## 2016-07-24 DIAGNOSIS — I2510 Atherosclerotic heart disease of native coronary artery without angina pectoris: Secondary | ICD-10-CM | POA: Diagnosis present

## 2016-07-24 DIAGNOSIS — J189 Pneumonia, unspecified organism: Secondary | ICD-10-CM | POA: Diagnosis present

## 2016-07-24 DIAGNOSIS — Z833 Family history of diabetes mellitus: Secondary | ICD-10-CM | POA: Diagnosis not present

## 2016-07-24 DIAGNOSIS — I252 Old myocardial infarction: Secondary | ICD-10-CM | POA: Diagnosis not present

## 2016-07-24 DIAGNOSIS — Z79899 Other long term (current) drug therapy: Secondary | ICD-10-CM | POA: Diagnosis not present

## 2016-07-24 DIAGNOSIS — J440 Chronic obstructive pulmonary disease with acute lower respiratory infection: Secondary | ICD-10-CM | POA: Diagnosis present

## 2016-07-24 DIAGNOSIS — Z7902 Long term (current) use of antithrombotics/antiplatelets: Secondary | ICD-10-CM | POA: Diagnosis not present

## 2016-07-24 DIAGNOSIS — K219 Gastro-esophageal reflux disease without esophagitis: Secondary | ICD-10-CM | POA: Diagnosis present

## 2016-07-24 DIAGNOSIS — E669 Obesity, unspecified: Secondary | ICD-10-CM | POA: Diagnosis present

## 2016-07-24 DIAGNOSIS — Z7951 Long term (current) use of inhaled steroids: Secondary | ICD-10-CM | POA: Diagnosis not present

## 2016-07-24 LAB — GLUCOSE, CAPILLARY
Glucose-Capillary: 127 mg/dL — ABNORMAL HIGH (ref 65–99)
Glucose-Capillary: 155 mg/dL — ABNORMAL HIGH (ref 65–99)
Glucose-Capillary: 121 mg/dL — ABNORMAL HIGH (ref 65–99)

## 2016-07-24 LAB — LACTIC ACID, PLASMA: Lactic Acid, Venous: 1.8 mmol/L (ref 0.5–1.9)

## 2016-07-24 LAB — TSH: TSH: 1.655 u[IU]/mL (ref 0.350–4.500)

## 2016-07-24 MED ORDER — CEFUROXIME AXETIL 500 MG PO TABS: 500.0000 mg | ORAL_TABLET | Freq: Two times a day (BID) | ORAL | 0 refills | Status: DC

## 2016-07-24 MED ORDER — SODIUM CHLORIDE 0.9 % IV SOLN
INTRAVENOUS | Status: DC
  Administered 2016-07-24 (×2): via INTRAVENOUS

## 2016-07-24 MED ORDER — CLONAZEPAM 0.5 MG PO TABS: 0.5000 mg | ORAL_TABLET | Freq: Two times a day (BID) | ORAL | Status: DC | PRN

## 2016-07-24 MED ORDER — LISINOPRIL 10 MG PO TABS
10.0000 mg | ORAL_TABLET | Freq: Every day | ORAL | Status: DC
  Administered 2016-07-24: 10:00:00 10 mg via ORAL
  Filled 2016-07-24: qty 1

## 2016-07-24 MED ORDER — ONDANSETRON HCL 4 MG/2ML IJ SOLN: 4.0000 mg | Freq: Four times a day (QID) | INTRAMUSCULAR | Status: DC | PRN

## 2016-07-24 MED ORDER — AZITHROMYCIN 250 MG PO TABS: 250.0000 mg | ORAL_TABLET | Freq: Every day | ORAL | 0 refills | Status: DC

## 2016-07-24 MED ORDER — ACETAMINOPHEN 325 MG PO TABS: 650.0000 mg | ORAL_TABLET | Freq: Four times a day (QID) | ORAL | Status: DC | PRN

## 2016-07-24 MED ORDER — FAMOTIDINE 20 MG PO TABS: 40.0000 mg | ORAL_TABLET | Freq: Every day | ORAL | Status: DC

## 2016-07-24 MED ORDER — DOCUSATE SODIUM 100 MG PO CAPS
100.0000 mg | ORAL_CAPSULE | Freq: Two times a day (BID) | ORAL | Status: DC
Start: 2016-07-24 — End: 2016-07-24
  Administered 2016-07-24: 10:00:00 100 mg via ORAL
  Filled 2016-07-24: qty 1

## 2016-07-24 MED ORDER — CLOPIDOGREL BISULFATE 75 MG PO TABS
75.0000 mg | ORAL_TABLET | Freq: Every day | ORAL | Status: DC
  Administered 2016-07-24: 10:00:00 75 mg via ORAL
  Filled 2016-07-24: qty 1

## 2016-07-24 MED ORDER — INSULIN ASPART 100 UNIT/ML ~~LOC~~ SOLN: 0.0000 [IU] | Freq: Every day | SUBCUTANEOUS | Status: DC

## 2016-07-24 MED ORDER — ATORVASTATIN CALCIUM 20 MG PO TABS: 80.0000 mg | ORAL_TABLET | Freq: Every day | ORAL | Status: DC

## 2016-07-24 MED ORDER — ONDANSETRON HCL 4 MG PO TABS: 4.0000 mg | ORAL_TABLET | Freq: Four times a day (QID) | ORAL | Status: DC | PRN

## 2016-07-24 MED ORDER — SODIUM CHLORIDE 0.9% FLUSH: 3.0000 mL | Freq: Two times a day (BID) | INTRAVENOUS | Status: DC

## 2016-07-24 MED ORDER — MOMETASONE FURO-FORMOTEROL FUM 200-5 MCG/ACT IN AERO
2.0000 | INHALATION_SPRAY | Freq: Two times a day (BID) | RESPIRATORY_TRACT | Status: DC
  Administered 2016-07-24: 2 via RESPIRATORY_TRACT
  Filled 2016-07-24: qty 8.8

## 2016-07-24 MED ORDER — CEFUROXIME AXETIL 500 MG PO TABS: 500.0000 mg | ORAL_TABLET | Freq: Two times a day (BID) | ORAL | Status: DC

## 2016-07-24 MED ORDER — AZITHROMYCIN 500 MG PO TABS: 250.0000 mg | ORAL_TABLET | Freq: Every day | ORAL | Status: DC

## 2016-07-24 MED ORDER — LORATADINE 10 MG PO TABS
10.0000 mg | ORAL_TABLET | Freq: Every day | ORAL | Status: DC
  Administered 2016-07-24: 10:00:00 10 mg via ORAL
  Filled 2016-07-24: qty 1

## 2016-07-24 MED ORDER — FLUTICASONE PROPIONATE 50 MCG/ACT NA SUSP
2.0000 | NASAL | Status: DC | PRN
  Filled 2016-07-24: qty 16

## 2016-07-24 MED ORDER — METOPROLOL SUCCINATE ER 25 MG PO TB24
25.0000 mg | ORAL_TABLET | Freq: Every day | ORAL | Status: DC
  Administered 2016-07-24: 25 mg via ORAL
  Filled 2016-07-24: qty 1

## 2016-07-24 MED ORDER — ENOXAPARIN SODIUM 40 MG/0.4ML ~~LOC~~ SOLN: 40.0000 mg | SUBCUTANEOUS | Status: DC

## 2016-07-24 MED ORDER — ICOSAPENT ETHYL 1 G PO CAPS: 2.0000 | ORAL_CAPSULE | Freq: Two times a day (BID) | ORAL | Status: DC

## 2016-07-24 MED ORDER — OMEGA-3-ACID ETHYL ESTERS 1 G PO CAPS
2.0000 g | ORAL_CAPSULE | Freq: Two times a day (BID) | ORAL | Status: DC
  Administered 2016-07-24: 2 g via ORAL
  Filled 2016-07-24: qty 2

## 2016-07-24 MED ORDER — INSULIN ASPART 100 UNIT/ML ~~LOC~~ SOLN
0.0000 [IU] | Freq: Three times a day (TID) | SUBCUTANEOUS | Status: DC
  Administered 2016-07-24: 08:00:00 4 [IU] via SUBCUTANEOUS
  Administered 2016-07-24: 13:00:00 3 [IU] via SUBCUTANEOUS
  Filled 2016-07-24: qty 3
  Filled 2016-07-24: qty 4

## 2016-07-24 MED ORDER — BACLOFEN 10 MG PO TABS
10.0000 mg | ORAL_TABLET | Freq: Three times a day (TID) | ORAL | Status: DC
  Filled 2016-07-24: qty 1

## 2016-07-24 MED ORDER — ACETAMINOPHEN 650 MG RE SUPP: 650.0000 mg | Freq: Four times a day (QID) | RECTAL | Status: DC | PRN

## 2016-07-24 NOTE — Discharge Summary (Signed)
[facility] NAME: Jesse Lawrence    MR#:  [id number]  DATE OF BIRTH:  1961-03-01  DATE OF ADMISSION:  07/23/2016 ADMITTING PHYSICIAN: Harrie Foreman, MD  DATE OF DISCHARGE: 07/24/16  PRIMARY CARE PHYSICIAN: Loistine Chance, MD    ADMISSION DIAGNOSIS:  congestion  DISCHARGE DIAGNOSIS:  Sepsis on admission-resolved Bibasilar pneumonia COPD SECONDARY DIAGNOSIS:   Past Medical History:  Diagnosis Date  . Allergy   . Anxiety   . COPD (chronic obstructive pulmonary disease) ([place])   . Diabetes mellitus without complication ([place])   . Hyperlipidemia   . Hypertension   . Male impotence   . MI (myocardial infarction) ([place])   . Obesity     HOSPITAL COURSE:   56 year old male with h/o Dm and HTN admitted for sepsis secondary to pneumonia.  1. Sepsis: - patient meets criteria via fever, tachycardia and tachypnea. He is hemodynamically stable -received IVF - Follow blood cultures negative so far -change to oral ceftin and zithromax  2. Pneumonia: Community-acquired; continue ceftriaxone and azithromycin.---change to oral abxs  3. Diabetes mellitus type 2: resumed oral hypoglycemic agents. Sliding scale insulin while hospitalized -eating ok  4. Essential hypertension: Controlled; continue Toprol and lisinopril  5. Coronary artery disease: The patient has had an MI in the past. Continue Plavix  6. COPD: Continue inhaled corticosteroid. Albuterol as needed.  7. Dyslipidemia: Continue fiber therapy as well as statin therapy  8. DVT prophylaxis: Lovenox  9. GI prophylaxis: Pepcid  sats good. Vitals stable D/c home. Pt agreeable CONSULTS OBTAINED:    DRUG ALLERGIES:  No Known Allergies  DISCHARGE MEDICATIONS:   Current Discharge Medication List    START taking these medications   Details  azithromycin (ZITHROMAX) 250 MG tablet Take 1 tablet (250 mg total) by mouth daily. Qty: 4 tablet, Refills: 0     cefUROXime (CEFTIN) 500 MG tablet Take 1 tablet (500 mg total) by mouth 2 (two) times daily with a meal. Qty: 12 tablet, Refills: 0      CONTINUE these medications which have NOT CHANGED   Details  atorvastatin (LIPITOR) 80 MG tablet Take 1 tablet (80 mg total) by mouth daily at 6 PM. Qty: 30 tablet, Refills: 5   Associated Diagnoses: Hypercholesteremia    baclofen (LIORESAL) 10 MG tablet Take 1 tablet (10 mg total) by mouth 3 (three) times daily. Qty: 30 each, Refills: 0   Associated Diagnoses: Back spasm    benzonatate (TESSALON) 100 MG capsule Take 1-2 capsules (100-200 mg total) by mouth 3 (three) times daily as needed. Qty: 40 capsule, Refills: 0   Associated Diagnoses: Cough    budesonide-formoterol (SYMBICORT) 160-4.5 MCG/ACT inhaler Inhale 2 puffs into the lungs 2 (two) times daily. Qty: 1 Inhaler, Refills: 0   Associated Diagnoses: Viral upper respiratory tract infection; Cough    clonazePAM (KLONOPIN) 0.5 MG tablet Take 1 tablet (0.5 mg total) by mouth 2 (two) times daily as needed for anxiety. Qty: 20 tablet, Refills: 1    clopidogrel (PLAVIX) 75 MG tablet Take 1 tablet (75 mg total) by mouth daily. Qty: 30 tablet, Refills: 5   Associated Diagnoses: Arteriosclerosis of coronary artery    fluticasone (FLONASE) 50 MCG/ACT nasal spray Place 2 sprays into both nostrils as needed. Qty: 48 g, Refills: 1   Associated Diagnoses: Perennial allergic rhinitis    glucose blood (ONE TOUCH ULTRA TEST) test strip Use as instructed Qty: 100 each, Refills: 12   Associated Diagnoses:  Type 2 diabetes mellitus with diabetic nephropathy, without long-term current use of insulin (HCC)    lisinopril (PRINIVIL,ZESTRIL) 10 MG tablet Take 1 tablet (10 mg total) by mouth daily. Qty: 30 tablet, Refills: 5   Associated Diagnoses: Benign hypertension; Type 2 diabetes mellitus with diabetic nephropathy, without long-term current use of insulin (HCC)    loratadine (CLARITIN) 10 MG tablet Take 1  tablet by mouth daily.    metoprolol succinate (TOPROL-XL) 25 MG 24 hr tablet Take 1 tablet (25 mg total) by mouth daily. Qty: 30 tablet, Refills: 5   Associated Diagnoses: Arteriosclerosis of coronary artery; Benign hypertension    ranitidine (ZANTAC) 300 MG tablet Take 0.5 tablets (150 mg total) by mouth 2 (two) times daily. Qty: 60 tablet, Refills: 5   Associated Diagnoses: Gastro-esophageal reflux disease without esophagitis    sildenafil (REVATIO) 20 MG tablet TAKE 1 TO 5 TABLETS BY MOUTH AS NEEDED Qty: 50 tablet, Refills: 1   Associated Diagnoses: Erectile dysfunction, unspecified erectile dysfunction type    TRULICITY 1.5 IO/0.3TD SOPN INJECT 1.5 MG INTO THE SKIN ONCE WEEKLY Qty: 4 pen, Refills: 2    VASCEPA 1 g CAPS TAKE 2 CAPSULES BY MOUTH TWICE DAILY Qty: 120 capsule, Refills: 5   Associated Diagnoses: Hypertriglyceridemia; Dyslipidemia associated with type 2 diabetes mellitus (HCC)    XIGDUO XR 08-998 MG TB24 Take 1 tablet by mouth 2 (two) times daily. Qty: 60 tablet, Refills: 5   Associated Diagnoses: Type 2 diabetes mellitus with diabetic nephropathy, without long-term current use of insulin ([place]); Dyslipidemia associated with type 2 diabetes mellitus ([place])        If you experience worsening of your admission symptoms, develop shortness of breath, life threatening emergency, suicidal or homicidal thoughts you must seek medical attention immediately by calling 911 or calling your MD immediately  if symptoms less severe.  You Must read complete instructions/literature along with all the possible adverse reactions/side effects for all the Medicines you take and that have been prescribed to you. Take any new Medicines after you have completely understood and accept all the possible adverse reactions/side effects.   Please note  You were cared for by a hospitalist during your hospital stay. If you have any questions about your discharge medications or the care you received  while you were in the hospital after you are discharged, you can call the unit and asked to speak with the hospitalist on call if the hospitalist that took care of you is not available. Once you are discharged, your primary care physician will handle any further medical issues. Please note that NO REFILLS for any discharge medications will be authorized once you are discharged, as it is imperative that you return to your primary care physician (or establish a relationship with a primary care physician if you do not have one) for your aftercare needs so that they can reassess your need for medications and monitor your lab values. Today   SUBJECTIVE  Feels better   VITAL SIGNS:  Blood pressure 103/63, pulse 81, temperature 98 F (36.7 C), temperature source Oral, resp. rate 17, height 5' 8"  (1.727 m), weight 104.3 kg (230 lb), SpO2 96 %.  I/O:   Intake/Output Summary (Last 24 hours) at 07/24/16 1407 Last data filed at 07/24/16 0748  Gross per 24 hour  Intake             3550 ml  Output              400 ml  Net             3150 ml    PHYSICAL EXAMINATION:  GENERAL:  56 y.o.-year-old patient lying in the bed with no acute distress. obese EYES: Pupils equal, round, reactive to light and accommodation. No scleral icterus. Extraocular muscles intact.  HEENT: Head atraumatic, normocephalic. Oropharynx and nasopharynx clear.  NECK:  Supple, no jugular venous distention. No thyroid enlargement, no tenderness.  LUNGS: Normal breath sounds bilaterally, no wheezing, rales,rhonchi or crepitation. No use of accessory muscles of respiration.  CARDIOVASCULAR: S1, S2 normal. No murmurs, rubs, or gallops.  ABDOMEN: Soft, non-tender, non-distended. Bowel sounds present. No organomegaly or mass.  EXTREMITIES: No pedal edema, cyanosis, or clubbing.  NEUROLOGIC: Cranial nerves II through XII are intact. Muscle strength 5/5 in all extremities. Sensation intact. Gait not checked.  PSYCHIATRIC: The patient is  alert and oriented x 3.  SKIN: No obvious rash, lesion, or ulcer.   DATA REVIEW:   CBC   Recent Labs Lab 07/23/16 2146  WBC 7.4  HGB 13.9  HCT 39.4*  PLT 222    Chemistries   Recent Labs Lab 07/23/16 2146  NA 135  K 4.0  CL 103  CO2 23  GLUCOSE 396*  BUN 19  CREATININE 0.95  CALCIUM 9.1    Microbiology Results   Recent Results (from the past 240 hour(s))  Blood Culture (routine x 2)     Status: None (Preliminary result)   Collection Time: 07/23/16 10:56 PM  Result Value Ref Range Status   Specimen Description BLOOD R AC  Final   Special Requests Blood Culture adequate volume  Final   Culture NO GROWTH < 12 HOURS  Final   Report Status PENDING  Incomplete  Blood Culture (routine x 2)     Status: None (Preliminary result)   Collection Time: 07/23/16 10:56 PM  Result Value Ref Range Status   Specimen Description BLOOD R FA  Final   Special Requests Blood Culture adequate volume  Final   Culture NO GROWTH < 12 HOURS  Final   Report Status PENDING  Incomplete    RADIOLOGY:  Dg Chest 2 View  Result Date: 07/23/2016 CLINICAL DATA:  Subacute onset of productive cough. Midsternal chest pain and fever. Initial encounter. EXAM: CHEST  2 VIEW COMPARISON:  Chest radiograph performed 03/12/2011 FINDINGS: The lungs are well-aerated. Minimal right midlung and left basilar opacities may reflect atelectasis or possibly mild infection. There is no evidence of pleural effusion or pneumothorax. The heart is normal in size; the mediastinal contour is within normal limits. No acute osseous abnormalities are seen. IMPRESSION: Minimal right midlung and left basilar airspace opacities may reflect atelectasis or possibly mild infection. Electronically Signed   By: Garald Balding M.D.   On: 07/23/2016 22:11     Management plans discussed with the patient, family and they are in agreement.  CODE STATUS:     Code Status Orders        Start     Ordered   07/24/16 0401  Full code   Continuous     07/24/16 0400    Code Status History    Date Active Date Inactive Code Status Order ID Comments User Context   This patient has a current code status but no historical code status.      TOTAL TIME TAKING CARE OF THIS PATIENT: *40* minutes.    Meghan Tiemann M.D on 07/24/2016 at 2:07 PM  Between 7am to 6pm - Pager - [phone] After 6pm  go to [email or web address] - password [email or web address] [facility]  Office  [phone]  CC: Primary care physician; Loistine Chance, MD

## 2016-07-24 NOTE — Plan of Care (Signed)
Problem: Education: Goal: Knowledge of [facility] Education information/materials will improve Outcome: Progressing Pt likes to be called Jesse Lawrence  Past Medical History:  Diagnosis Date  . Allergy   . Anxiety   . COPD (chronic obstructive pulmonary disease) ([place])   . Diabetes mellitus without complication ([place])   . Hyperlipidemia   . Hypertension   . Male impotence   . MI (myocardial infarction) ([place])   . Obesity    Pt is well controlled with home medications

## 2016-07-24 NOTE — Care Management (Signed)
Patient weaned to room air. I spoke with patient ad he denies RNCM need and plans to return home today. Case closed.

## 2016-07-24 NOTE — Plan of Care (Signed)
MD making rounds. Order received to discharge home. IV removed. Provided with education handouts. Prescriptions E-Scribed to pharmacy. Discharge paperwork explained, signed and witnessed. No unanswered questions. Discharged via wheelchair by nursing staff. Belongings sent with patient and family.

## 2016-07-24 NOTE — ED Notes (Signed)
Pt's O2 found to be 88% on room air, pt placed on 2LNC at this time.

## 2016-07-24 NOTE — H&P (Signed)
Jesse Lawrence. is an 56 y.o. male.   Chief Complaint: Cough HPI: The patient with past medical history of diabetes and hypertension presents the emergency department complaining of cough. The patient states that his wife had been diagnosed with bronchitis a few weeks before and he developed a similar cough. His primary care doctor told him it may be related to allergy secondary to pollen but today the patient experienced fevers and chills. He was too weak to go to church with his wife which prompted him to come to the emergency department for evaluation. Chest x-ray revealed consolidation in his right lung. He was given IV antibiotics which he reports making him feel better prior to the hospitalist service being called for admission.  Past Medical History:  Diagnosis Date  . Allergy   . Anxiety   . COPD (chronic obstructive pulmonary disease) ([place])   . Diabetes mellitus without complication ([place])   . Hyperlipidemia   . Hypertension   . Male impotence   . MI (myocardial infarction) ([place])   . Obesity     Past Surgical History:  Procedure Laterality Date  . cadiac stenting    . KNEE ARTHROSCOPY      Family History  Problem Relation Age of Onset  . Diabetes Mother   . CAD Mother   . Heart disease Mother   . Heart disease Father    Social History:  reports that he quit smoking about 3 years ago. His smoking use included Cigarettes. He has a 45.00 pack-year smoking history. He uses smokeless tobacco. He reports that he does not drink alcohol or use drugs.  Allergies: No Known Allergies  Medications Prior to Admission  Medication Sig Dispense Refill  . atorvastatin (LIPITOR) 80 MG tablet Take 1 tablet (80 mg total) by mouth daily at 6 PM. 30 tablet 5  . baclofen (LIORESAL) 10 MG tablet Take 1 tablet (10 mg total) by mouth 3 (three) times daily. 30 each 0  . benzonatate (TESSALON) 100 MG capsule Take 1-2 capsules (100-200 mg total) by mouth 3 (three) times daily as needed. 40  capsule 0  . budesonide-formoterol (SYMBICORT) 160-4.5 MCG/ACT inhaler Inhale 2 puffs into the lungs 2 (two) times daily. 1 Inhaler 0  . clonazePAM (KLONOPIN) 0.5 MG tablet Take 1 tablet (0.5 mg total) by mouth 2 (two) times daily as needed for anxiety. 20 tablet 1  . clopidogrel (PLAVIX) 75 MG tablet Take 1 tablet (75 mg total) by mouth daily. 30 tablet 5  . fluticasone (FLONASE) 50 MCG/ACT nasal spray Place 2 sprays into both nostrils as needed. 48 g 1  . glucose blood (ONE TOUCH ULTRA TEST) test strip Use as instructed 100 each 12  . lisinopril (PRINIVIL,ZESTRIL) 10 MG tablet Take 1 tablet (10 mg total) by mouth daily. 30 tablet 5  . loratadine (CLARITIN) 10 MG tablet Take 1 tablet by mouth daily.    . metoprolol succinate (TOPROL-XL) 25 MG 24 hr tablet Take 1 tablet (25 mg total) by mouth daily. 30 tablet 5  . ranitidine (ZANTAC) 300 MG tablet Take 0.5 tablets (150 mg total) by mouth 2 (two) times daily. 60 tablet 5  . sildenafil (REVATIO) 20 MG tablet TAKE 1 TO 5 TABLETS BY MOUTH AS NEEDED 50 tablet 1  . TRULICITY 1.5 ZH/2.9JM SOPN INJECT 1.5 MG INTO THE SKIN ONCE WEEKLY 4 pen 2  . VASCEPA 1 g CAPS TAKE 2 CAPSULES BY MOUTH TWICE DAILY 120 capsule 5  . XIGDUO XR 08-998 MG TB24  Take 1 tablet by mouth 2 (two) times daily. 60 tablet 5    Results for orders placed or performed during the hospital encounter of 07/23/16 (from the past 48 hour(s))  Basic metabolic panel     Status: Abnormal   Collection Time: 07/23/16  9:46 PM  Result Value Ref Range   Sodium 135 135 - 145 mmol/L   Potassium 4.0 3.5 - 5.1 mmol/L   Chloride 103 101 - 111 mmol/L   CO2 23 22 - 32 mmol/L   Glucose, Bld 396 (H) 65 - 99 mg/dL   BUN 19 6 - 20 mg/dL   Creatinine, Ser 0.95 0.61 - 1.24 mg/dL   Calcium 9.1 8.9 - 10.3 mg/dL   GFR calc non Af Amer >60 >60 mL/min   GFR calc Af Amer >60 >60 mL/min    Comment: (NOTE) The eGFR has been calculated using the CKD EPI equation. This calculation has not been validated in all  clinical situations. eGFR's persistently <60 mL/min signify possible Chronic Kidney Disease.    Anion gap 9 5 - 15  CBC     Status: Abnormal   Collection Time: 07/23/16  9:46 PM  Result Value Ref Range   WBC 7.4 3.8 - 10.6 K/uL   RBC 4.32 (L) 4.40 - 5.90 MIL/uL   Hemoglobin 13.9 13.0 - 18.0 g/dL   HCT 39.4 (L) 40.0 - 52.0 %   MCV 91.2 80.0 - 100.0 fL   MCH 32.2 26.0 - 34.0 pg   MCHC 35.3 32.0 - 36.0 g/dL   RDW 12.4 11.5 - 14.5 %   Platelets 222 150 - 440 K/uL  Troponin I     Status: None   Collection Time: 07/23/16  9:46 PM  Result Value Ref Range   Troponin I <0.03 <0.03 ng/mL  Lactic acid, plasma     Status: Abnormal   Collection Time: 07/23/16 10:56 PM  Result Value Ref Range   Lactic Acid, Venous 2.0 (HH) 0.5 - 1.9 mmol/L    Comment: CRITICAL RESULT CALLED TO, READ BACK BY AND VERIFIED WITH IRIS GUIDRY ON 07/23/16 AT 2348 BY TLB   Influenza panel by PCR (type A & B)     Status: None   Collection Time: 07/23/16 10:56 PM  Result Value Ref Range   Influenza A By PCR NEGATIVE NEGATIVE   Influenza B By PCR NEGATIVE NEGATIVE    Comment: (NOTE) The Xpert Xpress Flu assay is intended as an aid in the diagnosis of  influenza and should not be used as a sole basis for treatment.  This  assay is FDA approved for nasopharyngeal swab specimens only. Nasal  washings and aspirates are unacceptable for Xpert Xpress Flu testing.   Lactic acid, plasma     Status: None   Collection Time: 07/24/16  1:32 AM  Result Value Ref Range   Lactic Acid, Venous 1.8 0.5 - 1.9 mmol/L   Dg Chest 2 View  Result Date: 07/23/2016 CLINICAL DATA:  Subacute onset of productive cough. Midsternal chest pain and fever. Initial encounter. EXAM: CHEST  2 VIEW COMPARISON:  Chest radiograph performed 03/12/2011 FINDINGS: The lungs are well-aerated. Minimal right midlung and left basilar opacities may reflect atelectasis or possibly mild infection. There is no evidence of pleural effusion or pneumothorax. The  heart is normal in size; the mediastinal contour is within normal limits. No acute osseous abnormalities are seen. IMPRESSION: Minimal right midlung and left basilar airspace opacities may reflect atelectasis or possibly mild infection. Electronically Signed  By: Garald Balding M.D.   On: 07/23/2016 22:11    Review of Systems  Constitutional: Positive for chills and fever.  HENT: Negative for sore throat and tinnitus.   Eyes: Negative for blurred vision and redness.  Respiratory: Positive for cough. Negative for shortness of breath.   Cardiovascular: Negative for chest pain, palpitations, orthopnea and PND.  Gastrointestinal: Negative for abdominal pain, diarrhea, nausea and vomiting.  Genitourinary: Negative for dysuria, frequency and urgency.  Musculoskeletal: Negative for joint pain and myalgias.  Skin: Negative for rash.       No lesions  Neurological: Positive for weakness. Negative for speech change and focal weakness.  Endo/Heme/Allergies: Does not bruise/bleed easily.       No temperature intolerance  Psychiatric/Behavioral: Negative for depression and suicidal ideas.    Blood pressure 120/66, pulse 92, temperature 98.3 F (36.8 C), temperature source Oral, resp. rate 18, height 5' 8"  (1.727 m), weight 104.3 kg (230 lb), SpO2 93 %. Physical Exam  Constitutional: He is oriented to person, place, and time. He appears well-developed and well-nourished. No distress.  HENT:  Head: Normocephalic and atraumatic.  Mouth/Throat: Oropharynx is clear and moist.  Eyes: Conjunctivae and EOM are normal. Pupils are equal, round, and reactive to light. No scleral icterus.  Neck: Normal range of motion. Neck supple. No JVD present. No tracheal deviation present. No thyromegaly present.  Cardiovascular: Normal rate, regular rhythm and normal heart sounds.  Exam reveals no gallop and no friction rub.   No murmur heard. Respiratory: Tachypnea noted. He has decreased breath sounds in the right  lower field. He has wheezes in the right middle field.  GI: Soft. Bowel sounds are normal. He exhibits no distension. There is no tenderness.  Genitourinary:  Genitourinary Comments: Deferred  Musculoskeletal: Normal range of motion. He exhibits no edema.  Lymphadenopathy:    He has no cervical adenopathy.  Neurological: He is alert and oriented to person, place, and time. No cranial nerve deficit.  Skin: Skin is warm and dry. No rash noted. No erythema.  Psychiatric: He has a normal mood and affect. His behavior is normal. Judgment and thought content normal.     Assessment/Plan This is a 56 year old male admitted for sepsis secondary to pneumonia. 1. Sepsis: The patient meets criteria via fever, tachycardia and tachypnea. He is hemodynamically stable. Hydrate with intravenous fluid. Follow blood cultures for growth and sensitivities 2. Pneumonia: Community-acquired; continue ceftriaxone and azithromycin. 3. Diabetes mellitus type 2: Hold oral hypoglycemic agents. Sliding scale insulin while hospitalized 4. Essential hypertension: Controlled; continue Toprol and lisinopril 5. Coronary artery disease: The patient has had an MI in the past. Continue Plavix 6. COPD: Continue inhaled corticosteroid. Albuterol as needed. 7. Dyslipidemia: Continue fiber therapy as well as statin therapy 8. DVT prophylaxis: Lovenox 9. GI prophylaxis: Pepcid The patient is a full code. Time spent on admission was inpatient care approximately 45 minutes  Harrie Foreman, MD 07/24/2016, 4:04 AM

## 2016-07-25 LAB — HEMOGLOBIN A1C
Hgb A1c MFr Bld: 8.3 % — ABNORMAL HIGH (ref 4.8–5.6)
Mean Plasma Glucose: 192 mg/dL

## 2016-07-25 LAB — HIV ANTIBODY (ROUTINE TESTING W REFLEX): HIV Screen 4th Generation wRfx: NONREACTIVE

## 2016-07-28 LAB — CULTURE, BLOOD (ROUTINE X 2)
CULTURE: NO GROWTH
CULTURE: NO GROWTH
SPECIAL REQUESTS: ADEQUATE
Special Requests: ADEQUATE

## 2016-07-30 ENCOUNTER — Encounter: Payer: Self-pay | Admitting: Family Medicine

## 2016-07-30 ENCOUNTER — Ambulatory Visit (INDEPENDENT_AMBULATORY_CARE_PROVIDER_SITE_OTHER): Payer: Managed Care, Other (non HMO) | Admitting: Family Medicine

## 2016-07-30 VITALS — BP 108/76 | HR 85 | Temp 97.7°F | Resp 16 | Ht 68.0 in | Wt 221.0 lb

## 2016-07-30 DIAGNOSIS — J189 Pneumonia, unspecified organism: Secondary | ICD-10-CM

## 2016-07-30 DIAGNOSIS — Z09 Encounter for follow-up examination after completed treatment for conditions other than malignant neoplasm: Secondary | ICD-10-CM | POA: Diagnosis not present

## 2016-07-30 NOTE — Progress Notes (Signed)
Name: Jesse Lawrence.   MRN: [id number]    DOB: 1960/11/29   Date:07/30/2016       Progress Note  Subjective  Chief Complaint  Chief Complaint  Patient presents with  . Hospitalization Follow-up  . Pneumonia    HPI  CAP: he was seen at our office on 07/15/2016 with symptoms of URI, 6 days later he developed fever and pleuritic chest pain, fatigue, wheezing and SOB, he went to [facility] and was admitted to rule sepsis and with diagnoses of bilateral community acquired pneumonia. He was discharged on Azithromycin and Cefdinir. He completely both medications. He is back to work. He still feels tired, mild wheezing, but no longer has SOB or pleuritic chest pain. He still has a cough but not as severe.    Patient Active Problem List   Diagnosis Date Noted  . Sepsis ([place]) 07/24/2016  . OSA (obstructive sleep apnea) 05/21/2015  . Perennial allergic rhinitis 11/07/2014  . Arteriosclerosis of coronary artery 11/07/2014  . CD (contact dermatitis) 11/07/2014  . Decreased libido 11/07/2014  . Diabetes mellitus with renal manifestation ([place]) 11/07/2014  . Gastro-esophageal reflux disease without esophagitis 11/07/2014  . H/O acute myocardial infarction 11/07/2014  . Hypercholesteremia 11/07/2014  . Benign hypertension 11/07/2014  . Hypertriglyceridemia 11/07/2014  . H/O high risk medication treatment 11/07/2014  . NASH (nonalcoholic steatohepatitis) 11/07/2014  . Microalbuminuria 11/07/2014  . Adult BMI 30+ 11/07/2014  . Fungal infection of toenail 11/07/2014  . Tobacco abuse 11/07/2014  . ED (erectile dysfunction) of organic origin 09/29/2006    Past Surgical History:  Procedure Laterality Date  . cadiac stenting    . KNEE ARTHROSCOPY      Family History  Problem Relation Age of Onset  . Diabetes Mother   . CAD Mother   . Heart disease Mother   . Heart disease Father     Social History   Social History  . Marital status: Married    Spouse name: N/A  . Number of children: N/A   . Years of education: N/A   Occupational History  . Not on file.   Social History Main Topics  . Smoking status: Former Smoker    Packs/day: 1.50    Years: 30.00    Types: Cigarettes    Quit date: 04/19/2013  . Smokeless tobacco: Current User  . Alcohol use No  . Drug use: No  . Sexual activity: Yes   Other Topics Concern  . Not on file   Social History Narrative  . No narrative on file     Current Outpatient Prescriptions:  .  atorvastatin (LIPITOR) 80 MG tablet, Take 1 tablet (80 mg total) by mouth daily at 6 PM., Disp: 30 tablet, Rfl: 5 .  baclofen (LIORESAL) 10 MG tablet, Take 1 tablet (10 mg total) by mouth 3 (three) times daily., Disp: 30 each, Rfl: 0 .  benzonatate (TESSALON) 100 MG capsule, Take 1-2 capsules (100-200 mg total) by mouth 3 (three) times daily as needed., Disp: 40 capsule, Rfl: 0 .  budesonide-formoterol (SYMBICORT) 160-4.5 MCG/ACT inhaler, Inhale 2 puffs into the lungs 2 (two) times daily., Disp: 1 Inhaler, Rfl: 0 .  cefUROXime (CEFTIN) 500 MG tablet, Take 1 tablet (500 mg total) by mouth 2 (two) times daily with a meal., Disp: 12 tablet, Rfl: 0 .  clonazePAM (KLONOPIN) 0.5 MG tablet, Take 1 tablet (0.5 mg total) by mouth 2 (two) times daily as needed for anxiety., Disp: 20 tablet, Rfl: 1 .  clopidogrel (PLAVIX) 75  MG tablet, Take 1 tablet (75 mg total) by mouth daily., Disp: 30 tablet, Rfl: 5 .  fluticasone (FLONASE) 50 MCG/ACT nasal spray, Place 2 sprays into both nostrils as needed., Disp: 48 g, Rfl: 1 .  glucose blood (ONE TOUCH ULTRA TEST) test strip, Use as instructed, Disp: 100 each, Rfl: 12 .  lisinopril (PRINIVIL,ZESTRIL) 10 MG tablet, Take 1 tablet (10 mg total) by mouth daily., Disp: 30 tablet, Rfl: 5 .  loratadine (CLARITIN) 10 MG tablet, Take 1 tablet by mouth daily., Disp: , Rfl:  .  metoprolol succinate (TOPROL-XL) 25 MG 24 hr tablet, Take 1 tablet (25 mg total) by mouth daily., Disp: 30 tablet, Rfl: 5 .  ranitidine (ZANTAC) 300 MG tablet,  Take 0.5 tablets (150 mg total) by mouth 2 (two) times daily., Disp: 60 tablet, Rfl: 5 .  sildenafil (REVATIO) 20 MG tablet, TAKE 1 TO 5 TABLETS BY MOUTH AS NEEDED, Disp: 50 tablet, Rfl: 1 .  TRULICITY 1.5 GU/4.4IH SOPN, INJECT 1.5 MG INTO THE SKIN ONCE WEEKLY, Disp: 4 pen, Rfl: 2 .  VASCEPA 1 g CAPS, TAKE 2 CAPSULES BY MOUTH TWICE DAILY, Disp: 120 capsule, Rfl: 5 .  XIGDUO XR 08-998 MG TB24, Take 1 tablet by mouth 2 (two) times daily., Disp: 60 tablet, Rfl: 5  No Known Allergies   ROS  Constitutional: Negative for fever, positive for  weight change.  Respiratory:Positive for cough but no  shortness of breath.   Cardiovascular: Negative for chest pain or palpitations.  Gastrointestinal: Negative for abdominal pain, no bowel changes.  Musculoskeletal: Negative for gait problem or joint swelling.  Skin: Negative for rash.  Neurological: Negative for dizziness or headache.  No other specific complaints in a complete review of systems (except as listed in HPI above).  Objective  Vitals:   07/30/16 1258  BP: 108/76  Pulse: 85  Resp: 16  Temp: 97.7 F (36.5 C)  TempSrc: Oral  SpO2: 96%  Weight: 221 lb (100.2 kg)  Height: _0  (1.727 m)    Body mass index is 33.6 kg/m.  Physical Exam  Constitutional: Patient appears well-developed and well-nourished. Obese No distress.  HEENT: head atraumatic, normocephalic, pupils equal and reactive to light, ears normal TM bilaterally, neck supple, throat within normal limits Cardiovascular: Normal rate, regular rhythm and normal heart sounds.  No murmur heard. No BLE edema. Pulmonary/Chest: Effort normal effort, but has scattered wheezing.  No respiratory distress. Abdominal: Soft.  There is no tenderness. Psychiatric: Patient has a normal mood and affect. behavior is normal. Judgment and thought content normal.  Recent Results (from the past 2160 hour(s))  POCT HgB A1C     Status: Abnormal   Collection Time: 05/26/16  9:22 AM  Result  Value Ref Range   Hemoglobin A1C 7.8   Lipid panel     Status: Abnormal   Collection Time: 05/26/16 10:26 AM  Result Value Ref Range   Cholesterol 169 <200 mg/dL   Triglycerides 537 (H) <150 mg/dL   HDL 26 (L) >40 mg/dL   Total CHOL/HDL Ratio 6.5 (H) <5.0 Ratio   VLDL NOT CALC <30 mg/dL    Comment:   Not calculated due to Triglyceride >400. Suggest ordering Direct LDL (Unit Code: [phone]).    LDL Cholesterol NOT CALC <100 mg/dL    Comment:   Not calculated due to Triglyceride >400. Suggest ordering Direct LDL (Unit Code: [phone]).   COMPLETE METABOLIC PANEL WITH GFR     Status: Abnormal   Collection Time: 05/26/16 10:26  AM  Result Value Ref Range   Sodium 139 135 - 146 mmol/L   Potassium 4.7 3.5 - 5.3 mmol/L   Chloride 103 98 - 110 mmol/L   CO2 27 20 - 31 mmol/L   Glucose, Bld 124 (H) 65 - 99 mg/dL   BUN 11 7 - 25 mg/dL   Creat 0.72 0.70 - 1.33 mg/dL    Comment:   For patients > or = 54 years of age: The upper reference limit for Creatinine is approximately 13% higher for people identified as African-American.      Total Bilirubin 1.1 0.2 - 1.2 mg/dL   Alkaline Phosphatase 66 40 - 115 U/L   AST 20 10 - 35 U/L   ALT 37 9 - 46 U/L   Total Protein 7.3 6.1 - 8.1 g/dL   Albumin 4.7 3.6 - 5.1 g/dL   Calcium 9.7 8.6 - 10.3 mg/dL   GFR, Est African American >89 >=60 mL/min   GFR, Est Non African American >89 >=60 mL/min  Vitamin B12     Status: None   Collection Time: 05/26/16 10:26 AM  Result Value Ref Range   Vitamin B-12 996 200 - 1,100 pg/mL  Basic metabolic panel     Status: Abnormal   Collection Time: 07/23/16  9:46 PM  Result Value Ref Range   Sodium 135 135 - 145 mmol/L   Potassium 4.0 3.5 - 5.1 mmol/L   Chloride 103 101 - 111 mmol/L   CO2 23 22 - 32 mmol/L   Glucose, Bld 396 (H) 65 - 99 mg/dL   BUN 19 6 - 20 mg/dL   Creatinine, Ser 0.95 0.61 - 1.24 mg/dL   Calcium 9.1 8.9 - 10.3 mg/dL   GFR calc non Af Amer >60 >60 mL/min   GFR calc Af Amer >60 >60 mL/min     Comment: (NOTE) The eGFR has been calculated using the CKD EPI equation. This calculation has not been validated in all clinical situations. eGFR's persistently <60 mL/min signify possible Chronic Kidney Disease.    Anion gap 9 5 - 15  CBC     Status: Abnormal   Collection Time: 07/23/16  9:46 PM  Result Value Ref Range   WBC 7.4 3.8 - 10.6 K/uL   RBC 4.32 (L) 4.40 - 5.90 MIL/uL   Hemoglobin 13.9 13.0 - 18.0 g/dL   HCT 39.4 (L) 40.0 - 52.0 %   MCV 91.2 80.0 - 100.0 fL   MCH 32.2 26.0 - 34.0 pg   MCHC 35.3 32.0 - 36.0 g/dL   RDW 12.4 11.5 - 14.5 %   Platelets 222 150 - 440 K/uL  Troponin I     Status: None   Collection Time: 07/23/16  9:46 PM  Result Value Ref Range   Troponin I <0.03 <0.03 ng/mL  Lactic acid, plasma     Status: Abnormal   Collection Time: 07/23/16 10:56 PM  Result Value Ref Range   Lactic Acid, Venous 2.0 (HH) 0.5 - 1.9 mmol/L    Comment: CRITICAL RESULT CALLED TO, READ BACK BY AND VERIFIED WITH IRIS GUIDRY ON 07/23/16 AT 2348 BY TLB   Blood Culture (routine x 2)     Status: None   Collection Time: 07/23/16 10:56 PM  Result Value Ref Range   Specimen Description BLOOD R AC    Special Requests Blood Culture adequate volume    Culture NO GROWTH 5 DAYS    Report Status 07/28/2016 FINAL   Blood Culture (routine x 2)  Status: None   Collection Time: 07/23/16 10:56 PM  Result Value Ref Range   Specimen Description BLOOD R FA    Special Requests Blood Culture adequate volume    Culture NO GROWTH 5 DAYS    Report Status 07/28/2016 FINAL   Influenza panel by PCR (type A & B)     Status: None   Collection Time: 07/23/16 10:56 PM  Result Value Ref Range   Influenza A By PCR NEGATIVE NEGATIVE   Influenza B By PCR NEGATIVE NEGATIVE    Comment: (NOTE) The Xpert Xpress Flu assay is intended as an aid in the diagnosis of  influenza and should not be used as a sole basis for treatment.  This  assay is FDA approved for nasopharyngeal swab specimens only. Nasal   washings and aspirates are unacceptable for Xpert Xpress Flu testing.   Lactic acid, plasma     Status: None   Collection Time: 07/24/16  1:32 AM  Result Value Ref Range   Lactic Acid, Venous 1.8 0.5 - 1.9 mmol/L  Glucose, capillary     Status: Abnormal   Collection Time: 07/24/16  4:05 AM  Result Value Ref Range   Glucose-Capillary 127 (H) 65 - 99 mg/dL  HIV antibody (Routine Testing)     Status: None   Collection Time: 07/24/16  4:57 AM  Result Value Ref Range   HIV Screen 4th Generation wRfx Non Reactive Non Reactive    Comment: (NOTE) Performed At: [facility] [address] [place], [place] [id number] Lindon Romp MD [id number]   TSH     Status: None   Collection Time: 07/24/16  4:57 AM  Result Value Ref Range   TSH 1.655 0.350 - 4.500 uIU/mL    Comment: Performed by a 3rd Generation assay with a functional sensitivity of <=0.01 uIU/mL.  Hemoglobin A1c     Status: Abnormal   Collection Time: 07/24/16  4:57 AM  Result Value Ref Range   Hgb A1c MFr Bld 8.3 (H) 4.8 - 5.6 %    Comment: (NOTE)         Pre-diabetes: 5.7 - 6.4         Diabetes: >6.4         Glycemic control for adults with diabetes: <7.0    Mean Plasma Glucose 192 mg/dL    Comment: (NOTE) Performed At: [facility] [address] [place], [place] [id number] Lindon Romp MD [id number]   Glucose, capillary     Status: Abnormal   Collection Time: 07/24/16  7:39 AM  Result Value Ref Range   Glucose-Capillary 155 (H) 65 - 99 mg/dL  Glucose, capillary     Status: Abnormal   Collection Time: 07/24/16 11:39 AM  Result Value Ref Range   Glucose-Capillary 121 (H) 65 - 99 mg/dL     PHQ2/9: Depression screen [facility] 2/9 07/30/2016 05/26/2016 11/16/2015 08/17/2015 07/04/2015  Decreased Interest 0 0 0 0 0  Down, Depressed, Hopeless 0 0 0 0 0  PHQ - 2 Score 0 0 0 0 0    Fall Risk: Fall Risk  07/30/2016 05/26/2016 11/16/2015 08/17/2015 07/04/2015  Falls in the past year? _0        Assessment & Plan  1. Hospital discharge follow-up  From 07/23/2016 -07/24/2016  2. Community acquired pneumonia, unspecified laterality  He is still wheezing, and has mild cough, he does not want to take an inhaler, he states he is feeling better, we will repeat CXR when he  returns for DM follow up.

## 2016-08-01 ENCOUNTER — Ambulatory Visit: Payer: Self-pay | Admitting: Cardiovascular Disease

## 2016-08-04 ENCOUNTER — Encounter: Payer: Self-pay | Admitting: *Deleted

## 2016-08-04 ENCOUNTER — Other Ambulatory Visit: Payer: Self-pay | Admitting: *Deleted

## 2016-08-04 NOTE — Patient Outreach (Addendum)
[facility]) [facility] Management  08/04/2016  Jesse Lawrence. 01-Jan-1961 [id number]   Subjective: Telephone call to patient's home number, spoke with patient, and HIPAA verified.  Discussed [facility] [employer] Transition of Lawrence follow up, patient voiced understanding, and is in agreement to follow up.   Patient states he is doing well, currently driving, and has returned to work.  States he had hospital follow up appointment with primary MD on 07/30/16 and will go back in for breathing test in a couple of weeks.  Patient states he does not have any transition of Lawrence, Lawrence coordination, disease management, disease monitoring, transportation, community resource, or pharmacy needs at this time.  States he is very appreciative of the follow up and is in agreement to receive [place] [facility] information.    Objective: Per chart, [employer], and chart review, patient hospitalized  07/23/16 -07/24/16 for sepsis, and pneumonia.  Patient  had ED visit on 04/30/16 for viral upper respiratory infection.  Patient also has a history of COPD, hypertension, diabetes, and hyperlipidemia.      Assessment: Received [employer] Transition of Lawrence referral on 08/04/16.  Transition of Lawrence follow up completed, no Lawrence management needs, and will proceed with case closure.    Plan: RNCM will send patient successful outreach letter, [facility] pamphlet, and magnet. RNCM will send case closure due to follow up completed / no Lawrence management needs request to Jesse Lawrence at [facility].  Jesse Lawrence H. Annia Friendly, BSN, [place] [facility] Telephonic CM Phone: [phone] Fax: [phone]

## 2016-08-09 ENCOUNTER — Other Ambulatory Visit: Payer: Self-pay | Admitting: Family Medicine
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
## 2016-08-11 ENCOUNTER — Other Ambulatory Visit: Payer: Self-pay | Admitting: Family Medicine

## 2016-08-11 DIAGNOSIS — R05 Cough: Secondary | ICD-10-CM

## 2016-08-11 DIAGNOSIS — J069 Acute upper respiratory infection, unspecified: Secondary | ICD-10-CM

## 2016-08-11 DIAGNOSIS — R059 Cough, unspecified: Secondary | ICD-10-CM

## 2016-08-14 NOTE — Progress Notes (Addendum)
Cardiology Office Note  Date:  08/15/2016   ID:  Jesse Cooter., DOB 1960-11-04, MRN [id number]  PCP:  Steele Sizer, MD   Chief Complaint  Patient presents with  . other    NP. referred by Dr.Sowels for intermittent cp. Pt c/o sensitivity to noises at times; denies cp/sob. Reviewed meds with pt verbally.    HPI:  Jesse Lawrence is a pleasant 57 year old gentleman with history of CAD, stent x 2   2002 and 2003,  ISR in 2007, TPA (cath 3 days later) COPD, long smoking history, stopped 3 years ago HTN DM II, HBA1C 8.3, previously greater than 9 Who presents by referral from Dr. Ancil Boozer for consultation of his coronary disease, recent chest pain symptoms  Long discussion concerning his previous history Previously had shortness of breath and anginal symptoms before each of his stents  He denies having any similar symptoms in the past 11 years Active, no regular exercise program  He stopped smoking 3 years ago   Recent pneumonia with evaluation in the emergency room for chest pain, wheezing, shortness of breath, cough. 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 These T wave abnormalities have resolved on today's visit   PMH:   has a past medical history of Allergy; Anxiety; COPD (chronic obstructive pulmonary disease) ([place]); Diabetes mellitus without complication ([place]); Hyperlipidemia; Hypertension; Male impotence; MI (myocardial infarction) ([place]); Obesity; and PNA (pneumonia).  PSH:     Past Surgical History:  Procedure Laterality Date  . cadiac stenting    . CYSTECTOMY     56 years of age  . KNEE ARTHROSCOPY      Current Outpatient Prescriptions  Medication Sig Dispense Refill  . atorvastatin (LIPITOR) 80 MG tablet Take 1 tablet (80 mg total) by mouth daily at 6 PM. 30 tablet 5  . baclofen (LIORESAL) 10 MG tablet Take 1 tablet (10 mg total) by mouth 3 (three) times daily. 30 each 0  . benzonatate (TESSALON) 100 MG capsule Take 1-2 capsules (100-200 mg total) by mouth 3 (three) times daily as needed. 40 capsule 0  . cefUROXime (CEFTIN) 500 MG tablet Take 1 tablet (500 mg total) by mouth 2 (two) times daily with a meal. 12 tablet 0  . clonazePAM (KLONOPIN) 0.5 MG tablet Take 1 tablet (0.5 mg total) by mouth 2 (two) times daily as needed for anxiety. 20 tablet 1  . clopidogrel (PLAVIX) 75 MG tablet Take 1 tablet (75 mg total) by mouth daily. 30 tablet 5  . fluticasone (FLONASE) 50 MCG/ACT nasal spray Place 2 sprays into both nostrils as needed. 48 g 1  . glucose blood (ONE TOUCH ULTRA TEST) test strip Use as instructed 100 each 12  . lisinopril (PRINIVIL,ZESTRIL) 10 MG tablet Take 1 tablet (10 mg total) by mouth daily. 30 tablet 5  . loratadine (CLARITIN) 10 MG tablet Take 1 tablet by mouth daily.    . metoprolol succinate (TOPROL-XL) 25 MG 24 hr tablet Take 1 tablet (25 mg total) by mouth daily. 30 tablet 5  . ranitidine (ZANTAC) 300  MG tablet Take 0.5 tablets (150 mg total) by mouth 2 (two) times daily. 60 tablet 5  . sildenafil (REVATIO) 20 MG tablet TAKE 1 TO 5 TABLETS BY MOUTH AS NEEDED 50 tablet 1  . SYMBICORT 160-4.5 MCG/ACT inhaler INHALE 2 PUFFS INTO THE LUNGS 2 (TWO) TIMES DAILY. 10.2 Inhaler 0  . TRULICITY 1.5 QQ/7.6PP SOPN INJECT 1.5 MG INTO THE SKIN ONCE WEEKLY 4 pen 0  . VASCEPA 1 g CAPS TAKE 2 CAPSULES BY MOUTH TWICE DAILY 120 capsule 5  . XIGDUO XR 08-998 MG TB24 Take 1 tablet by mouth 2 (two) times daily. 60 tablet 5  . ezetimibe (ZETIA) 10 MG tablet Take  1 tablet (10 mg total) by mouth daily. 30 tablet 11   No current facility-administered medications for this visit.      Allergies:   Patient has no known allergies.   Social History:  The patient  reports that he quit smoking about 3 years ago. His smoking use included Cigarettes. He has a 45.00 pack-year smoking history. He uses smokeless tobacco. He reports that he drinks alcohol. He reports that he does not use drugs.   Family History:   family history includes CAD in his mother; Diabetes in his mother; Heart attack in his father and mother; Heart disease in his father and mother.    Review of Systems: Review of Systems  Constitutional: Negative.   Respiratory: Negative.   Cardiovascular: Negative.   Gastrointestinal: Negative.   Musculoskeletal: Negative.   Neurological: Negative.   Psychiatric/Behavioral: Negative.   All other systems reviewed and are negative.    PHYSICAL EXAM: VS:  BP 110/66 (BP Location: Right Arm, Patient Position: Sitting, Cuff Size: Normal)   Pulse (!) 102   Ht 5' 8"  (1.727 m)   Wt 220 lb 12 oz (100.1 kg)   BMI 33.56 kg/m  , BMI Body mass index is 33.56 kg/m. GEN: Well nourished, well developed, in no acute distress ,  obese  HEENT: normal  Neck: no JVD, carotid bruits, or masses Cardiac: RRR; no murmurs, rubs, or gallops,no edema  Respiratory:  clear to auscultation bilaterally, normal work of breathing GI: soft, nontender, nondistended, + BS MS: no deformity or atrophy  Skin: warm and dry, no rash Neuro:  Strength and sensation are intact Psych: euthymic mood, full affect    Recent Labs: 05/26/2016: ALT 37 07/23/2016: BUN 19; Creatinine, Ser 0.95; Hemoglobin 13.9; Platelets 222; Potassium 4.0; Sodium 135 07/24/2016: TSH 1.655    Lipid Panel Lab Results  Component Value Date   CHOL 169 05/26/2016   HDL 26 (L) 05/26/2016   LDLCALC NOT CALC 05/26/2016   TRIG 537 (H) 05/26/2016      Wt Readings from Last 3 Encounters:  08/15/16  220 lb 12 oz (100.1 kg)  07/30/16 221 lb (100.2 kg)  07/23/16 230 lb (104.3 kg)       ASSESSMENT AND PLAN:  Chest pain/ Coronary artery disease of native artery of native heart with stable angina pectoris ([place]) - Plan: EKG 12-Lead Long discussion with him concerning his previous disease and coronary history. Currently with no anginal symptoms Previous chest pain in the setting of pneumonia, atypical in nature He did have nonspecific T-wave abnormality V4 through V6 on EKG in the hospital, EKG today shows no change compared to 2014. Recommended he call our office for any recurrent symptoms concerning for angina, stress test could be performed  History of coronary artery stent placement - Plan: EKG 12-Lead 2 stents placed with  history of in-stent restenosis Maintained on Plavix Felt he was bleeding/Bruising with both aspirin and Plavix  Hypercholesteremia Recommended he stay on his Lipitor 80 mg daily, had Zetia 10 mg daily Goal LDL less than 70  Benign hypertension Blood pressure is well controlled on today's visit. No changes made to the medications.  Hypertriglyceridemia Long discussion concerning his diet. Dietary guide provided  OSA (obstructive sleep apnea) We have encouraged continued exercise, careful diet management in an effort to lose weight.  Tobacco abuse Quit smoking 3 years ago Underlying COPD  Sinus tachycardia Rate improved on recheck Unable to advance his beta blocker given borderline blood pressure  Patient seen in consultation for Dr. Ancil Boozer. He'll be referred back for ongoing care of the issues detailed above  Disposition:   F/U one year   Total encounter time more than 60 minutes  Greater than 50% was spent in counseling and coordination of care with the patient    Orders Placed This Encounter  Procedures  . EKG 12-Lead     Signed, Esmond Plants, M.D., Ph.D. 08/15/2016  [place], [place]

## 2016-08-15 ENCOUNTER — Ambulatory Visit (INDEPENDENT_AMBULATORY_CARE_PROVIDER_SITE_OTHER): Payer: Managed Care, Other (non HMO) | Admitting: Cardiovascular Disease

## 2016-08-15 ENCOUNTER — Encounter: Payer: Self-pay | Admitting: Cardiovascular Disease

## 2016-08-15 VITALS — BP 110/66 | HR 102 | Ht 68.0 in | Wt 220.8 lb

## 2016-08-15 DIAGNOSIS — Z955 Presence of coronary angioplasty implant and graft: Secondary | ICD-10-CM | POA: Diagnosis not present

## 2016-08-15 DIAGNOSIS — I10 Essential (primary) hypertension: Secondary | ICD-10-CM | POA: Diagnosis not present

## 2016-08-15 DIAGNOSIS — G4733 Obstructive sleep apnea (adult) (pediatric): Secondary | ICD-10-CM

## 2016-08-15 DIAGNOSIS — Z720 Tobacco use: Secondary | ICD-10-CM

## 2016-08-15 DIAGNOSIS — E781 Pure hyperglyceridemia: Secondary | ICD-10-CM | POA: Diagnosis not present

## 2016-08-15 DIAGNOSIS — E7800 Pure hypercholesterolemia, unspecified: Secondary | ICD-10-CM | POA: Diagnosis not present

## 2016-08-15 DIAGNOSIS — R000 Tachycardia, unspecified: Secondary | ICD-10-CM | POA: Diagnosis not present

## 2016-08-15 DIAGNOSIS — I25118 Atherosclerotic heart disease of native coronary artery with other forms of angina pectoris: Secondary | ICD-10-CM | POA: Diagnosis not present

## 2016-08-15 MED ORDER — EZETIMIBE 10 MG PO TABS: 10.0000 mg | ORAL_TABLET | Freq: Every day | ORAL | 3 refills | Status: DC

## 2016-08-15 MED ORDER — EZETIMIBE 10 MG PO TABS: 10.0000 mg | ORAL_TABLET | Freq: Every day | ORAL | 11 refills | Status: DC

## 2016-08-15 NOTE — Patient Instructions (Signed)
Medication Instructions:   Please start zetia one a day  Labwork:  No new labs needed  Testing/Procedures:  No further testing at this time   I recommend watching educational videos on topics of interest to you at:       [email or web address]  Enter code: [email or web address]    Follow-Up: It was a pleasure seeing you in the office today. Please call us if you have new issues that need to be addressed before your next appt.  [phone]  Your physician wants you to follow-up in: 6 months.  You will receive a reminder letter in the mail two months in advance. If you don't receive a letter, please call our office to schedule the follow-up appointment.  If you need a refill on your cardiac medications before your next appointment, please call your pharmacy.

## 2016-08-17 ENCOUNTER — Other Ambulatory Visit: Payer: Self-pay | Admitting: Family Medicine

## 2016-08-17 DIAGNOSIS — J069 Acute upper respiratory infection, unspecified: Secondary | ICD-10-CM

## 2016-08-17 DIAGNOSIS — R059 Cough, unspecified: Secondary | ICD-10-CM

## 2016-08-17 DIAGNOSIS — R05 Cough: Secondary | ICD-10-CM

## 2016-08-25 ENCOUNTER — Ambulatory Visit: Payer: Managed Care, Other (non HMO) | Admitting: Family Medicine

## 2016-09-05 ENCOUNTER — Ambulatory Visit (INDEPENDENT_AMBULATORY_CARE_PROVIDER_SITE_OTHER): Payer: Managed Care, Other (non HMO) | Admitting: Family Medicine

## 2016-09-05 ENCOUNTER — Encounter: Payer: Self-pay | Admitting: Family Medicine

## 2016-09-05 VITALS — BP 124/86 | HR 97 | Temp 98.1°F | Resp 18 | Ht 68.0 in | Wt 221.2 lb

## 2016-09-05 DIAGNOSIS — M62830 Muscle spasm of back: Secondary | ICD-10-CM

## 2016-09-05 MED ORDER — METAXALONE 800 MG PO TABS: 800.0000 mg | ORAL_TABLET | Freq: Three times a day (TID) | ORAL | 0 refills | Status: DC

## 2016-09-05 MED ORDER — MELOXICAM 15 MG PO TABS: 15.0000 mg | ORAL_TABLET | Freq: Every day | ORAL | 0 refills | Status: DC

## 2016-09-05 NOTE — Patient Instructions (Addendum)
Back Exercises If you have pain in your back, do these exercises 2-3 times each day or as told by your doctor. When the pain goes away, do the exercises once each day, but repeat the steps more times for each exercise (do more repetitions). If you do not have pain in your back, do these exercises once each day or as told by your doctor. Exercises Single Knee to Chest  Do these steps 3-5 times in a row for each leg: 1. Lie on your back on a firm bed or the floor with your legs stretched out. 2. Bring one knee to your chest. 3. Hold your knee to your chest by grabbing your knee or thigh. 4. Pull on your knee until you feel a gentle stretch in your lower back. 5. Keep doing the stretch for 10-30 seconds. 6. Slowly let go of your leg and straighten it.  Pelvic Tilt  Do these steps 5-10 times in a row: 1. Lie on your back on a firm bed or the floor with your legs stretched out. 2. Bend your knees so they point up to the ceiling. Your feet should be flat on the floor. 3. Tighten your lower belly (abdomen) muscles to press your lower back against the floor. This will make your tailbone point up to the ceiling instead of pointing down to your feet or the floor. 4. Stay in this position for 5-10 seconds while you gently tighten your muscles and breathe evenly.  Cat-Cow  Do these steps until your lower back bends more easily: 1. Get on your hands and knees on a firm surface. Keep your hands under your shoulders, and keep your knees under your hips. You may put padding under your knees. 2. Let your head hang down, and make your tailbone point down to the floor so your lower back is round like the back of a cat. 3. Stay in this position for 5 seconds. 4. Slowly lift your head and make your tailbone point up to the ceiling so your back hangs low (sags) like the back of a cow. 5. Stay in this position for 5 seconds.  Press-Ups  Do these steps 5-10 times in a row: 1. Lie on your belly (face-down)  on the floor. 2. Place your hands near your head, about shoulder-width apart. 3. While you keep your back relaxed and keep your hips on the floor, slowly straighten your arms to raise the top half of your body and lift your shoulders. Do not use your back muscles. To make yourself more comfortable, you may change where you place your hands. 4. Stay in this position for 5 seconds. 5. Slowly return to lying flat on the floor.  Bridges  Do these steps 10 times in a row: 1. Lie on your back on a firm surface. 2. Bend your knees so they point up to the ceiling. Your feet should be flat on the floor. 3. Tighten your butt muscles and lift your butt off of the floor until your waist is almost as high as your knees. If you do not feel the muscles working in your butt and the back of your thighs, slide your feet 1-2 inches farther away from your butt. 4. Stay in this position for 3-5 seconds. 5. Slowly lower your butt to the floor, and let your butt muscles relax.  If this exercise is too easy, try doing it with your arms crossed over your chest. Belly Crunches  Do these steps 5-10 times in  a row: 1. Lie on your back on a firm bed or the floor with your legs stretched out. 2. Bend your knees so they point up to the ceiling. Your feet should be flat on the floor. 3. Cross your arms over your chest. 4. Tip your chin a little bit toward your chest but do not bend your neck. 5. Tighten your belly muscles and slowly raise your chest just enough to lift your shoulder blades a tiny bit off of the floor. 6. Slowly lower your chest and your head to the floor.  Back Lifts Do these steps 5-10 times in a row: 1. Lie on your belly (face-down) with your arms at your sides, and rest your forehead on the floor. 2. Tighten the muscles in your legs and your butt. 3. Slowly lift your chest off of the floor while you keep your hips on the floor. Keep the back of your head in line with the curve in your back. Look at  the floor while you do this. 4. Stay in this position for 3-5 seconds. 5. Slowly lower your chest and your face to the floor.  Contact a doctor if:  Your back pain gets a lot worse when you do an exercise.  Your back pain does not lessen 2 hours after you exercise. If you have any of these problems, stop doing the exercises. Do not do them again unless your doctor says it is okay. Get help right away if:  You have sudden, very bad back pain. If this happens, stop doing the exercises. Do not do them again unless your doctor says it is okay. This information is not intended to replace advice given to you by your health care provider. Make sure you discuss any questions you have with your health care provider. Document Released: 04/26/2010 Document Revised: 08/30/2015 Document Reviewed: 05/18/2014 Elsevier Interactive Patient Education  2018 [place] Injury Prevention Back injuries can be very painful. They can also be difficult to heal. After having one back injury, you are more likely to injure your back again. It is important to learn how to avoid injuring or re-injuring your back. The following tips can help you to prevent a back injury. What should I know about physical fitness?  Exercise for 30 minutes per day on most days of the week or as told by your doctor. Make sure to: ? Do aerobic exercises, such as walking, jogging, biking, or swimming. ? Do exercises that increase balance and strength, such as tai chi and yoga. ? Do stretching exercises. This helps with flexibility. ? Try to develop strong belly (abdominal) muscles. Your belly muscles help to support your back.  Stay at a healthy weight. This helps to decrease your risk of a back injury. What should I know about my diet?  Talk with your doctor about your overall diet. Take supplements and vitamins only as told by your doctor.  Talk with your doctor about how much calcium and vitamin D you need each day. These  nutrients help to prevent weakening of the bones (osteoporosis).  Include good sources of calcium in your diet, such as: ? Dairy products. ? Green leafy vegetables. ? Products that have had calcium added to them (fortified).  Include good sources of vitamin D in your diet, such as: ? Milk. ? Foods that have had vitamin D added to them. What should I know about my posture?  Sit up straight and stand up straight. Avoid leaning forward when you  sit or hunching over when you stand.  Choose chairs that have good low-back (lumbar) support.  If you work at a desk, sit close to it so you do not need to lean over. Keep your chin tucked in. Keep your neck drawn back. Keep your elbows bent so your arms look like the letter "L" (right angle).  Sit high and close to the steering wheel when you drive. Add a low-back support to your car seat, if needed.  Avoid sitting or standing in one position for very long. Take breaks to get up, stretch, and walk around at least one time every hour. Take breaks every hour if you are driving for long periods of time.  Sleep on your side with your knees slightly bent, or sleep on your back with a pillow under your knees. Do not lie on the front of your body to sleep. What should I know about lifting, twisting, and reaching? Lifting and Heavy Lifting   Avoid heavy lifting, especially lifting over and over again. If you must do heavy lifting: ? Stretch before lifting. ? Work slowly. ? Rest between lifts. ? Use a tool such as a cart or a dolly to move objects if one is available. ? Make several small trips instead of carrying one heavy load. ? Ask for help when you need it, especially when moving big objects.  Follow these steps when lifting: ? Stand with your feet shoulder-width apart. ? Get as close to the object as you can. Do not pick up a heavy object that is far from your body. ? Use handles or lifting straps if they are available. ? Bend at your knees.  Squat down, but keep your heels off the floor. ? Keep your shoulders back. Keep your chin tucked in. Keep your back straight. ? Lift the object slowly while you tighten the muscles in your legs, belly, and butt. Keep the object as close to the center of your body as possible.  Follow these steps when putting down a heavy load: ? Stand with your feet shoulder-width apart. ? Lower the object slowly while you tighten the muscles in your legs, belly, and butt. Keep the object as close to the center of your body as possible. ? Keep your shoulders back. Keep your chin tucked in. Keep your back straight. ? Bend at your knees. Squat down, but keep your heels off the floor. ? Use handles or lifting straps if they are available. Twisting and Reaching  Avoid lifting heavy objects above your waist.  Do not twist at your waist while you are lifting or carrying a load. If you need to turn, move your feet.  Do not bend over without bending at your knees.  Avoid reaching over your head, across a table, or for an object on a high surface. What are some other tips?  Avoid wet floors and icy ground. Keep sidewalks clear of ice to prevent falls.  Do not sleep on a mattress that is too soft or too hard.  Keep items that you use often within easy reach.  Put heavier objects on shelves at waist level, and put lighter objects on lower or higher shelves.  Find ways to lower your stress, such as: ? Exercise. ? Massage. ? Relaxation techniques.  Talk with your doctor if you feel anxious or depressed. These conditions can make back pain worse.  Wear flat heel shoes with cushioned soles.  Avoid making quick (sudden) movements.  Use both shoulder straps when  carrying a backpack.  Do not use any tobacco products, including cigarettes, chewing tobacco, or electronic cigarettes. If you need help quitting, ask your doctor. This information is not intended to replace advice given to you by your health care  provider. Make sure you discuss any questions you have with your health care provider. Document Released: 09/10/2007 Document Revised: 08/30/2015 Document Reviewed: 03/28/2014 Elsevier Interactive Patient Education  2018 [place] therapy can help ease sore, stiff, injured, and tight muscles and joints. Heat relaxes your muscles, which may help ease your pain. Heat therapy should only be used on old, pre-existing, or long-lasting (chronic) injuries. Do not use heat therapy unless told by your doctor. How to use heat therapy There are several different kinds of heat therapy, including:  Moist heat pack.  Warm water bath.  Hot water bottle.  Electric heating pad.  Heated gel pack.  Heated wrap.  Electric heating pad.  General heat therapy recommendations  Do not sleep while using heat therapy. Only use heat therapy while you are awake.  Your skin may turn pink while using heat therapy. Do not use heat therapy if your skin turns red.  Do not use heat therapy if you have new pain.  High heat or long exposure to heat can cause burns. Be careful when using heat therapy to avoid burning your skin.  Do not use heat therapy on areas of your skin that are already irritated, such as with a rash or sunburn. Get help if:  You have blisters, redness, swelling (puffiness), or numbness.  You have new pain.  Your pain is worse. This information is not intended to replace advice given to you by your health care provider. Make sure you discuss any questions you have with your health care provider. Document Released: 06/16/2011 Document Revised: 08/30/2015 Document Reviewed: 05/17/2013 Elsevier Interactive Patient Education  2018 [place].  Muscle Cramps and Spasms Muscle cramps and spasms are when muscles tighten by themselves. They usually get better within minutes. Muscle cramps are painful. They are usually stronger and last longer than muscle spasms. Muscle  spasms may or may not be painful. They can last a few seconds or much longer. Follow these instructions at home:  Drink enough fluid to keep your pee (urine) clear or pale yellow.  Massage, stretch, and relax the muscle.  If directed, apply heat to tight or tense muscles as often as told by your doctor. Use the heat source that your doctor recommends. ? Place a towel between your skin and the heat source. ? Leave the heat on for 20-30 minutes. ? Take off the heat if your skin turns bright red. This is especially important if you are unable to feel pain, heat, or cold. You may have a greater risk of getting burned.  If directed, put ice on the affected area. This may help if you are sore or have pain after a cramp or spasm. ? Put ice in a plastic bag. ? Place a towel between your skin and the bag. ? Leave the ice on for 20 minutes, 2-3 times a day.  Take over-the-counter and prescription medicines only as told by your doctor.  Pay attention to any changes in your symptoms. Contact a doctor if:  Your cramps or spasms get worse or happen more often.  Your cramps or spasms do not get better with time. This information is not intended to replace advice given to you by your health care provider. Make sure  you discuss any questions you have with your health care provider. Document Released: 03/06/2008 Document Revised: 04/25/2015 Document Reviewed: 12/26/2014 Elsevier Interactive Patient Education  2018 [employer].

## 2016-09-05 NOTE — Progress Notes (Addendum)
Name: Jesse Lawrence.   MRN: [id number]    DOB: 06-Jan-1961   Date:09/05/2016       Progress Note  Subjective  Chief Complaint  Chief Complaint  Patient presents with  . Back Pain    for 2 weeks with spasm , has gradually got worse    HPI  Pt presents with progressively worsening back pain since working on his mother's car. He has taken the last of his Baclofen over the last 2 weeks with minimal relief. He has also been trying heat on the area without relief.  Patient Active Problem List   Diagnosis Date Noted  . Sepsis ([place]) 07/24/2016  . OSA (obstructive sleep apnea) 05/21/2015  . Perennial allergic rhinitis 11/07/2014  . Arteriosclerosis of coronary artery 11/07/2014  . CD (contact dermatitis) 11/07/2014  . Decreased libido 11/07/2014  . Diabetes mellitus with renal manifestation ([place]) 11/07/2014  . Gastro-esophageal reflux disease without esophagitis 11/07/2014  . H/O acute myocardial infarction 11/07/2014  . Hypercholesteremia 11/07/2014  . Benign hypertension 11/07/2014  . Hypertriglyceridemia 11/07/2014  . H/O high risk medication treatment 11/07/2014  . NASH (nonalcoholic steatohepatitis) 11/07/2014  . Microalbuminuria 11/07/2014  . Adult BMI 30+ 11/07/2014  . Fungal infection of toenail 11/07/2014  . Tobacco abuse 11/07/2014  . ED (erectile dysfunction) of organic origin 09/29/2006    Social History  Substance Use Topics  . Smoking status: Former Smoker    Packs/day: 1.50    Years: 30.00    Types: Cigarettes    Quit date: 04/19/2013  . Smokeless tobacco: Current User  . Alcohol use 0.0 oz/week     Comment: occ     Current Outpatient Prescriptions:  .  atorvastatin (LIPITOR) 80 MG tablet, Take 1 tablet (80 mg total) by mouth daily at 6 PM., Disp: 30 tablet, Rfl: 5 .  baclofen (LIORESAL) 10 MG tablet, Take 1 tablet (10 mg total) by mouth 3 (three) times daily., Disp: 30 each, Rfl: 0 .  clonazePAM (KLONOPIN) 0.5 MG tablet, Take 1 tablet (0.5 mg total) by  mouth 2 (two) times daily as needed for anxiety., Disp: 20 tablet, Rfl: 1 .  clopidogrel (PLAVIX) 75 MG tablet, Take 1 tablet (75 mg total) by mouth daily., Disp: 30 tablet, Rfl: 5 .  ezetimibe (ZETIA) 10 MG tablet, Take 1 tablet (10 mg total) by mouth daily., Disp: 30 tablet, Rfl: 11 .  fluticasone (FLONASE) 50 MCG/ACT nasal spray, Place 2 sprays into both nostrils as needed., Disp: 48 g, Rfl: 1 .  glucose blood (ONE TOUCH ULTRA TEST) test strip, Use as instructed, Disp: 100 each, Rfl: 12 .  lisinopril (PRINIVIL,ZESTRIL) 10 MG tablet, Take 1 tablet (10 mg total) by mouth daily., Disp: 30 tablet, Rfl: 5 .  loratadine (CLARITIN) 10 MG tablet, Take 1 tablet by mouth daily., Disp: , Rfl:  .  metoprolol succinate (TOPROL-XL) 25 MG 24 hr tablet, Take 1 tablet (25 mg total) by mouth daily., Disp: 30 tablet, Rfl: 5 .  ranitidine (ZANTAC) 300 MG tablet, Take 0.5 tablets (150 mg total) by mouth 2 (two) times daily., Disp: 60 tablet, Rfl: 5 .  sildenafil (REVATIO) 20 MG tablet, TAKE 1 TO 5 TABLETS BY MOUTH AS NEEDED, Disp: 50 tablet, Rfl: 1 .  SYMBICORT 160-4.5 MCG/ACT inhaler, INHALE 2 PUFFS INTO THE LUNGS 2 (TWO) TIMES DAILY., Disp: 10.2 Inhaler, Rfl: 0 .  TRULICITY 1.5 ES/9.7NP SOPN, INJECT 1.5 MG INTO THE SKIN ONCE WEEKLY, Disp: 4 pen, Rfl: 0 .  VASCEPA 1 g CAPS,  TAKE 2 CAPSULES BY MOUTH TWICE DAILY, Disp: 120 capsule, Rfl: 5 .  XIGDUO XR 08-998 MG TB24, Take 1 tablet by mouth 2 (two) times daily., Disp: 60 tablet, Rfl: 5  No Known Allergies  ROS  Constitutional: Negative for fever or weight change.  Respiratory: Negative for cough and shortness of breath.   Cardiovascular: Negative for chest pain or palpitations.  Gastrointestinal: Negative for abdominal pain, no bowel changes.  Musculoskeletal: Negative for gait problem or joint swelling. Back spasm present. No numbness/tingling, no bowel/bladder incontinence. Skin: Negative for rash.  Neurological: Negative for dizziness or headache.  No other  specific complaints in a complete review of systems (except as listed in HPI above).  Objective  Vitals:   09/05/16 0838  BP: 124/86  Pulse: 97  Resp: 18  Temp: 98.1 F (36.7 C)  TempSrc: Oral  SpO2: 99%  Weight: 221 lb 3.2 oz (100.3 kg)  Height: _0  (1.727 m)    Body mass index is 33.63 kg/m.  Nursing Note and Vital Signs reviewed.  Physical Exam  Musculoskeletal:       Arms:  Constitutional: Patient appears well-developed and well-nourished. Obese No distress.  HEENT: head atraumatic, normocephalic Cardiovascular: Normal rate, regular rhythm, S1/S2 present.  No murmur or rub heard. No BLE edema. Pulmonary/Chest: Effort normal and breath sounds clear. No respiratory distress or retractions. Abdominal: Soft and non-tender, bowel sounds present x4 quadrants. MSK: No spinal tenderness, left side of Thoracic back has palpable spasm and is tender on palpation, limited spinal ROM.  No weakness in any extremity. Pt is in obvious discomfort during history & examination - pacing and grunting intermittently, unable to find a comfortable position. Neuro: No cranial nerve deficit Psychiatric: Patient has a normal mood and affect. behavior is normal. Judgment and thought content normal.  Recent Results (from the past 2160 hour(s))  Basic metabolic panel     Status: Abnormal   Collection Time: 07/23/16  9:46 PM  Result Value Ref Range   Sodium 135 135 - 145 mmol/L   Potassium 4.0 3.5 - 5.1 mmol/L   Chloride 103 101 - 111 mmol/L   CO2 23 22 - 32 mmol/L   Glucose, Bld 396 (H) 65 - 99 mg/dL   BUN 19 6 - 20 mg/dL   Creatinine, Ser 0.95 0.61 - 1.24 mg/dL   Calcium 9.1 8.9 - 10.3 mg/dL   GFR calc non Af Amer >60 >60 mL/min   GFR calc Af Amer >60 >60 mL/min    Comment: (NOTE) The eGFR has been calculated using the CKD EPI equation. This calculation has not been validated in all clinical situations. eGFR's persistently <60 mL/min signify possible Chronic Kidney Disease.    Anion  gap 9 5 - 15  CBC     Status: Abnormal   Collection Time: 07/23/16  9:46 PM  Result Value Ref Range   WBC 7.4 3.8 - 10.6 K/uL   RBC 4.32 (L) 4.40 - 5.90 MIL/uL   Hemoglobin 13.9 13.0 - 18.0 g/dL   HCT 39.4 (L) 40.0 - 52.0 %   MCV 91.2 80.0 - 100.0 fL   MCH 32.2 26.0 - 34.0 pg   MCHC 35.3 32.0 - 36.0 g/dL   RDW 12.4 11.5 - 14.5 %   Platelets 222 150 - 440 K/uL  Troponin I     Status: None   Collection Time: 07/23/16  9:46 PM  Result Value Ref Range   Troponin I <0.03 <0.03 ng/mL  Lactic acid, plasma  Status: Abnormal   Collection Time: 07/23/16 10:56 PM  Result Value Ref Range   Lactic Acid, Venous 2.0 (HH) 0.5 - 1.9 mmol/L    Comment: CRITICAL RESULT CALLED TO, READ BACK BY AND VERIFIED WITH IRIS GUIDRY ON 07/23/16 AT 2348 BY TLB   Blood Culture (routine x 2)     Status: None   Collection Time: 07/23/16 10:56 PM  Result Value Ref Range   Specimen Description BLOOD R AC    Special Requests Blood Culture adequate volume    Culture NO GROWTH 5 DAYS    Report Status 07/28/2016 FINAL   Blood Culture (routine x 2)     Status: None   Collection Time: 07/23/16 10:56 PM  Result Value Ref Range   Specimen Description BLOOD R FA    Special Requests Blood Culture adequate volume    Culture NO GROWTH 5 DAYS    Report Status 07/28/2016 FINAL   Influenza panel by PCR (type A & B)     Status: None   Collection Time: 07/23/16 10:56 PM  Result Value Ref Range   Influenza A By PCR NEGATIVE NEGATIVE   Influenza B By PCR NEGATIVE NEGATIVE    Comment: (NOTE) The Xpert Xpress Flu assay is intended as an aid in the diagnosis of  influenza and should not be used as a sole basis for treatment.  This  assay is FDA approved for nasopharyngeal swab specimens only. Nasal  washings and aspirates are unacceptable for Xpert Xpress Flu testing.   Lactic acid, plasma     Status: None   Collection Time: 07/24/16  1:32 AM  Result Value Ref Range   Lactic Acid, Venous 1.8 0.5 - 1.9 mmol/L  Glucose,  capillary     Status: Abnormal   Collection Time: 07/24/16  4:05 AM  Result Value Ref Range   Glucose-Capillary 127 (H) 65 - 99 mg/dL  HIV antibody (Routine Testing)     Status: None   Collection Time: 07/24/16  4:57 AM  Result Value Ref Range   HIV Screen 4th Generation wRfx Non Reactive Non Reactive    Comment: (NOTE) Performed At: [facility] [address] [place], [place] [id number] Lindon Romp MD [id number]   TSH     Status: None   Collection Time: 07/24/16  4:57 AM  Result Value Ref Range   TSH 1.655 0.350 - 4.500 uIU/mL    Comment: Performed by a 3rd Generation assay with a functional sensitivity of <=0.01 uIU/mL.  Hemoglobin A1c     Status: Abnormal   Collection Time: 07/24/16  4:57 AM  Result Value Ref Range   Hgb A1c MFr Bld 8.3 (H) 4.8 - 5.6 %    Comment: (NOTE)         Pre-diabetes: 5.7 - 6.4         Diabetes: >6.4         Glycemic control for adults with diabetes: <7.0    Mean Plasma Glucose 192 mg/dL    Comment: (NOTE) Performed At: [facility] [place], [place] [id number] Lindon Romp MD [id number]:[id number]   Glucose, capillary     Status: Abnormal   Collection Time: 07/24/16  7:39 AM  Result Value Ref Range   Glucose-Capillary 155 (H) 65 - 99 mg/dL  Glucose, capillary     Status: Abnormal   Collection Time: 07/24/16 11:39 AM  Result Value Ref Range   Glucose-Capillary 121 (H) 65 - 99 mg/dL     Assessment &  Plan  1. Spasm of back muscles - metaxalone (SKELAXIN) 800 MG tablet; Take 1 tablet (800 mg total) by mouth 3 (three) times daily.  Dispense: 30 tablet; Refill: 0 - meloxicam (MOBIC) 15 MG tablet; Take 1 tablet (15 mg total) by mouth daily.  Dispense: 15 tablet; Refill: 0 -Advised to use heat as tolerated and gentle stretching as tolerated. -Back exercises provided in AVS and recommended only when pain has improved and to only advance as tolerated. -Pt will call back in 2-3 days if no improvement and we will  refer to ortho.  Trigger Point Injection Performed: Consent form signed Localized muscle group Injection with lidocaine 1% and Kenalog 51m/1 ml on muscle  Patient tolerated procedure well No side effects  -Red flags and when to present for emergency care or RTC including fever >101.28F, chest pain, shortness of breath, new/worsening/un-resolving symptoms, saddle anesthesia, numbness/tingling/weakness in any extremity reviewed with patient at time of visit. Follow up and care instructions discussed and provided in AVS. -Reviewed Health Maintenance: Needs to schedule eye exam.  I have reviewed this encounter including the documentation in this note and/or discussed this patient with the pJohney Maine FNP, NP-C. I am certifying that I agree with the content of this note as supervising physician.  KSteele Sizer MD [facility] 09/14/2016, 4:48 PM

## 2016-09-10 ENCOUNTER — Other Ambulatory Visit: Payer: Self-pay | Admitting: Family Medicine

## 2016-09-10 DIAGNOSIS — N529 Male erectile dysfunction, unspecified: Secondary | ICD-10-CM

## 2016-09-11 ENCOUNTER — Encounter: Payer: Self-pay | Admitting: Family Medicine

## 2016-09-11 MED ORDER — SILDENAFIL CITRATE 20 MG PO TABS: ORAL_TABLET | ORAL | 0 refills | Status: DC

## 2016-09-11 NOTE — Telephone Encounter (Signed)
Please call patient and ask that he schedule a chronic problem follow up. He was seen in February and was supposed to have a 55mofollow up. Refill provided.  Thank you!

## 2016-09-13 ENCOUNTER — Telehealth: Payer: Self-pay | Admitting: Family Medicine

## 2016-09-15 ENCOUNTER — Other Ambulatory Visit: Payer: Self-pay | Admitting: Family Medicine

## 2016-09-15 DIAGNOSIS — E7800 Pure hypercholesterolemia, unspecified: Secondary | ICD-10-CM

## 2016-09-15 NOTE — Telephone Encounter (Signed)
Patient requesting refill of Atorvastatin to [facility].

## 2016-09-15 NOTE — Telephone Encounter (Signed)
appt made for tomorrow

## 2016-09-16 ENCOUNTER — Ambulatory Visit (INDEPENDENT_AMBULATORY_CARE_PROVIDER_SITE_OTHER): Payer: Managed Care, Other (non HMO) | Admitting: Family Medicine

## 2016-09-16 ENCOUNTER — Encounter: Payer: Self-pay | Admitting: Family Medicine

## 2016-09-16 ENCOUNTER — Other Ambulatory Visit: Payer: Self-pay | Admitting: Family Medicine

## 2016-09-16 VITALS — BP 126/62 | HR 93 | Temp 98.2°F | Resp 18 | Ht 68.0 in | Wt 215.9 lb

## 2016-09-16 DIAGNOSIS — K219 Gastro-esophageal reflux disease without esophagitis: Secondary | ICD-10-CM | POA: Diagnosis not present

## 2016-09-16 DIAGNOSIS — E1121 Type 2 diabetes mellitus with diabetic nephropathy: Secondary | ICD-10-CM

## 2016-09-16 DIAGNOSIS — E781 Pure hyperglyceridemia: Secondary | ICD-10-CM | POA: Diagnosis not present

## 2016-09-16 DIAGNOSIS — J189 Pneumonia, unspecified organism: Secondary | ICD-10-CM

## 2016-09-16 DIAGNOSIS — E785 Hyperlipidemia, unspecified: Secondary | ICD-10-CM | POA: Diagnosis not present

## 2016-09-16 DIAGNOSIS — H9313 Tinnitus, bilateral: Secondary | ICD-10-CM

## 2016-09-16 DIAGNOSIS — E1169 Type 2 diabetes mellitus with other specified complication: Secondary | ICD-10-CM | POA: Diagnosis not present

## 2016-09-16 DIAGNOSIS — M62830 Muscle spasm of back: Secondary | ICD-10-CM

## 2016-09-16 DIAGNOSIS — F40243 Fear of flying: Secondary | ICD-10-CM

## 2016-09-16 MED ORDER — CLONAZEPAM 0.5 MG PO TABS: 0.5000 mg | ORAL_TABLET | Freq: Two times a day (BID) | ORAL | 0 refills | Status: DC | PRN

## 2016-09-16 MED ORDER — RANITIDINE HCL 150 MG PO TABS
150.0000 mg | ORAL_TABLET | Freq: Two times a day (BID) | ORAL | 1 refills | Status: DC
Start: 2016-09-16 — End: 2017-04-16

## 2016-09-16 MED ORDER — ICOSAPENT ETHYL 1 G PO CAPS: 2.0000 | ORAL_CAPSULE | Freq: Two times a day (BID) | ORAL | 5 refills | Status: DC

## 2016-09-16 MED ORDER — DULAGLUTIDE 1.5 MG/0.5ML ~~LOC~~ SOAJ: SUBCUTANEOUS | 2 refills | Status: DC

## 2016-09-16 NOTE — Progress Notes (Signed)
Name: Jesse Lawrence.   MRN: [id number]    DOB: 11-19-1960   Date:09/16/2016       Progress Note  Subjective  Chief Complaint  Chief Complaint  Patient presents with  . Medication Refill    4 month F/U  . Diabetes    Checking BS two times daily, Average-140-247  . Hypertension    Dizziness  . Gastroesophageal Reflux    Has been really bad for the past two days  . Hyperlipidemia  . Allergic Rhinitis     Head and ears has been throbbing, taking medication daily  . Anxiety    Needs refill of medication    HPI  DM II : He only checks glucose occasionally , fasting usually below 140, post-prandially up to 270's His hgbA1C has gone from 9.8% to 7.9%, up to 9.1% and now to 9.6% and now down to 7.8 %, while at [facility] up to 8.3% . He is on Trulicity 4.0JW he is also taking Xigduo 08/998 mg two daily, and has lost weight since last visit. He has noticed polydipsia, polyuria and polyphagia. He eats out for lunch almost every day  but has been avoiding pasta.  He is drinking sugar free drinks.  HTN/CAD: no chest pain, no palpitation, bp is at goal, no side effects of medication. He denies decrease in exercise tolerance  GERD: he is off Omeprazole, he is off energy drinks, yesterday he had severe indigestion ( States not associated with SOB or radiation/diaphoresis)   ED: he has difficulty maintaining and erection, uses Cialis prn, he is doing well on Sildenafil  OSA: he has not been compliant with CPAP machine, explained he needs to resume it  Back spasms: he is doing well now, he states Baclofen stopped working, but was given [employer] and is doing well now.  Obesity: he has lost some weight since last visit, he states he has been avoiding pasta.  Tinnitus: and at times associated with lightheaded. It is swishing sound at times and ringing at other times.   Patient Active Problem List   Diagnosis Date Noted  . OSA (obstructive sleep apnea) 05/21/2015  . Perennial allergic  rhinitis 11/07/2014  . Arteriosclerosis of coronary artery 11/07/2014  . CD (contact dermatitis) 11/07/2014  . Decreased libido 11/07/2014  . Diabetes mellitus with renal manifestation ([place]) 11/07/2014  . Gastro-esophageal reflux disease without esophagitis 11/07/2014  . H/O acute myocardial infarction 11/07/2014  . Hypercholesteremia 11/07/2014  . Benign hypertension 11/07/2014  . Hypertriglyceridemia 11/07/2014  . H/O high risk medication treatment 11/07/2014  . NASH (nonalcoholic steatohepatitis) 11/07/2014  . Microalbuminuria 11/07/2014  . Adult BMI 30+ 11/07/2014  . Fungal infection of toenail 11/07/2014  . Tobacco abuse 11/07/2014  . ED (erectile dysfunction) of organic origin 09/29/2006    Past Surgical History:  Procedure Laterality Date  . cadiac stenting    . CYSTECTOMY     56 years of age  . KNEE ARTHROSCOPY      Family History  Problem Relation Age of Onset  . Diabetes Mother   . CAD Mother   . Heart disease Mother   . Heart attack Mother   . Heart disease Father   . Heart attack Father     Social History   Social History  . Marital status: Married    Spouse name: N/A  . Number of children: N/A  . Years of education: N/A   Occupational History  . Not on file.   Social History Main Topics  .  Smoking status: Former Smoker    Packs/day: 1.50    Years: 30.00    Types: Cigarettes    Quit date: 04/19/2013  . Smokeless tobacco: Current User  . Alcohol use 0.0 oz/week     Comment: occ  . Drug use: No  . Sexual activity: Yes   Other Topics Concern  . Not on file   Social History Narrative  . No narrative on file     Current Outpatient Prescriptions:  .  atorvastatin (LIPITOR) 80 MG tablet, TAKE 1 TABLET (80 MG TOTAL) BY MOUTH DAILY AT 6 PM., Disp: 30 tablet, Rfl: 5 .  clonazePAM (KLONOPIN) 0.5 MG tablet, Take 1 tablet (0.5 mg total) by mouth 2 (two) times daily as needed for anxiety. Before flights, Disp: 6 tablet, Rfl: 0 .  clopidogrel  (PLAVIX) 75 MG tablet, Take 1 tablet (75 mg total) by mouth daily., Disp: 30 tablet, Rfl: 5 .  Dulaglutide (TRULICITY) 1.5 TK/3.5WS SOPN, INJECT 1.5 MG INTO THE SKIN ONCE WEEKLY, Disp: 4 pen, Rfl: 2 .  ezetimibe (ZETIA) 10 MG tablet, Take 1 tablet (10 mg total) by mouth daily., Disp: 30 tablet, Rfl: 11 .  fluticasone (FLONASE) 50 MCG/ACT nasal spray, Place 2 sprays into both nostrils as needed., Disp: 48 g, Rfl: 1 .  Icosapent Ethyl (VASCEPA) 1 g CAPS, Take 2 capsules by mouth 2 (two) times daily., Disp: 120 capsule, Rfl: 5 .  lisinopril (PRINIVIL,ZESTRIL) 10 MG tablet, Take 1 tablet (10 mg total) by mouth daily., Disp: 30 tablet, Rfl: 5 .  metaxalone (SKELAXIN) 800 MG tablet, Take 1 tablet (800 mg total) by mouth 3 (three) times daily., Disp: 30 tablet, Rfl: 0 .  metoprolol succinate (TOPROL-XL) 25 MG 24 hr tablet, Take 1 tablet (25 mg total) by mouth daily., Disp: 30 tablet, Rfl: 5 .  ranitidine (ZANTAC) 150 MG tablet, Take 1 tablet (150 mg total) by mouth 2 (two) times daily., Disp: 180 tablet, Rfl: 1 .  sildenafil (REVATIO) 20 MG tablet, TAKE 1 TO 5 TABLETS BY MOUTH AS NEEDED, Disp: 50 tablet, Rfl: 0 .  SYMBICORT 160-4.5 MCG/ACT inhaler, INHALE 2 PUFFS INTO THE LUNGS 2 (TWO) TIMES DAILY., Disp: 10.2 Inhaler, Rfl: 0 .  XIGDUO XR 08-998 MG TB24, Take 1 tablet by mouth 2 (two) times daily., Disp: 60 tablet, Rfl: 5 .  glucose blood (ONE TOUCH ULTRA TEST) test strip, Use as instructed (Patient not taking: Reported on 09/16/2016), Disp: 100 each, Rfl: 12 .  loratadine (CLARITIN) 10 MG tablet, Take 1 tablet by mouth daily., Disp: , Rfl:   No Known Allergies   ROS  Constitutional: Negative for fever , positive for weight change.  Respiratory: Negative for cough and shortness of breath.   Cardiovascular: Negative for chest pain or palpitations.  Gastrointestinal: Negative for abdominal pain, no bowel changes.  Musculoskeletal: Negative for gait problem or joint swelling.  Skin: Negative for rash.   Neurological: Negative for dizziness, positive for headache.  No other specific complaints in a complete review of systems (except as listed in HPI above).  Objective  Vitals:   09/16/16 1537  BP: 126/62  Pulse: 93  Resp: 18  Temp: 98.2 F (36.8 C)  TempSrc: Oral  SpO2: 97%  Weight: 215 lb 14.4 oz (97.9 kg)  Height: 5' 8"  (1.727 m)    Body mass index is 32.83 kg/m.  Physical Exam  Constitutional: Patient appears well-developed and well-nourished. Obese  No distress.  HEENT: head atraumatic, normocephalic, pupils equal and reactive to light,  neck supple, throat within normal limits Cardiovascular: Normal rate, regular rhythm and normal heart sounds.  No murmur heard. No BLE edema. Pulmonary/Chest: Effort normal and breath sounds normal. No respiratory distress. Abdominal: Soft.  There is no tenderness. Psychiatric: Patient has a normal mood and affect. behavior is normal. Judgment and thought content normal.  Recent Results (from the past 2160 hour(s))  Basic metabolic panel     Status: Abnormal   Collection Time: 07/23/16  9:46 PM  Result Value Ref Range   Sodium 135 135 - 145 mmol/L   Potassium 4.0 3.5 - 5.1 mmol/L   Chloride 103 101 - 111 mmol/L   CO2 23 22 - 32 mmol/L   Glucose, Bld 396 (H) 65 - 99 mg/dL   BUN 19 6 - 20 mg/dL   Creatinine, Ser 0.95 0.61 - 1.24 mg/dL   Calcium 9.1 8.9 - 10.3 mg/dL   GFR calc non Af Amer >60 >60 mL/min   GFR calc Af Amer >60 >60 mL/min    Comment: (NOTE) The eGFR has been calculated using the CKD EPI equation. This calculation has not been validated in all clinical situations. eGFR's persistently <60 mL/min signify possible Chronic Kidney Disease.    Anion gap 9 5 - 15  CBC     Status: Abnormal   Collection Time: 07/23/16  9:46 PM  Result Value Ref Range   WBC 7.4 3.8 - 10.6 K/uL   RBC 4.32 (L) 4.40 - 5.90 MIL/uL   Hemoglobin 13.9 13.0 - 18.0 g/dL   HCT 39.4 (L) 40.0 - 52.0 %   MCV 91.2 80.0 - 100.0 fL   MCH 32.2 26.0 -  34.0 pg   MCHC 35.3 32.0 - 36.0 g/dL   RDW 12.4 11.5 - 14.5 %   Platelets 222 150 - 440 K/uL  Troponin I     Status: None   Collection Time: 07/23/16  9:46 PM  Result Value Ref Range   Troponin I <0.03 <0.03 ng/mL  Lactic acid, plasma     Status: Abnormal   Collection Time: 07/23/16 10:56 PM  Result Value Ref Range   Lactic Acid, Venous 2.0 (HH) 0.5 - 1.9 mmol/L    Comment: CRITICAL RESULT CALLED TO, READ BACK BY AND VERIFIED WITH IRIS GUIDRY ON 07/23/16 AT 2348 BY TLB   Blood Culture (routine x 2)     Status: None   Collection Time: 07/23/16 10:56 PM  Result Value Ref Range   Specimen Description BLOOD R AC    Special Requests Blood Culture adequate volume    Culture NO GROWTH 5 DAYS    Report Status 07/28/2016 FINAL   Blood Culture (routine x 2)     Status: None   Collection Time: 07/23/16 10:56 PM  Result Value Ref Range   Specimen Description BLOOD R FA    Special Requests Blood Culture adequate volume    Culture NO GROWTH 5 DAYS    Report Status 07/28/2016 FINAL   Influenza panel by PCR (type A & B)     Status: None   Collection Time: 07/23/16 10:56 PM  Result Value Ref Range   Influenza A By PCR NEGATIVE NEGATIVE   Influenza B By PCR NEGATIVE NEGATIVE    Comment: (NOTE) The Xpert Xpress Flu assay is intended as an aid in the diagnosis of  influenza and should not be used as a sole basis for treatment.  This  assay is FDA approved for nasopharyngeal swab specimens only. Nasal  washings and aspirates  are unacceptable for Xpert Xpress Flu testing.   Lactic acid, plasma     Status: None   Collection Time: 07/24/16  1:32 AM  Result Value Ref Range   Lactic Acid, Venous 1.8 0.5 - 1.9 mmol/L  Glucose, capillary     Status: Abnormal   Collection Time: 07/24/16  4:05 AM  Result Value Ref Range   Glucose-Capillary 127 (H) 65 - 99 mg/dL  HIV antibody (Routine Testing)     Status: None   Collection Time: 07/24/16  4:57 AM  Result Value Ref Range   HIV Screen 4th Generation  wRfx Non Reactive Non Reactive    Comment: (NOTE) Performed At: [facility] [address] [place], [place] [id number] Lindon Romp MD [id number]   TSH     Status: None   Collection Time: 07/24/16  4:57 AM  Result Value Ref Range   TSH 1.655 0.350 - 4.500 uIU/mL    Comment: Performed by a 3rd Generation assay with a functional sensitivity of <=0.01 uIU/mL.  Hemoglobin A1c     Status: Abnormal   Collection Time: 07/24/16  4:57 AM  Result Value Ref Range   Hgb A1c MFr Bld 8.3 (H) 4.8 - 5.6 %    Comment: (NOTE)         Pre-diabetes: 5.7 - 6.4         Diabetes: >6.4         Glycemic control for adults with diabetes: <7.0    Mean Plasma Glucose 192 mg/dL    Comment: (NOTE) Performed At: [facility] [address] [place], [place] [id number] Lindon Romp MD [id number]   Glucose, capillary     Status: Abnormal   Collection Time: 07/24/16  7:39 AM  Result Value Ref Range   Glucose-Capillary 155 (H) 65 - 99 mg/dL  Glucose, capillary     Status: Abnormal   Collection Time: 07/24/16 11:39 AM  Result Value Ref Range   Glucose-Capillary 121 (H) 65 - 99 mg/dL     PHQ2/9: Depression screen [facility] 2/9 07/30/2016 05/26/2016 11/16/2015 08/17/2015 07/04/2015  Decreased Interest 0 0 0 0 0  Down, Depressed, Hopeless 0 0 0 0 0  PHQ - 2 Score 0 0 0 0 0     Fall Risk: Fall Risk  07/30/2016 05/26/2016 11/16/2015 08/17/2015 07/04/2015  Falls in the past year? No No No No No     Functional Status Survey: Is the patient deaf or have difficulty hearing?: No Does the patient have difficulty seeing, even when wearing glasses/contacts?: No Does the patient have difficulty concentrating, remembering, or making decisions?: No Does the patient have difficulty walking or climbing stairs?: No Does the patient have difficulty dressing or bathing?: No Does the patient have difficulty doing errands alone such as visiting a doctor's office or shopping?: No    Assessment &  Plan  1. Type 2 diabetes mellitus with diabetic nephropathy, without long-term current use of insulin (HCC)  - Dulaglutide (TRULICITY) 1.5 KN/3.9JQ SOPN; INJECT 1.5 MG INTO THE SKIN ONCE WEEKLY  Dispense: 4 pen; Refill: 2  2. Dyslipidemia associated with type 2 diabetes mellitus (HCC)  - Icosapent Ethyl (VASCEPA) 1 g CAPS; Take 2 capsules by mouth 2 (two) times daily.  Dispense: 120 capsule; Refill: 5  3. Hypertriglyceridemia  - Icosapent Ethyl (VASCEPA) 1 g CAPS; Take 2 capsules by mouth 2 (two) times daily.  Dispense: 120 capsule; Refill: 5  4. Gastro-esophageal reflux disease without esophagitis  - ranitidine (ZANTAC) 150 MG tablet;  Take 1 tablet (150 mg total) by mouth 2 (two) times daily.  Dispense: 180 tablet; Refill: 1  5. Fear of flying  - clonazePAM (KLONOPIN) 0.5 MG tablet; Take 1 tablet (0.5 mg total) by mouth 2 (two) times daily as needed for anxiety. Before flights  Dispense: 6 tablet; Refill: 0  6. Spasm of back muscles  Doing well, stop Meloxicam and may continue Skelaxin prn   7. Community acquired pneumonia, unspecified laterality  States wheezing resolved, no cough or SOB - DG Chest 2 View; Future  8. Tinnitus of both ears  Swishing sound, discussed doppler US, but he wants to hold off, he states Dr. Rockey Situ advised him to check bp when he has the swishing sound.

## 2016-09-18 MED ORDER — METAXALONE 800 MG PO TABS: 800.0000 mg | ORAL_TABLET | Freq: Three times a day (TID) | ORAL | 0 refills | Status: DC | PRN

## 2016-09-18 NOTE — Telephone Encounter (Signed)
Per most recent appointment with Dr. Ancil Boozer, pt to stop Meloxicam and only take Skelaxin as needed. Skelaxin is refilled.

## 2016-09-25 ENCOUNTER — Other Ambulatory Visit: Payer: Self-pay | Admitting: Family Medicine

## 2016-09-25 DIAGNOSIS — E1121 Type 2 diabetes mellitus with diabetic nephropathy: Secondary | ICD-10-CM

## 2016-09-26 NOTE — Telephone Encounter (Signed)
Patient requesting refill of [facility] Touch [facility] Test to [facility].

## 2016-10-15 ENCOUNTER — Other Ambulatory Visit: Payer: Self-pay | Admitting: Family Medicine

## 2016-10-15 DIAGNOSIS — J069 Acute upper respiratory infection, unspecified: Secondary | ICD-10-CM

## 2016-10-15 DIAGNOSIS — R05 Cough: Secondary | ICD-10-CM

## 2016-10-15 DIAGNOSIS — R059 Cough, unspecified: Secondary | ICD-10-CM

## 2016-10-15 NOTE — Telephone Encounter (Signed)
Patient requesting [facility] of [facility]ort [facility]

## 2016-11-16 ENCOUNTER — Other Ambulatory Visit: Payer: Self-pay | Admitting: Family Medicine

## 2016-11-16 DIAGNOSIS — N529 Male erectile dysfunction, unspecified: Secondary | ICD-10-CM

## 2016-11-17 NOTE — Telephone Encounter (Signed)
Patient requesting refill of Sildenafil to [facility].

## 2016-12-19 ENCOUNTER — Ambulatory Visit: Payer: Managed Care, Other (non HMO) | Admitting: Family Medicine

## 2016-12-22 ENCOUNTER — Encounter: Payer: Self-pay | Admitting: Family Medicine

## 2016-12-22 ENCOUNTER — Ambulatory Visit (INDEPENDENT_AMBULATORY_CARE_PROVIDER_SITE_OTHER): Payer: Managed Care, Other (non HMO) | Admitting: Family Medicine

## 2016-12-22 VITALS — BP 122/68 | HR 84 | Temp 98.4°F | Resp 16 | Ht 68.0 in | Wt 222.2 lb

## 2016-12-22 DIAGNOSIS — E1121 Type 2 diabetes mellitus with diabetic nephropathy: Secondary | ICD-10-CM | POA: Diagnosis not present

## 2016-12-22 DIAGNOSIS — E785 Hyperlipidemia, unspecified: Secondary | ICD-10-CM

## 2016-12-22 DIAGNOSIS — K7581 Nonalcoholic steatohepatitis (NASH): Secondary | ICD-10-CM | POA: Diagnosis not present

## 2016-12-22 DIAGNOSIS — E781 Pure hyperglyceridemia: Secondary | ICD-10-CM

## 2016-12-22 DIAGNOSIS — K219 Gastro-esophageal reflux disease without esophagitis: Secondary | ICD-10-CM

## 2016-12-22 DIAGNOSIS — I10 Essential (primary) hypertension: Secondary | ICD-10-CM

## 2016-12-22 DIAGNOSIS — E1169 Type 2 diabetes mellitus with other specified complication: Secondary | ICD-10-CM

## 2016-12-22 DIAGNOSIS — G4733 Obstructive sleep apnea (adult) (pediatric): Secondary | ICD-10-CM

## 2016-12-22 DIAGNOSIS — I252 Old myocardial infarction: Secondary | ICD-10-CM | POA: Diagnosis not present

## 2016-12-22 DIAGNOSIS — E7800 Pure hypercholesterolemia, unspecified: Secondary | ICD-10-CM

## 2016-12-22 DIAGNOSIS — I2510 Atherosclerotic heart disease of native coronary artery without angina pectoris: Secondary | ICD-10-CM

## 2016-12-22 DIAGNOSIS — Z9119 Patient's noncompliance with other medical treatment and regimen: Secondary | ICD-10-CM

## 2016-12-22 DIAGNOSIS — Z91199 Patient's noncompliance with other medical treatment and regimen due to unspecified reason: Secondary | ICD-10-CM

## 2016-12-22 LAB — POCT GLYCOSYLATED HEMOGLOBIN (HGB A1C): Hemoglobin A1C: 8.3

## 2016-12-22 LAB — GLUCOSE, POCT (MANUAL RESULT ENTRY): POC Glucose: 133 mg/dl — AB (ref 70–99)

## 2016-12-22 MED ORDER — METOPROLOL SUCCINATE ER 25 MG PO TB24: 25.0000 mg | ORAL_TABLET | Freq: Every day | ORAL | 1 refills | Status: DC

## 2016-12-22 MED ORDER — LISINOPRIL 10 MG PO TABS: 10.0000 mg | ORAL_TABLET | Freq: Every day | ORAL | 1 refills | Status: DC

## 2016-12-22 MED ORDER — SEMAGLUTIDE (1 MG/DOSE) 2 MG/1.5ML ~~LOC~~ SOPN: 1.0000 mg | PEN_INJECTOR | SUBCUTANEOUS | 2 refills | Status: DC

## 2016-12-22 MED ORDER — XIGDUO XR 5-1000 MG PO TB24: 1.0000 | ORAL_TABLET | Freq: Two times a day (BID) | ORAL | 5 refills | Status: DC

## 2016-12-22 MED ORDER — ICOSAPENT ETHYL 1 G PO CAPS: 2.0000 | ORAL_CAPSULE | Freq: Two times a day (BID) | ORAL | 1 refills | Status: DC

## 2016-12-22 MED ORDER — CLOPIDOGREL BISULFATE 75 MG PO TABS: 75.0000 mg | ORAL_TABLET | Freq: Every day | ORAL | 1 refills | Status: DC

## 2016-12-22 MED ORDER — ATORVASTATIN CALCIUM 80 MG PO TABS
80.0000 mg | ORAL_TABLET | Freq: Every day | ORAL | 1 refills | Status: DC
Start: 2016-12-22 — End: 2017-08-14

## 2016-12-22 MED ORDER — FREESTYLE LIBRE SENSOR SYSTEM MISC: 1.0000 | Freq: Three times a day (TID) | 0 refills | Status: DC

## 2016-12-22 NOTE — Progress Notes (Signed)
Name: Jesse Lawrence.   MRN: [id number]    DOB: [birth year]   Date:12/22/2016       Progress Note  Subjective  Chief Complaint  Chief Complaint  Patient presents with  . Diabetes    3 month follow up  . Hypertension  . Gastroesophageal Reflux    HPI   DM II : He only checks glucose occasionally , not able to check glucose at home, but at work it has been 130's-160's His past hgbA1C  9.8% to 7.9%, up to 9.1% , 9.6% down to 7.8 %, while at [facility] up to 8.3% and today is 8.3% again . He is on Trulicity 2.9HB he is also taking Xigduo 08/998 mg two daily. He has gained weight since last visit, he has a history of heart disease, we will try switching to Ozempic, he refuses to go on long acting insulin.   He has noticed polydipsia, polyuria and  polyphagia. He eats out for lunch almost every day, he is eating some pasta again, and bought candy this past week He is drinking sugar free drinks.  HTN/CAD: no chest pain, no palpitation, bp is at goal, no side effects of medication. He denies decrease in exercise tolerance. He is on Plavix, statin and Zetia.   GERD: he is off Omeprazole, he is off energy drinks, he still has heartburn and taking Ranitidine twice daily, he has intermittent heartburn, denies abdominal pain.   ED: he has difficulty maintaining and erection, uses Cialis prn, he is doing well on Sildenafil  OSA: he has not been compliant with CPAP machine, explained he needs to resume it  Back spasms: he is doing well now, he states Baclofen stopped working, but was given Skelaxin and is doing well now. He takes medication prn  Obesity: he is gaining weight again. He states he was off for 4 days and has been eating candy.  Patient Active Problem List   Diagnosis Date Noted  . OSA (obstructive sleep apnea) 05/21/2015  . Perennial allergic rhinitis 11/07/2014  . Arteriosclerosis of coronary artery 11/07/2014  . CD (contact dermatitis) 11/07/2014  . Decreased libido  11/07/2014  . Diabetes mellitus with renal manifestation ([place]) 11/07/2014  . Gastro-esophageal reflux disease without esophagitis 11/07/2014  . H/O acute myocardial infarction 11/07/2014  . Hypercholesteremia 11/07/2014  . Benign hypertension 11/07/2014  . Hypertriglyceridemia 11/07/2014  . H/O high risk medication treatment 11/07/2014  . NASH (nonalcoholic steatohepatitis) 11/07/2014  . Microalbuminuria 11/07/2014  . Adult BMI 30+ 11/07/2014  . Fungal infection of toenail 11/07/2014  . Tobacco abuse 11/07/2014  . ED (erectile dysfunction) of organic origin 09/29/2006    Past Surgical History:  Procedure Laterality Date  . cadiac stenting    . CYSTECTOMY     56 years of age  . KNEE ARTHROSCOPY      Family History  Problem Relation Age of Onset  . Diabetes Mother   . CAD Mother   . Heart disease Mother   . Heart attack Mother   . Heart disease Father   . Heart attack Father     Social History   Social History  . Marital status: Married    Spouse name: N/A  . Number of children: N/A  . Years of education: N/A   Occupational History  . Not on file.   Social History Main Topics  . Smoking status: Former Smoker    Packs/day: 1.50    Years: 30.00    Types: Cigarettes    Quit  date: 04/19/2013  . Smokeless tobacco: Current User  . Alcohol use 0.0 oz/week     Comment: occ  . Drug use: No  . Sexual activity: Yes   Other Topics Concern  . Not on file   Social History Narrative  . No narrative on file     Current Outpatient Prescriptions:  .  atorvastatin (LIPITOR) 80 MG tablet, Take 1 tablet (80 mg total) by mouth daily at 6 PM., Disp: 90 tablet, Rfl: 1 .  clonazePAM (KLONOPIN) 0.5 MG tablet, Take 1 tablet (0.5 mg total) by mouth 2 (two) times daily as needed for anxiety. Before flights, Disp: 6 tablet, Rfl: 0 .  clopidogrel (PLAVIX) 75 MG tablet, Take 1 tablet (75 mg total) by mouth daily., Disp: 90 tablet, Rfl: 1 .  ezetimibe (ZETIA) 10 MG tablet, Take 1  tablet (10 mg total) by mouth daily., Disp: 30 tablet, Rfl: 11 .  fluticasone (FLONASE) 50 MCG/ACT nasal spray, Place 2 sprays into both nostrils as needed., Disp: 48 g, Rfl: 1 .  Icosapent Ethyl (VASCEPA) 1 g CAPS, Take 2 capsules by mouth 2 (two) times daily., Disp: 360 capsule, Rfl: 1 .  lisinopril (PRINIVIL,ZESTRIL) 10 MG tablet, Take 1 tablet (10 mg total) by mouth daily., Disp: 90 tablet, Rfl: 1 .  loratadine (CLARITIN) 10 MG tablet, Take 1 tablet by mouth daily., Disp: , Rfl:  .  metaxalone (SKELAXIN) 800 MG tablet, Take 1 tablet (800 mg total) by mouth 3 (three) times daily as needed for muscle spasms., Disp: 30 tablet, Rfl: 0 .  metoprolol succinate (TOPROL-XL) 25 MG 24 hr tablet, Take 1 tablet (25 mg total) by mouth daily., Disp: 90 tablet, Rfl: 1 .  ONE TOUCH ULTRA TEST test strip, USE AS DIRECTED, Disp: 100 each, Rfl: 5 .  ranitidine (ZANTAC) 150 MG tablet, Take 1 tablet (150 mg total) by mouth 2 (two) times daily., Disp: 180 tablet, Rfl: 1 .  sildenafil (REVATIO) 20 MG tablet, TAKE 1 TO 5 TABLETS BY MOUTH AS NEEDED, Disp: 50 tablet, Rfl: 0 .  XIGDUO XR 08-998 MG TB24, Take 1 tablet by mouth 2 (two) times daily., Disp: 60 tablet, Rfl: 5 .  Semaglutide (OZEMPIC) 1 MG/DOSE SOPN, Inject 1 mg into the skin once a week., Disp: 3 mL, Rfl: 2  No Known Allergies   ROS  Constitutional: Negative for fever, positive for  weight change.  Respiratory: Negative for cough and shortness of breath.   Cardiovascular: Negative for chest pain or palpitations.  Gastrointestinal: Negative for abdominal pain, no bowel changes.  Musculoskeletal: Negative for gait problem or joint swelling.  Skin: Negative for rash.  Neurological: Negative for dizziness or headache.  No other specific complaints in a complete review of systems (except as listed in HPI above).  Objective  Vitals:   12/22/16 1332  BP: 122/68  Pulse: 84  Resp: 16  Temp: 98.4 F (36.9 C)  SpO2: 96%  Weight: 222 lb 4 oz (100.8 kg)   Height: 5' 8"  (1.727 m)    Body mass index is 33.79 kg/m.  Physical Exam  Constitutional: Patient appears well-developed and well-nourished. Obese No distress.  HEENT: head atraumatic, normocephalic, pupils equal and reactive to light,  neck supple, throat within normal limits Cardiovascular: Normal rate, regular rhythm and normal heart sounds.  No murmur heard. No BLE edema. Pulmonary/Chest: Effort normal and breath sounds normal. No respiratory distress. Abdominal: Soft.  There is no tenderness. Psychiatric: Patient has a normal mood and affect. behavior is normal.  Judgment and thought content normal.   Recent Results (from the past 2160 hour(s))  POCT Glucose (CBG)     Status: Abnormal   Collection Time: 12/22/16  1:34 PM  Result Value Ref Range   POC Glucose 133 (A) 70 - 99 mg/dl  POCT HgB A1C     Status: Abnormal   Collection Time: 12/22/16  1:36 PM  Result Value Ref Range   Hemoglobin A1C 8.3     Diabetic Foot Exam: Diabetic Foot Exam - Simple   Simple Foot Form Diabetic Foot exam was performed with the following findings:  Yes 12/22/2016  2:05 PM  Visual Inspection No deformities, no ulcerations, no other skin breakdown bilaterally:  Yes Sensation Testing Intact to touch and monofilament testing bilaterally:  Yes Pulse Check Posterior Tibialis and Dorsalis pulse intact bilaterally:  Yes Comments Birth mark on second toe of left foot      PHQ2/9: Depression screen [facility] 2/9 07/30/2016 05/26/2016 11/16/2015 08/17/2015 07/04/2015  Decreased Interest 0 0 0 0 0  Down, Depressed, Hopeless 0 0 0 0 0  PHQ - 2 Score 0 0 0 0 0     Fall Risk: Fall Risk  07/30/2016 05/26/2016 11/16/2015 08/17/2015 07/04/2015  Falls in the past year? No No No No No     Assessment & Plan  1. Type 2 diabetes mellitus with diabetic nephropathy, without long-term current use of insulin (HCC)  - POCT HgB A1C - 8.3%, it has not been at goal for a while, explained importance of compliance with  diet - POCT Glucose (CBG) - XIGDUO XR 08-998 MG TB24; Take 1 tablet by mouth 2 (two) times daily.  Dispense: 60 tablet; Refill: 5 - lisinopril (PRINIVIL,ZESTRIL) 10 MG tablet; Take 1 tablet (10 mg total) by mouth daily.  Dispense: 90 tablet; Refill: 1 - Semaglutide (OZEMPIC) 1 MG/DOSE SOPN; Inject 1 mg into the skin once a week.  Dispense: 3 mL; Refill: 2  2. Dyslipidemia associated with type 2 diabetes mellitus (HCC)  - XIGDUO XR 08-998 MG TB24; Take 1 tablet by mouth 2 (two) times daily.  Dispense: 60 tablet; Refill: 5 - Icosapent Ethyl (VASCEPA) 1 g CAPS; Take 2 capsules by mouth 2 (two) times daily.  Dispense: 360 capsule; Refill: 1 - Semaglutide (OZEMPIC) 1 MG/DOSE SOPN; Inject 1 mg into the skin once a week.  Dispense: 3 mL; Refill: 2  3. Gastro-esophageal reflux disease without esophagitis  Taking medication twice daily  4. Benign hypertension  - metoprolol succinate (TOPROL-XL) 25 MG 24 hr tablet; Take 1 tablet (25 mg total) by mouth daily.  Dispense: 90 tablet; Refill: 1 - lisinopril (PRINIVIL,ZESTRIL) 10 MG tablet; Take 1 tablet (10 mg total) by mouth daily.  Dispense: 90 tablet; Refill: 1  5. NASH (nonalcoholic steatohepatitis)  Needs to resume diet  6. OSA (obstructive sleep apnea)  He cannot tolerate CPAP   7. Arteriosclerosis of coronary artery  - metoprolol succinate (TOPROL-XL) 25 MG 24 hr tablet; Take 1 tablet (25 mg total) by mouth daily.  Dispense: 90 tablet; Refill: 1 - clopidogrel (PLAVIX) 75 MG tablet; Take 1 tablet (75 mg total) by mouth daily.  Dispense: 90 tablet; Refill: 1  8. H/O acute myocardial infarction  Sees Dr. Rockey Situ   9. Non-compliant patient  He has not been following a diabetic diet  10. Hypercholesteremia  - atorvastatin (LIPITOR) 80 MG tablet; Take 1 tablet (80 mg total) by mouth daily at 6 PM.  Dispense: 90 tablet; Refill: 1  11. Hypertriglyceridemia  -  Icosapent Ethyl (VASCEPA) 1 g CAPS; Take 2 capsules by mouth 2 (two) times  daily.  Dispense: 360 capsule; Refill: 1

## 2017-01-07 ENCOUNTER — Other Ambulatory Visit: Payer: Self-pay | Admitting: Family Medicine

## 2017-01-07 DIAGNOSIS — E1121 Type 2 diabetes mellitus with diabetic nephropathy: Secondary | ICD-10-CM

## 2017-01-07 NOTE — Telephone Encounter (Signed)
Patient [facility]

## 2017-01-10 ENCOUNTER — Other Ambulatory Visit: Payer: Self-pay | Admitting: Family Medicine

## 2017-01-10 DIAGNOSIS — I2510 Atherosclerotic heart disease of native coronary artery without angina pectoris: Secondary | ICD-10-CM

## 2017-01-10 DIAGNOSIS — I10 Essential (primary) hypertension: Secondary | ICD-10-CM

## 2017-01-20 ENCOUNTER — Telehealth: Payer: Self-pay | Admitting: Family Medicine

## 2017-01-20 ENCOUNTER — Other Ambulatory Visit: Payer: Self-pay | Admitting: Family Medicine

## 2017-01-20 NOTE — Telephone Encounter (Signed)
Dr had changed patient from trulicity and put him on ozempic. Insurance filled it once but will not cover it any longer. Requesting to go back on trulicity. He does have one pen left of the ozempic and will finish it up in 2 weeks. Please send new script of [email or web address]

## 2017-01-21 ENCOUNTER — Other Ambulatory Visit: Payer: Self-pay

## 2017-01-21 MED ORDER — DULAGLUTIDE 1.5 MG/0.5ML ~~LOC~~ SOAJ: SUBCUTANEOUS | 1 refills | Status: DC

## 2017-01-21 NOTE — Telephone Encounter (Signed)
[facility]ill request for general medication: [place] [facility]   Last office visit:12/22/2016  Next visit is 03/27/2017.

## 2017-01-21 NOTE — Telephone Encounter (Signed)
Refill request for diabetic medication:  Trulicity  Last office visit pertaining to diabetes: 12/22/2016    Lab Results  Component Value Date   HGBA1C 8.3 12/22/2016    No follow up scheduled.  Insurance will no longer pay for Ozempic, he want to switch back to Trulicity.

## 2017-01-23 ENCOUNTER — Other Ambulatory Visit: Payer: Self-pay | Admitting: Family Medicine

## 2017-01-23 NOTE — Telephone Encounter (Signed)
Sent April 17th

## 2017-02-04 ENCOUNTER — Telehealth: Payer: Self-pay | Admitting: Family Medicine

## 2017-02-04 NOTE — Telephone Encounter (Signed)
Copied from [place] [id number]. Topic: Quick Communication - See Telephone Encounter >> Feb 04, 2017  1:50 PM Vernona Rieger wrote: CRM for notification. See Telephone encounter for:  02/04/17. Patient said he needs the sticky thing that goes on his arm to read his glucose. He said he went to the pharmacy yesterday and they gave you the meter. He said he doesn't need the meter he needs. Please give him a call back. I am a little confused about what he is talking about.

## 2017-02-05 MED ORDER — FREESTYLE LIBRE READER DEVI: 2.0000 | 5 refills | Status: DC

## 2017-02-09 ENCOUNTER — Other Ambulatory Visit: Payer: Self-pay | Admitting: Family Medicine

## 2017-02-09 DIAGNOSIS — E1121 Type 2 diabetes mellitus with diabetic nephropathy: Secondary | ICD-10-CM

## 2017-02-09 DIAGNOSIS — E785 Hyperlipidemia, unspecified: Secondary | ICD-10-CM

## 2017-02-09 DIAGNOSIS — E1169 Type 2 diabetes mellitus with other specified complication: Secondary | ICD-10-CM

## 2017-02-09 MED ORDER — FREESTYLE LIBRE READER DEVI: 2.0000 | 5 refills | Status: DC

## 2017-02-09 NOTE — Telephone Encounter (Signed)
Refill request for diabetic medication:  Libre sensor  Last office visit pertaining to diabetes: 12/22/2016   Lab Results  Component Value Date   HGBA1C 8.3 12/22/2016   Follow up Visit: 03/27/2017

## 2017-02-09 NOTE — Telephone Encounter (Signed)
Copied from [place] [id number]. Topic: Quick Communication - See Telephone Encounter >> Feb 09, 2017  3:29 PM Conception Chancy, NT wrote: CRM for notification. See Telephone encounter for:   Pt needs RX refill for Trulicity and also the patch that goes on his arm to check his blood sugar. Sent in RX for scan but does not need that one. He needs the patches. Please contact pt when RX is sent over.  02/09/17.

## 2017-02-09 NOTE — Telephone Encounter (Signed)
Refill request for diabetic medication:   Freestyle Libre Reader and Trulicity   Last office visit pertaining to diabetes:  12/22/2016   Lab Results  Component Value Date   HGBA1C 8.3 12/22/2016   Last physical exam: None indicated  Follow up visit: 03/27/2017

## 2017-02-12 NOTE — Telephone Encounter (Signed)
Patient is still waiting on his sensors to be called in for his Elenor Legato. He was also waiting on his Trulicity to be refilled. It had been refilled but sent to the wrong pharmacy. Patient will call pharmacy and have [facility] [place]. He will call back to let us know if he has sensors at pharmacy as well.

## 2017-02-12 NOTE — Telephone Encounter (Signed)
Patient now has his reader and sensor and his Trulicity.

## 2017-02-16 LAB — HM DIABETES EYE EXAM

## 2017-02-17 ENCOUNTER — Encounter: Payer: Self-pay | Admitting: Family Medicine

## 2017-03-08 ENCOUNTER — Other Ambulatory Visit: Payer: Self-pay

## 2017-03-08 ENCOUNTER — Ambulatory Visit
Admission: EM | Admit: 2017-03-08 | Discharge: 2017-03-08 | Disposition: A | Discharge status: Home / Self Care | Payer: Managed Care, Other (non HMO) | Attending: Family Medicine | Admitting: Family Medicine

## 2017-03-08 DIAGNOSIS — K0889 Other specified disorders of teeth and supporting structures: Secondary | ICD-10-CM

## 2017-03-08 MED ORDER — TRAMADOL HCL 50 MG PO TABS: 50.0000 mg | ORAL_TABLET | Freq: Three times a day (TID) | ORAL | 0 refills | Status: DC | PRN

## 2017-03-08 MED ORDER — AMOXICILLIN-POT CLAVULANATE 875-125 MG PO TABS: 1.0000 | ORAL_TABLET | Freq: Two times a day (BID) | ORAL | 0 refills | Status: DC

## 2017-03-08 NOTE — ED Triage Notes (Signed)
Patient c/o dental pain on left side since Friday. Patient states it started Friday after eating fried chicken, he did notice his dental filling fall out a while back.Patient has taken ibuprofen, aleve and an OTC dental pain medication with slight relief.

## 2017-03-08 NOTE — Discharge Instructions (Signed)
Call a dentist.  Medication as prescribed.  Take care  Dr. Lacinda Axon

## 2017-03-08 NOTE — ED Provider Notes (Signed)
[facility]    CSN: [id number] Arrival date & time: 03/08/17  7782  History   Chief Complaint Chief Complaint  Patient presents with  . Dental Pain   HPI  56 year old male presents with a dental problem.  Patient reports that recently he had a filling fall out.  Since Friday he has had dental pain of this particular tooth.  It is his upper left molar.  He states that on Friday he was eating fried chicken and feels like this incited the pain.  He reports severe pain of this tooth.  Seems to be worsening.  He states he is taking ibuprofen, Aleve without improvement.  Worse with pressure/touch as well as eating.  No relieving factors.  No reports of fevers or chills.  No other reported symptoms.  No other complaints.  Past Medical History:  Diagnosis Date  . Allergy   . Anxiety   . COPD (chronic obstructive pulmonary disease) ([place])   . Diabetes mellitus without complication ([place])   . Hyperlipidemia   . Hypertension   . Male impotence   . MI (myocardial infarction) ([place])   . Obesity   . PNA (pneumonia)     Patient Active Problem List   Diagnosis Date Noted  . OSA (obstructive sleep apnea) 05/21/2015  . Perennial allergic rhinitis 11/07/2014  . Arteriosclerosis of coronary artery 11/07/2014  . CD (contact dermatitis) 11/07/2014  . Decreased libido 11/07/2014  . Diabetes mellitus with renal manifestation ([place]) 11/07/2014  . Gastro-esophageal reflux disease without esophagitis 11/07/2014  . H/O acute myocardial infarction 11/07/2014  . Hypercholesteremia 11/07/2014  . Benign hypertension 11/07/2014  . Hypertriglyceridemia 11/07/2014  . H/O high risk medication treatment 11/07/2014  . NASH (nonalcoholic steatohepatitis) 11/07/2014  . Microalbuminuria 11/07/2014  . Adult BMI 30+ 11/07/2014  . Fungal infection of toenail 11/07/2014  . Tobacco abuse 11/07/2014  . ED (erectile dysfunction) of organic origin 09/29/2006    Past Surgical History:  Procedure  Laterality Date  . cadiac stenting    . CYSTECTOMY     56 years of age  . KNEE ARTHROSCOPY         Home Medications    Prior to Admission medications   Medication Sig Start Date End Date Taking? Authorizing Provider  atorvastatin (LIPITOR) 80 MG tablet Take 1 tablet (80 mg total) by mouth daily at 6 PM. 12/22/16  Yes Sowles, Drue Stager, MD  clonazePAM (KLONOPIN) 0.5 MG tablet Take 1 tablet (0.5 mg total) by mouth 2 (two) times daily as needed for anxiety. Before flights 09/16/16  Yes Sowles, Drue Stager, MD  clopidogrel (PLAVIX) 75 MG tablet Take 1 tablet (75 mg total) by mouth daily. 12/22/16  Yes Steele Sizer, MD  Continuous Blood Gluc Receiver (FREESTYLE LIBRE READER) DEVI Apply 2 each once a week topically. 02/09/17  Yes Steele Sizer, MD  Continuous Blood Gluc Sensor ([place]) MISC 1 EACH BY DOES NOT APPLY ROUTE 3 (THREE) TIMES DAILY. 02/09/17  Yes Sowles, Drue Stager, MD  Dulaglutide (TRULICITY) 1.5 UM/3.5TI SOPN INJECT 1.5 MG INTO THE SKIN ONCE WEEKLY 01/21/17  Yes Sowles, Drue Stager, MD  ezetimibe (ZETIA) 10 MG tablet Take 1 tablet (10 mg total) by mouth daily. 08/15/16  Yes Gollan, Kathlene November, MD  fluticasone (FLONASE) 50 MCG/ACT nasal spray Place 2 sprays into both nostrils as needed. 11/10/14  Yes Sowles, Drue Stager, MD  Icosapent Ethyl (VASCEPA) 1 g CAPS Take 2 capsules by mouth 2 (two) times daily. 12/22/16  Yes Sowles, Drue Stager, MD  lisinopril (PRINIVIL,ZESTRIL) 10  MG tablet Take 1 tablet (10 mg total) by mouth daily. 12/22/16  Yes Sowles, Drue Stager, MD  loratadine (CLARITIN) 10 MG tablet Take 1 tablet by mouth daily. 08/09/12  Yes [provider]  metaxalone (SKELAXIN) 800 MG tablet Take 1 tablet (800 mg total) by mouth 3 (three) times daily as needed for muscle spasms. 09/18/16  Yes Hubbard Hartshorn, FNP  metoprolol succinate (TOPROL-XL) 25 MG 24 hr tablet Take 1 tablet (25 mg total) by mouth daily. 12/22/16  Yes Sowles, Drue Stager, MD  ONE TOUCH ULTRA TEST test strip USE AS  DIRECTED 09/28/16  Yes Sowles, Drue Stager, MD  ranitidine (ZANTAC) 150 MG tablet Take 1 tablet (150 mg total) by mouth 2 (two) times daily. 09/16/16  Yes Sowles, Drue Stager, MD  Semaglutide (OZEMPIC) 1 MG/DOSE SOPN Inject 1 mg into the skin once a week. 12/22/16  Yes Sowles, Drue Stager, MD  sildenafil (REVATIO) 20 MG tablet TAKE 1 TO 5 TABLETS BY MOUTH AS NEEDED 11/17/16  Yes Sowles, Drue Stager, MD  XIGDUO XR 08-998 MG TB24 Take 1 tablet by mouth 2 (two) times daily. 12/22/16  Yes Sowles, Drue Stager, MD  amoxicillin-clavulanate (AUGMENTIN) 875-125 MG tablet Take 1 tablet by mouth every 12 (twelve) hours. 03/08/17   Coral Spikes, DO  traMADol (ULTRAM) 50 MG tablet Take 1 tablet (50 mg total) by mouth every 8 (eight) hours as needed. 03/08/17   Coral Spikes, DO    Family History Family History  Problem Relation Age of Onset  . Diabetes Mother   . CAD Mother   . Heart disease Mother   . Heart attack Mother   . Heart disease Father   . Heart attack Father     Social History Social History   Tobacco Use  . Smoking status: Former Smoker    Packs/day: 1.50    Years: 30.00    Pack years: 45.00    Types: Cigarettes    Last attempt to quit: 04/19/2013    Years since quitting: 3.8  . Smokeless tobacco: Current User  Substance Use Topics  . Alcohol use: No    Alcohol/week: 0.0 oz    Frequency: Never    Comment: occ  . Drug use: No     Allergies   Patient has no known allergies.   Review of Systems Review of Systems  Constitutional: Negative.   HENT: Positive for dental problem.        Facial pain.   Physical Exam Triage Vital Signs ED Triage Vitals  Enc Vitals Group     BP 03/08/17 1023 (!) 121/59     Pulse Rate 03/08/17 1023 86     Resp --      Temp 03/08/17 1023 98.2 F (36.8 C)     Temp Source 03/08/17 1023 Oral     SpO2 03/08/17 1023 97 %     Weight 03/08/17 1024 215 lb (97.5 kg)     Height 03/08/17 1024 5' 8"  (1.727 m)     Head Circumference --      Peak Flow --      Pain Score  03/08/17 1026 10     Pain Loc --      Pain Edu? --      Excl. in [place]? --    Updated Vital Signs BP (!) 121/59 (BP Location: Left Arm)   Pulse 86   Temp 98.2 F (36.8 C) (Oral)   Ht 5' 8"  (1.727 m)   Wt 215 lb (97.5 kg)   SpO2 97%  BMI 32.69 kg/m     Physical Exam  Constitutional: He is oriented to person, place, and time. He appears well-developed. No distress.  HENT:  Head: Normocephalic and atraumatic.  Nose: Nose normal.  Mouth/Throat:    Broken tooth noted.  Poor dentition.  Tenderness to palpation.  Eyes: Conjunctivae are normal. Right eye exhibits no discharge. Left eye exhibits no discharge.  Neck: Neck supple.  Cardiovascular: Normal rate and regular rhythm.  Pulmonary/Chest: Effort normal and breath sounds normal. He has no wheezes. He has no rales.  Lymphadenopathy:    He has no cervical adenopathy.  Neurological: He is alert and oriented to person, place, and time.  Skin: Skin is warm. No rash noted.  Psychiatric: He has a normal mood and affect. His behavior is normal.  Vitals reviewed.  UC Treatments / Results  Labs (all labs ordered are listed, but only abnormal results are displayed) Labs Reviewed - No data to display  EKG  EKG Interpretation None       Radiology No results found.  Procedures Procedures (including critical care time)  Medications Ordered in UC Medications - No data to display   Initial Impression / Assessment and Plan / UC Course  I have reviewed the triage vital signs and the nursing notes.  Pertinent labs & imaging results that were available during my care of the patient were reviewed by me and considered in my medical decision making (see chart for details).     56 year old male presents with dental pain.  Placing patient on Augmentin to cover for potential underlying infection.  Tramadol for pain.  Advised to see dentist.  Final Clinical Impressions(s) / UC Diagnoses   Final diagnoses:  Pain, dental    ED  Discharge Orders        Ordered    amoxicillin-clavulanate (AUGMENTIN) 875-125 MG tablet  Every 12 hours     03/08/17 1053    traMADol (ULTRAM) 50 MG tablet  Every 8 hours PRN     03/08/17 1053     Controlled Substance Prescriptions [place] Controlled Substance Registry consulted? Not Applicable   Coral Spikes, DO 03/08/17 1059

## 2017-03-22 ENCOUNTER — Other Ambulatory Visit: Payer: Self-pay | Admitting: Family Medicine

## 2017-03-22 DIAGNOSIS — N529 Male erectile dysfunction, unspecified: Secondary | ICD-10-CM

## 2017-03-27 ENCOUNTER — Ambulatory Visit: Payer: Managed Care, Other (non HMO) | Admitting: Family Medicine

## 2017-03-27 ENCOUNTER — Other Ambulatory Visit: Payer: Self-pay | Admitting: Family Medicine

## 2017-03-27 DIAGNOSIS — E785 Hyperlipidemia, unspecified: Secondary | ICD-10-CM

## 2017-03-27 DIAGNOSIS — E1121 Type 2 diabetes mellitus with diabetic nephropathy: Secondary | ICD-10-CM

## 2017-03-27 DIAGNOSIS — E1169 Type 2 diabetes mellitus with other specified complication: Secondary | ICD-10-CM

## 2017-03-30 NOTE — Telephone Encounter (Signed)
[facility] request for general medication[facility]  Last office visit: 12/22/2016  Follow up on: 05/12/2017

## 2017-04-14 ENCOUNTER — Other Ambulatory Visit: Payer: Self-pay | Admitting: Family Medicine

## 2017-04-14 DIAGNOSIS — E1169 Type 2 diabetes mellitus with other specified complication: Secondary | ICD-10-CM

## 2017-04-14 DIAGNOSIS — E785 Hyperlipidemia, unspecified: Secondary | ICD-10-CM

## 2017-04-14 DIAGNOSIS — E1121 Type 2 diabetes mellitus with diabetic nephropathy: Secondary | ICD-10-CM

## 2017-04-15 ENCOUNTER — Other Ambulatory Visit: Payer: Self-pay | Admitting: Family Medicine

## 2017-04-15 NOTE — Telephone Encounter (Signed)
[facility] diabetes [facility]  Last office visit pertaining to diabetes:  Lab Results  Component Value Date   HGBA1C 8.3 12/22/2016     follow up 05/12/2017

## 2017-04-16 ENCOUNTER — Other Ambulatory Visit: Payer: Self-pay | Admitting: Family Medicine

## 2017-04-16 DIAGNOSIS — K219 Gastro-esophageal reflux disease without esophagitis: Secondary | ICD-10-CM

## 2017-05-12 ENCOUNTER — Encounter: Payer: Self-pay | Admitting: Family Medicine

## 2017-05-12 ENCOUNTER — Ambulatory Visit: Payer: Managed Care, Other (non HMO) | Admitting: Family Medicine

## 2017-05-12 VITALS — BP 120/78 | HR 86 | Resp 18 | Ht 68.0 in | Wt 216.7 lb

## 2017-05-12 DIAGNOSIS — I10 Essential (primary) hypertension: Secondary | ICD-10-CM | POA: Diagnosis not present

## 2017-05-12 DIAGNOSIS — E1169 Type 2 diabetes mellitus with other specified complication: Secondary | ICD-10-CM

## 2017-05-12 DIAGNOSIS — K219 Gastro-esophageal reflux disease without esophagitis: Secondary | ICD-10-CM | POA: Diagnosis not present

## 2017-05-12 DIAGNOSIS — E781 Pure hyperglyceridemia: Secondary | ICD-10-CM

## 2017-05-12 DIAGNOSIS — Z23 Encounter for immunization: Secondary | ICD-10-CM | POA: Diagnosis not present

## 2017-05-12 DIAGNOSIS — E1121 Type 2 diabetes mellitus with diabetic nephropathy: Secondary | ICD-10-CM

## 2017-05-12 DIAGNOSIS — G4733 Obstructive sleep apnea (adult) (pediatric): Secondary | ICD-10-CM | POA: Diagnosis not present

## 2017-05-12 DIAGNOSIS — K7581 Nonalcoholic steatohepatitis (NASH): Secondary | ICD-10-CM

## 2017-05-12 DIAGNOSIS — N529 Male erectile dysfunction, unspecified: Secondary | ICD-10-CM | POA: Diagnosis not present

## 2017-05-12 DIAGNOSIS — I252 Old myocardial infarction: Secondary | ICD-10-CM | POA: Diagnosis not present

## 2017-05-12 DIAGNOSIS — I2510 Atherosclerotic heart disease of native coronary artery without angina pectoris: Secondary | ICD-10-CM

## 2017-05-12 DIAGNOSIS — E785 Hyperlipidemia, unspecified: Secondary | ICD-10-CM | POA: Diagnosis not present

## 2017-05-12 LAB — COMPLETE METABOLIC PANEL WITH GFR
AG Ratio: 2 (calc) (ref 1.0–2.5)
ALBUMIN MSPROF: 4.9 g/dL (ref 3.6–5.1)
ALT: 29 U/L (ref 9–46)
AST: 17 U/L (ref 10–35)
Alkaline phosphatase (APISO): 67 U/L (ref 40–115)
BILIRUBIN TOTAL: 1.6 mg/dL — AB (ref 0.2–1.2)
BUN: 19 mg/dL (ref 7–25)
CHLORIDE: 102 mmol/L (ref 98–110)
CO2: 26 mmol/L (ref 20–32)
CREATININE: 0.83 mg/dL (ref 0.70–1.33)
Calcium: 10 mg/dL (ref 8.6–10.3)
GFR, Est African American: 116 mL/min/{1.73_m2} (ref >=60)
GFR, Est Non African American: 100 mL/min/{1.73_m2} (ref >=60)
GLOBULIN: 2.5 g/dL (ref 1.9–3.7)
GLUCOSE: 143 mg/dL — AB (ref 65–139)
Potassium: 4.5 mmol/L (ref 3.5–5.3)
SODIUM: 136 mmol/L (ref 135–146)
TOTAL PROTEIN: 7.4 g/dL (ref 6.1–8.1)

## 2017-05-12 LAB — POCT GLYCOSYLATED HEMOGLOBIN (HGB A1C): HEMOGLOBIN A1C: 8.2

## 2017-05-12 LAB — CBC WITH DIFFERENTIAL/PLATELET
BASOS PCT: 0.8 %
Basophils Absolute: 61 cells/uL (ref 0–200)
Eosinophils Absolute: 91 cells/uL (ref 15–500)
Eosinophils Relative: 1.2 %
HCT: 45.6 % (ref 38.5–50.0)
Hemoglobin: 15.9 g/dL (ref 13.2–17.1)
Lymphs Abs: 2022 cells/uL (ref 850–3900)
MCH: 31.4 pg (ref 27.0–33.0)
MCHC: 34.9 g/dL (ref 32.0–36.0)
MCV: 90.1 fL (ref 80.0–100.0)
MONOS PCT: 9.9 %
MPV: 10.6 fL (ref 7.5–12.5)
NEUTROS ABS: 4674 {cells}/uL (ref 1500–7800)
Neutrophils Relative %: 61.5 %
PLATELETS: 295 10*3/uL (ref 140–400)
RBC: 5.06 10*6/uL (ref 4.20–5.80)
RDW: 11.8 % (ref 11.0–15.0)
TOTAL LYMPHOCYTE: 26.6 %
WBC mixed population: 752 cells/uL (ref 200–950)
WBC: 7.6 10*3/uL (ref 3.8–10.8)

## 2017-05-12 LAB — LIPID PANEL
Cholesterol: 93 mg/dL (ref <200)
HDL: 25 mg/dL — ABNORMAL LOW (ref >40)
LDL CHOLESTEROL (CALC): 41 mg/dL
Non-HDL Cholesterol (Calc): 68 mg/dL (calc) (ref <130)
Total CHOL/HDL Ratio: 3.7 (calc) (ref <5.0)
Triglycerides: 206 mg/dL — ABNORMAL HIGH (ref <150)

## 2017-05-12 LAB — GLUCOSE, POCT (MANUAL RESULT ENTRY): POC GLUCOSE: 144 mg/dL — AB (ref 70–99)

## 2017-05-12 MED ORDER — LISINOPRIL 10 MG PO TABS: 10.0000 mg | ORAL_TABLET | Freq: Every day | ORAL | 1 refills | Status: DC

## 2017-05-12 MED ORDER — INSULIN DEGLUDEC-LIRAGLUTIDE 100-3.6 UNIT-MG/ML ~~LOC~~ SOPN: 16.0000 [IU] | PEN_INJECTOR | Freq: Every day | SUBCUTANEOUS | 2 refills | Status: DC

## 2017-05-12 MED ORDER — CLOPIDOGREL BISULFATE 75 MG PO TABS: 75.0000 mg | ORAL_TABLET | Freq: Every day | ORAL | 1 refills | Status: DC

## 2017-05-12 MED ORDER — DAPAGLIFLOZIN PRO-METFORMIN ER 5-1000 MG PO TB24: 1.0000 | ORAL_TABLET | Freq: Two times a day (BID) | ORAL | 5 refills | Status: DC

## 2017-05-12 MED ORDER — ICOSAPENT ETHYL 1 G PO CAPS: 2.0000 | ORAL_CAPSULE | Freq: Two times a day (BID) | ORAL | 1 refills | Status: DC

## 2017-05-12 MED ORDER — FREESTYLE LIBRE 14 DAY SENSOR MISC: 1.0000 | 5 refills | Status: DC

## 2017-05-12 MED ORDER — METOPROLOL SUCCINATE ER 25 MG PO TB24: 25.0000 mg | ORAL_TABLET | Freq: Every day | ORAL | 1 refills | Status: DC

## 2017-05-12 NOTE — Patient Instructions (Addendum)
Start Xultophy at 16 units every day, check sugar if fasting is above 140 you can increase dose by 2 units, to a max of 50 units per day   Go up by 2 units every 3 days if glucose not below 140 in the morning.   Stop taking Aleve, only allowed to take Tylenol

## 2017-05-12 NOTE — Progress Notes (Signed)
Name: Jesse Lawrence.   MRN: [id number]    DOB: 03/31/1961   Date:05/12/2017       Progress Note  Subjective  Chief Complaint  Chief Complaint  Patient presents with  . Follow-up    HPI   DM II : His past hgbA1C  9.8% to 7.9%, up to 9.1% , 9.6% down to 7.8 %, while at [facility] up to 8.3% ,8.3% and today it was 8.2% - he brought his monitor and fasting is around 160's , post-prandial 850'Y-774'J  He is on Trulicity 2.8NO he is also taking Xigduo 08/998 mg two daily. He has lost weight since last visit,  he has a history of heart disease, we tried switching to Ozempic on his last visit but it was not covered by insurance, he is willing to try Xultophy today to get glucose at goal, however our glucose did not match his glucose from his monitor. He has changed his diet, he denies polyphagia, polyuria but he has episodes of  polydipsia. No signs of hypoglycemia  HTN/CAD: no chest pain, no palpitation, bp is at goal, no side effects of medication. He denies decrease in exercise tolerance. He is on Plavix, statin and Zetia.  He never filled Vascepa, we will give him a voucher today   GERD: he is off Omeprazole, he is off energy drinks, he still has heartburn and taking Ranitidine twice daily, he has intermittent heartburn, denies abdominal pain.   ED: he has difficulty maintaining and erection, uses Cialis prn, he is doing well on Sildenafil  OSA: he has not been compliant with CPAP machine, he still refuses to resume medications  Back spasms: he is doing well now, he states Baclofen stopped working, but was given Skelaxin and is doing well now.   Obesity: he is now eating healthier , losing weight.   Patient Active Problem List   Diagnosis Date Noted  . OSA (obstructive sleep apnea) 05/21/2015  . Perennial allergic rhinitis 11/07/2014  . Arteriosclerosis of coronary artery 11/07/2014  . CD (contact dermatitis) 11/07/2014  . Decreased libido 11/07/2014  . Diabetes mellitus with  renal manifestation ([place]) 11/07/2014  . Gastro-esophageal reflux disease without esophagitis 11/07/2014  . H/O acute myocardial infarction 11/07/2014  . Hypercholesteremia 11/07/2014  . Benign hypertension 11/07/2014  . Hypertriglyceridemia 11/07/2014  . H/O high risk medication treatment 11/07/2014  . NASH (nonalcoholic steatohepatitis) 11/07/2014  . Microalbuminuria 11/07/2014  . Adult BMI 30+ 11/07/2014  . Fungal infection of toenail 11/07/2014  . Tobacco abuse 11/07/2014  . ED (erectile dysfunction) of organic origin 09/29/2006    Past Surgical History:  Procedure Laterality Date  . cadiac stenting    . CYSTECTOMY     57 years of age  . KNEE ARTHROSCOPY      Family History  Problem Relation Age of Onset  . Diabetes Mother   . CAD Mother   . Heart disease Mother   . Heart attack Mother   . Heart disease Father   . Heart attack Father     Social History   Socioeconomic History  . Marital status: Married    Spouse name: Not on file  . Number of children: Not on file  . Years of education: Not on file  . Highest education level: Not on file  Social Needs  . Financial resource strain: Not on file  . Food insecurity - worry: Not on file  . Food insecurity - inability: Not on file  . Transportation needs - medical: Not  on file  . Transportation needs - non-medical: Not on file  Occupational History  . Not on file  Tobacco Use  . Smoking status: Former Smoker    Packs/day: 1.50    Years: 30.00    Pack years: 45.00    Types: Cigarettes    Last attempt to quit: 04/19/2013    Years since quitting: 4.0  . Smokeless tobacco: Current User  Substance and Sexual Activity  . Alcohol use: No    Alcohol/week: 0.0 oz    Frequency: Never    Comment: occ  . Drug use: No  . Sexual activity: Yes  Other Topics Concern  . Not on file  Social History Narrative  . Not on file     Current Outpatient Medications:  .  atorvastatin (LIPITOR) 80 MG tablet, Take 1 tablet (80  mg total) by mouth daily at 6 PM., Disp: 90 tablet, Rfl: 1 .  clonazePAM (KLONOPIN) 0.5 MG tablet, Take 1 tablet (0.5 mg total) by mouth 2 (two) times daily as needed for anxiety. Before flights, Disp: 6 tablet, Rfl: 0 .  clopidogrel (PLAVIX) 75 MG tablet, Take 1 tablet (75 mg total) by mouth daily., Disp: 90 tablet, Rfl: 1 .  Continuous Blood Gluc Receiver (FREESTYLE LIBRE READER) DEVI, Apply 2 each once a week topically., Disp: 2 Device, Rfl: 5 .  Continuous Blood Gluc Sensor (FREESTYLE LIBRE SENSOR SYSTEM) MISC, USE AS DIRECTED, Disp: 3 each, Rfl: 1 .  ezetimibe (ZETIA) 10 MG tablet, Take 1 tablet (10 mg total) by mouth daily., Disp: 30 tablet, Rfl: 11 .  fluticasone (FLONASE) 50 MCG/ACT nasal spray, Place 2 sprays into both nostrils as needed., Disp: 48 g, Rfl: 1 .  Icosapent Ethyl (VASCEPA) 1 g CAPS, Take 2 capsules by mouth 2 (two) times daily., Disp: 360 capsule, Rfl: 1 .  lisinopril (PRINIVIL,ZESTRIL) 10 MG tablet, Take 1 tablet (10 mg total) by mouth daily., Disp: 90 tablet, Rfl: 1 .  loratadine (CLARITIN) 10 MG tablet, Take 1 tablet by mouth daily., Disp: , Rfl:  .  metaxalone (SKELAXIN) 800 MG tablet, Take 1 tablet (800 mg total) by mouth 3 (three) times daily as needed for muscle spasms., Disp: 30 tablet, Rfl: 0 .  metoprolol succinate (TOPROL-XL) 25 MG 24 hr tablet, Take 1 tablet (25 mg total) by mouth daily., Disp: 90 tablet, Rfl: 1 .  ONE TOUCH ULTRA TEST test strip, USE AS DIRECTED, Disp: 100 each, Rfl: 5 .  ranitidine (ZANTAC) 150 MG tablet, TAKE 1 TABLET BY MOUTH TWICE A DAY, Disp: 180 tablet, Rfl: 1 .  sildenafil (REVATIO) 20 MG tablet, TAKE 1 TO 5 TABLETS BY MOUTH AS NEEDED, Disp: 50 tablet, Rfl: 0 .  traMADol (ULTRAM) 50 MG tablet, Take 1 tablet (50 mg total) by mouth every 8 (eight) hours as needed., Disp: 15 tablet, Rfl: 0 .  XIGDUO XR 08-998 MG TB24, TAKE 1 TABLET BY MOUTH 2 (TWO) TIMES DAILY., Disp: 60 tablet, Rfl: 0  No Known Allergies   ROS  Constitutional: Negative  for fever, positive for weight change.  Respiratory: Negative for cough and shortness of breath.   Cardiovascular: Negative for chest pain or palpitations.  Gastrointestinal: Negative for abdominal pain, no bowel changes.  Musculoskeletal: Negative for gait problem or joint swelling.  Skin: Negative for rash.  Neurological: Negative for dizziness or headache.  No other specific complaints in a complete review of systems (except as listed in HPI above).  Objective  Vitals:   05/12/17 0754  BP:  120/78  Pulse: 86  Resp: 18  SpO2: 97%  Weight: 216 lb 11.2 oz (98.3 kg)  Height: 5' 8"  (1.727 m)    Body mass index is 32.95 kg/m.  Physical Exam  Constitutional: Patient appears well-developed and well-nourished. Obese  No distress.  HEENT: head atraumatic, normocephalic, pupils equal and reactive to light, neck supple, throat within normal limits Cardiovascular: Normal rate, regular rhythm and normal heart sounds.  No murmur heard. No BLE edema. Pulmonary/Chest: Effort normal and breath sounds normal. No respiratory distress. Abdominal: Soft.  There is no tenderness. Psychiatric: Patient has a normal mood and affect. behavior is normal. Judgment and thought content normal.    PHQ2/9: Depression screen [facility] 2/9 07/30/2016 05/26/2016 11/16/2015 08/17/2015 07/04/2015  Decreased Interest 0 0 0 0 0  Down, Depressed, Hopeless 0 0 0 0 0  PHQ - 2 Score 0 0 0 0 0     Fall Risk: Fall Risk  07/30/2016 05/26/2016 11/16/2015 08/17/2015 07/04/2015  Falls in the past year? No No No No No     Assessment & Plan  1. Type 2 diabetes mellitus with diabetic nephropathy, without long-term current use of insulin (HCC)  - POCT HgB A1C - POCT Glucose (CBG) - Insulin Degludec-Liraglutide (XULTOPHY) 100-3.6 UNIT-MG/ML SOPN; Inject 16 Units into the skin daily before breakfast. Stop Trulicity Increase by 2 units every 3 days to a max of 50  Dispense: 9 mL; Refill: 2 - Dapagliflozin-Metformin HCl ER (XIGDUO  XR) 08-998 MG TB24; Take 1 tablet by mouth 2 (two) times daily.  Dispense: 60 tablet; Refill: 5 - lisinopril (PRINIVIL,ZESTRIL) 10 MG tablet; Take 1 tablet (10 mg total) by mouth daily.  Dispense: 90 tablet; Refill: 1  2. Dyslipidemia associated with type 2 diabetes mellitus (HCC)  - Insulin Degludec-Liraglutide (XULTOPHY) 100-3.6 UNIT-MG/ML SOPN; Inject 16 Units into the skin daily before breakfast. Stop Trulicity Increase by 2 units every 3 days to a max of 50  Dispense: 9 mL; Refill: 2 - Dapagliflozin-Metformin HCl ER (XIGDUO XR) 08-998 MG TB24; Take 1 tablet by mouth 2 (two) times daily.  Dispense: 60 tablet; Refill: 5 - Icosapent Ethyl (VASCEPA) 1 g CAPS; Take 2 capsules by mouth 2 (two) times daily.  Dispense: 360 capsule; Refill: 1  3. Gastro-esophageal reflux disease without esophagitis  Doing well on Ranitidine  4. OSA (obstructive sleep apnea)   5. NASH (nonalcoholic steatohepatitis)  Doing job by losing weight   6. Benign hypertension  - lisinopril (PRINIVIL,ZESTRIL) 10 MG tablet; Take 1 tablet (10 mg total) by mouth daily.  Dispense: 90 tablet; Refill: 1 - metoprolol succinate (TOPROL-XL) 25 MG 24 hr tablet; Take 1 tablet (25 mg total) by mouth daily.  Dispense: 90 tablet; Refill: 1  7. Arteriosclerosis of coronary artery  - clopidogrel (PLAVIX) 75 MG tablet; Take 1 tablet (75 mg total) by mouth daily.  Dispense: 90 tablet; Refill: 1 - metoprolol succinate (TOPROL-XL) 25 MG 24 hr tablet; Take 1 tablet (25 mg total) by mouth daily.  Dispense: 90 tablet; Refill: 1  8. H/O acute myocardial infarction  Needs to avoid Aleve since he is on Plavix  9. ED (erectile dysfunction) of organic origin  Continue medication prn   10. Hypertriglyceridemia  - Icosapent Ethyl (VASCEPA) 1 g CAPS; Take 2 capsules by mouth 2 (two) times daily.  Dispense: 360 capsule; Refill: 1  He needs to fill rx

## 2017-05-22 ENCOUNTER — Other Ambulatory Visit: Payer: Self-pay | Admitting: Family Medicine

## 2017-05-22 DIAGNOSIS — E1121 Type 2 diabetes mellitus with diabetic nephropathy: Secondary | ICD-10-CM

## 2017-05-22 DIAGNOSIS — E785 Hyperlipidemia, unspecified: Secondary | ICD-10-CM

## 2017-05-22 DIAGNOSIS — E1169 Type 2 diabetes mellitus with other specified complication: Secondary | ICD-10-CM

## 2017-05-22 NOTE — Telephone Encounter (Signed)
Refill request for general medication. Freestyle Libre   Last office visit:  05/12/2017   Follow up on 08/14/2017

## 2017-06-02 ENCOUNTER — Other Ambulatory Visit: Payer: Self-pay | Admitting: Family Medicine

## 2017-06-02 DIAGNOSIS — E1169 Type 2 diabetes mellitus with other specified complication: Secondary | ICD-10-CM

## 2017-06-02 DIAGNOSIS — E1121 Type 2 diabetes mellitus with diabetic nephropathy: Secondary | ICD-10-CM

## 2017-06-02 DIAGNOSIS — E785 Hyperlipidemia, unspecified: Secondary | ICD-10-CM

## 2017-06-02 NOTE — Telephone Encounter (Signed)
Refill request for diabetic medication:   Xigduo XR   Last office visit pertaining to diabetes: 05/12/2017  Lab Results  Component Value Date   HGBA1C 8.2 05/12/2017    Follow-up on file. 08/14/2017

## 2017-06-20 ENCOUNTER — Encounter: Payer: Self-pay | Admitting: Gynecology

## 2017-06-20 ENCOUNTER — Ambulatory Visit
Admission: EM | Admit: 2017-06-20 | Discharge: 2017-06-20 | Disposition: A | Discharge status: Home / Self Care | Payer: Managed Care, Other (non HMO) | Attending: Family Medicine | Admitting: Family Medicine

## 2017-06-20 ENCOUNTER — Other Ambulatory Visit: Payer: Self-pay

## 2017-06-20 DIAGNOSIS — J069 Acute upper respiratory infection, unspecified: Secondary | ICD-10-CM | POA: Diagnosis not present

## 2017-06-20 DIAGNOSIS — B9789 Other viral agents as the cause of diseases classified elsewhere: Secondary | ICD-10-CM | POA: Diagnosis not present

## 2017-06-20 MED ORDER — BENZONATATE 100 MG PO CAPS: 100.0000 mg | ORAL_CAPSULE | Freq: Three times a day (TID) | ORAL | 0 refills | Status: DC | PRN

## 2017-06-20 MED ORDER — HYDROCOD POLST-CPM POLST ER 10-8 MG/5ML PO SUER: 5.0000 mL | Freq: Two times a day (BID) | ORAL | 0 refills | Status: DC | PRN

## 2017-06-20 NOTE — Discharge Instructions (Signed)
Rest.  Flonase.  Tessalon for cough during the day. Tussionex at night.  Take care  Dr. Lacinda Axon

## 2017-06-20 NOTE — ED Provider Notes (Signed)
[facility]    CSN: [id number] Arrival date & time: 06/20/17  5400   History   Chief Complaint Chief Complaint  Patient presents with  . Cough   HPI  57 year old male presents with cough and congestion.  Started on Thursday.  Reports cough and congestion.  Cough is nonproductive.  No fever.  No chills.  No known exacerbating/relieving factors.  No reported sick contacts.  No other associated symptoms.  No other complaints this time.  Past Medical History:  Diagnosis Date  . Allergy   . Anxiety   . COPD (chronic obstructive pulmonary disease) ([place])   . Diabetes mellitus without complication ([place])   . Hyperlipidemia   . Hypertension   . Male impotence   . MI (myocardial infarction) ([place])   . Obesity   . PNA (pneumonia)     Patient Active Problem List   Diagnosis Date Noted  . OSA (obstructive sleep apnea) 05/21/2015  . Perennial allergic rhinitis 11/07/2014  . Arteriosclerosis of coronary artery 11/07/2014  . CD (contact dermatitis) 11/07/2014  . Decreased libido 11/07/2014  . Diabetes mellitus with renal manifestation ([place]) 11/07/2014  . Gastro-esophageal reflux disease without esophagitis 11/07/2014  . H/O acute myocardial infarction 11/07/2014  . Hypercholesteremia 11/07/2014  . Benign hypertension 11/07/2014  . Hypertriglyceridemia 11/07/2014  . H/O high risk medication treatment 11/07/2014  . NASH (nonalcoholic steatohepatitis) 11/07/2014  . Microalbuminuria 11/07/2014  . Adult BMI 30+ 11/07/2014  . Fungal infection of toenail 11/07/2014  . Tobacco abuse 11/07/2014  . ED (erectile dysfunction) of organic origin 09/29/2006    Past Surgical History:  Procedure Laterality Date  . cadiac stenting    . CYSTECTOMY     57 years of age  . KNEE ARTHROSCOPY     Home Medications    Prior to Admission medications   Medication Sig Start Date End Date Taking? Authorizing Provider  atorvastatin (LIPITOR) 80 MG tablet Take 1 tablet (80 mg total) by  mouth daily at 6 PM. 12/22/16  Yes Sowles, Drue Stager, MD  clonazePAM (KLONOPIN) 0.5 MG tablet Take 1 tablet (0.5 mg total) by mouth 2 (two) times daily as needed for anxiety. Before flights 09/16/16  Yes Sowles, Drue Stager, MD  clopidogrel (PLAVIX) 75 MG tablet Take 1 tablet (75 mg total) by mouth daily. 05/12/17  Yes Sowles, Drue Stager, MD  Continuous Blood Gluc Sensor (FREESTYLE LIBRE 14 DAY SENSOR) MISC 1 each by Does not apply route every 14 (fourteen) days. 05/12/17  Yes Sowles, Drue Stager, MD  Continuous Blood Gluc Sensor (FREESTYLE LIBRE SENSOR SYSTEM) MISC USE AS DIRECTED 05/22/17  Yes Sowles, Drue Stager, MD  Dapagliflozin-metFORMIN HCl ER (XIGDUO XR) 08-998 MG TB24 Take 1 tablet by mouth 2 (two) times daily. 06/02/17  Yes Sowles, Drue Stager, MD  ezetimibe (ZETIA) 10 MG tablet Take 1 tablet (10 mg total) by mouth daily. 08/15/16  Yes Gollan, Kathlene November, MD  fluticasone (FLONASE) 50 MCG/ACT nasal spray Place 2 sprays into both nostrils as needed. 11/10/14  Yes Sowles, Drue Stager, MD  Icosapent Ethyl (VASCEPA) 1 g CAPS Take 2 capsules by mouth 2 (two) times daily. 05/12/17  Yes Sowles, Drue Stager, MD  Insulin Degludec-Liraglutide (XULTOPHY) 100-3.6 UNIT-MG/ML SOPN Inject 16 Units into the skin daily before breakfast. Stop Trulicity Increase by 2 units every 3 days to a max of 50 05/12/17  Yes Sowles, Drue Stager, MD  lisinopril (PRINIVIL,ZESTRIL) 10 MG tablet Take 1 tablet (10 mg total) by mouth daily. 05/12/17  Yes Sowles, Drue Stager, MD  loratadine (CLARITIN) 10 MG tablet Take 1  tablet by mouth daily. 08/09/12  Yes [provider]  metaxalone (SKELAXIN) 800 MG tablet Take 1 tablet (800 mg total) by mouth 3 (three) times daily as needed for muscle spasms. 09/18/16  Yes Hubbard Hartshorn, FNP  metoprolol succinate (TOPROL-XL) 25 MG 24 hr tablet Take 1 tablet (25 mg total) by mouth daily. 05/12/17  Yes Sowles, Drue Stager, MD  ranitidine (ZANTAC) 150 MG tablet TAKE 1 TABLET BY MOUTH TWICE A DAY 04/16/17  Yes Sowles, Drue Stager, MD  sildenafil  (REVATIO) 20 MG tablet TAKE 1 TO 5 TABLETS BY MOUTH AS NEEDED 03/22/17  Yes Sowles, Drue Stager, MD  traMADol (ULTRAM) 50 MG tablet Take 1 tablet (50 mg total) by mouth every 8 (eight) hours as needed. 03/08/17  Yes Kiven Vangilder G, DO  benzonatate (TESSALON) 100 MG capsule Take 1 capsule (100 mg total) by mouth 3 (three) times daily as needed. 06/20/17   Coral Spikes, DO  chlorpheniramine-HYDROcodone (TUSSIONEX PENNKINETIC ER) 10-8 MG/5ML SUER Take 5 mLs by mouth every 12 (twelve) hours as needed. 06/20/17   Coral Spikes, DO  Continuous Blood Gluc Receiver (FREESTYLE LIBRE READER) DEVI Apply 2 each once a week topically. 02/09/17   Steele Sizer, MD    Family History Family History  Problem Relation Age of Onset  . Diabetes Mother   . CAD Mother   . Heart disease Mother   . Heart attack Mother   . Heart disease Father   . Heart attack Father     Social History Social History   Tobacco Use  . Smoking status: Former Smoker    Packs/day: 1.50    Years: 30.00    Pack years: 45.00    Types: Cigarettes    Last attempt to quit: 04/19/2013    Years since quitting: 4.1  . Smokeless tobacco: Current User  Substance Use Topics  . Alcohol use: No    Alcohol/week: 0.0 oz    Frequency: Never    Comment: occ  . Drug use: No     Allergies   Patient has no known allergies.   Review of Systems Review of Systems  Constitutional: Negative for chills and fever.  HENT: Positive for congestion.   Respiratory: Positive for cough.    Physical Exam Triage Vital Signs ED Triage Vitals  Enc Vitals Group     BP 06/20/17 0822 133/84     Pulse Rate 06/20/17 0822 100     Resp 06/20/17 0822 16     Temp 06/20/17 0822 98.5 F (36.9 C)     Temp Source 06/20/17 0822 Oral     SpO2 06/20/17 0822 98 %     Weight 06/20/17 0823 215 lb (97.5 kg)     Height 06/20/17 0823 5' 8"  (1.727 m)     Head Circumference --      Peak Flow --      Pain Score 06/20/17 0823 6     Pain Loc --      Pain Edu? --       Excl. in [place]? --    Updated Vital Signs BP 133/84 (BP Location: Left Arm)   Pulse 100   Temp 98.5 F (36.9 C) (Oral)   Resp 16   Ht 5' 8"  (1.727 m)   Wt 215 lb (97.5 kg)   SpO2 98%   BMI 32.69 kg/m   Physical Exam  Constitutional: He is oriented to person, place, and time. He appears well-developed. No distress.  HENT:  Head: Normocephalic and atraumatic.  Mouth/Throat: Oropharynx is clear and moist.  Eyes: Conjunctivae are normal. Right eye exhibits no discharge. Left eye exhibits no discharge.  Cardiovascular: Normal rate and regular rhythm.  Pulmonary/Chest: Effort normal and breath sounds normal. He has no wheezes. He has no rales.  Neurological: He is alert and oriented to person, place, and time.  Psychiatric: He has a normal mood and affect. His behavior is normal.  Nursing note and vitals reviewed.  UC Treatments / Results  Labs (all labs ordered are listed, but only abnormal results are displayed) Labs Reviewed - No data to display  EKG  EKG Interpretation None       Radiology No results found.  Procedures Procedures (including critical care time)  Medications Ordered in UC Medications - No data to display   Initial Impression / Assessment and Plan / UC Course  I have reviewed the triage vital signs and the nursing notes.  Pertinent labs & imaging results that were available during my care of the patient were reviewed by me and considered in my medical decision making (see chart for details).     57 year old male presents with a viral URI with cough.  Treating with Tessalon Perles and Tussionex.  Continue Flonase.  Final Clinical Impressions(s) / UC Diagnoses   Final diagnoses:  Viral URI with cough    ED Discharge Orders        Ordered    benzonatate (TESSALON) 100 MG capsule  3 times daily PRN     06/20/17 0843    chlorpheniramine-HYDROcodone (TUSSIONEX PENNKINETIC ER) 10-8 MG/5ML SUER  Every 12 hours PRN     06/20/17 0843      Controlled Substance Prescriptions [place] Controlled Substance Registry consulted? Not Applicable   Coral Spikes, [place] 06/20/17 8916

## 2017-06-20 NOTE — ED Triage Notes (Signed)
Patient c/o cough nasal and chest congestion x 3 days ago.

## 2017-06-23 ENCOUNTER — Other Ambulatory Visit: Payer: Self-pay | Admitting: Family Medicine

## 2017-06-23 DIAGNOSIS — E1121 Type 2 diabetes mellitus with diabetic nephropathy: Secondary | ICD-10-CM

## 2017-06-23 MED ORDER — INSULIN PEN NEEDLE 32G X 4 MM MISC
12 refills | Status: DC
Start: 2017-06-23 — End: 2018-06-26

## 2017-06-23 NOTE — Telephone Encounter (Signed)
Refill request for diabetic medication:   Nano needles   Last office visit pertaining to diabetes: 05/12/2017   Lab Results  Component Value Date   HGBA1C 8.2 05/12/2017    Follow-up on file. 08/14/2017

## 2017-06-23 NOTE — Telephone Encounter (Signed)
Copied from [place] [id number]. Topic: Quick Communication - Rx Refill/Question >> Jun 23, 2017  9:27 AM Ether Griffins B wrote: Medication: pt would like the NANO needles called in.    Preferred Pharmacy (with phone number or street name): [id number] [place], [place] Please be advised that RX refills may take up to 3 business days. We ask that you follow-up with your pharmacy.

## 2017-06-30 ENCOUNTER — Encounter: Payer: Self-pay | Admitting: Family Medicine

## 2017-06-30 ENCOUNTER — Ambulatory Visit: Payer: Managed Care, Other (non HMO) | Admitting: Family Medicine

## 2017-06-30 VITALS — BP 110/70 | HR 90 | Resp 16 | Ht 68.0 in | Wt 218.8 lb

## 2017-06-30 DIAGNOSIS — E1121 Type 2 diabetes mellitus with diabetic nephropathy: Secondary | ICD-10-CM | POA: Diagnosis not present

## 2017-06-30 DIAGNOSIS — M62838 Other muscle spasm: Secondary | ICD-10-CM | POA: Diagnosis not present

## 2017-06-30 MED ORDER — MELOXICAM 15 MG PO TABS: 15.0000 mg | ORAL_TABLET | Freq: Every day | ORAL | 0 refills | Status: DC

## 2017-06-30 MED ORDER — TRAMADOL HCL 50 MG PO TABS: 50.0000 mg | ORAL_TABLET | Freq: Three times a day (TID) | ORAL | 0 refills | Status: DC | PRN

## 2017-06-30 MED ORDER — METAXALONE 800 MG PO TABS: 800.0000 mg | ORAL_TABLET | Freq: Three times a day (TID) | ORAL | 0 refills | Status: DC | PRN

## 2017-06-30 NOTE — Progress Notes (Signed)
Name: Jesse Lawrence.   MRN: [id number]    DOB: [birth year]   Date:06/30/2017       Progress Note  Subjective  Chief Complaint  Chief Complaint  Patient presents with  . Neck Pain  . Shoulder Pain    HPI  Neck spasms: he states that 5 days ago he picked up a heavy box , about 70 lbs at work, woke up on Saturday with some posterior neck discomfort. 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 Yesterday he woke up in severe pain 9/10, unable to move his neck, pain radiates to shoulder but not to arms, no weakness or tingling/numbness. He states pain improved a little with muscle relaxer, but ibuprofen did not help.    DM: he states glucose was very well controlled up to yesterday, fasting went up to 130 yesterday. He denies polyphagia, polydipsia or polyuria. Explained that steroid injection will raise his glucose.    Patient Active Problem List   Diagnosis Date Noted  . OSA (obstructive sleep apnea) 05/21/2015  . Perennial allergic rhinitis 11/07/2014  . Arteriosclerosis of coronary artery 11/07/2014  . CD (contact dermatitis) 11/07/2014  . Decreased libido 11/07/2014  . Diabetes mellitus with renal manifestation (HCC) 11/07/2014  . Gastro-esophageal reflux disease without esophagitis 11/07/2014  . H/O acute myocardial infarction 11/07/2014  . Hypercholesteremia 11/07/2014  . Benign hypertension 11/07/2014  . Hypertriglyceridemia 11/07/2014  . H/O high risk medication treatment 11/07/2014  . NASH (nonalcoholic steatohepatitis) 11/07/2014  . Microalbuminuria 11/07/2014  . Adult BMI 30+ 11/07/2014  . Fungal infection of toenail 11/07/2014  . Tobacco abuse 11/07/2014  . ED (erectile dysfunction) of organic origin 09/29/2006    Past Surgical History:  Procedure Laterality Date  . cadiac stenting    . CYSTECTOMY     57  years of age  . KNEE ARTHROSCOPY      Family  History  Problem Relation Age of Onset  . Diabetes Mother   . CAD Mother   . Heart disease Mother   . Heart attack Mother   . Heart disease Father   . Heart attack Father     Social History   Socioeconomic History  . Marital status: Married    Spouse name: Not on file  . Number of children: Not on file  . Years of education: Not on file  . Highest education level: Not on file  Occupational History  . Not on file  Social Needs  . Financial resource strain: Not on file  . Food insecurity:    Worry: Not on file    Inability: Not on file  . Transportation needs:    Medical: Not on file    Non-medical: Not on file  Tobacco Use  . Smoking status: Former Smoker    Packs/day: 1.50    Years: 30.00    Pack years: 45.00    Types: Cigarettes    Last attempt to quit: 04/19/2013    Years since quitting: 4.2  . Smokeless tobacco: Current User  Substance and Sexual Activity  . Alcohol use: No    Alcohol/week: 0.0 oz    Frequency: Never    Comment: occ  . Drug use: No  . Sexual activity: Yes  Lifestyle  . Physical activity:    Days per week: Not on file    Minutes per session: Not on file  .  Stress: Not on file  Relationships  . Social connections:    Talks on phone: Not on file    Gets together: Not on file    Attends religious service: Not on file    Active member of club or organization: Not on file    Attends meetings of clubs or organizations: Not on file    Relationship status: Not on file  . Intimate partner violence:    Fear of current or ex partner: Not on file    Emotionally abused: Not on file    Physically abused: Not on file    Forced sexual activity: Not on file  Other Topics Concern  . Not on file  Social History Narrative  . Not on file     Current Outpatient Medications:  .  atorvastatin (LIPITOR) 80 MG tablet, Take 1 tablet (80 mg total) by mouth daily at 6 PM., Disp: 90 tablet, Rfl: 1 .  clonazePAM (KLONOPIN) 0.5 MG tablet, Take 1 tablet (0.5 mg  total) by mouth 2 (two) times daily as needed for anxiety. Before flights, Disp: 6 tablet, Rfl: 0 .  clopidogrel (PLAVIX) 75 MG tablet, Take 1 tablet (75 mg total) by mouth daily., Disp: 90 tablet, Rfl: 1 .  Continuous Blood Gluc Receiver (FREESTYLE LIBRE READER) DEVI, Apply 2 each once a week topically., Disp: 2 Device, Rfl: 5 .  Continuous Blood Gluc Sensor (FREESTYLE LIBRE 14 DAY SENSOR) MISC, 1 each by Does not apply route every 14 (fourteen) days., Disp: 2 each, Rfl: 5 .  Continuous Blood Gluc Sensor (FREESTYLE LIBRE SENSOR SYSTEM) MISC, USE AS DIRECTED, Disp: 1 each, Rfl: 1 .  Dapagliflozin-metFORMIN HCl ER (XIGDUO XR) 08-998 MG TB24, Take 1 tablet by mouth 2 (two) times daily., Disp: 60 tablet, Rfl: 3 .  ezetimibe (ZETIA) 10 MG tablet, Take 1 tablet (10 mg total) by mouth daily., Disp: 30 tablet, Rfl: 11 .  fluticasone (FLONASE) 50 MCG/ACT nasal spray, Place 2 sprays into both nostrils as needed., Disp: 48 g, Rfl: 1 .  Icosapent Ethyl (VASCEPA) 1 g CAPS, Take 2 capsules by mouth 2 (two) times daily., Disp: 360 capsule, Rfl: 1 .  Insulin Degludec-Liraglutide (XULTOPHY) 100-3.6 UNIT-MG/ML SOPN, Inject 16 Units into the skin daily before breakfast. Stop Trulicity Increase by 2 units every 3 days to a max of 50, Disp: 9 mL, Rfl: 2 .  Insulin Pen Needle (BD PEN NEEDLE NANO U/F) 32G X 4 MM MISC, Use as directed. E11.29, Disp: 100 each, Rfl: 12 .  lisinopril (PRINIVIL,ZESTRIL) 10 MG tablet, Take 1 tablet (10 mg total) by mouth daily., Disp: 90 tablet, Rfl: 1 .  loratadine (CLARITIN) 10 MG tablet, Take 1 tablet by mouth daily., Disp: , Rfl:  .  metaxalone (SKELAXIN) 800 MG tablet, Take 1 tablet (800 mg total) by mouth 3 (three) times daily as needed for muscle spasms., Disp: 30 tablet, Rfl: 0 .  metoprolol succinate (TOPROL-XL) 25 MG 24 hr tablet, Take 1 tablet (25 mg total) by mouth daily., Disp: 90 tablet, Rfl: 1 .  ranitidine (ZANTAC) 150 MG tablet, TAKE 1 TABLET BY MOUTH TWICE A DAY, Disp: 180  tablet, Rfl: 1 .  sildenafil (REVATIO) 20 MG tablet, TAKE 1 TO 5 TABLETS BY MOUTH AS NEEDED, Disp: 50 tablet, Rfl: 0 .  traMADol (ULTRAM) 50 MG tablet, Take 1 tablet (50 mg total) by mouth every 8 (eight) hours as needed. (Patient not taking: Reported on 06/30/2017), Disp: 15 tablet, Rfl: 0  No Known Allergies  ROS  Constitutional: Negative for fever or weight change.  Respiratory: Negative for cough and shortness of breath.   Cardiovascular: Negative for chest pain or palpitations.  Gastrointestinal: Negative for abdominal pain, no bowel changes.  Musculoskeletal: Negative for gait problem or joint swelling.  Skin: Negative for rash.  Neurological: Negative for dizziness or headache.  No other specific complaints in a complete review of systems (except as listed in HPI above).  Objective  Vitals:   06/30/17 0744  BP: 110/70  Pulse: 90  Resp: 16  SpO2: 97%  Weight: 218 lb 12.8 oz (99.2 kg)  Height: 5' 8"  (1.727 m)    Body mass index is 33.27 kg/m.  Physical Exam  Constitutional: Patient appears well-developed and well-nourished. Obese  No distress.  HEENT: head atraumatic, normocephalic, pupils equal and reactive to light,  throat within normal limits Cardiovascular: Normal rate, regular rhythm and normal heart sounds.  No murmur heard. No BLE edema. Pulmonary/Chest: Effort normal and breath sounds normal. No respiratory distress. Abdominal: Soft.  There is no tenderness. Psychiatric: Patient has a normal mood and affect. behavior is normal. Judgment and thought content normal. Muscular Skeletal: able to move his neck, but severe pain with rom. Pain during palpation of posterior neck, pain on his neck when abducting right shoulder, normal grip, normal sensation of both arms.   Recent Results (from the past 2160 hour(s))  POCT Glucose (CBG)     Status: Abnormal   Collection Time: 05/12/17  7:59 AM  Result Value Ref Range   POC Glucose 144 (A) 70 - 99 mg/dl  POCT HgB A1C      Status: None   Collection Time: 05/12/17  8:00 AM  Result Value Ref Range   Hemoglobin A1C 8.2   COMPLETE METABOLIC PANEL WITH GFR     Status: Abnormal   Collection Time: 05/12/17  8:30 AM  Result Value Ref Range   Glucose, Bld 143 (H) 65 - 139 mg/dL    Comment: .        Non-fasting reference interval .    BUN 19 7 - 25 mg/dL   Creat 0.83 0.70 - 1.33 mg/dL    Comment: For patients >44 years of age, the reference limit for Creatinine is approximately 13% higher for people identified as African-American. .    GFR, Est Non African American 100 > OR = 60 mL/min/1.46m   GFR, Est African American 116 > OR = 60 mL/min/1.740m  BUN/Creatinine Ratio NOT APPLICABLE 6 - 22 (calc)   Sodium 136 135 - 146 mmol/L   Potassium 4.5 3.5 - 5.3 mmol/L   Chloride 102 98 - 110 mmol/L   CO2 26 20 - 32 mmol/L   Calcium 10.0 8.6 - 10.3 mg/dL   Total Protein 7.4 6.1 - 8.1 g/dL   Albumin 4.9 3.6 - 5.1 g/dL   Globulin 2.5 1.9 - 3.7 g/dL (calc)   AG Ratio 2.0 1.0 - 2.5 (calc)   Total Bilirubin 1.6 (H) 0.2 - 1.2 mg/dL   Alkaline phosphatase (APISO) 67 40 - 115 U/L   AST 17 10 - 35 U/L   ALT 29 9 - 46 U/L  Lipid panel     Status: Abnormal   Collection Time: 05/12/17  8:30 AM  Result Value Ref Range   Cholesterol 93 <200 mg/dL   HDL 25 (L) >40 mg/dL   Triglycerides 206 (H) <150 mg/dL   LDL Cholesterol (Calc) 41 mg/dL (calc)    Comment: Reference range: <100 . Desirable  range <100 mg/dL for primary prevention;   <70 mg/dL for patients with CHD or diabetic patients  with > or = 2 CHD risk factors. Marland Kitchen LDL-C is now calculated using the Martin-Hopkins  calculation, which is a validated novel method providing  better accuracy than the Friedewald equation in the  estimation of LDL-C.  Cresenciano Genre et al. Annamaria Helling. 8657;846(96): 2061-2068  (http://education.[facility].com/faq/FAQ164)    Total CHOL/HDL Ratio 3.7 <5.0 (calc)   Non-HDL Cholesterol (Calc) 68 <130 mg/dL (calc)    Comment: For patients  with diabetes plus 1 major ASCVD risk  factor, treating to a non-HDL-C goal of <100 mg/dL  (LDL-C of <70 mg/dL) is considered a therapeutic  option.   CBC with Differential/Platelet     Status: None   Collection Time: 05/12/17  8:30 AM  Result Value Ref Range   WBC 7.6 3.8 - 10.8 Thousand/uL   RBC 5.06 4.20 - 5.80 Million/uL   Hemoglobin 15.9 13.2 - 17.1 g/dL   HCT 45.6 38.5 - 50.0 %   MCV 90.1 80.0 - 100.0 fL   MCH 31.4 27.0 - 33.0 pg   MCHC 34.9 32.0 - 36.0 g/dL   RDW 11.8 11.0 - 15.0 %   Platelets 295 140 - 400 Thousand/uL   MPV 10.6 7.5 - 12.5 fL   Neutro Abs 4,674 1,500 - 7,800 cells/uL   Lymphs Abs 2,022 850 - 3,900 cells/uL   WBC mixed population 752 200 - 950 cells/uL   Eosinophils Absolute 91 15 - 500 cells/uL   Basophils Absolute 61 0 - 200 cells/uL   Neutrophils Relative % 61.5 %   Total Lymphocyte 26.6 %   Monocytes Relative 9.9 %   Eosinophils Relative 1.2 %   Basophils Relative 0.8 %     PHQ2/9: Depression screen [facility] 2/9 07/30/2016 05/26/2016 11/16/2015 08/17/2015 07/04/2015  Decreased Interest 0 0 0 0 0  Down, Depressed, Hopeless 0 0 0 0 0  PHQ - 2 Score 0 0 0 0 0     Fall Risk: Fall Risk  07/30/2016 05/26/2016 11/16/2015 08/17/2015 07/04/2015  Falls in the past year? No No No No No     Functional Status Survey: Is the patient deaf or have difficulty hearing?: No Does the patient have difficulty seeing, even when wearing glasses/contacts?: No Does the patient have difficulty concentrating, remembering, or making decisions?: No Does the patient have difficulty walking or climbing stairs?: No Does the patient have difficulty dressing or bathing?: No Does the patient have difficulty doing errands alone such as visiting a doctor's office or shopping?: No    Assessment & Plan  1. Neck muscle spasm  Informed consent signed.  A trigger point injection was performed at the site of maximal tenderness using 1% plain Lidocaine and Kenalog 40 mg/1 ml . This was  well tolerated, and followed by relief of pain.   2. Type 2 diabetes mellitus with diabetic nephropathy, without long-term current use of insulin (HCC)  Monitor glucose closely this week, also be more strict with diet.

## 2017-06-30 NOTE — Patient Instructions (Signed)
Cervical Strain and Sprain Rehab Ask your health care provider which exercises are safe for you. Do exercises exactly as told by your health care provider and adjust them as directed. It is normal to feel mild stretching, pulling, tightness, or discomfort as you do these exercises, but you should stop right away if you feel sudden pain or your pain gets worse.Do not begin these exercises until told by your health care provider. Stretching and range of motion exercises These exercises warm up your muscles and joints and improve the movement and flexibility of your neck. These exercises also help to relieve pain, numbness, and tingling. Exercise A: Cervical side bend  1. Using good posture, sit on a stable chair or stand up. 2. Without moving your shoulders, slowly tilt your left / right ear to your shoulder until you feel a stretch in your neck muscles. You should be looking straight ahead. 3. Hold for __________ seconds. 4. Repeat with the other side of your neck. Repeat __________ times. Complete this exercise __________ times a day. Exercise B: Cervical rotation  1. Using good posture, sit on a stable chair or stand up. 2. Slowly turn your head to the side as if you are looking over your left / right shoulder. ? Keep your eyes level with the ground. ? Stop when you feel a stretch along the side and the back of your neck. 3. Hold for __________ seconds. 4. Repeat this by turning to your other side. Repeat __________ times. Complete this exercise __________ times a day. Exercise C: Thoracic extension and pectoral stretch 1. Roll a towel or a small blanket so it is about 4 inches (10 cm) in diameter. 2. Lie down on your back on a firm surface. 3. Put the towel lengthwise, under your spine in the middle of your back. It should not be not under your shoulder blades. The towel should line up with your spine from your middle back to your lower back. 4. Put your hands behind your head and let your  elbows fall out to your sides. 5. Hold for __________ seconds. Repeat __________ times. Complete this exercise __________ times a day. Strengthening exercises These exercises build strength and endurance in your neck. Endurance is the ability to use your muscles for a long time, even after your muscles get tired. Exercise D: Upper cervical flexion, isometric 1. Lie on your back with a thin pillow behind your head and a small rolled-up towel under your neck. 2. Gently tuck your chin toward your chest and nod your head down to look toward your feet. Do not lift your head off the pillow. 3. Hold for __________ seconds. 4. Release the tension slowly. Relax your neck muscles completely before you repeat this exercise. Repeat __________ times. Complete this exercise __________ times a day. Exercise E: Cervical extension, isometric  1. Stand about 6 inches (15 cm) away from a wall, with your back facing the wall. 2. Place a soft object, about 6-8 inches (15-20 cm) in diameter, between the back of your head and the wall. A soft object could be a small pillow, a ball, or a folded towel. 3. Gently tilt your head back and press into the soft object. Keep your jaw and forehead relaxed. 4. Hold for __________ seconds. 5. Release the tension slowly. Relax your neck muscles completely before you repeat this exercise. Repeat __________ times. Complete this exercise __________ times a day. Posture and body mechanics  Body mechanics refers to the movements and positions of  your body while you do your daily activities. Posture is part of body mechanics. Good posture and healthy body mechanics can help to relieve stress in your body's tissues and joints. Good posture means that your spine is in its natural S-curve position (your spine is neutral), your shoulders are pulled back slightly, and your head is not tipped forward. The following are general guidelines for applying improved posture and body mechanics to  your everyday activities. Standing  When standing, keep your spine neutral and keep your feet about hip-width apart. Keep a slight bend in your knees. Your ears, shoulders, and hips should line up.  When you do a task in which you stand in one place for a long time, place one foot up on a stable object that is 2-4 inches (5-10 cm) high, such as a footstool. This helps keep your spine neutral. Sitting   When sitting, keep your spine neutral and your keep feet flat on the floor. Use a footrest, if necessary, and keep your thighs parallel to the floor. Avoid rounding your shoulders, and avoid tilting your head forward.  When working at a desk or a computer, keep your desk at a height where your hands are slightly lower than your elbows. Slide your chair under your desk so you are close enough to maintain good posture.  When working at a computer, place your monitor at a height where you are looking straight ahead and you do not have to tilt your head forward or downward to look at the screen. Resting When lying down and resting, avoid positions that are most painful for you. Try to support your neck in a neutral position. You can use a contour pillow or a small rolled-up towel. Your pillow should support your neck but not push on it. This information is not intended to replace advice given to you by your health care provider. Make sure you discuss any questions you have with your health care provider. Document Released: 03/24/2005 Document Revised: 11/29/2015 Document Reviewed: 02/28/2015 Elsevier Interactive Patient Education  [employer].

## 2017-07-19 ENCOUNTER — Other Ambulatory Visit: Payer: Self-pay | Admitting: Family Medicine

## 2017-07-19 DIAGNOSIS — I10 Essential (primary) hypertension: Secondary | ICD-10-CM

## 2017-07-19 DIAGNOSIS — I2510 Atherosclerotic heart disease of native coronary artery without angina pectoris: Secondary | ICD-10-CM

## 2017-07-20 NOTE — Telephone Encounter (Signed)
6 month supply of metoprolol sent to [facility] in February; he can transfer remaining refill if desired; too early for new prescription

## 2017-07-20 NOTE — Telephone Encounter (Signed)
Called patient. Patient aware.

## 2017-07-22 ENCOUNTER — Other Ambulatory Visit: Payer: Self-pay

## 2017-07-22 DIAGNOSIS — N529 Male erectile dysfunction, unspecified: Secondary | ICD-10-CM

## 2017-07-22 MED ORDER — SILDENAFIL CITRATE 20 MG PO TABS: ORAL_TABLET | ORAL | 0 refills | Status: DC

## 2017-07-22 NOTE — Telephone Encounter (Signed)
Refill request for general medication: Sildenafil 20 mg  Last office visit: 06/30/2017  Last physical exam: None indicated  Follow-ups on file. 08/14/2017

## 2017-07-27 ENCOUNTER — Other Ambulatory Visit: Payer: Self-pay | Admitting: Family Medicine

## 2017-07-27 DIAGNOSIS — M62838 Other muscle spasm: Secondary | ICD-10-CM

## 2017-08-14 ENCOUNTER — Ambulatory Visit: Payer: Managed Care, Other (non HMO) | Admitting: Family Medicine

## 2017-08-14 ENCOUNTER — Encounter: Payer: Self-pay | Admitting: Family Medicine

## 2017-08-14 VITALS — BP 108/72 | HR 95 | Temp 97.7°F | Resp 16 | Ht 68.0 in | Wt 222.3 lb

## 2017-08-14 DIAGNOSIS — E7800 Pure hypercholesterolemia, unspecified: Secondary | ICD-10-CM

## 2017-08-14 DIAGNOSIS — K219 Gastro-esophageal reflux disease without esophagitis: Secondary | ICD-10-CM | POA: Diagnosis not present

## 2017-08-14 DIAGNOSIS — J40 Bronchitis, not specified as acute or chronic: Secondary | ICD-10-CM

## 2017-08-14 DIAGNOSIS — E1121 Type 2 diabetes mellitus with diabetic nephropathy: Secondary | ICD-10-CM | POA: Diagnosis not present

## 2017-08-14 DIAGNOSIS — E1169 Type 2 diabetes mellitus with other specified complication: Secondary | ICD-10-CM

## 2017-08-14 DIAGNOSIS — I10 Essential (primary) hypertension: Secondary | ICD-10-CM | POA: Diagnosis not present

## 2017-08-14 DIAGNOSIS — I2510 Atherosclerotic heart disease of native coronary artery without angina pectoris: Secondary | ICD-10-CM | POA: Diagnosis not present

## 2017-08-14 DIAGNOSIS — E781 Pure hyperglyceridemia: Secondary | ICD-10-CM | POA: Diagnosis not present

## 2017-08-14 DIAGNOSIS — E785 Hyperlipidemia, unspecified: Secondary | ICD-10-CM | POA: Diagnosis not present

## 2017-08-14 LAB — POCT UA - MICROALBUMIN: MICROALBUMIN (UR) POC: 100 mg/L

## 2017-08-14 LAB — POCT GLYCOSYLATED HEMOGLOBIN (HGB A1C): Hemoglobin A1C: 7.1

## 2017-08-14 MED ORDER — ICOSAPENT ETHYL 1 G PO CAPS: 2.0000 | ORAL_CAPSULE | Freq: Two times a day (BID) | ORAL | 1 refills | Status: DC

## 2017-08-14 MED ORDER — METOPROLOL SUCCINATE ER 25 MG PO TB24: 25.0000 mg | ORAL_TABLET | Freq: Every day | ORAL | 1 refills | Status: DC

## 2017-08-14 MED ORDER — INSULIN DEGLUDEC-LIRAGLUTIDE 100-3.6 UNIT-MG/ML ~~LOC~~ SOPN: 50.0000 [IU] | PEN_INJECTOR | Freq: Every day | SUBCUTANEOUS | 2 refills | Status: DC

## 2017-08-14 MED ORDER — ATORVASTATIN CALCIUM 80 MG PO TABS: 80.0000 mg | ORAL_TABLET | Freq: Every day | ORAL | 1 refills | Status: DC

## 2017-08-14 MED ORDER — DAPAGLIFLOZIN PRO-METFORMIN ER 5-1000 MG PO TB24: 1.0000 | ORAL_TABLET | Freq: Two times a day (BID) | ORAL | 3 refills | Status: DC

## 2017-08-14 MED ORDER — LISINOPRIL 10 MG PO TABS: 10.0000 mg | ORAL_TABLET | Freq: Every day | ORAL | 1 refills | Status: DC

## 2017-08-14 MED ORDER — FLUTICASONE FUROATE-VILANTEROL 100-25 MCG/INH IN AEPB: 1.0000 | INHALATION_SPRAY | Freq: Every day | RESPIRATORY_TRACT | 0 refills | Status: DC

## 2017-08-14 MED ORDER — CLOPIDOGREL BISULFATE 75 MG PO TABS: 75.0000 mg | ORAL_TABLET | Freq: Every day | ORAL | 1 refills | Status: DC

## 2017-08-14 MED ORDER — RANITIDINE HCL 150 MG PO TABS: 150.0000 mg | ORAL_TABLET | Freq: Two times a day (BID) | ORAL | 1 refills | Status: DC

## 2017-08-14 NOTE — Progress Notes (Signed)
Name: Jesse Lawrence.   MRN: [id number]    DOB: 12/30/1960   Date:08/14/2017       Progress Note  Subjective  Chief Complaint  Chief Complaint  Patient presents with  . Medication Refill  . Hypertension  . Diabetes  . Gastroesophageal Reflux  . Erectile Dysfunction  . Sleep Apnea  . Back spasms    HPI  DM II : HispasthgbA1C 9.8% to 7.9%, up to 9.1% ,9.6% down to7.8 %, while at [facility] up to 8.3%,8.3% ,8.2% since on Xultophy - started 05/2017 hgbA1C is down to 7.1% He is still on Xigduo . Denies side effects. He has noticed polyphagia, but denies polyuria or polydipsia. He has microalbuminuria and is on ACE. He has dyslipidemia, and is taking mediation. Last triglycerides was much better. He has noticed some muscle spasms lately, advised more water and stretch at night.   HTN/CAD: no chest pain, no palpitation, bp is towards low end of normal no side effects of medication, including no dizziness. He denies decrease in exercise tolerance. He is on Plavix, statin and Zetia., Vascepa - but needs a refill now  GERD: he is off Omeprazole, he is off energy drinks,he still has heartburn and taking Ranitidine twice daily, he has intermittent heartburn that resolves with Tums, denies abdominal pain.  ED: he has difficulty maintaining and erection, uses Cialis prn, he is doing well on Sildenafil prn  OSA: he has not been compliant with CPAP machine  Back spasms: he is doing well now, he states Baclofen stopped working, but was given Skelaxin and is doing well at this time  Morbid Obesity: heis now eating healthier, he gained about 2 lbs since started on Xultophy  Bronchitis : he used to smoke but quit over 4 years ago, over the past week he has noticed a productive cough, white sputum, no wheezing or SOB. No fever or chills. He has albuterol and thinks Breo or Advair at home but is not using, advised him to resume it.   Patient Active Problem List   Diagnosis Date Noted  .  OSA (obstructive sleep apnea) 05/21/2015  . Perennial allergic rhinitis 11/07/2014  . Arteriosclerosis of coronary artery 11/07/2014  . CD (contact dermatitis) 11/07/2014  . Decreased libido 11/07/2014  . Diabetes mellitus with renal manifestation ([place]) 11/07/2014  . Gastro-esophageal reflux disease without esophagitis 11/07/2014  . H/O acute myocardial infarction 11/07/2014  . Hypercholesteremia 11/07/2014  . Benign hypertension 11/07/2014  . Hypertriglyceridemia 11/07/2014  . H/O high risk medication treatment 11/07/2014  . NASH (nonalcoholic steatohepatitis) 11/07/2014  . Microalbuminuria 11/07/2014  . Adult BMI 30+ 11/07/2014  . Fungal infection of toenail 11/07/2014  . Tobacco abuse 11/07/2014  . ED (erectile dysfunction) of organic origin 09/29/2006    Past Surgical History:  Procedure Laterality Date  . cadiac stenting    . CYSTECTOMY     57 years of age  . KNEE ARTHROSCOPY      Family History  Problem Relation Age of Onset  . Diabetes Mother   . CAD Mother   . Heart disease Mother   . Heart attack Mother   . Heart disease Father   . Heart attack Father     Social History   Socioeconomic History  . Marital status: Married    Spouse name: Not on file  . Number of children: Not on file  . Years of education: Not on file  . Highest education level: Not on file  Occupational History  . Not on  file  Social Needs  . Financial resource strain: Not on file  . Food insecurity:    Worry: Not on file    Inability: Not on file  . Transportation needs:    Medical: Not on file    Non-medical: Not on file  Tobacco Use  . Smoking status: Former Smoker    Packs/day: 1.50    Years: 30.00    Pack years: 45.00    Types: Cigarettes    Last attempt to quit: 04/19/2013    Years since quitting: 4.3  . Smokeless tobacco: Current User  Substance and Sexual Activity  . Alcohol use: No    Alcohol/week: 0.0 oz    Frequency: Never    Comment: occ  . Drug use: No  .  Sexual activity: Yes    Partners: Female  Lifestyle  . Physical activity:    Days per week: Not on file    Minutes per session: Not on file  . Stress: Not on file  Relationships  . Social connections:    Talks on phone: Not on file    Gets together: Not on file    Attends religious service: Not on file    Active member of club or organization: Not on file    Attends meetings of clubs or organizations: Not on file    Relationship status: Not on file  . Intimate partner violence:    Fear of current or ex partner: Not on file    Emotionally abused: Not on file    Physically abused: Not on file    Forced sexual activity: Not on file  Other Topics Concern  . Not on file  Social History Narrative  . Not on file     Current Outpatient Medications:  .  atorvastatin (LIPITOR) 80 MG tablet, Take 1 tablet (80 mg total) by mouth daily at 6 PM., Disp: 90 tablet, Rfl: 1 .  clonazePAM (KLONOPIN) 0.5 MG tablet, Take 1 tablet (0.5 mg total) by mouth 2 (two) times daily as needed for anxiety. Before flights, Disp: 6 tablet, Rfl: 0 .  clopidogrel (PLAVIX) 75 MG tablet, Take 1 tablet (75 mg total) by mouth daily., Disp: 90 tablet, Rfl: 1 .  Dapagliflozin-metFORMIN HCl ER (XIGDUO XR) 08-998 MG TB24, Take 1 tablet by mouth 2 (two) times daily., Disp: 60 tablet, Rfl: 3 .  ezetimibe (ZETIA) 10 MG tablet, Take 1 tablet (10 mg total) by mouth daily., Disp: 30 tablet, Rfl: 11 .  fluticasone (FLONASE) 50 MCG/ACT nasal spray, Place 2 sprays into both nostrils as needed., Disp: 48 g, Rfl: 1 .  Icosapent Ethyl (VASCEPA) 1 g CAPS, Take 2 capsules (2 g total) by mouth 2 (two) times daily., Disp: 360 capsule, Rfl: 1 .  Insulin Degludec-Liraglutide (XULTOPHY) 100-3.6 UNIT-MG/ML SOPN, Inject 50 Units into the skin daily before breakfast. Daily, Disp: 9 mL, Rfl: 2 .  Insulin Pen Needle (BD PEN NEEDLE NANO U/F) 32G X 4 MM MISC, Use as directed. E11.29, Disp: 100 each, Rfl: 12 .  lisinopril (PRINIVIL,ZESTRIL) 10 MG  tablet, Take 1 tablet (10 mg total) by mouth daily., Disp: 90 tablet, Rfl: 1 .  loratadine (CLARITIN) 10 MG tablet, Take 1 tablet by mouth daily., Disp: , Rfl:  .  meloxicam (MOBIC) 15 MG tablet, TAKE 1 TABLET BY MOUTH EVERY DAY, Disp: 30 tablet, Rfl: 0 .  metaxalone (SKELAXIN) 800 MG tablet, Take 1 tablet (800 mg total) by mouth 3 (three) times daily as needed for muscle spasms., Disp: 40  tablet, Rfl: 0 .  metoprolol succinate (TOPROL-XL) 25 MG 24 hr tablet, Take 1 tablet (25 mg total) by mouth daily., Disp: 90 tablet, Rfl: 1 .  ranitidine (ZANTAC) 150 MG tablet, Take 1 tablet (150 mg total) by mouth 2 (two) times daily., Disp: 180 tablet, Rfl: 1 .  sildenafil (REVATIO) 20 MG tablet, Take prn, Disp: 50 tablet, Rfl: 0 .  fluticasone furoate-vilanterol (BREO ELLIPTA) 100-25 MCG/INH AEPB, Inhale 1 puff into the lungs daily., Disp: 60 each, Rfl: 0  No Known Allergies   ROS  Constitutional: Negative for fever or weight change.  Respiratory: Positive  for cough and shortness of breath.   Cardiovascular: Negative for chest pain or palpitations.  Gastrointestinal: Negative for abdominal pain, no bowel changes.  Musculoskeletal: Negative for gait problem or joint swelling.  Skin: very dry legs with rash Neurological: Negative for dizziness or headache.  No other specific complaints in a complete review of systems (except as listed in HPI above).  Objective  Vitals:   08/14/17 1512  BP: 108/72  Pulse: 95  Resp: 16  Temp: 97.7 F (36.5 C)  TempSrc: Oral  SpO2: 96%  Weight: 222 lb 4.8 oz (100.8 kg)  Height: 5' 8"  (1.727 m)    Body mass index is 33.8 kg/m.  Physical Exam  Constitutional: Patient appears well-developed and well-nourished. Obese No distress.  HEENT: head atraumatic, normocephalic, pupils equal and reactive to light, neck supple, throat within normal limits Cardiovascular: Normal rate, regular rhythm and normal heart sounds.  No murmur heard. No BLE  edema. Pulmonary/Chest: Effort normal and breath sounds normal. No respiratory distress. Abdominal: Soft.  There is no tenderness. Psychiatric: Patient has a normal mood and affect. behavior is normal. Judgment and thought content normal.  Recent Results (from the past 2160 hour(s))  POCT UA - Microalbumin     Status: Abnormal   Collection Time: 08/14/17  3:14 PM  Result Value Ref Range   Microalbumin Ur, POC 100 mg/L   Creatinine, POC  mg/dL   Albumin/Creatinine Ratio, Urine, POC    POCT HgB A1C     Status: None   Collection Time: 08/14/17  3:29 PM  Result Value Ref Range   Hemoglobin A1C 7.1      PHQ2/9: Depression screen [facility] 2/9 08/14/2017 07/30/2016 05/26/2016 11/16/2015 08/17/2015  Decreased Interest 0 0 0 0 0  Down, Depressed, Hopeless 0 0 0 0 0  PHQ - 2 Score 0 0 0 0 0     Fall Risk: Fall Risk  08/14/2017 07/30/2016 05/26/2016 11/16/2015 08/17/2015  Falls in the past year? No No No No No     Functional Status Survey: Is the patient deaf or have difficulty hearing?: No Does the patient have difficulty seeing, even when wearing glasses/contacts?: Yes(reading glasses) Does the patient have difficulty concentrating, remembering, or making decisions?: No Does the patient have difficulty walking or climbing stairs?: No Does the patient have difficulty dressing or bathing?: No Does the patient have difficulty doing errands alone such as visiting a doctor's office or shopping?: No   Assessment & Plan  1. Type 2 diabetes mellitus with diabetic nephropathy, without long-term current use of insulin (HCC)  - POCT HgB A1C down to 7.1% - POCT UA - Microalbumin - Dapagliflozin-metFORMIN HCl ER (XIGDUO XR) 08-998 MG TB24; Take 1 tablet by mouth 2 (two) times daily.  Dispense: 60 tablet; Refill: 3 - Insulin Degludec-Liraglutide (XULTOPHY) 100-3.6 UNIT-MG/ML SOPN; Inject 50 Units into the skin daily before breakfast. Daily  Dispense:  9 mL; Refill: 2 - lisinopril (PRINIVIL,ZESTRIL) 10 MG  tablet; Take 1 tablet (10 mg total) by mouth daily.  Dispense: 90 tablet; Refill: 1  2. Hypercholesteremia  - atorvastatin (LIPITOR) 80 MG tablet; Take 1 tablet (80 mg total) by mouth daily at 6 PM.  Dispense: 90 tablet; Refill: 1  3. Arteriosclerosis of coronary artery  - clopidogrel (PLAVIX) 75 MG tablet; Take 1 tablet (75 mg total) by mouth daily.  Dispense: 90 tablet; Refill: 1 - metoprolol succinate (TOPROL-XL) 25 MG 24 hr tablet; Take 1 tablet (25 mg total) by mouth daily.  Dispense: 90 tablet; Refill: 1  4. Dyslipidemia associated with type 2 diabetes mellitus (HCC)  - Dapagliflozin-metFORMIN HCl ER (XIGDUO XR) 08-998 MG TB24; Take 1 tablet by mouth 2 (two) times daily.  Dispense: 60 tablet; Refill: 3 - Icosapent Ethyl (VASCEPA) 1 g CAPS; Take 2 capsules (2 g total) by mouth 2 (two) times daily.  Dispense: 360 capsule; Refill: 1 - Insulin Degludec-Liraglutide (XULTOPHY) 100-3.6 UNIT-MG/ML SOPN; Inject 50 Units into the skin daily before breakfast. Daily  Dispense: 9 mL; Refill: 2  5. Hypertriglyceridemia  - Icosapent Ethyl (VASCEPA) 1 g CAPS; Take 2 capsules (2 g total) by mouth 2 (two) times daily.  Dispense: 360 capsule; Refill: 1  6. Benign hypertension  - lisinopril (PRINIVIL,ZESTRIL) 10 MG tablet; Take 1 tablet (10 mg total) by mouth daily.  Dispense: 90 tablet; Refill: 1 - metoprolol succinate (TOPROL-XL) 25 MG 24 hr tablet; Take 1 tablet (25 mg total) by mouth daily.  Dispense: 90 tablet; Refill: 1  7. Gastro-esophageal reflux disease without esophagitis  - ranitidine (ZANTAC) 150 MG tablet; Take 1 tablet (150 mg total) by mouth 2 (two) times daily.  Dispense: 180 tablet; Refill: 1  8. Bronchitis  Quit smoking 4 years ago, advised to get spirometry but he would like to hold off for now - fluticasone furoate-vilanterol (BREO ELLIPTA) 100-25 MCG/INH AEPB; Inhale 1 puff into the lungs daily.  Dispense: 60 each; Refill: 0

## 2017-08-17 ENCOUNTER — Other Ambulatory Visit: Payer: Self-pay | Admitting: Cardiovascular Disease

## 2017-08-19 ENCOUNTER — Other Ambulatory Visit: Payer: Self-pay | Admitting: Family Medicine

## 2017-08-19 DIAGNOSIS — M62838 Other muscle spasm: Secondary | ICD-10-CM

## 2017-08-24 ENCOUNTER — Other Ambulatory Visit: Payer: Self-pay | Admitting: Family Medicine

## 2017-08-24 DIAGNOSIS — M62838 Other muscle spasm: Secondary | ICD-10-CM

## 2017-09-19 ENCOUNTER — Other Ambulatory Visit: Payer: Self-pay | Admitting: Cardiovascular Disease

## 2017-09-20 ENCOUNTER — Other Ambulatory Visit: Payer: Self-pay | Admitting: Family Medicine

## 2017-09-20 DIAGNOSIS — N529 Male erectile dysfunction, unspecified: Secondary | ICD-10-CM

## 2017-09-20 DIAGNOSIS — M62838 Other muscle spasm: Secondary | ICD-10-CM

## 2017-10-16 ENCOUNTER — Other Ambulatory Visit: Payer: Self-pay | Admitting: Family Medicine

## 2017-10-16 ENCOUNTER — Other Ambulatory Visit: Payer: Self-pay | Admitting: Cardiovascular Disease

## 2017-10-16 ENCOUNTER — Telehealth: Payer: Self-pay | Admitting: Cardiovascular Disease

## 2017-10-16 DIAGNOSIS — N529 Male erectile dysfunction, unspecified: Secondary | ICD-10-CM

## 2017-10-16 NOTE — Telephone Encounter (Signed)
-----   Message from Anselm Pancoast, [place] sent at 10/16/2017 12:15 PM EDT ----- Please contact patient for a follow up for refills on medications.  Thanks, Ivin Booty

## 2017-10-16 NOTE — Telephone Encounter (Signed)
lmov to schedule appt with [facility]

## 2017-10-19 NOTE — Telephone Encounter (Signed)
Pt is scheduled 11/19/17   Please send in refills

## 2017-10-20 ENCOUNTER — Other Ambulatory Visit: Payer: Self-pay | Admitting: Family Medicine

## 2017-10-20 DIAGNOSIS — M62838 Other muscle spasm: Secondary | ICD-10-CM

## 2017-10-20 NOTE — Telephone Encounter (Signed)
Refill request was sent to Dr. Krichna Sowles for approval and submission.  

## 2017-11-19 ENCOUNTER — Ambulatory Visit: Payer: Managed Care, Other (non HMO) | Admitting: Cardiovascular Disease

## 2017-11-19 ENCOUNTER — Encounter: Payer: Self-pay | Admitting: Cardiovascular Disease

## 2017-11-19 VITALS — BP 109/78 | HR 89 | Ht 68.0 in | Wt 230.8 lb

## 2017-11-19 DIAGNOSIS — E7800 Pure hypercholesterolemia, unspecified: Secondary | ICD-10-CM

## 2017-11-19 DIAGNOSIS — I2510 Atherosclerotic heart disease of native coronary artery without angina pectoris: Secondary | ICD-10-CM

## 2017-11-19 DIAGNOSIS — G4733 Obstructive sleep apnea (adult) (pediatric): Secondary | ICD-10-CM | POA: Diagnosis not present

## 2017-11-19 DIAGNOSIS — Z720 Tobacco use: Secondary | ICD-10-CM

## 2017-11-19 DIAGNOSIS — R000 Tachycardia, unspecified: Secondary | ICD-10-CM

## 2017-11-19 DIAGNOSIS — I25118 Atherosclerotic heart disease of native coronary artery with other forms of angina pectoris: Secondary | ICD-10-CM | POA: Diagnosis not present

## 2017-11-19 DIAGNOSIS — I10 Essential (primary) hypertension: Secondary | ICD-10-CM

## 2017-11-19 DIAGNOSIS — E1121 Type 2 diabetes mellitus with diabetic nephropathy: Secondary | ICD-10-CM

## 2017-11-19 NOTE — Patient Instructions (Signed)

## 2017-11-19 NOTE — Progress Notes (Signed)
Cardiology Office Note  Date:  11/19/2017   ID:  Jesse Lawrence., DOB 24-Jun-1960, MRN [id number]  PCP:  Steele Sizer, MD   Chief Complaint  Patient presents with  . Other    12 month follow up. Patient c/o swelling in left leg. Patient denies chest pain and SOB. Meds reviewed verbally with patient.     HPI:  Jesse Lawrence is a pleasant 57 year old gentleman with history of CAD, stent x 2   2002 and 2003,  ISR in 2007, TPA (cath 3 days later) COPD, long smoking history, stopped 3 years ago HTN DM II, HBA1C 8.3, previously greater than 9 Who presents for f/u of his coronary disease,  chest pain symptoms  In follow-up todayreports that he is active Weight up, attributes that to diabetes medication Scheduled to have new lab work done Previous lab work reviewed with him HBA1C 7.1 Total chol  93 LDL 40s  No regular exercise program Works all day on his feet Legs are sore at night  Denies any shortness of breath or anginal symptoms He denies having any similar symptoms in the past 10 years  He stopped smoking 4 years ago   Works in Museum/gallery conservator, works with parts  Denies any myalgias, tolerating his Lipitor  EKG personally reviewed by myself on todays visit  shows Normal sinus rhythm rate 89 bpm no significant ST or T-wave changes   PMH:   has a past medical history of Allergy, Anxiety, COPD (chronic obstructive pulmonary disease) ([place]), Diabetes mellitus without complication ([place]), Hyperlipidemia, Hypertension, Male impotence, MI (myocardial infarction) ([place]), Obesity, and PNA (pneumonia).  PSH:    Past Surgical History:  Procedure Laterality Date  . cadiac stenting    . CYSTECTOMY     57 years of age  . KNEE ARTHROSCOPY      Current Outpatient Medications  Medication Sig Dispense Refill  . atorvastatin (LIPITOR) 80 MG tablet Take 1 tablet (80 mg total) by mouth daily at 6 PM. 90 tablet 1  . clonazePAM (KLONOPIN) 0.5 MG tablet Take 1 tablet (0.5 mg total) by mouth 2  (two) times daily as needed for anxiety. Before flights 6 tablet 0  . clopidogrel (PLAVIX) 75 MG tablet Take 1 tablet (75 mg total) by mouth daily. 90 tablet 1  . Dapagliflozin-metFORMIN HCl ER (XIGDUO XR) 08-998 MG TB24 Take 1 tablet by mouth 2 (two) times daily. 60 tablet 3  . ezetimibe (ZETIA) 10 MG tablet Take 1 tablet (10 mg total) by mouth daily. 30 tablet 11  . fluticasone (FLONASE) 50 MCG/ACT nasal spray Place 2 sprays into both nostrils as needed. 48 g 1  . fluticasone furoate-vilanterol (BREO ELLIPTA) 100-25 MCG/INH AEPB Inhale 1 puff into the lungs daily. 60 each 0  . Icosapent Ethyl (VASCEPA) 1 g CAPS Take 2 capsules (2 g total) by mouth 2 (two) times daily. 360 capsule 1  . Insulin Degludec-Liraglutide (XULTOPHY) 100-3.6 UNIT-MG/ML SOPN Inject 50 Units into the skin daily before breakfast. Daily 9 mL 2  . Insulin Pen Needle (BD PEN NEEDLE NANO U/F) 32G X 4 MM MISC Use as directed. E11.29 100 each 12  . lisinopril (PRINIVIL,ZESTRIL) 10 MG tablet Take 1 tablet (10 mg total) by mouth daily. 90 tablet 1  . loratadine (CLARITIN) 10 MG tablet Take 1 tablet by mouth daily.    . meloxicam (MOBIC) 15 MG tablet TAKE 1 TABLET BY MOUTH EVERY DAY 30 tablet 0  . metaxalone (SKELAXIN) 800 MG tablet TAKE 1 TABLET (800 MG  TOTAL) BY MOUTH 3 (THREE) TIMES DAILY AS NEEDED FOR MUSCLE SPASMS. 40 tablet 0  . metoprolol succinate (TOPROL-XL) 25 MG 24 hr tablet Take 1 tablet (25 mg total) by mouth daily. 90 tablet 1  . ranitidine (ZANTAC) 150 MG tablet Take 1 tablet (150 mg total) by mouth 2 (two) times daily. 180 tablet 1  . sildenafil (REVATIO) 20 MG tablet TAKE AS NEEDED 50 tablet 0   No current facility-administered medications for this visit.      Allergies:   Patient has no known allergies.   Social History:  The patient  reports that he quit smoking about 4 years ago. His smoking use included cigarettes. He has a 45.00 pack-year smoking history. He uses smokeless tobacco. He reports that he does  not drink alcohol or use drugs.   Family History:   family history includes CAD in his mother; Diabetes in his mother; Heart attack in his father and mother; Heart disease in his father and mother.    Review of Systems: Review of Systems  Constitutional: Negative.   Respiratory: Negative.   Cardiovascular: Negative.   Gastrointestinal: Negative.   Musculoskeletal: Negative.   Neurological: Negative.   Psychiatric/Behavioral: Negative.   All other systems reviewed and are negative.    PHYSICAL EXAM: VS:  BP 109/78 (BP Location: Left Arm, Patient Position: Sitting, Cuff Size: Normal)   Pulse 89   Ht 5' 8"  (1.727 m)   Wt 230 lb 12 oz (104.7 kg)   BMI 35.09 kg/m  , BMI Body mass index is 35.09 kg/m. Constitutional:  oriented to person, place, and time. No distress.  HENT:  Head: Normocephalic and atraumatic.  Eyes:  no discharge. No scleral icterus.  Neck: Normal range of motion. Neck supple. No JVD present.  Cardiovascular: Normal rate, regular rhythm, normal heart sounds and intact distal pulses. Exam reveals no gallop and no friction rub. No edema No murmur heard. Pulmonary/Chest: Effort normal and breath sounds normal. No stridor. No respiratory distress.  no wheezes.  no rales.  no tenderness.  Abdominal: Soft.  no distension.  no tenderness.  Musculoskeletal: Normal range of motion.  no  tenderness or deformity.  Neurological:  normal muscle tone. Coordination normal. No atrophy Skin: Skin is warm and dry. No rash noted. not diaphoretic.  Psychiatric:  normal mood and affect. behavior is normal. Thought content normal.    Recent Labs: 05/12/2017: ALT 29; BUN 19; Creat 0.83; Hemoglobin 15.9; Platelets 295; Potassium 4.5; Sodium 136    Lipid Panel Lab Results  Component Value Date   CHOL 93 05/12/2017   HDL 25 (L) 05/12/2017   LDLCALC 41 05/12/2017   TRIG 206 (H) 05/12/2017      Wt Readings from Last 3 Encounters:  11/19/17 230 lb 12 oz (104.7 kg)  08/14/17 222  lb 4.8 oz (100.8 kg)  06/30/17 218 lb 12.8 oz (99.2 kg)       ASSESSMENT AND PLAN:  Coronary artery disease of native artery of native heart with stable angina pectoris (HCC)  Currently with no anginal symptoms active, recommended, exercise program No medication changes made  History of coronary artery stent placement - Plan: EKG 12-Lead 2 stents placed with history of in-stent restenosis Maintained on Plavix Felt he was bleeding/Bruising with both aspirin and Plavix stable  Hypercholesteremia Recommended he stay on his Lipitor 80 mg daily, had Zetia 10 mg daily Goal LDL less than 70 Numbers discussed with him in detail  Benign hypertension Blood pressure is well controlled  on today's visit. No changes made to the medications. stable  Hypertriglyceridemia Discussed diet, recommended cutting back on pasta  OSA (obstructive sleep apnea) We have encouraged continued exercise, careful diet management in an effort to lose weight. Stable  Tobacco abuse Quit smoking 3 years ago Underlying COPD  Sinus tachycardia Rate improved on recheck Unable to advance his beta blocker given borderline blood pressure  Disposition:   F/U one year   Total encounter time more than 25 minutes  Greater than 50% was spent in counseling and coordination of care with the patient   Orders Placed This Encounter  Procedures  . EKG 12-Lead     Signed, Esmond Plants, M.D., Ph.D. 11/19/2017  [place], [place]

## 2017-11-20 ENCOUNTER — Ambulatory Visit: Payer: Managed Care, Other (non HMO) | Admitting: Family Medicine

## 2017-11-20 ENCOUNTER — Encounter: Payer: Self-pay | Admitting: Family Medicine

## 2017-11-20 VITALS — BP 116/70 | HR 84 | Temp 98.2°F | Ht 68.0 in | Wt 230.0 lb

## 2017-11-20 DIAGNOSIS — R21 Rash and other nonspecific skin eruption: Secondary | ICD-10-CM | POA: Diagnosis not present

## 2017-11-20 DIAGNOSIS — E781 Pure hyperglyceridemia: Secondary | ICD-10-CM | POA: Diagnosis not present

## 2017-11-20 DIAGNOSIS — E1169 Type 2 diabetes mellitus with other specified complication: Secondary | ICD-10-CM

## 2017-11-20 DIAGNOSIS — E1121 Type 2 diabetes mellitus with diabetic nephropathy: Secondary | ICD-10-CM | POA: Diagnosis not present

## 2017-11-20 DIAGNOSIS — E785 Hyperlipidemia, unspecified: Secondary | ICD-10-CM

## 2017-11-20 DIAGNOSIS — I252 Old myocardial infarction: Secondary | ICD-10-CM

## 2017-11-20 DIAGNOSIS — K7581 Nonalcoholic steatohepatitis (NASH): Secondary | ICD-10-CM

## 2017-11-20 LAB — POCT GLYCOSYLATED HEMOGLOBIN (HGB A1C): Hemoglobin A1C: 7.1 % — AB (ref 4.0–5.6)

## 2017-11-20 MED ORDER — INSULIN DEGLUDEC-LIRAGLUTIDE 100-3.6 UNIT-MG/ML ~~LOC~~ SOPN: 50.0000 [IU] | PEN_INJECTOR | Freq: Every day | SUBCUTANEOUS | 2 refills | Status: DC

## 2017-11-20 MED ORDER — ICOSAPENT ETHYL 1 G PO CAPS: 2.0000 | ORAL_CAPSULE | Freq: Two times a day (BID) | ORAL | 1 refills | Status: DC

## 2017-11-20 MED ORDER — DAPAGLIFLOZIN PRO-METFORMIN ER 5-1000 MG PO TB24: 1.0000 | ORAL_TABLET | Freq: Two times a day (BID) | ORAL | 3 refills | Status: DC

## 2017-11-20 MED ORDER — TRIAMCINOLONE ACETONIDE 0.1 % EX CREA: 1.0000 "application " | TOPICAL_CREAM | Freq: Two times a day (BID) | CUTANEOUS | 0 refills | Status: DC

## 2017-11-20 NOTE — Progress Notes (Signed)
Name: Jesse Lawrence.   MRN: [id number]    DOB: 11-24-1960   Date:11/20/2017       Progress Note  Subjective  Chief Complaint  Chief Complaint  Patient presents with  . Medication Refill  . Hypertension  . Diabetes  . Gastroesophageal Reflux  . Erectile Dysfunction  . Sleep Apnea  . back spasm    HPI  DM II : HispasthgbA1C 9.8% to 7.9%, up to 9.1% ,9.6% down to7.8 %, while at [facility] up to 8.3%,8.3%,8.2% since on Xultophy - started 05/2017 hgbA1C 7.1 % and today is 7.1% again.  He is still on Xigduo also and denies side effects of medications. He states he has gained weight, but has moved and is eating mostly fast food. He has microalbuminuria and is on ACE. He has dyslipidemia, and is taking mediation. Last triglycerides was much better, but has been out of Vascepa.   HTN/CAD: no chest pain, no palpitation, bp is towards low end of normal no side effects of medication, including no dizziness. He denies decrease in exercise tolerance. He is on Plavix, statin and Zetia., Vascepa ( he has been out - he states he gets it from [place] because of cost and forgets to go back)   GERD: he is off Omeprazole, he is off energy drinks,he states controlled now with Ranitidine prn.  ED: he has difficulty maintaining and erection, uses Cialis prn, he is doing well on Sildenafil prn. Unchanged   OSA: he has not been compliant with CPAP machine, he cannot tolerated. I  explained importance of compliance with therapy.   Back spasms: he is doing well now, he states Baclofen stopped working, but was given Skelaxin and is doing well at this time.Marland Kitchen He takes medication prn only. He states related to work  Rash: he noticed a rash on both upper extremities, wife has poison oak rash, he states some area is itchy, he has been mowing the yard and working outside on their new place.   Morbid Obesity: heis now eating healthier, he gained about 8 lbs since last visit, he needs to stop fast food     Patient Active Problem List   Diagnosis Date Noted  . OSA (obstructive sleep apnea) 05/21/2015  . Perennial allergic rhinitis 11/07/2014  . Arteriosclerosis of coronary artery 11/07/2014  . CD (contact dermatitis) 11/07/2014  . Decreased libido 11/07/2014  . Diabetes mellitus with renal manifestation ([place]) 11/07/2014  . Gastro-esophageal reflux disease without esophagitis 11/07/2014  . H/O acute myocardial infarction 11/07/2014  . Hypercholesteremia 11/07/2014  . Benign hypertension 11/07/2014  . Hypertriglyceridemia 11/07/2014  . H/O high risk medication treatment 11/07/2014  . NASH (nonalcoholic steatohepatitis) 11/07/2014  . Microalbuminuria 11/07/2014  . Adult BMI 30+ 11/07/2014  . Fungal infection of toenail 11/07/2014  . Tobacco abuse 11/07/2014  . ED (erectile dysfunction) of organic origin 09/29/2006    Past Surgical History:  Procedure Laterality Date  . cadiac stenting    . CYSTECTOMY     57 years of age  . KNEE ARTHROSCOPY      Family History  Problem Relation Age of Onset  . Diabetes Mother   . CAD Mother   . Heart disease Mother   . Heart attack Mother   . Heart disease Father   . Heart attack Father     Social History   Socioeconomic History  . Marital status: Married    Spouse name: Not on file  . Number of children: Not on file  . Years  of education: Not on file  . Highest education level: Not on file  Occupational History  . Not on file  Social Needs  . Financial resource strain: Not on file  . Food insecurity:    Worry: Not on file    Inability: Not on file  . Transportation needs:    Medical: Not on file    Non-medical: Not on file  Tobacco Use  . Smoking status: Former Smoker    Packs/day: 1.50    Years: 30.00    Pack years: 45.00    Types: Cigarettes    Last attempt to quit: 04/19/2013    Years since quitting: 4.5  . Smokeless tobacco: Current User  Substance and Sexual Activity  . Alcohol use: No    Alcohol/week: 0.0  standard drinks    Frequency: Never    Comment: occ  . Drug use: No  . Sexual activity: Yes    Partners: Female  Lifestyle  . Physical activity:    Days per week: Not on file    Minutes per session: Not on file  . Stress: Not on file  Relationships  . Social connections:    Talks on phone: Not on file    Gets together: Not on file    Attends religious service: Not on file    Active member of club or organization: Not on file    Attends meetings of clubs or organizations: Not on file    Relationship status: Not on file  . Intimate partner violence:    Fear of current or ex partner: Not on file    Emotionally abused: Not on file    Physically abused: Not on file    Forced sexual activity: Not on file  Other Topics Concern  . Not on file  Social History Narrative  . Not on file     Current Outpatient Medications:  .  atorvastatin (LIPITOR) 80 MG tablet, Take 1 tablet (80 mg total) by mouth daily at 6 PM., Disp: 90 tablet, Rfl: 1 .  clonazePAM (KLONOPIN) 0.5 MG tablet, Take 1 tablet (0.5 mg total) by mouth 2 (two) times daily as needed for anxiety. Before flights, Disp: 6 tablet, Rfl: 0 .  clopidogrel (PLAVIX) 75 MG tablet, Take 1 tablet (75 mg total) by mouth daily., Disp: 90 tablet, Rfl: 1 .  Dapagliflozin-metFORMIN HCl ER (XIGDUO XR) 08-998 MG TB24, Take 1 tablet by mouth 2 (two) times daily., Disp: 60 tablet, Rfl: 3 .  ezetimibe (ZETIA) 10 MG tablet, Take 1 tablet (10 mg total) by mouth daily., Disp: 30 tablet, Rfl: 11 .  fluticasone (FLONASE) 50 MCG/ACT nasal spray, Place 2 sprays into both nostrils as needed., Disp: 48 g, Rfl: 1 .  fluticasone furoate-vilanterol (BREO ELLIPTA) 100-25 MCG/INH AEPB, Inhale 1 puff into the lungs daily., Disp: 60 each, Rfl: 0 .  Insulin Degludec-Liraglutide (XULTOPHY) 100-3.6 UNIT-MG/ML SOPN, Inject 50 Units into the skin daily before breakfast. Daily, Disp: 9 mL, Rfl: 2 .  Insulin Pen Needle (BD PEN NEEDLE NANO U/F) 32G X 4 MM MISC, Use as  directed. E11.29, Disp: 100 each, Rfl: 12 .  lisinopril (PRINIVIL,ZESTRIL) 10 MG tablet, Take 1 tablet (10 mg total) by mouth daily., Disp: 90 tablet, Rfl: 1 .  loratadine (CLARITIN) 10 MG tablet, Take 1 tablet by mouth daily., Disp: , Rfl:  .  meloxicam (MOBIC) 15 MG tablet, TAKE 1 TABLET BY MOUTH EVERY DAY, Disp: 30 tablet, Rfl: 0 .  metaxalone (SKELAXIN) 800 MG tablet, TAKE 1  TABLET (800 MG TOTAL) BY MOUTH 3 (THREE) TIMES DAILY AS NEEDED FOR MUSCLE SPASMS., Disp: 40 tablet, Rfl: 0 .  metoprolol succinate (TOPROL-XL) 25 MG 24 hr tablet, Take 1 tablet (25 mg total) by mouth daily., Disp: 90 tablet, Rfl: 1 .  ranitidine (ZANTAC) 150 MG tablet, Take 1 tablet (150 mg total) by mouth 2 (two) times daily., Disp: 180 tablet, Rfl: 1 .  sildenafil (REVATIO) 20 MG tablet, TAKE AS NEEDED, Disp: 50 tablet, Rfl: 0 .  Icosapent Ethyl (VASCEPA) 1 g CAPS, Take 2 capsules (2 g total) by mouth 2 (two) times daily. (Patient not taking: Reported on 11/20/2017), Disp: 360 capsule, Rfl: 1  No Known Allergies   ROS  Constitutional: Negative for fever, positive for  weight change.  Respiratory: Negative for cough and shortness of breath.   Cardiovascular: Negative for chest pain or palpitations.  Gastrointestinal: Negative for abdominal pain, no bowel changes.  Musculoskeletal: Negative for gait problem or joint swelling.  Skin: Positive  for rash.  Neurological: Negative for dizziness or headache.  No other specific complaints in a complete review of systems (except as listed in HPI above).  Objective  Vitals:   11/20/17 0946  BP: 116/70  Pulse: 84  Temp: 98.2 F (36.8 C)  TempSrc: Oral  SpO2: 94%  Weight: 230 lb (104.3 kg)  Height: 5' 8"  (1.727 m)    Body mass index is 34.97 kg/m.  Physical Exam  Constitutional: Patient appears well-developed and well-nourished. Obese No distress.  HEENT: head atraumatic, normocephalic, pupils equal and reactive to light,  neck supple, throat within normal  limits Cardiovascular: Normal rate, regular rhythm and normal heart sounds.  No murmur heard. No BLE edema. Pulmonary/Chest: Effort normal and breath sounds normal. No respiratory distress. Abdominal: Soft.  There is no tenderness. Skin: erythematous rash on both arms Psychiatric: Patient has a normal mood and affect. behavior is normal. Judgment and thought content normal.  Recent Results (from the past 2160 hour(s))  POCT glycosylated hemoglobin (Hb A1C)     Status: Abnormal   Collection Time: 11/20/17 10:02 AM  Result Value Ref Range   Hemoglobin A1C 7.1 (A) 4.0 - 5.6 %   HbA1c POC (<> result, manual entry)     HbA1c, POC (prediabetic range)     HbA1c, POC (controlled diabetic range)        PHQ2/9: Depression screen [facility] 2/9 11/20/2017 08/14/2017 07/30/2016 05/26/2016 11/16/2015  Decreased Interest 0 0 0 0 0  Down, Depressed, Hopeless 0 0 0 0 0  PHQ - 2 Score 0 0 0 0 0     Fall Risk: Fall Risk  11/20/2017 08/14/2017 07/30/2016 05/26/2016 11/16/2015  Falls in the past year? No No No No No     Functional Status Survey: Is the patient deaf or have difficulty hearing?: No Does the patient have difficulty seeing, even when wearing glasses/contacts?: No Does the patient have difficulty concentrating, remembering, or making decisions?: No Does the patient have difficulty walking or climbing stairs?: No Does the patient have difficulty dressing or bathing?: No Does the patient have difficulty doing errands alone such as visiting a doctor's office or shopping?: No    Assessment & Plan  1. Type 2 diabetes mellitus with diabetic nephropathy, without long-term current use of insulin (HCC)  - POCT glycosylated hemoglobin (Hb A1C) - Dapagliflozin-metFORMIN HCl ER (XIGDUO XR) 08-998 MG TB24; Take 1 tablet by mouth 2 (two) times daily.  Dispense: 60 tablet; Refill: 3 - Insulin Degludec-Liraglutide (XULTOPHY) 100-3.6 UNIT-MG/ML  SOPN; Inject 50 Units into the skin daily before breakfast. Daily   Dispense: 9 mL; Refill: 2  2. Dyslipidemia associated with type 2 diabetes mellitus (HCC)  - Dapagliflozin-metFORMIN HCl ER (XIGDUO XR) 08-998 MG TB24; Take 1 tablet by mouth 2 (two) times daily.  Dispense: 60 tablet; Refill: 3 - Insulin Degludec-Liraglutide (XULTOPHY) 100-3.6 UNIT-MG/ML SOPN; Inject 50 Units into the skin daily before breakfast. Daily  Dispense: 9 mL; Refill: 2 - Icosapent Ethyl (VASCEPA) 1 g CAPS; Take 2 capsules (2 g total) by mouth 2 (two) times daily.  Dispense: 360 capsule; Refill: 1  3. Rash  - triamcinolone cream (KENALOG) 0.1 %; Apply 1 application topically 2 (two) times daily.  Dispense: 80 g; Refill: 0  4. Hypertriglyceridemia  - Icosapent Ethyl (VASCEPA) 1 g CAPS; Take 2 capsules (2 g total) by mouth 2 (two) times daily.  Dispense: 360 capsule; Refill: 1  5. NASH (nonalcoholic steatohepatitis)  Needs to resume Vascepa   6. H/O acute myocardial infarction

## 2017-11-24 ENCOUNTER — Other Ambulatory Visit: Payer: Self-pay | Admitting: Cardiovascular Disease

## 2017-11-24 ENCOUNTER — Other Ambulatory Visit: Payer: Self-pay | Admitting: Family Medicine

## 2017-11-24 DIAGNOSIS — M62838 Other muscle spasm: Secondary | ICD-10-CM

## 2017-12-27 ENCOUNTER — Other Ambulatory Visit: Payer: Self-pay | Admitting: Family Medicine

## 2017-12-27 DIAGNOSIS — E1121 Type 2 diabetes mellitus with diabetic nephropathy: Secondary | ICD-10-CM

## 2017-12-27 DIAGNOSIS — E785 Hyperlipidemia, unspecified: Secondary | ICD-10-CM

## 2017-12-27 DIAGNOSIS — E1169 Type 2 diabetes mellitus with other specified complication: Secondary | ICD-10-CM

## 2017-12-29 ENCOUNTER — Other Ambulatory Visit: Payer: Self-pay | Admitting: Family Medicine

## 2017-12-29 DIAGNOSIS — M62838 Other muscle spasm: Secondary | ICD-10-CM

## 2018-01-04 IMAGING — CR DG CHEST 2V
2 series · 2 of 2 positions shown · non-contrast
Comparison: Chest radiograph performed 03/12/2011

CLINICAL DATA: Subacute onset of productive cough. Midsternal chest
pain and fever. Initial encounter.

EXAM:
CHEST  2 VIEW

[chest pa]
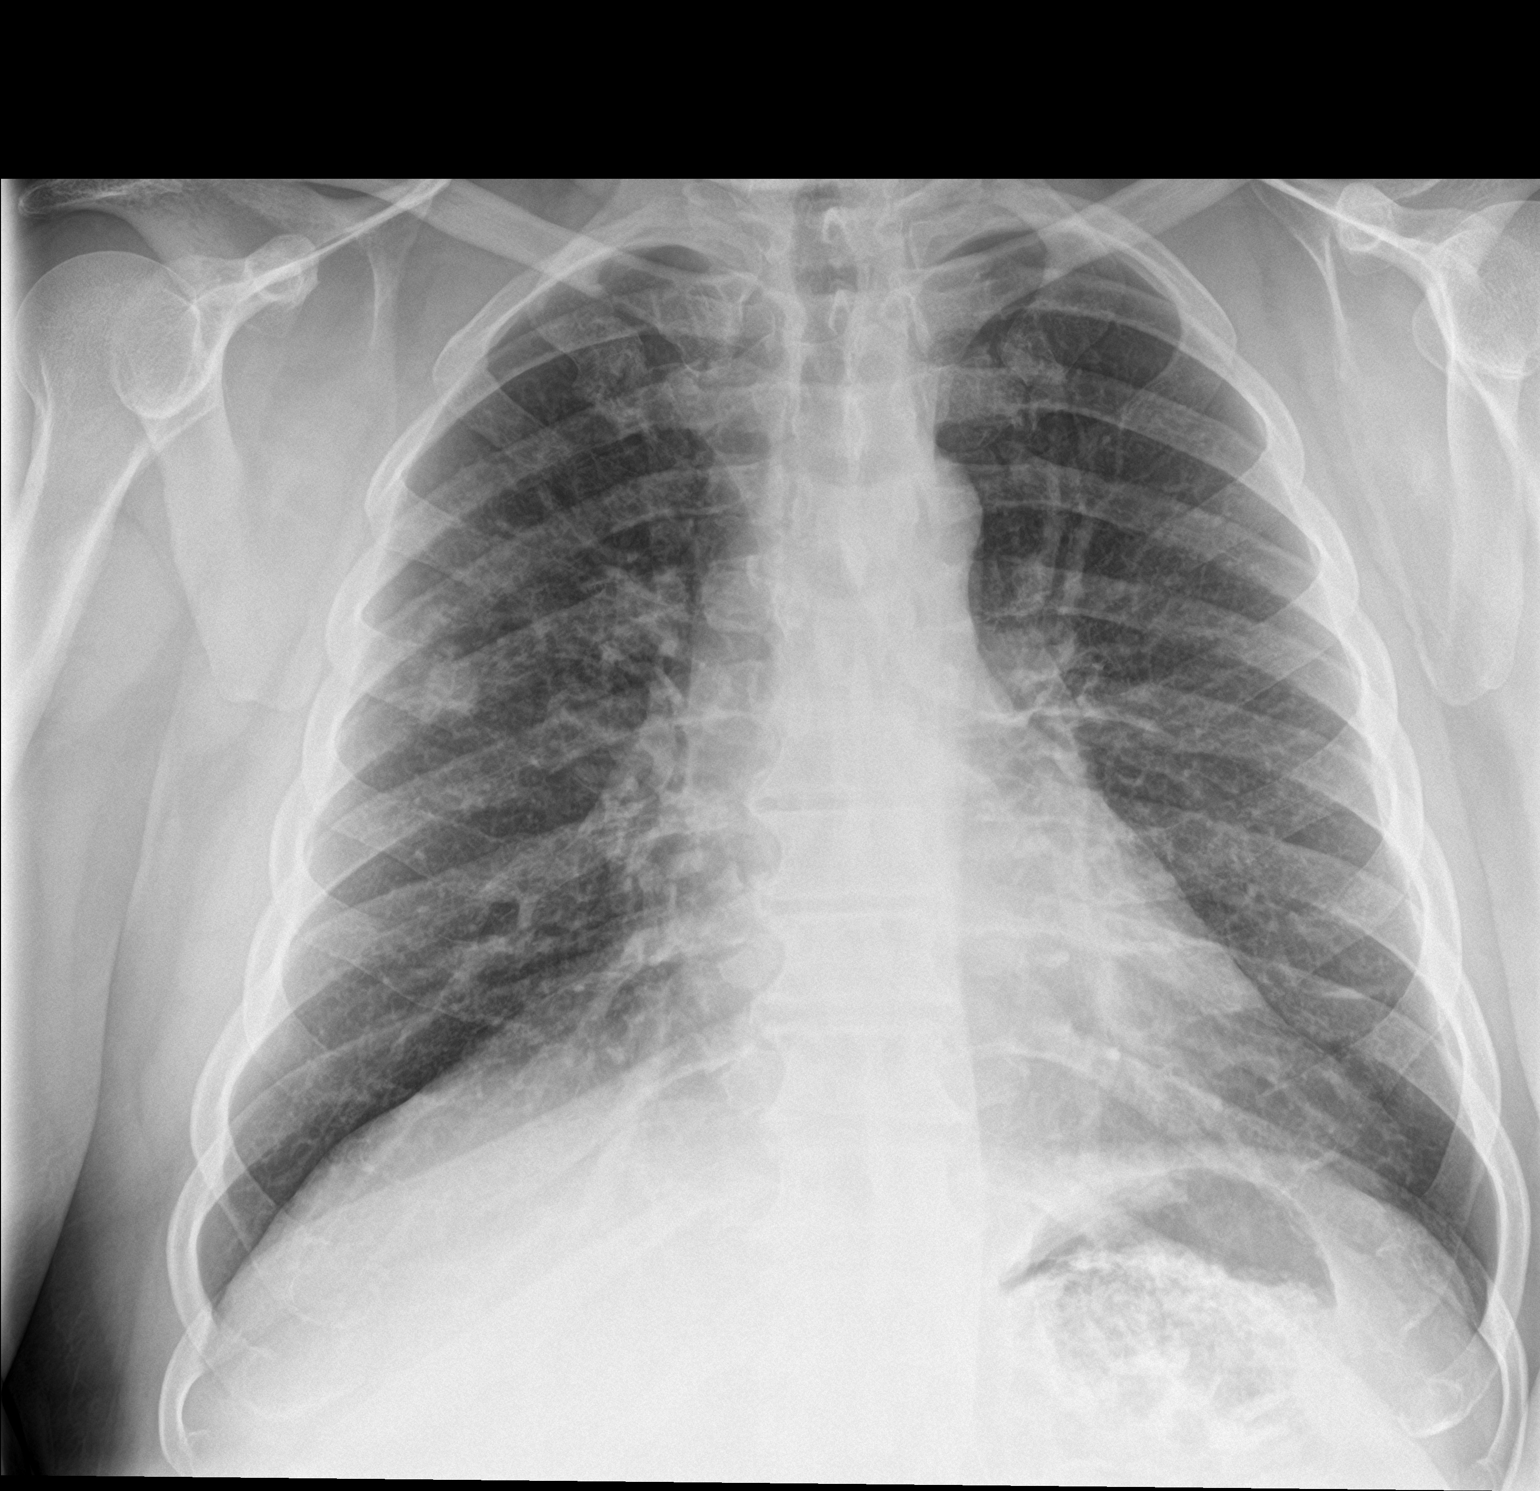

[chest lat]
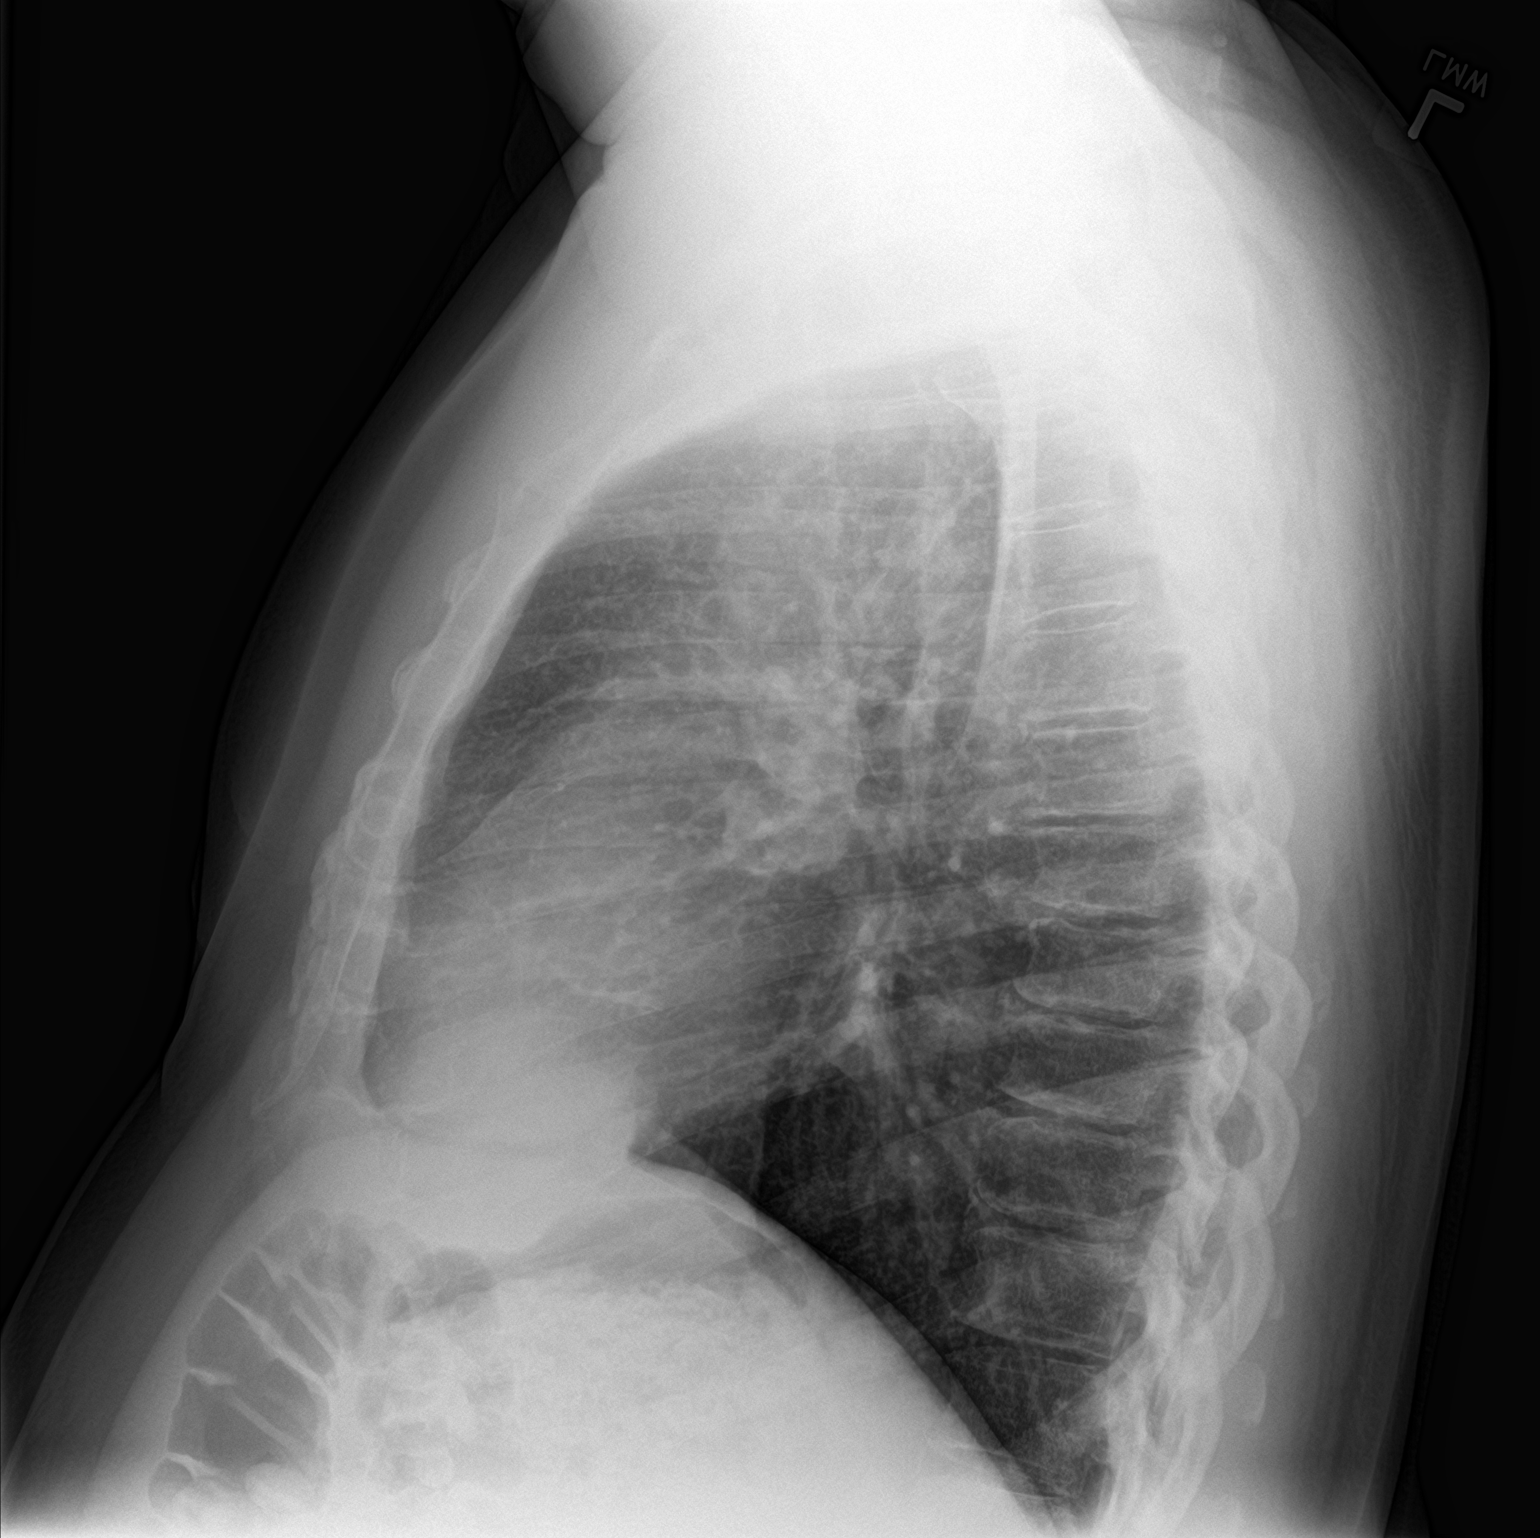

[2 of 2 positions shown; findings below may reference images not displayed]

FINDINGS: The lungs are well-aerated. Minimal right midlung and left basilar
opacities may reflect atelectasis or possibly mild infection. There
is no evidence of pleural effusion or pneumothorax.

The heart is normal in size; the mediastinal contour is within
normal limits. No acute osseous abnormalities are seen.
IMPRESSION: Minimal right midlung and left basilar airspace opacities may
reflect atelectasis or possibly mild infection.

## 2018-01-19 ENCOUNTER — Telehealth: Payer: Self-pay

## 2018-01-19 NOTE — Telephone Encounter (Signed)
Copied from [place] [id number]. Topic: General - Other >> Jan 19, 2018  9:27 AM Janace Aris A wrote: Reason for CRM: Pt called In wanting to know if he could get a medication called in to the pharmacy, he says he can not wait to see Dr. Ancil Boozer in November, and does not want to schedule with another provider. Patient says he has a very bad cough, and sinus draining. He says if he does not get a call today he will most likely go to urgent care.  Are you willing to call this patient in something to take or should he go to the urgent care?

## 2018-01-20 ENCOUNTER — Other Ambulatory Visit: Payer: Self-pay | Admitting: Family Medicine

## 2018-01-20 DIAGNOSIS — N529 Male erectile dysfunction, unspecified: Secondary | ICD-10-CM

## 2018-01-20 NOTE — Telephone Encounter (Signed)
Sorry, urgent care or NP in our office.

## 2018-01-21 ENCOUNTER — Telehealth: Payer: Self-pay

## 2018-01-21 NOTE — Telephone Encounter (Signed)
Called patient regarding the call on 01/19/18 to see if he would like to come in today but he stated that he was taking Mucinex and it was working so he declined and office visit.

## 2018-01-23 ENCOUNTER — Other Ambulatory Visit: Payer: Self-pay | Admitting: Family Medicine

## 2018-01-23 DIAGNOSIS — E781 Pure hyperglyceridemia: Secondary | ICD-10-CM

## 2018-01-23 DIAGNOSIS — E1169 Type 2 diabetes mellitus with other specified complication: Secondary | ICD-10-CM

## 2018-01-23 DIAGNOSIS — E785 Hyperlipidemia, unspecified: Secondary | ICD-10-CM

## 2018-01-26 ENCOUNTER — Other Ambulatory Visit: Payer: Self-pay | Admitting: Family Medicine

## 2018-01-26 DIAGNOSIS — M62838 Other muscle spasm: Secondary | ICD-10-CM

## 2018-02-19 ENCOUNTER — Other Ambulatory Visit: Payer: Self-pay | Admitting: Family Medicine

## 2018-02-19 DIAGNOSIS — M62838 Other muscle spasm: Secondary | ICD-10-CM

## 2018-02-19 DIAGNOSIS — E1121 Type 2 diabetes mellitus with diabetic nephropathy: Secondary | ICD-10-CM

## 2018-02-19 DIAGNOSIS — I10 Essential (primary) hypertension: Secondary | ICD-10-CM

## 2018-02-19 NOTE — Telephone Encounter (Signed)
Hypertension medication request: Lisinopril and Meloxicam to [facility]  Last office visit pertaining to hypertension: 11/20/2017   BP Readings from Last 3 Encounters:  11/20/17 116/70  11/19/17 109/78  08/14/17 108/72    Lab Results  Component Value Date   CREATININE 0.83 05/12/2017   BUN 19 05/12/2017   NA 136 05/12/2017   K 4.5 05/12/2017   CL 102 05/12/2017   CO2 26 05/12/2017    Follow up on 02/26/2018

## 2018-02-22 ENCOUNTER — Ambulatory Visit: Payer: Managed Care, Other (non HMO) | Admitting: Family Medicine

## 2018-02-26 ENCOUNTER — Encounter: Payer: Self-pay | Admitting: Family Medicine

## 2018-02-26 ENCOUNTER — Ambulatory Visit: Payer: Managed Care, Other (non HMO) | Admitting: Family Medicine

## 2018-02-26 VITALS — BP 118/72 | HR 90 | Resp 16 | Ht 68.0 in | Wt 229.1 lb

## 2018-02-26 DIAGNOSIS — I10 Essential (primary) hypertension: Secondary | ICD-10-CM

## 2018-02-26 DIAGNOSIS — Z0000 Encounter for general adult medical examination without abnormal findings: Secondary | ICD-10-CM

## 2018-02-26 DIAGNOSIS — I2510 Atherosclerotic heart disease of native coronary artery without angina pectoris: Secondary | ICD-10-CM

## 2018-02-26 DIAGNOSIS — E1169 Type 2 diabetes mellitus with other specified complication: Secondary | ICD-10-CM | POA: Diagnosis not present

## 2018-02-26 DIAGNOSIS — R351 Nocturia: Secondary | ICD-10-CM

## 2018-02-26 DIAGNOSIS — Z23 Encounter for immunization: Secondary | ICD-10-CM

## 2018-02-26 DIAGNOSIS — I252 Old myocardial infarction: Secondary | ICD-10-CM

## 2018-02-26 DIAGNOSIS — E7800 Pure hypercholesterolemia, unspecified: Secondary | ICD-10-CM

## 2018-02-26 DIAGNOSIS — N529 Male erectile dysfunction, unspecified: Secondary | ICD-10-CM

## 2018-02-26 DIAGNOSIS — K7581 Nonalcoholic steatohepatitis (NASH): Secondary | ICD-10-CM

## 2018-02-26 DIAGNOSIS — Z79899 Other long term (current) drug therapy: Secondary | ICD-10-CM

## 2018-02-26 DIAGNOSIS — G4733 Obstructive sleep apnea (adult) (pediatric): Secondary | ICD-10-CM

## 2018-02-26 DIAGNOSIS — E785 Hyperlipidemia, unspecified: Secondary | ICD-10-CM

## 2018-02-26 DIAGNOSIS — E1121 Type 2 diabetes mellitus with diabetic nephropathy: Secondary | ICD-10-CM | POA: Diagnosis not present

## 2018-02-26 DIAGNOSIS — K219 Gastro-esophageal reflux disease without esophagitis: Secondary | ICD-10-CM

## 2018-02-26 DIAGNOSIS — N401 Enlarged prostate with lower urinary tract symptoms: Secondary | ICD-10-CM

## 2018-02-26 LAB — POCT GLYCOSYLATED HEMOGLOBIN (HGB A1C): Hemoglobin A1C: 7.4 % — AB (ref 4.0–5.6)

## 2018-02-26 MED ORDER — INSULIN DEGLUDEC-LIRAGLUTIDE 100-3.6 UNIT-MG/ML ~~LOC~~ SOPN: 50.0000 [IU] | PEN_INJECTOR | Freq: Every day | SUBCUTANEOUS | 1 refills | Status: DC

## 2018-02-26 MED ORDER — DAPAGLIFLOZIN PRO-METFORMIN ER 5-1000 MG PO TB24: 1.0000 | ORAL_TABLET | Freq: Two times a day (BID) | ORAL | 5 refills | Status: DC

## 2018-02-26 MED ORDER — FAMOTIDINE 20 MG PO TABS: 20.0000 mg | ORAL_TABLET | Freq: Two times a day (BID) | ORAL | 5 refills | Status: DC

## 2018-02-26 MED ORDER — METOPROLOL SUCCINATE ER 25 MG PO TB24: 25.0000 mg | ORAL_TABLET | Freq: Every day | ORAL | 5 refills | Status: DC

## 2018-02-26 MED ORDER — CLOPIDOGREL BISULFATE 75 MG PO TABS: 75.0000 mg | ORAL_TABLET | Freq: Every day | ORAL | 5 refills | Status: DC

## 2018-02-26 MED ORDER — ATORVASTATIN CALCIUM 80 MG PO TABS: 80.0000 mg | ORAL_TABLET | Freq: Every day | ORAL | 5 refills | Status: DC

## 2018-02-26 NOTE — Progress Notes (Signed)
Name: Jesse Lawrence.   MRN: [id number]    DOB: Jan 15, 1961   Date:02/26/2018       Progress Note  Subjective  Chief Complaint  Chief Complaint  Patient presents with  . Annual Exam  . Diabetes  . Hypertriglyceridemia  . Arm Pain    left arm pain for about a month or so ago  . Spasms    abdominal spasms  . Cough    patient stated that he has been doing a lot of coughin lately. productive - clear.     HPI  Patient presents for annual CPE and follow up  DM II : HispasthgbA1C 9.8% to 7.9%, up to 9.1% ,9.6% down to7.8 %, while at [facility] up to 8.3%,8.3%,8.2%since on Xultophy - started 05/2017 hgbA1C 7.1 %,7.1% and today is 7.4%  He is still on Xigduo also and denies side effects of medications. He is not compliant with diet lately, but states he will resume it. He has microalbuminuria and is on ACE. He has dyslipidemia, and is taking mediation. Last triglycerides was much better, on Vascepa and statin therapy.   HTN/CAD: no chest pain, no palpitation, bp istowards low end of normalno side effects of medication, including no dizziness. He denies decrease in exercise tolerance. He is on Plavix, statin and Zetia., Vascepa. Still sees Dr. Nehemiah Massed  GERD: he is off Omeprazole, he is off energy drinks,he is taking Ranitidine but states has postnasal drainage, tickle on his throat and a dry cough, he is afraid of going back on PPI and does not want to see ENT or GI at this time  ED: he has difficulty maintaining and erection, uses Cialis prn, he is doing well on Sildenafilprn. Unchanged   OSA: he has not been compliant with CPAP machine, he cannot tolerated. I  explained importance of compliance with therapy. Unchanged   Back spasms: he is doing well now, he states Baclofen stopped working, but was given Skelaxin and is doing wellat this time.Marland Kitchen He takes medication prn only. Stable   Left shoulder pain: he noticed difficulty doing push ups because it causes upper arm pain,  normal rom of shoulder,   USPSTF grade A and B recommendations:  Diet: not very compliant with diabetic lately, eating candy    Depression:  Depression screen [facility] 2/9 02/26/2018 11/20/2017 08/14/2017 07/30/2016 05/26/2016  Decreased Interest 0 0 0 0 0  Down, Depressed, Hopeless 0 0 0 0 0  PHQ - 2 Score 0 0 0 0 0  Altered sleeping 0 - - - -  Tired, decreased energy 0 - - - -  Change in appetite 0 - - - -  Feeling bad or failure about yourself  0 - - - -  Trouble concentrating 0 - - - -  Moving slowly or fidgety/restless 0 - - - -  Suicidal thoughts 0 - - - -  PHQ-9 Score 0 - - - -  Difficult doing work/chores Not difficult at all - - - -    Hypertension:  BP Readings from Last 3 Encounters:  02/26/18 118/72  11/20/17 116/70  11/19/17 109/78    Obesity: Wt Readings from Last 3 Encounters:  02/26/18 229 lb 1.6 oz (103.9 kg)  11/20/17 230 lb (104.3 kg)  11/19/17 230 lb 12 oz (104.7 kg)   BMI Readings from Last 3 Encounters:  02/26/18 34.83 kg/m  11/20/17 34.97 kg/m  11/19/17 35.09 kg/m     Lipids:  Lab Results  Component Value Date   CHOL  93 05/12/2017   CHOL 169 05/26/2016   CHOL 150 08/17/2015   Lab Results  Component Value Date   HDL 25 (L) 05/12/2017   HDL 26 (L) 05/26/2016   HDL 24 (L) 08/17/2015   Lab Results  Component Value Date   LDLCALC 41 05/12/2017   LDLCALC NOT CALC 05/26/2016   [place] Comment 08/17/2015   Lab Results  Component Value Date   TRIG 206 (H) 05/12/2017   TRIG 537 (H) 05/26/2016   TRIG 453 (H) 08/17/2015   Lab Results  Component Value Date   CHOLHDL 3.7 05/12/2017   CHOLHDL 6.5 (H) 05/26/2016   CHOLHDL 6.3 (H) 08/17/2015   No results found for: LDLDIRECT Glucose:  Glucose  Date Value Ref Range Status  08/02/2011 171 (H) 65 - 99 mg/dL Final   Glucose, Bld  Date Value Ref Range Status  05/12/2017 143 (H) 65 - 139 mg/dL Final    Comment:    .        Non-fasting reference interval .   07/23/2016 396 (H) 65 - 99  mg/dL Final  05/26/2016 124 (H) 65 - 99 mg/dL Final   Glucose-Capillary  Date Value Ref Range Status  07/24/2016 121 (H) 65 - 99 mg/dL Final  07/24/2016 155 (H) 65 - 99 mg/dL Final  07/24/2016 127 (H) 65 - 99 mg/dL Final      Office Visit from 02/26/2018 in [facility]  AUDIT-C Score  0      Married STD testing and prevention (HIV/chl/gon/syphilis): N/A Hep C: up to date   Skin cancer: discussed atypical lesions Colorectal cancer: up to date  Prostate cancer: PSA, history of BPH  IPSS Questionnaire (AUA-7): Over the past month.   1)  How often have you had a sensation of not emptying your bladder completely after you finish urinating?  1 - Less than 1 time in 5  2)  How often have you had to urinate again less than two hours after you finished urinating? 1 - Less than 1 time in 5  3)  How often have you found you stopped and started again several times when you urinated?  1 - Less than 1 time in 5  4) How difficult have you found it to postpone urination?  0 - Not at all  5) How often have you had a weak urinary stream?  2 - Less than half the time  6) How often have you had to push or strain to begin urination?  0 - Not at all  7) How many times did you most typically get up to urinate from the time you went to bed until the time you got up in the morning?  1 - 1 time  Total score:  0-7 mildly symptomatic   8-19 moderately symptomatic   20-35 severely symptomatic    Lung cancer:  Low Dose CT Chest recommended if Age 9-80 years, 30 pack-year currently smoking OR have quit w/in 15years. Patient does qualify.  He is not interested  ECG:  Sees cardiologist   Advanced Care Planning: A voluntary discussion about advance care planning including the explanation and discussion of advance directives.  Discussed health care proxy and Living will, and the patient was able to identify a health care proxy as wife .  Patient does not have a living will at present  time.  Patient Active Problem List   Diagnosis Date Noted  . OSA (obstructive sleep apnea) 05/21/2015  . Perennial allergic rhinitis 11/07/2014  .  Arteriosclerosis of coronary artery 11/07/2014  . CD (contact dermatitis) 11/07/2014  . Decreased libido 11/07/2014  . Diabetes mellitus with renal manifestation ([place]) 11/07/2014  . Gastro-esophageal reflux disease without esophagitis 11/07/2014  . H/O acute myocardial infarction 11/07/2014  . Hypercholesteremia 11/07/2014  . Benign hypertension 11/07/2014  . Hypertriglyceridemia 11/07/2014  . H/O high risk medication treatment 11/07/2014  . NASH (nonalcoholic steatohepatitis) 11/07/2014  . Microalbuminuria 11/07/2014  . Adult BMI 30+ 11/07/2014  . Fungal infection of toenail 11/07/2014  . Tobacco abuse 11/07/2014  . ED (erectile dysfunction) of organic origin 09/29/2006    Past Surgical History:  Procedure Laterality Date  . cadiac stenting    . CYSTECTOMY     57 years of age  . KNEE ARTHROSCOPY      Family History  Problem Relation Age of Onset  . Diabetes Mother   . CAD Mother   . Heart disease Mother   . Heart attack Mother   . Heart disease Father   . Heart attack Father     Social History   Socioeconomic History  . Marital status: Married    Spouse name: Joseph Art  . Number of children: 4  . Years of education: Not on file  . Highest education level: Not on file  Occupational History  . Not on file  Social Needs  . Financial resource strain: Not hard at all  . Food insecurity:    Worry: Never true    Inability: Never true  . Transportation needs:    Medical: No    Non-medical: No  Tobacco Use  . Smoking status: Former Smoker    Packs/day: 1.50    Years: 30.00    Pack years: 45.00    Types: Cigarettes    Last attempt to quit: 04/19/2013    Years since quitting: 4.8  . Smokeless tobacco: Current User  Substance and Sexual Activity  . Alcohol use: No    Alcohol/week: 0.0 standard drinks    Frequency:  Never    Comment: occ  . Drug use: No  . Sexual activity: Yes    Partners: Female  Lifestyle  . Physical activity:    Days per week: 0 days    Minutes per session: 0 min  . Stress: Not at all  Relationships  . Social connections:    Talks on phone: More than three times a week    Gets together: More than three times a week    Attends religious service: More than 4 times per year    Active member of club or organization: Yes    Attends meetings of clubs or organizations: More than 4 times per year    Relationship status: Married  . Intimate partner violence:    Fear of current or ex partner: No    Emotionally abused: No    Physically abused: No    Forced sexual activity: No  Other Topics Concern  . Not on file  Social History Narrative  . Not on file     Current Outpatient Medications:  .  atorvastatin (LIPITOR) 80 MG tablet, Take 1 tablet (80 mg total) by mouth daily at 6 PM., Disp: 30 tablet, Rfl: 5 .  clonazePAM (KLONOPIN) 0.5 MG tablet, Take 1 tablet (0.5 mg total) by mouth 2 (two) times daily as needed for anxiety. Before flights, Disp: 6 tablet, Rfl: 0 .  clopidogrel (PLAVIX) 75 MG tablet, Take 1 tablet (75 mg total) by mouth daily., Disp: 30 tablet, Rfl: 5 .  Dapagliflozin-metFORMIN  HCl ER (XIGDUO XR) 08-998 MG TB24, Take 1 tablet by mouth 2 (two) times daily., Disp: 60 tablet, Rfl: 5 .  ezetimibe (ZETIA) 10 MG tablet, TAKE 1 TABLET BY MOUTH EVERY DAY, Disp: 30 tablet, Rfl: 6 .  famotidine (PEPCID) 20 MG tablet, Take 1 tablet (20 mg total) by mouth 2 (two) times daily., Disp: 60 tablet, Rfl: 5 .  fluticasone (FLONASE) 50 MCG/ACT nasal spray, Place 2 sprays into both nostrils as needed., Disp: 48 g, Rfl: 1 .  fluticasone furoate-vilanterol (BREO ELLIPTA) 100-25 MCG/INH AEPB, Inhale 1 puff into the lungs daily., Disp: 60 each, Rfl: 0 .  Insulin Degludec-Liraglutide (XULTOPHY) 100-3.6 UNIT-MG/ML SOPN, Inject 50 Units into the skin daily. He needs 6 pens to last one month,  Disp: 18 mL, Rfl: 1 .  Insulin Pen Needle (BD PEN NEEDLE NANO U/F) 32G X 4 MM MISC, Use as directed. E11.29, Disp: 100 each, Rfl: 12 .  lisinopril (PRINIVIL,ZESTRIL) 10 MG tablet, TAKE 1 TABLET BY MOUTH EVERY DAY, Disp: 30 tablet, Rfl: 5 .  loratadine (CLARITIN) 10 MG tablet, Take 1 tablet by mouth daily., Disp: , Rfl:  .  meloxicam (MOBIC) 15 MG tablet, TAKE 1 TABLET BY MOUTH EVERY DAY, Disp: 30 tablet, Rfl: 0 .  metaxalone (SKELAXIN) 800 MG tablet, TAKE 1 TABLET (800 MG TOTAL) BY MOUTH 3 (THREE) TIMES DAILY AS NEEDED FOR MUSCLE SPASMS., Disp: 40 tablet, Rfl: 0 .  metoprolol succinate (TOPROL-XL) 25 MG 24 hr tablet, Take 1 tablet (25 mg total) by mouth daily., Disp: 30 tablet, Rfl: 5 .  sildenafil (REVATIO) 20 MG tablet, TAKE AS NEEDED, Disp: 50 tablet, Rfl: 0 .  triamcinolone cream (KENALOG) 0.1 %, Apply 1 application topically 2 (two) times daily., Disp: 80 g, Rfl: 0 .  VASCEPA 1 g CAPS, TAKE 2 CAPSULES BY MOUTH 2 (TWO) TIMES DAILY., Disp: 120 capsule, Rfl: 5  No Known Allergies   ROS  Constitutional: Negative for fever or weight change.  Respiratory: Positive  for cough but no shortness of breath.   Cardiovascular: Negative for chest pain or palpitations.  Gastrointestinal: Negative for abdominal pain, no bowel changes.  Musculoskeletal: Negative for gait problem or joint swelling. Muscle Cramping  Skin: Negative for rash.  Neurological: Negative for dizziness or headache.  No other specific complaints in a complete review of systems (except as listed in HPI above).  Objective  Vitals:   02/26/18 0936  BP: 118/72  Pulse: 90  Resp: 16  SpO2: 95%  Weight: 229 lb 1.6 oz (103.9 kg)  Height: 5' 8"  (1.727 m)    Body mass index is 34.83 kg/m.  Physical Exam  Constitutional: Patient appears well-developed and obesity .  No distress.  HENT: Head: Normocephalic and atraumatic. Ears: B TMs ok, no erythema or effusion; Nose: Nose normal. Mouth/Throat: Oropharynx is clear and moist.  No oropharyngeal exudate.  Eyes: Conjunctivae and EOM are normal. Pupils are equal, round, and reactive to light. No scleral icterus.  Neck: Normal range of motion. Neck supple. No JVD present. No thyromegaly present.  Cardiovascular: Normal rate, regular rhythm and normal heart sounds.  No murmur heard. No BLE edema. Pulmonary/Chest: Effort normal and breath sounds normal. No respiratory distress. Abdominal: Soft. Bowel sounds are normal, no distension. There is no tenderness. no masses MALE GENITALIA: Normal descended testes bilaterally, no masses palpated, no hernias, no lesions, no discharge RECTAL: Prostate enlarged and smooth Musculoskeletal: Normal range of motion, no joint effusions. No gross deformities. He has normal rom of shoulder,  no atrophy but has pain during palpation of left deltoid bursa  Neurological: he is alert and oriented to person, place, and time. No cranial nerve deficit. Coordination, balance, strength, speech and gait are normal.  Skin: Skin is warm and dry. No rash noted. No erythema.  Psychiatric: Patient has a normal mood and affect. behavior is normal. Judgment and thought content normal.  PHQ2/9: Depression screen [facility] 2/9 02/26/2018 11/20/2017 08/14/2017 07/30/2016 05/26/2016  Decreased Interest 0 0 0 0 0  Down, Depressed, Hopeless 0 0 0 0 0  PHQ - 2 Score 0 0 0 0 0  Altered sleeping 0 - - - -  Tired, decreased energy 0 - - - -  Change in appetite 0 - - - -  Feeling bad or failure about yourself  0 - - - -  Trouble concentrating 0 - - - -  Moving slowly or fidgety/restless 0 - - - -  Suicidal thoughts 0 - - - -  PHQ-9 Score 0 - - - -  Difficult doing work/chores Not difficult at all - - - -     Fall Risk: Fall Risk  02/26/2018 11/20/2017 08/14/2017 07/30/2016 05/26/2016  Falls in the past year? 0 No No No No    Functional Status Survey: Is the patient deaf or have difficulty hearing?: No Does the patient have difficulty seeing, even when wearing  glasses/contacts?: No Does the patient have difficulty concentrating, remembering, or making decisions?: No Does the patient have difficulty walking or climbing stairs?: No Does the patient have difficulty dressing or bathing?: No Does the patient have difficulty doing errands alone such as visiting a doctor's office or shopping?: No    Assessment & Plan   1. Type 2 diabetes mellitus with diabetic nephropathy, without long-term current use of insulin (HCC)  - POCT glycosylated hemoglobin (Hb A1C) - Dapagliflozin-metFORMIN HCl ER (XIGDUO XR) 08-998 MG TB24; Take 1 tablet by mouth 2 (two) times daily.  Dispense: 60 tablet; Refill: 5 - Insulin Degludec-Liraglutide (XULTOPHY) 100-3.6 UNIT-MG/ML SOPN; Inject 50 Units into the skin daily. He needs 6 pens to last one month  Dispense: 18 mL; Refill: 1  2. Need for influenza vaccination  - Flu Vaccine QUAD 6+ mos PF IM (Fluarix Quad PF)  3. Well adult exam   4. Dyslipidemia associated with type 2 diabetes mellitus (HCC)  - Dapagliflozin-metFORMIN HCl ER (XIGDUO XR) 08-998 MG TB24; Take 1 tablet by mouth 2 (two) times daily.  Dispense: 60 tablet; Refill: 5 - Insulin Degludec-Liraglutide (XULTOPHY) 100-3.6 UNIT-MG/ML SOPN; Inject 50 Units into the skin daily. He needs 6 pens to last one month  Dispense: 18 mL; Refill: 1  5. NASH (nonalcoholic steatohepatitis)   6. H/O acute myocardial infarction   7. Benign hypertension  - metoprolol succinate (TOPROL-XL) 25 MG 24 hr tablet; Take 1 tablet (25 mg total) by mouth daily.  Dispense: 30 tablet; Refill: 5  8. Arteriosclerosis of coronary artery  - clopidogrel (PLAVIX) 75 MG tablet; Take 1 tablet (75 mg total) by mouth daily.  Dispense: 30 tablet; Refill: 5 - metoprolol succinate (TOPROL-XL) 25 MG 24 hr tablet; Take 1 tablet (25 mg total) by mouth daily.  Dispense: 30 tablet; Refill: 5  9. OSA (obstructive sleep apnea)  He does not wear CPAP   10. ED (erectile dysfunction) of organic  origin   11. Hypercholesteremia  - atorvastatin (LIPITOR) 80 MG tablet; Take 1 tablet (80 mg total) by mouth daily at 6 PM.  Dispense: 30 tablet; Refill: 5  12. Gastro-esophageal reflux disease without esophagitis  - famotidine (PEPCID) 20 MG tablet; Take 1 tablet (20 mg total) by mouth 2 (two) times daily.  Dispense: 60 tablet; Refill: 5  -Prostate cancer screening and PSA options (with potential risks and benefits of testing vs not testing) were discussed along with recent recs/guidelines. -USPSTF grade A and B recommendations reviewed with patient; age-appropriate recommendations, preventive care, screening tests, etc discussed and encouraged; healthy living encouraged; see AVS for patient education given to patient -Discussed importance of 150 minutes of physical activity weekly, eat two servings of fish weekly, eat one serving of tree nuts ( cashews, pistachios, pecans, almonds.Marland Kitchen) every other day, eat 6 servings of fruit/vegetables daily and drink plenty of water and avoid sweet beverages.

## 2018-02-26 NOTE — Patient Instructions (Signed)

## 2018-02-27 LAB — COMPLETE METABOLIC PANEL WITH GFR
AG Ratio: 2 (calc) (ref 1.0–2.5)
ALBUMIN MSPROF: 4.9 g/dL (ref 3.6–5.1)
ALT: 29 U/L (ref 9–46)
AST: 19 U/L (ref 10–35)
Alkaline phosphatase (APISO): 65 U/L (ref 40–115)
BUN: 11 mg/dL (ref 7–25)
CO2: 25 mmol/L (ref 20–32)
CREATININE: 0.78 mg/dL (ref 0.70–1.33)
Calcium: 9.9 mg/dL (ref 8.6–10.3)
Chloride: 101 mmol/L (ref 98–110)
GFR, Est African American: 118 mL/min/{1.73_m2} (ref >=60)
GFR, Est Non African American: 102 mL/min/{1.73_m2} (ref >=60)
GLOBULIN: 2.4 g/dL (ref 1.9–3.7)
Glucose, Bld: 112 mg/dL — ABNORMAL HIGH (ref 65–99)
Potassium: 4.3 mmol/L (ref 3.5–5.3)
SODIUM: 135 mmol/L (ref 135–146)
Total Bilirubin: 1.2 mg/dL (ref 0.2–1.2)
Total Protein: 7.3 g/dL (ref 6.1–8.1)

## 2018-02-27 LAB — LIPID PANEL
CHOLESTEROL: 122 mg/dL (ref <200)
HDL: 26 mg/dL — ABNORMAL LOW (ref >40)
LDL Cholesterol (Calc): 66 mg/dL (calc)
Non-HDL Cholesterol (Calc): 96 mg/dL (calc) (ref <130)
TRIGLYCERIDES: 256 mg/dL — AB (ref <150)
Total CHOL/HDL Ratio: 4.7 (calc) (ref <5.0)

## 2018-02-27 LAB — CBC WITH DIFFERENTIAL/PLATELET
BASOS ABS: 69 {cells}/uL (ref 0–200)
Basophils Relative: 0.8 %
EOS ABS: 155 {cells}/uL (ref 15–500)
Eosinophils Relative: 1.8 %
HCT: 46.1 % (ref 38.5–50.0)
HEMOGLOBIN: 16 g/dL (ref 13.2–17.1)
Lymphs Abs: 2331 cells/uL (ref 850–3900)
MCH: 31.1 pg (ref 27.0–33.0)
MCHC: 34.7 g/dL (ref 32.0–36.0)
MCV: 89.5 fL (ref 80.0–100.0)
MONOS PCT: 9.5 %
MPV: 10.7 fL (ref 7.5–12.5)
NEUTROS PCT: 60.8 %
Neutro Abs: 5229 cells/uL (ref 1500–7800)
PLATELETS: 326 10*3/uL (ref 140–400)
RBC: 5.15 10*6/uL (ref 4.20–5.80)
RDW: 11.8 % (ref 11.0–15.0)
TOTAL LYMPHOCYTE: 27.1 %
WBC mixed population: 817 cells/uL (ref 200–950)
WBC: 8.6 10*3/uL (ref 3.8–10.8)

## 2018-02-27 LAB — VITAMIN B12: Vitamin B-12: 1069 pg/mL (ref 200–1100)

## 2018-02-27 LAB — PSA: PSA: 0.8 ng/mL (ref <=4.0)

## 2018-03-11 ENCOUNTER — Other Ambulatory Visit: Payer: Self-pay

## 2018-03-11 DIAGNOSIS — N529 Male erectile dysfunction, unspecified: Secondary | ICD-10-CM

## 2018-03-11 NOTE — Telephone Encounter (Signed)
Refill request for general medication. Sildenafil to [facility]   Last office visit 02/26/2018   Follow up on 05/28/18

## 2018-03-12 ENCOUNTER — Other Ambulatory Visit: Payer: Self-pay | Admitting: Family Medicine

## 2018-03-12 DIAGNOSIS — N529 Male erectile dysfunction, unspecified: Secondary | ICD-10-CM

## 2018-03-13 MED ORDER — SILDENAFIL CITRATE 20 MG PO TABS: 20.0000 mg | ORAL_TABLET | ORAL | 0 refills | Status: DC | PRN

## 2018-03-21 ENCOUNTER — Other Ambulatory Visit: Payer: Self-pay | Admitting: Family Medicine

## 2018-03-21 DIAGNOSIS — M62838 Other muscle spasm: Secondary | ICD-10-CM

## 2018-03-22 ENCOUNTER — Other Ambulatory Visit: Payer: Self-pay | Admitting: Family Medicine

## 2018-03-22 DIAGNOSIS — R21 Rash and other nonspecific skin eruption: Secondary | ICD-10-CM

## 2018-03-22 NOTE — Telephone Encounter (Signed)
Refill request for general medication: Triamcinolone Cream 0.1%  Last office visit: 02/26/2018  Last physical exam: None indicated  Follow-ups on file. 05/28/2018

## 2018-04-20 ENCOUNTER — Other Ambulatory Visit: Payer: Self-pay | Admitting: Family Medicine

## 2018-04-20 DIAGNOSIS — K219 Gastro-esophageal reflux disease without esophagitis: Secondary | ICD-10-CM

## 2018-05-05 ENCOUNTER — Other Ambulatory Visit: Payer: Self-pay | Admitting: Family Medicine

## 2018-05-05 DIAGNOSIS — N529 Male erectile dysfunction, unspecified: Secondary | ICD-10-CM

## 2018-05-05 MED ORDER — SILDENAFIL CITRATE 20 MG PO TABS: 20.0000 mg | ORAL_TABLET | ORAL | 0 refills | Status: DC | PRN

## 2018-05-05 NOTE — Telephone Encounter (Signed)
Copied from [place] [phone]. Topic: General - Other >> May 05, 2018 11:28 AM Lennox Solders wrote: Reason for CRM:pt is calling and needs a refill on sildenafil [facility]. Pt is having issues with the pharm

## 2018-05-06 ENCOUNTER — Other Ambulatory Visit: Payer: Self-pay | Admitting: Family Medicine

## 2018-05-06 ENCOUNTER — Other Ambulatory Visit: Payer: Self-pay

## 2018-05-06 DIAGNOSIS — E1121 Type 2 diabetes mellitus with diabetic nephropathy: Secondary | ICD-10-CM

## 2018-05-06 NOTE — Telephone Encounter (Signed)
Refill request for diabetic medication:   One Touch Ultra Blue test Strips  Last office visit pertaining to diabetes: 02/26/2018   Lab Results  Component Value Date   HGBA1C 7.4 (A) 02/26/2018   Follow-ups on file. 05/28/2018

## 2018-05-10 ENCOUNTER — Other Ambulatory Visit: Payer: Self-pay

## 2018-05-10 DIAGNOSIS — E1169 Type 2 diabetes mellitus with other specified complication: Secondary | ICD-10-CM

## 2018-05-10 DIAGNOSIS — E1121 Type 2 diabetes mellitus with diabetic nephropathy: Secondary | ICD-10-CM

## 2018-05-10 DIAGNOSIS — E785 Hyperlipidemia, unspecified: Secondary | ICD-10-CM

## 2018-05-10 MED ORDER — INSULIN DEGLUDEC-LIRAGLUTIDE 100-3.6 UNIT-MG/ML ~~LOC~~ SOPN: 50.0000 [IU] | PEN_INJECTOR | Freq: Every day | SUBCUTANEOUS | 1 refills | Status: DC

## 2018-05-10 NOTE — Telephone Encounter (Signed)
Request for diabetes medication. Xultophy to [facility]  Last office visit pertaining to diabetes: 02/26/2018   Lab Results  Component Value Date   HGBA1C 7.4 (A) 02/26/2018      Follow up on 05/28/2018

## 2018-05-28 ENCOUNTER — Ambulatory Visit: Payer: 59 | Admitting: Family Medicine

## 2018-05-28 ENCOUNTER — Encounter: Payer: Self-pay | Admitting: Family Medicine

## 2018-05-28 VITALS — BP 110/70 | HR 82 | Temp 98.0°F | Resp 16 | Ht 68.0 in | Wt 231.0 lb

## 2018-05-28 DIAGNOSIS — E781 Pure hyperglyceridemia: Secondary | ICD-10-CM

## 2018-05-28 DIAGNOSIS — K219 Gastro-esophageal reflux disease without esophagitis: Secondary | ICD-10-CM

## 2018-05-28 DIAGNOSIS — I10 Essential (primary) hypertension: Secondary | ICD-10-CM

## 2018-05-28 DIAGNOSIS — E1121 Type 2 diabetes mellitus with diabetic nephropathy: Secondary | ICD-10-CM

## 2018-05-28 DIAGNOSIS — I2510 Atherosclerotic heart disease of native coronary artery without angina pectoris: Secondary | ICD-10-CM | POA: Diagnosis not present

## 2018-05-28 DIAGNOSIS — E7800 Pure hypercholesterolemia, unspecified: Secondary | ICD-10-CM

## 2018-05-28 DIAGNOSIS — E1169 Type 2 diabetes mellitus with other specified complication: Secondary | ICD-10-CM

## 2018-05-28 DIAGNOSIS — G4709 Other insomnia: Secondary | ICD-10-CM

## 2018-05-28 DIAGNOSIS — E785 Hyperlipidemia, unspecified: Secondary | ICD-10-CM

## 2018-05-28 DIAGNOSIS — M62830 Muscle spasm of back: Secondary | ICD-10-CM

## 2018-05-28 DIAGNOSIS — J3089 Other allergic rhinitis: Secondary | ICD-10-CM

## 2018-05-28 LAB — POCT GLYCOSYLATED HEMOGLOBIN (HGB A1C): Hemoglobin A1C: 7.9 % — AB (ref 4.0–5.6)

## 2018-05-28 MED ORDER — FAMOTIDINE 20 MG PO TABS: 20.0000 mg | ORAL_TABLET | Freq: Two times a day (BID) | ORAL | 5 refills | Status: DC

## 2018-05-28 MED ORDER — ICOSAPENT ETHYL 1 G PO CAPS: ORAL_CAPSULE | ORAL | 5 refills | Status: DC

## 2018-05-28 MED ORDER — METOPROLOL SUCCINATE ER 25 MG PO TB24: 25.0000 mg | ORAL_TABLET | Freq: Every day | ORAL | 5 refills | Status: DC

## 2018-05-28 MED ORDER — CLOPIDOGREL BISULFATE 75 MG PO TABS: 75.0000 mg | ORAL_TABLET | Freq: Every day | ORAL | 5 refills | Status: DC

## 2018-05-28 MED ORDER — FLUTICASONE PROPIONATE 50 MCG/ACT NA SUSP: 2.0000 | NASAL | 1 refills | Status: DC | PRN

## 2018-05-28 MED ORDER — DAPAGLIFLOZIN PRO-METFORMIN ER 5-1000 MG PO TB24: 1.0000 | ORAL_TABLET | Freq: Two times a day (BID) | ORAL | 5 refills | Status: DC

## 2018-05-28 MED ORDER — ATORVASTATIN CALCIUM 80 MG PO TABS: 80.0000 mg | ORAL_TABLET | Freq: Every day | ORAL | 5 refills | Status: DC

## 2018-05-28 MED ORDER — TRAZODONE HCL 50 MG PO TABS: 25.0000 mg | ORAL_TABLET | Freq: Every evening | ORAL | 0 refills | Status: DC | PRN

## 2018-05-28 MED ORDER — INSULIN DEGLUDEC-LIRAGLUTIDE 100-3.6 UNIT-MG/ML ~~LOC~~ SOPN: 50.0000 [IU] | PEN_INJECTOR | Freq: Every day | SUBCUTANEOUS | 1 refills | Status: DC

## 2018-05-28 MED ORDER — EZETIMIBE 10 MG PO TABS: 10.0000 mg | ORAL_TABLET | Freq: Every day | ORAL | 6 refills | Status: DC

## 2018-05-28 MED ORDER — METAXALONE 800 MG PO TABS: 800.0000 mg | ORAL_TABLET | Freq: Three times a day (TID) | ORAL | 0 refills | Status: DC | PRN

## 2018-05-28 MED ORDER — INSULIN REGULAR HUMAN 100 UNIT/ML IJ SOPN: 2.0000 [IU] | PEN_INJECTOR | Freq: Every day | INTRAMUSCULAR | 0 refills | Status: DC

## 2018-05-28 NOTE — Patient Instructions (Signed)
Pre-meal insulin : check sugar right before you eat  Check fsbs prior to meals  Hold if below 110 If glucose 110- 140 give 4 units If glucose 140 to 180 give 6 units  If glucose 180 to 220  give 8 units If glucose above 220 give 10 units

## 2018-05-28 NOTE — Progress Notes (Signed)
Name: Jesse Lawrence.   MRN: [id number]    DOB: 1961/03/19   Date:05/28/2018       Progress Note  Subjective  Chief Complaint  Chief Complaint  Patient presents with  . Diabetes  . Hypertension  . Hyperlipidemia  . Stress    HPI    DM II : His hgbA1C has been trending up, 7.1% and today 7.9% He is still on Xigduo also and denies side effects of medications. He is not compliant with diet lately, states worse when his mother in law died and got a lot of unhealthy food.  He has microalbuminuria and is on ACE. He has dyslipidemia, and is taking mediation. Last triglycerides was much better, on Vascepa and statin therapy.  Insomnia: he has noticed that over the past 6 weeks he has not been sleeping well, he states he had some more stress at work followed by his mother in law dying  Feb 1st, 2020 , and yesterday his father in law moved with his sister in law that lives 3 hours away. He misses him and is upset about it. They lived across the street from him and his wife. He has also noticed that he has been more moody, but denies sadness. He is willing to start Trazodone   HTN/CAD: no chest pain, no palpitation, bp istowards low end of normalno side effects of medication, including no dizziness, we will continue to monitor. He denies decrease in exercise tolerance. He is on Plavix, statin and Zetia., Vascepa. Still sees Dr. Nehemiah Massed.  ED: he has difficulty maintaining and erection, uses Cialis prn, he is doing well on Sildenafilprn. He is not sure if he needs refill  OSA: he has not been compliant with CPAP machine, he cannot tolerated. I explained importance of compliance with therapy. He has been waking up during the night. Explained that he needs to re-consider having another study.   Back spasms: he is doing well now, he states Baclofen stopped working, he is now taking skelaxin prn and needs a refill.   Patient Active Problem List   Diagnosis Date Noted  . OSA (obstructive  sleep apnea) 05/21/2015  . Perennial allergic rhinitis 11/07/2014  . Arteriosclerosis of coronary artery 11/07/2014  . CD (contact dermatitis) 11/07/2014  . Decreased libido 11/07/2014  . Diabetes mellitus with renal manifestation ([place]) 11/07/2014  . Gastro-esophageal reflux disease without esophagitis 11/07/2014  . H/O acute myocardial infarction 11/07/2014  . Hypercholesteremia 11/07/2014  . Benign hypertension 11/07/2014  . Hypertriglyceridemia 11/07/2014  . H/O high risk medication treatment 11/07/2014  . NASH (nonalcoholic steatohepatitis) 11/07/2014  . Microalbuminuria 11/07/2014  . Adult BMI 30+ 11/07/2014  . Fungal infection of toenail 11/07/2014  . Tobacco abuse 11/07/2014  . ED (erectile dysfunction) of organic origin 09/29/2006    Past Surgical History:  Procedure Laterality Date  . cadiac stenting    . CYSTECTOMY     58 years of age  . KNEE ARTHROSCOPY      Family History  Problem Relation Age of Onset  . Diabetes Mother   . CAD Mother   . Heart disease Mother   . Heart attack Mother   . Heart disease Father   . Heart attack Father     Social History   Socioeconomic History  . Marital status: Married    Spouse name: Joseph Art  . Number of children: 4  . Years of education: Not on file  . Highest education level: Not on file  Occupational History  .  Not on file  Social Needs  . Financial resource strain: Not hard at all  . Food insecurity:    Worry: Never true    Inability: Never true  . Transportation needs:    Medical: No    Non-medical: No  Tobacco Use  . Smoking status: Former Smoker    Packs/day: 1.50    Years: 30.00    Pack years: 45.00    Types: Cigarettes    Last attempt to quit: 04/19/2013    Years since quitting: 5.1  . Smokeless tobacco: Current User  Substance and Sexual Activity  . Alcohol use: No    Alcohol/week: 0.0 standard drinks    Frequency: Never    Comment: occ  . Drug use: No  . Sexual activity: Yes    Partners:  Female  Lifestyle  . Physical activity:    Days per week: 0 days    Minutes per session: 0 min  . Stress: Not at all  Relationships  . Social connections:    Talks on phone: More than three times a week    Gets together: More than three times a week    Attends religious service: More than 4 times per year    Active member of club or organization: Yes    Attends meetings of clubs or organizations: More than 4 times per year    Relationship status: Married  . Intimate partner violence:    Fear of current or ex partner: No    Emotionally abused: No    Physically abused: No    Forced sexual activity: No  Other Topics Concern  . Not on file  Social History Narrative  . Not on file     Current Outpatient Medications:  .  atorvastatin (LIPITOR) 80 MG tablet, Take 1 tablet (80 mg total) by mouth daily at 6 PM., Disp: 30 tablet, Rfl: 5 .  clonazePAM (KLONOPIN) 0.5 MG tablet, Take 1 tablet (0.5 mg total) by mouth 2 (two) times daily as needed for anxiety. Before flights, Disp: 6 tablet, Rfl: 0 .  clopidogrel (PLAVIX) 75 MG tablet, Take 1 tablet (75 mg total) by mouth daily., Disp: 30 tablet, Rfl: 5 .  Dapagliflozin-metFORMIN HCl ER (XIGDUO XR) 08-998 MG TB24, Take 1 tablet by mouth 2 (two) times daily., Disp: 60 tablet, Rfl: 5 .  ezetimibe (ZETIA) 10 MG tablet, Take 1 tablet (10 mg total) by mouth daily., Disp: 30 tablet, Rfl: 6 .  famotidine (PEPCID) 20 MG tablet, Take 1 tablet (20 mg total) by mouth 2 (two) times daily., Disp: 60 tablet, Rfl: 5 .  fluticasone (FLONASE) 50 MCG/ACT nasal spray, Place 2 sprays into both nostrils as needed., Disp: 48 g, Rfl: 1 .  Icosapent Ethyl (VASCEPA) 1 g CAPS, TAKE 2 CAPSULES BY MOUTH 2 (TWO) TIMES DAILY., Disp: 120 capsule, Rfl: 5 .  Insulin Degludec-Liraglutide (XULTOPHY) 100-3.6 UNIT-MG/ML SOPN, Inject 50 Units into the skin daily. He needs 6 pens to last one month, Disp: 18 mL, Rfl: 1 .  Insulin Pen Needle (BD PEN NEEDLE NANO U/F) 32G X 4 MM MISC,  Use as directed. E11.29, Disp: 100 each, Rfl: 12 .  lisinopril (PRINIVIL,ZESTRIL) 10 MG tablet, TAKE 1 TABLET BY MOUTH EVERY DAY, Disp: 30 tablet, Rfl: 5 .  loratadine (CLARITIN) 10 MG tablet, Take 1 tablet by mouth daily., Disp: , Rfl:  .  meloxicam (MOBIC) 15 MG tablet, TAKE 1 TABLET BY MOUTH EVERY DAY, Disp: 30 tablet, Rfl: 0 .  metaxalone (SKELAXIN) 800 MG  tablet, Take 1 tablet (800 mg total) by mouth 3 (three) times daily as needed for muscle spasms., Disp: 40 tablet, Rfl: 0 .  metoprolol succinate (TOPROL-XL) 25 MG 24 hr tablet, Take 1 tablet (25 mg total) by mouth daily., Disp: 30 tablet, Rfl: 5 .  ONE TOUCH ULTRA TEST test strip, USE AS DIRECTED, Disp: 100 each, Rfl: 11 .  sildenafil (REVATIO) 20 MG tablet, Take 1 tablet (20 mg total) by mouth as needed., Disp: 50 tablet, Rfl: 0 .  Insulin Regular Human (NOVOLIN R FLEXPEN) 100 UNIT/ML SOPN, Inject 2-10 Units as directed daily before supper., Disp: 15 mL, Rfl: 0 .  triamcinolone cream (KENALOG) 0.1 %, Apply topically 2 (two) times daily. Apply to arms (Patient not taking: Reported on 05/28/2018), Disp: 60 g, Rfl: 0  No Known Allergies  I personally reviewed active problem list, medication list, allergies, family history, social history with the patient/caregiver today.   ROS  Constitutional: Negative for fever or weight change.  Respiratory: Negative for cough and shortness of breath.   Cardiovascular: Negative for chest pain or palpitations.  Gastrointestinal: Negative for abdominal pain, no bowel changes.  Musculoskeletal: Negative for gait problem or joint swelling.  Skin: Negative for rash.  Neurological: Negative for dizziness or headache.  No other specific complaints in a complete review of systems (except as listed in HPI above).  Objective  Vitals:   05/28/18 1105  BP: 110/70  Pulse: 82  Resp: 16  Temp: 98 F (36.7 C)  TempSrc: Oral  SpO2: 97%  Weight: 231 lb (104.8 kg)  Height: 5' 8"  (1.727 m)    Body mass  index is 35.12 kg/m.  Physical Exam  Constitutional: Patient appears well-developed and well-nourished. Obese  No distress.  HEENT: head atraumatic, normocephalic, pupils equal and reactive to light,  neck supple, throat within normal limits Cardiovascular: Normal rate, regular rhythm and normal heart sounds.  No murmur heard. No BLE edema. Pulmonary/Chest: Effort normal and breath sounds normal. No respiratory distress. Abdominal: Soft.  There is no tenderness. Psychiatric: Patient has a normal mood and affect. behavior is normal. Judgment and thought content normal.  Recent Results (from the past 2160 hour(s))  POCT HgB A1C     Status: Abnormal   Collection Time: 05/28/18 11:15 AM  Result Value Ref Range   Hemoglobin A1C 7.9 (A) 4.0 - 5.6 %   HbA1c POC (<> result, manual entry)     HbA1c, POC (prediabetic range)     HbA1c, POC (controlled diabetic range)        PHQ2/9: Depression screen [facility] 2/9 05/28/2018 02/26/2018 11/20/2017 08/14/2017 07/30/2016  Decreased Interest 0 0 0 0 0  Down, Depressed, Hopeless 0 0 0 0 0  PHQ - 2 Score 0 0 0 0 0  Altered sleeping 2 0 - - -  Tired, decreased energy 1 0 - - -  Change in appetite 2 0 - - -  Feeling bad or failure about yourself  0 0 - - -  Trouble concentrating 0 0 - - -  Moving slowly or fidgety/restless 0 0 - - -  Suicidal thoughts 0 0 - - -  PHQ-9 Score 5 0 - - -  Difficult doing work/chores Not difficult at all Not difficult at all - - -     Fall Risk: Fall Risk  02/26/2018 11/20/2017 08/14/2017 07/30/2016 05/26/2016  Falls in the past year? 0 No No No No     Assessment & Plan   1. Type 2  diabetes mellitus with diabetic nephropathy, without long-term current use of insulin (HCC)  - POCT HgB A1C - Insulin Degludec-Liraglutide (XULTOPHY) 100-3.6 UNIT-MG/ML SOPN; Inject 50 Units into the skin daily. He needs 6 pens to last one month  Dispense: 18 mL; Refill: 1 - Dapagliflozin-metFORMIN HCl ER (XIGDUO XR) 08-998 MG TB24; Take 1  tablet by mouth 2 (two) times daily.  Dispense: 60 tablet; Refill: 5 - Insulin Regular Human (NOVOLIN R FLEXPEN) 100 UNIT/ML SOPN; Inject 2-10 Units as directed daily before supper.  Dispense: 15 mL; Refill: 0  HgbA1C 7.9% he will resume diet and use pre-meal insulin   2. Arteriosclerosis of coronary artery  - metoprolol succinate (TOPROL-XL) 25 MG 24 hr tablet; Take 1 tablet (25 mg total) by mouth daily.  Dispense: 30 tablet; Refill: 5 - ezetimibe (ZETIA) 10 MG tablet; Take 1 tablet (10 mg total) by mouth daily.  Dispense: 30 tablet; Refill: 6 - clopidogrel (PLAVIX) 75 MG tablet; Take 1 tablet (75 mg total) by mouth daily.  Dispense: 30 tablet; Refill: 5  3. Benign hypertension  - metoprolol succinate (TOPROL-XL) 25 MG 24 hr tablet; Take 1 tablet (25 mg total) by mouth daily.  Dispense: 30 tablet; Refill: 5  4. Dyslipidemia associated with type 2 diabetes mellitus (HCC)  - Insulin Degludec-Liraglutide (XULTOPHY) 100-3.6 UNIT-MG/ML SOPN; Inject 50 Units into the skin daily. He needs 6 pens to last one month  Dispense: 18 mL; Refill: 1 - Dapagliflozin-metFORMIN HCl ER (XIGDUO XR) 08-998 MG TB24; Take 1 tablet by mouth 2 (two) times daily.  Dispense: 60 tablet; Refill: 5 - Icosapent Ethyl (VASCEPA) 1 g CAPS; TAKE 2 CAPSULES BY MOUTH 2 (TWO) TIMES DAILY.  Dispense: 120 capsule; Refill: 5  5. Perennial allergic rhinitis  - fluticasone (FLONASE) 50 MCG/ACT nasal spray; Place 2 sprays into both nostrils as needed.  Dispense: 48 g; Refill: 1  6. Gastro-esophageal reflux disease without esophagitis  - famotidine (PEPCID) 20 MG tablet; Take 1 tablet (20 mg total) by mouth 2 (two) times daily.  Dispense: 60 tablet; Refill: 5  7. Hypercholesteremia  - ezetimibe (ZETIA) 10 MG tablet; Take 1 tablet (10 mg total) by mouth daily.  Dispense: 30 tablet; Refill: 6 - atorvastatin (LIPITOR) 80 MG tablet; Take 1 tablet (80 mg total) by mouth daily at 6 PM.  Dispense: 30 tablet; Refill: 5  8.  Hypertriglyceridemia  - Icosapent Ethyl (VASCEPA) 1 g CAPS; TAKE 2 CAPSULES BY MOUTH 2 (TWO) TIMES DAILY.  Dispense: 120 capsule; Refill: 5  9. Back muscle spasm  - metaxalone (SKELAXIN) 800 MG tablet; Take 1 tablet (800 mg total) by mouth 3 (three) times daily as needed for muscle spasms.  Dispense: 40 tablet; Refill: 0  10. Other insomnia  - traZODone (DESYREL) 50 MG tablet; Take 0.5-2 tablets (25-100 mg total) by mouth at bedtime as needed for sleep.  Dispense: 60 tablet; Refill: 0

## 2018-06-02 ENCOUNTER — Telehealth: Payer: Self-pay

## 2018-06-02 NOTE — Telephone Encounter (Signed)
Contact drug rep, give a sample if needed

## 2018-06-02 NOTE — Telephone Encounter (Signed)
Copied from [place] [id number]. Topic: General - Other >> Jun 01, 2018  5:21 PM Yvette Rack wrote: Reason for CRM: Pt stated his pharmacy called him to advise that the Insulin Degludec-Liraglutide (XULTOPHY) 100-3.6 UNIT-MG/ML SOPN is not available to be ordered. Pt requests that another Rx be called in. Pt stated he would like another pen because he has issues with drawing the insulin from a vial. Cb# [phone]

## 2018-06-02 NOTE — Telephone Encounter (Signed)
Spoke with pharmacist at [facility] and they state Xultophy has no shortages it is the [place] that is unavailable. Please change to another medication to take before his meal times,.

## 2018-06-03 ENCOUNTER — Other Ambulatory Visit: Payer: Self-pay | Admitting: Family Medicine

## 2018-06-03 MED ORDER — INSULIN ASPART 100 UNIT/ML FLEXPEN: 4.0000 [IU] | PEN_INJECTOR | Freq: Three times a day (TID) | SUBCUTANEOUS | 2 refills | Status: DC

## 2018-06-04 ENCOUNTER — Telehealth: Payer: Self-pay

## 2018-06-04 NOTE — Telephone Encounter (Signed)
Patient notified he has two samples of Xultophy in the refrigerator- his daughter is going to pick up today. Also that Novolog was sent to [facility] for his meal time insulin.

## 2018-06-04 NOTE — Telephone Encounter (Signed)
Copied from [place] [id number]. Topic: General - Other >> Jun 03, 2018 10:58 AM Antonieta Iba C wrote: Reason for CRM: pt called in to check to see if he has sample pen needles up front? Spoke with Suanne Marker, not showing that pen needles are up front. Pt would like to be advised about this.   Please assist.

## 2018-06-20 ENCOUNTER — Other Ambulatory Visit: Payer: Self-pay | Admitting: Family Medicine

## 2018-06-20 DIAGNOSIS — G4709 Other insomnia: Secondary | ICD-10-CM

## 2018-06-24 ENCOUNTER — Other Ambulatory Visit: Payer: Self-pay | Admitting: Family Medicine

## 2018-06-24 DIAGNOSIS — E1169 Type 2 diabetes mellitus with other specified complication: Secondary | ICD-10-CM

## 2018-06-24 DIAGNOSIS — E1121 Type 2 diabetes mellitus with diabetic nephropathy: Secondary | ICD-10-CM

## 2018-06-24 DIAGNOSIS — E785 Hyperlipidemia, unspecified: Secondary | ICD-10-CM

## 2018-06-24 NOTE — Telephone Encounter (Signed)
Request for diabetes medication.  Xultophy   Last office visit pertaining to diabetes: 05/28/2018   Lab Results  Component Value Date   HGBA1C 7.9 (A) 05/28/2018     Follow up on 08/26/2018

## 2018-06-26 ENCOUNTER — Other Ambulatory Visit: Payer: Self-pay | Admitting: Family Medicine

## 2018-06-26 DIAGNOSIS — E1121 Type 2 diabetes mellitus with diabetic nephropathy: Secondary | ICD-10-CM

## 2018-07-12 ENCOUNTER — Other Ambulatory Visit: Payer: Self-pay | Admitting: Family Medicine

## 2018-07-12 DIAGNOSIS — N529 Male erectile dysfunction, unspecified: Secondary | ICD-10-CM

## 2018-07-19 ENCOUNTER — Other Ambulatory Visit: Payer: Self-pay | Admitting: Family Medicine

## 2018-07-19 DIAGNOSIS — G4709 Other insomnia: Secondary | ICD-10-CM

## 2018-07-27 ENCOUNTER — Other Ambulatory Visit: Payer: Self-pay | Admitting: Family Medicine

## 2018-07-27 DIAGNOSIS — M62830 Muscle spasm of back: Secondary | ICD-10-CM

## 2018-07-27 DIAGNOSIS — F40243 Fear of flying: Secondary | ICD-10-CM

## 2018-08-19 ENCOUNTER — Other Ambulatory Visit: Payer: Self-pay | Admitting: Family Medicine

## 2018-08-19 DIAGNOSIS — G4709 Other insomnia: Secondary | ICD-10-CM

## 2018-08-22 ENCOUNTER — Other Ambulatory Visit: Payer: Self-pay | Admitting: Family Medicine

## 2018-08-22 DIAGNOSIS — M62838 Other muscle spasm: Secondary | ICD-10-CM

## 2018-08-22 DIAGNOSIS — E1121 Type 2 diabetes mellitus with diabetic nephropathy: Secondary | ICD-10-CM

## 2018-08-22 DIAGNOSIS — I10 Essential (primary) hypertension: Secondary | ICD-10-CM

## 2018-08-26 ENCOUNTER — Ambulatory Visit: Payer: 59 | Admitting: Family Medicine

## 2018-08-26 ENCOUNTER — Encounter: Payer: Self-pay | Admitting: Family Medicine

## 2018-08-26 ENCOUNTER — Other Ambulatory Visit: Payer: Self-pay

## 2018-08-26 VITALS — BP 128/74 | HR 89 | Temp 98.1°F | Resp 16 | Ht 68.0 in | Wt 225.9 lb

## 2018-08-26 DIAGNOSIS — G4733 Obstructive sleep apnea (adult) (pediatric): Secondary | ICD-10-CM

## 2018-08-26 DIAGNOSIS — E1121 Type 2 diabetes mellitus with diabetic nephropathy: Secondary | ICD-10-CM | POA: Diagnosis not present

## 2018-08-26 DIAGNOSIS — E1169 Type 2 diabetes mellitus with other specified complication: Secondary | ICD-10-CM | POA: Diagnosis not present

## 2018-08-26 DIAGNOSIS — I10 Essential (primary) hypertension: Secondary | ICD-10-CM | POA: Diagnosis not present

## 2018-08-26 DIAGNOSIS — E781 Pure hyperglyceridemia: Secondary | ICD-10-CM | POA: Diagnosis not present

## 2018-08-26 DIAGNOSIS — N529 Male erectile dysfunction, unspecified: Secondary | ICD-10-CM

## 2018-08-26 DIAGNOSIS — E7800 Pure hypercholesterolemia, unspecified: Secondary | ICD-10-CM

## 2018-08-26 DIAGNOSIS — I2510 Atherosclerotic heart disease of native coronary artery without angina pectoris: Secondary | ICD-10-CM

## 2018-08-26 DIAGNOSIS — E785 Hyperlipidemia, unspecified: Secondary | ICD-10-CM

## 2018-08-26 LAB — POCT GLYCOSYLATED HEMOGLOBIN (HGB A1C): HbA1c, POC (controlled diabetic range): 8 % — AB (ref 0.0–7.0)

## 2018-08-26 MED ORDER — SILDENAFIL CITRATE 100 MG PO TABS: 50.0000 mg | ORAL_TABLET | Freq: Every day | ORAL | 0 refills | Status: DC | PRN

## 2018-08-26 MED ORDER — ICOSAPENT ETHYL 1 G PO CAPS: ORAL_CAPSULE | ORAL | 5 refills | Status: DC

## 2018-08-26 MED ORDER — LISINOPRIL 10 MG PO TABS: 10.0000 mg | ORAL_TABLET | Freq: Every day | ORAL | 5 refills | Status: DC

## 2018-08-26 MED ORDER — ATORVASTATIN CALCIUM 80 MG PO TABS: 80.0000 mg | ORAL_TABLET | Freq: Every day | ORAL | 5 refills | Status: DC

## 2018-08-26 MED ORDER — EZETIMIBE 10 MG PO TABS: 10.0000 mg | ORAL_TABLET | Freq: Every day | ORAL | 6 refills | Status: DC

## 2018-08-26 MED ORDER — METOPROLOL SUCCINATE ER 25 MG PO TB24: 25.0000 mg | ORAL_TABLET | Freq: Every day | ORAL | 5 refills | Status: DC

## 2018-08-26 MED ORDER — INSULIN DEGLUDEC-LIRAGLUTIDE 100-3.6 UNIT-MG/ML ~~LOC~~ SOPN: 50.0000 [IU] | PEN_INJECTOR | Freq: Every day | SUBCUTANEOUS | 1 refills | Status: DC

## 2018-08-26 MED ORDER — DAPAGLIFLOZIN PRO-METFORMIN ER 5-1000 MG PO TB24: 1.0000 | ORAL_TABLET | Freq: Two times a day (BID) | ORAL | 5 refills | Status: DC

## 2018-08-26 MED ORDER — CLOPIDOGREL BISULFATE 75 MG PO TABS: 75.0000 mg | ORAL_TABLET | Freq: Every day | ORAL | 5 refills | Status: DC

## 2018-08-26 MED ORDER — FREESTYLE LIBRE 14 DAY SENSOR MISC: 1.0000 | 1 refills | Status: DC

## 2018-08-26 NOTE — Progress Notes (Signed)
Name: Jesse Lawrence.   MRN: [id number]    DOB: [birth year]   Date:08/26/2018       Progress Note  Subjective  Chief Complaint  Chief Complaint  Patient presents with  . Medication Refill    Needs refill on Lisinopril and Meloxicam  . Diabetes    Checks two times daily Average-130  . Insomnia  . Hypertension    Denies any symptoms  . Sleep Apnea  . Erectile Dysfunction    HPI  DM II : His hgbA1C has been trending up, 7.1% ,  7.9%  and now 8% He is still on Xigduo also and denies side effects of medications. Heis not compliant with diet, he loves fruit and likes to eat.  He has microalbuminuria and is on ACE. He has dyslipidemia, and is taking mediation. Last triglycerides was much better,on Vascepa and statin therapy. He also has ED. Discussed need to follow a diabetic diet and may be able to wean down insulin doses. Discussed trying to max carb intake to no more than 100 g per day.   Insomnia: he is doing better, struggled when his mother in law died and now lost his mother, but he states doing okay. He missed them and also his father in law that used to live in front of their house and now moved to [place].   HTN/CAD: no chest pain, no palpitation, bp istowards low end of normalno side effects of medication, including no dizziness, we will continue to monitor. He denies decrease in exercise tolerance. He is on Plavix, statin and Zetia., Vascepa. Still sees Dr. Nehemiah Massed Doing well at this time  ED: he has difficulty maintaining and erection, he used to take Cialis, now on viagra, we will send to [facility]   OSA: he has not been compliant with CPAP machine, he cannot tolerated. I explained importance of compliance with therapy.He has been waking up during the night. He still refuses having another study   Back spasms: he is doing well now, he states Baclofen stopped working, he is now taking skelaxin prn . Stable at this time  Patient Active Problem List    Diagnosis Date Noted  . OSA (obstructive sleep apnea) 05/21/2015  . Perennial allergic rhinitis 11/07/2014  . Arteriosclerosis of coronary artery 11/07/2014  . CD (contact dermatitis) 11/07/2014  . Decreased libido 11/07/2014  . Diabetes mellitus with renal manifestation ([place]) 11/07/2014  . Gastro-esophageal reflux disease without esophagitis 11/07/2014  . H/O acute myocardial infarction 11/07/2014  . Hypercholesteremia 11/07/2014  . Benign hypertension 11/07/2014  . Hypertriglyceridemia 11/07/2014  . H/O high risk medication treatment 11/07/2014  . NASH (nonalcoholic steatohepatitis) 11/07/2014  . Microalbuminuria 11/07/2014  . Adult BMI 30+ 11/07/2014  . Fungal infection of toenail 11/07/2014  . Tobacco abuse 11/07/2014  . ED (erectile dysfunction) of organic origin 09/29/2006    Past Surgical History:  Procedure Laterality Date  . cadiac stenting    . CYSTECTOMY     58 years of age  . KNEE ARTHROSCOPY      Family History  Problem Relation Age of Onset  . Diabetes Mother   . CAD Mother   . Heart disease Mother   . Heart attack Mother   . Heart disease Father   . Heart attack Father     Social History   Socioeconomic History  . Marital status: Married    Spouse name: Joseph Art  . Number of children: 4  . Years of education: Not on file  .  Highest education level: Not on file  Occupational History  . Not on file  Social Needs  . Financial resource strain: Not hard at all  . Food insecurity:    Worry: Never true    Inability: Never true  . Transportation needs:    Medical: No    Non-medical: No  Tobacco Use  . Smoking status: Former Smoker    Packs/day: 1.50    Years: 30.00    Pack years: 45.00    Types: Cigarettes    Last attempt to quit: 04/19/2013    Years since quitting: 5.3  . Smokeless tobacco: Current User  Substance and Sexual Activity  . Alcohol use: No    Alcohol/week: 0.0 standard drinks    Frequency: Never    Comment: occ  . Drug use: No   . Sexual activity: Yes    Partners: Female  Lifestyle  . Physical activity:    Days per week: 0 days    Minutes per session: 0 min  . Stress: Not at all  Relationships  . Social connections:    Talks on phone: More than three times a week    Gets together: More than three times a week    Attends religious service: More than 4 times per year    Active member of club or organization: Yes    Attends meetings of clubs or organizations: More than 4 times per year    Relationship status: Married  . Intimate partner violence:    Fear of current or ex partner: No    Emotionally abused: No    Physically abused: No    Forced sexual activity: No  Other Topics Concern  . Not on file  Social History Narrative  . Not on file     Current Outpatient Medications:  .  atorvastatin (LIPITOR) 80 MG tablet, Take 1 tablet (80 mg total) by mouth daily at 6 PM., Disp: 30 tablet, Rfl: 5 .  clonazePAM (KLONOPIN) 0.5 MG tablet, TAKE 1 TABLET BY MOUTH TWICE A DAY AS NEEDED FOR ANXIETY BEFORE FLIGHTS, Disp: 6 tablet, Rfl: 0 .  clopidogrel (PLAVIX) 75 MG tablet, Take 1 tablet (75 mg total) by mouth daily., Disp: 30 tablet, Rfl: 5 .  Dapagliflozin-metFORMIN HCl ER (XIGDUO XR) 08-998 MG TB24, Take 1 tablet by mouth 2 (two) times daily., Disp: 60 tablet, Rfl: 5 .  ezetimibe (ZETIA) 10 MG tablet, Take 1 tablet (10 mg total) by mouth daily., Disp: 30 tablet, Rfl: 6 .  famotidine (PEPCID) 20 MG tablet, Take 1 tablet (20 mg total) by mouth 2 (two) times daily., Disp: 60 tablet, Rfl: 5 .  fluticasone (FLONASE) 50 MCG/ACT nasal spray, Place 2 sprays into both nostrils as needed., Disp: 48 g, Rfl: 1 .  Icosapent Ethyl (VASCEPA) 1 g CAPS, TAKE 2 CAPSULES BY MOUTH 2 (TWO) TIMES DAILY., Disp: 120 capsule, Rfl: 5 .  insulin aspart (NOVOLOG FLEXPEN) 100 UNIT/ML FlexPen, Inject 4-10 Units into the skin 3 (three) times daily with meals. Pre-meal insulin :  Check fsbs prior to meals  Hold if below 110 If glucose 110- 140 give  4 units If glucose 140 to 180 give 6 units  If glucose 180 to 220  give 8 units If glucose above 220 give 10 units, Disp: 15 mL, Rfl: 2 .  Insulin Degludec-Liraglutide (XULTOPHY) 100-3.6 UNIT-MG/ML SOPN, Inject 50 Units into the skin daily., Disp: 15 mL, Rfl: 1 .  Insulin Pen Needle (BD PEN NEEDLE NANO U/F) 32G X 4 MM  MISC, USE AS DIRECTED. E11.29, Disp: 100 each, Rfl: 12 .  lisinopril (ZESTRIL) 10 MG tablet, Take 1 tablet (10 mg total) by mouth daily., Disp: 30 tablet, Rfl: 5 .  loratadine (CLARITIN) 10 MG tablet, Take 1 tablet by mouth daily., Disp: , Rfl:  .  meloxicam (MOBIC) 15 MG tablet, TAKE 1 TABLET BY MOUTH EVERY DAY, Disp: 30 tablet, Rfl: 0 .  metaxalone (SKELAXIN) 800 MG tablet, TAKE 1 TABLET (800 MG TOTAL) BY MOUTH 3 (THREE) TIMES DAILY AS NEEDED FOR MUSCLE SPASMS., Disp: 40 tablet, Rfl: 0 .  metoprolol succinate (TOPROL-XL) 25 MG 24 hr tablet, Take 1 tablet (25 mg total) by mouth daily., Disp: 30 tablet, Rfl: 5 .  ONE TOUCH ULTRA TEST test strip, USE AS DIRECTED, Disp: 100 each, Rfl: 11 .  traZODone (DESYREL) 50 MG tablet, TAKE 0.5-2 TABLETS (25-100 MG TOTAL) BY MOUTH AT BEDTIME AS NEEDED FOR SLEEP., Disp: 60 tablet, Rfl: 0 .  triamcinolone cream (KENALOG) 0.1 %, Apply topically 2 (two) times daily. Apply to arms, Disp: 60 g, Rfl: 0 .  Continuous Blood Gluc Sensor (FREESTYLE LIBRE 14 DAY SENSOR) MISC, 1 each by Does not apply route every 14 (fourteen) days., Disp: 2 each, Rfl: 1 .  sildenafil (VIAGRA) 100 MG tablet, Take 0.5-1 tablets (50-100 mg total) by mouth daily as needed for erectile dysfunction., Disp: 30 tablet, Rfl: 0  No Known Allergies  I personally reviewed active problem list, medication list, allergies, family history, social history with the patient/caregiver today.   ROS  Constitutional: Negative for fever or weight change.  Respiratory: Negative for cough and shortness of breath.   Cardiovascular: Negative for chest pain or palpitations.  Gastrointestinal:  Negative for abdominal pain, no bowel changes.  Musculoskeletal: Negative for gait problem or joint swelling.  Skin: Negative for rash.  Neurological: Negative for dizziness or headache.  No other specific complaints in a complete review of systems (except as listed in HPI above).  Objective  Vitals:   08/26/18 0834  BP: 128/74  Pulse: 89  Resp: 16  Temp: 98.1 F (36.7 C)  TempSrc: Oral  SpO2: 98%  Weight: 225 lb 14.4 oz (102.5 kg)  Height: 5' 8"  (1.727 m)    Body mass index is 34.35 kg/m.  Physical Exam  Constitutional: Patient appears well-developed and well-nourished. Obese  No distress.  HEENT: head atraumatic, normocephalic, pupils equal and reactive to light, , neck supple, throat within normal limits Cardiovascular: Normal rate, regular rhythm and normal heart sounds.  No murmur heard. No BLE edema. Pulmonary/Chest: Effort normal and breath sounds normal. No respiratory distress. Abdominal: Soft.  There is no tenderness. Psychiatric: Patient has a normal mood and affect. behavior is normal. Judgment and thought content normal.  Recent Results (from the past 2160 hour(s))  POCT HgB A1C     Status: Abnormal   Collection Time: 05/28/18 11:15 AM  Result Value Ref Range   Hemoglobin A1C 7.9 (A) 4.0 - 5.6 %   HbA1c POC (<> result, manual entry)     HbA1c, POC (prediabetic range)     HbA1c, POC (controlled diabetic range)    POCT HgB A1C     Status: Abnormal   Collection Time: 08/26/18  8:41 AM  Result Value Ref Range   Hemoglobin A1C     HbA1c POC (<> result, manual entry)     HbA1c, POC (prediabetic range)     HbA1c, POC (controlled diabetic range) 8.0 (A) 0.0 - 7.0 %  PHQ2/9: Depression screen [facility] 2/9 08/26/2018 05/28/2018 02/26/2018 11/20/2017 08/14/2017  Decreased Interest 0 0 0 0 0  Down, Depressed, Hopeless 0 0 0 0 0  PHQ - 2 Score 0 0 0 0 0  Altered sleeping 0 2 0 - -  Tired, decreased energy 0 1 0 - -  Change in appetite 0 2 0 - -  Feeling bad or  failure about yourself  0 0 0 - -  Trouble concentrating 0 0 0 - -  Moving slowly or fidgety/restless 0 0 0 - -  Suicidal thoughts 0 0 0 - -  PHQ-9 Score 0 5 0 - -  Difficult doing work/chores Not difficult at all Not difficult at all Not difficult at all - -    phq 9 is negative   Fall Risk: Fall Risk  08/26/2018 02/26/2018 11/20/2017 08/14/2017 07/30/2016  Falls in the past year? 0 0 No No No  Number falls in past yr: 0 - - - -  Injury with Fall? 0 - - - -      Functional Status Survey: Is the patient deaf or have difficulty hearing?: No Does the patient have difficulty seeing, even when wearing glasses/contacts?: No Does the patient have difficulty concentrating, remembering, or making decisions?: No Does the patient have difficulty walking or climbing stairs?: No Does the patient have difficulty dressing or bathing?: No Does the patient have difficulty doing errands alone such as visiting a doctor's office or shopping?: No   Assessment & Plan   1. Type 2 diabetes mellitus with diabetic nephropathy, without long-term current use of insulin (HCC)  - POCT HgB A1C - Urine Microalbumin w/creat. ratio - lisinopril (ZESTRIL) 10 MG tablet; Take 1 tablet (10 mg total) by mouth daily.  Dispense: 30 tablet; Refill: 5 - Dapagliflozin-metFORMIN HCl ER (XIGDUO XR) 08-998 MG TB24; Take 1 tablet by mouth 2 (two) times daily.  Dispense: 60 tablet; Refill: 5 - Insulin Degludec-Liraglutide (XULTOPHY) 100-3.6 UNIT-MG/ML SOPN; Inject 50 Units into the skin daily.  Dispense: 15 mL; Refill: 1 - Continuous Blood Gluc Sensor (FREESTYLE LIBRE 14 DAY SENSOR) MISC; 1 each by Does not apply route every 14 (fourteen) days.  Dispense: 2 each; Refill: 1  2. Benign hypertension  - lisinopril (ZESTRIL) 10 MG tablet; Take 1 tablet (10 mg total) by mouth daily.  Dispense: 30 tablet; Refill: 5 - metoprolol succinate (TOPROL-XL) 25 MG 24 hr tablet; Take 1 tablet (25 mg total) by mouth daily.  Dispense: 30  tablet; Refill: 5  3. Hypertriglyceridemia  - Icosapent Ethyl (VASCEPA) 1 g CAPS; TAKE 2 CAPSULES BY MOUTH 2 (TWO) TIMES DAILY.  Dispense: 120 capsule; Refill: 5  4. Dyslipidemia associated with type 2 diabetes mellitus (HCC)  - Icosapent Ethyl (VASCEPA) 1 g CAPS; TAKE 2 CAPSULES BY MOUTH 2 (TWO) TIMES DAILY.  Dispense: 120 capsule; Refill: 5 - Dapagliflozin-metFORMIN HCl ER (XIGDUO XR) 08-998 MG TB24; Take 1 tablet by mouth 2 (two) times daily.  Dispense: 60 tablet; Refill: 5 - Insulin Degludec-Liraglutide (XULTOPHY) 100-3.6 UNIT-MG/ML SOPN; Inject 50 Units into the skin daily.  Dispense: 15 mL; Refill: 1 - Continuous Blood Gluc Sensor (FREESTYLE LIBRE 14 DAY SENSOR) MISC; 1 each by Does not apply route every 14 (fourteen) days.  Dispense: 2 each; Refill: 1  5. Arteriosclerosis of coronary artery  - clopidogrel (PLAVIX) 75 MG tablet; Take 1 tablet (75 mg total) by mouth daily.  Dispense: 30 tablet; Refill: 5 - ezetimibe (ZETIA) 10 MG tablet; Take 1 tablet (10 mg  total) by mouth daily.  Dispense: 30 tablet; Refill: 6 - metoprolol succinate (TOPROL-XL) 25 MG 24 hr tablet; Take 1 tablet (25 mg total) by mouth daily.  Dispense: 30 tablet; Refill: 5  6. Hypercholesteremia  - atorvastatin (LIPITOR) 80 MG tablet; Take 1 tablet (80 mg total) by mouth daily at 6 PM.  Dispense: 30 tablet; Refill: 5 - ezetimibe (ZETIA) 10 MG tablet; Take 1 tablet (10 mg total) by mouth daily.  Dispense: 30 tablet; Refill: 6  7. OSA (obstructive sleep apnea)  Not on CPAP   8. ED (erectile dysfunction) of organic origin  - sildenafil (VIAGRA) 100 MG tablet; Take 0.5-1 tablets (50-100 mg total) by mouth daily as needed for erectile dysfunction.  Dispense: 30 tablet; Refill: 0

## 2018-08-27 LAB — MICROALBUMIN / CREATININE URINE RATIO
Creatinine, Urine: 60 mg/dL (ref 20–320)
Microalb Creat Ratio: 73 mcg/mg creat — ABNORMAL HIGH (ref <30)
Microalb, Ur: 4.4 mg/dL

## 2018-09-21 ENCOUNTER — Other Ambulatory Visit: Payer: Self-pay | Admitting: Family Medicine

## 2018-09-21 DIAGNOSIS — G4709 Other insomnia: Secondary | ICD-10-CM

## 2018-11-07 ENCOUNTER — Other Ambulatory Visit: Payer: Self-pay | Admitting: Family Medicine

## 2018-11-07 DIAGNOSIS — E785 Hyperlipidemia, unspecified: Secondary | ICD-10-CM

## 2018-11-07 DIAGNOSIS — E1169 Type 2 diabetes mellitus with other specified complication: Secondary | ICD-10-CM

## 2018-11-07 DIAGNOSIS — E1121 Type 2 diabetes mellitus with diabetic nephropathy: Secondary | ICD-10-CM

## 2018-11-09 ENCOUNTER — Ambulatory Visit (INDEPENDENT_AMBULATORY_CARE_PROVIDER_SITE_OTHER): Payer: 59 | Admitting: Family Medicine

## 2018-11-09 ENCOUNTER — Encounter: Payer: Self-pay | Admitting: Family Medicine

## 2018-11-09 ENCOUNTER — Other Ambulatory Visit: Payer: Self-pay

## 2018-11-09 DIAGNOSIS — E1169 Type 2 diabetes mellitus with other specified complication: Secondary | ICD-10-CM

## 2018-11-09 DIAGNOSIS — G43009 Migraine without aura, not intractable, without status migrainosus: Secondary | ICD-10-CM | POA: Diagnosis not present

## 2018-11-09 DIAGNOSIS — E785 Hyperlipidemia, unspecified: Secondary | ICD-10-CM

## 2018-11-09 MED ORDER — PREDNISONE 10 MG PO TABS: 10.0000 mg | ORAL_TABLET | Freq: Two times a day (BID) | ORAL | 0 refills | Status: DC

## 2018-11-09 MED ORDER — BACLOFEN 10 MG PO TABS: 10.0000 mg | ORAL_TABLET | Freq: Four times a day (QID) | ORAL | 0 refills | Status: DC

## 2018-11-09 MED ORDER — NORTRIPTYLINE HCL 25 MG PO CAPS: 25.0000 mg | ORAL_CAPSULE | Freq: Every day | ORAL | 0 refills | Status: DC

## 2018-11-09 NOTE — Progress Notes (Signed)
Name: Jesse Lawrence.   MRN: [id number]    DOB: 1960/04/27   Date:11/09/2018       Progress Note  Subjective  Chief Complaint  Chief Complaint  Patient presents with  . Headache    Onset Friday night    I connected with  [name].  on 11/09/18 at  2:00 PM EDT by a video enabled telemedicine application and verified that I am speaking with the correct person using two identifiers.  I discussed the limitations of evaluation and management by telemedicine and the availability of in person appointments. The patient expressed understanding and agreed to proceed. Staff also discussed with the patient that there may be a patient responsible charge related to this service. Patient Location: at work  Provider Location: [facility]   HPI  Migraine headache: he states he has a history of migraines, used to [facility] to [facility] when severe and has seen a headache specialist about 20 years ago. No episodes in years, however this episodes started 4 days ago , pain on left nuchal area that radiated to his left side of head to frontal area, no rashes, no nausea or vomiting, but has noticed photophobia and phonophobia. Naproxen improves symptoms temporarily, no focal findings and has been able to go to work.   DMII: he is on a keto diet and states glucose has been well controlled in the 80's-130's fasting, explained prednisone will increase glucose for the next few days.    Patient Active Problem List   Diagnosis Date Noted  . OSA (obstructive sleep apnea) 05/21/2015  . Perennial allergic rhinitis 11/07/2014  . Arteriosclerosis of coronary artery 11/07/2014  . CD (contact dermatitis) 11/07/2014  . Decreased libido 11/07/2014  . Diabetes mellitus with renal manifestation ([place]) 11/07/2014  . Gastro-esophageal reflux disease without esophagitis 11/07/2014  . H/O acute myocardial infarction 11/07/2014  . Hypercholesteremia 11/07/2014  . Benign hypertension 11/07/2014  .  Hypertriglyceridemia 11/07/2014  . H/O high risk medication treatment 11/07/2014  . NASH (nonalcoholic steatohepatitis) 11/07/2014  . Microalbuminuria 11/07/2014  . Adult BMI 30+ 11/07/2014  . Fungal infection of toenail 11/07/2014  . Tobacco abuse 11/07/2014  . ED (erectile dysfunction) of organic origin 09/29/2006    Past Surgical History:  Procedure Laterality Date  . cadiac stenting    . CYSTECTOMY     58 years of age  . KNEE ARTHROSCOPY      Family History  Problem Relation Age of Onset  . Diabetes Mother   . CAD Mother   . Heart disease Mother   . Heart attack Mother   . Heart disease Father   . Heart attack Father     Social History   Socioeconomic History  . Marital status: Married    Spouse name: Joseph Art  . Number of children: 4  . Years of education: Not on file  . Highest education level: Not on file  Occupational History  . Not on file  Social Needs  . Financial resource strain: Not hard at all  . Food insecurity    Worry: Never true    Inability: Never true  . Transportation needs    Medical: No    Non-medical: No  Tobacco Use  . Smoking status: Former Smoker    Packs/day: 1.50    Years: 30.00    Pack years: 45.00    Types: Cigarettes    Quit date: 04/19/2013    Years since quitting: 5.5  . Smokeless tobacco: Current User  Substance and Sexual Activity  . Alcohol use: No    Alcohol/week: 0.0 standard drinks    Frequency: Never    Comment: occ  . Drug use: No  . Sexual activity: Yes    Partners: Female  Lifestyle  . Physical activity    Days per week: 0 days    Minutes per session: 0 min  . Stress: Not at all  Relationships  . Social connections    Talks on phone: More than three times a week    Gets together: More than three times a week    Attends religious service: More than 4 times per year    Active member of club or organization: Yes    Attends meetings of clubs or organizations: More than 4 times per year    Relationship  status: Married  . Intimate partner violence    Fear of current or ex partner: No    Emotionally abused: No    Physically abused: No    Forced sexual activity: No  Other Topics Concern  . Not on file  Social History Narrative  . Not on file     Current Outpatient Medications:  .  atorvastatin (LIPITOR) 80 MG tablet, Take 1 tablet (80 mg total) by mouth daily at 6 PM., Disp: 30 tablet, Rfl: 5 .  clopidogrel (PLAVIX) 75 MG tablet, Take 1 tablet (75 mg total) by mouth daily., Disp: 30 tablet, Rfl: 5 .  Dapagliflozin-metFORMIN HCl ER (XIGDUO XR) 08-998 MG TB24, Take 1 tablet by mouth 2 (two) times daily., Disp: 60 tablet, Rfl: 5 .  ezetimibe (ZETIA) 10 MG tablet, Take 1 tablet (10 mg total) by mouth daily., Disp: 30 tablet, Rfl: 6 .  famotidine (PEPCID) 20 MG tablet, Take 1 tablet (20 mg total) by mouth 2 (two) times daily., Disp: 60 tablet, Rfl: 5 .  fluticasone (FLONASE) 50 MCG/ACT nasal spray, Place 2 sprays into both nostrils as needed., Disp: 48 g, Rfl: 1 .  Icosapent Ethyl (VASCEPA) 1 g CAPS, TAKE 2 CAPSULES BY MOUTH 2 (TWO) TIMES DAILY., Disp: 120 capsule, Rfl: 5 .  lisinopril (ZESTRIL) 10 MG tablet, Take 1 tablet (10 mg total) by mouth daily., Disp: 30 tablet, Rfl: 5 .  loratadine (CLARITIN) 10 MG tablet, Take 1 tablet by mouth daily., Disp: , Rfl:  .  meloxicam (MOBIC) 15 MG tablet, TAKE 1 TABLET BY MOUTH EVERY DAY, Disp: 30 tablet, Rfl: 0 .  metaxalone (SKELAXIN) 800 MG tablet, TAKE 1 TABLET (800 MG TOTAL) BY MOUTH 3 (THREE) TIMES DAILY AS NEEDED FOR MUSCLE SPASMS., Disp: 40 tablet, Rfl: 0 .  metoprolol succinate (TOPROL-XL) 25 MG 24 hr tablet, Take 1 tablet (25 mg total) by mouth daily., Disp: 30 tablet, Rfl: 5 .  sildenafil (VIAGRA) 100 MG tablet, Take 0.5-1 tablets (50-100 mg total) by mouth daily as needed for erectile dysfunction., Disp: 30 tablet, Rfl: 0 .  traZODone (DESYREL) 50 MG tablet, TAKE 1/2-2 TABLETS (25-100 MG TOTAL) BY MOUTH AT BEDTIME AS NEEDED FOR SLEEP., Disp: 60  tablet, Rfl: 0 .  triamcinolone cream (KENALOG) 0.1 %, Apply topically 2 (two) times daily. Apply to arms, Disp: 60 g, Rfl: 0 .  XULTOPHY 100-3.6 UNIT-MG/ML SOPN, INJECT 50 UNITS INTO THE SKIN DAILY., Disp: 15 mL, Rfl: 0 .  clonazePAM (KLONOPIN) 0.5 MG tablet, TAKE 1 TABLET BY MOUTH TWICE A DAY AS NEEDED FOR ANXIETY BEFORE FLIGHTS (Patient not taking: Reported on 11/09/2018), Disp: 6 tablet, Rfl: 0 .  Continuous Blood Gluc Sensor (FREESTYLE  [place]) MISC, 1 each by Does not apply route every 14 (fourteen) days., Disp: 2 each, Rfl: 1 .  insulin aspart (NOVOLOG FLEXPEN) 100 UNIT/ML FlexPen, Inject 4-10 Units into the skin 3 (three) times daily with meals. Pre-meal insulin :  Check fsbs prior to meals  Hold if below 110 If glucose 110- 140 give 4 units If glucose 140 to 180 give 6 units  If glucose 180 to 220  give 8 units If glucose above 220 give 10 units (Patient not taking: Reported on 11/09/2018), Disp: 15 mL, Rfl: 2 .  Insulin Pen Needle (BD PEN NEEDLE NANO U/F) 32G X 4 MM MISC, USE AS DIRECTED. E11.29, Disp: 100 each, Rfl: 12 .  ONE TOUCH ULTRA TEST test strip, USE AS DIRECTED, Disp: 100 each, Rfl: 11  No Known Allergies  I personally reviewed active problem list, medication list, allergies, family history, social history with the patient/caregiver today.   ROS  Left elbow pain a few weeks ago, possible tendinitis. We will monitor   Objective  Virtual encounter, vitals not obtained.  There is no height or weight on file to calculate BMI.  Physical Exam  Awake, alert and oriented Normal gait Cranial nerves intact ( unable to check sensation)   PHQ2/9: Depression screen [facility] 2/9 11/09/2018 08/26/2018 05/28/2018 02/26/2018 11/20/2017  Decreased Interest 0 0 0 0 0  Down, Depressed, Hopeless 0 0 0 0 0  PHQ - 2 Score 0 0 0 0 0  Altered sleeping 0 0 2 0 -  Tired, decreased energy 0 0 1 0 -  Change in appetite 0 0 2 0 -  Feeling bad or failure about yourself  0 0 0 0 -  Trouble  concentrating 0 0 0 0 -  Moving slowly or fidgety/restless 0 0 0 0 -  Suicidal thoughts 0 0 0 0 -  PHQ-9 Score 0 0 5 0 -  Difficult doing work/chores Not difficult at all Not difficult at all Not difficult at all Not difficult at all -   PHQ-2/9 Result is negative.    Fall Risk: Fall Risk  11/09/2018 08/26/2018 02/26/2018 11/20/2017 08/14/2017  Falls in the past year? 0 0 0 No No  Number falls in past yr: 0 0 - - -  Injury with Fall? 0 0 - - -     Assessment & Plan  1. Migraine without aura and without status migrainosus, not intractable  - predniSONE (DELTASONE) 10 MG tablet; Take 1 tablet (10 mg total) by mouth 2 (two) times daily with a meal.  Dispense: 3 tablet; Refill: 0 - baclofen (LIORESAL) 10 MG tablet; Take 1 tablet (10 mg total) by mouth 4 (four) times daily. For 2 days and prn after that  Dispense: 30 each; Refill: 0 - nortriptyline (PAMELOR) 25 MG capsule; Take 1 capsule (25 mg total) by mouth at bedtime.  Dispense: 30 capsule; Refill: 0  2. Dyslipidemia associated with type 2 diabetes mellitus ([place])  I discussed the assessment and treatment plan with the patient. The patient was provided an opportunity to ask questions and all were answered. The patient agreed with the plan and demonstrated an understanding of the instructions.  The patient was advised to call back or seek an in-person evaluation if the symptoms worsen or if the condition fails to improve as anticipated.  I provided 25 minutes of non-face-to-face time during this encounter.

## 2018-11-26 ENCOUNTER — Other Ambulatory Visit: Payer: Self-pay

## 2018-11-26 ENCOUNTER — Encounter: Payer: Self-pay | Admitting: Family Medicine

## 2018-11-26 ENCOUNTER — Ambulatory Visit: Payer: 59 | Admitting: Family Medicine

## 2018-11-26 VITALS — BP 90/62 | HR 94 | Temp 97.5°F | Resp 16 | Ht 68.0 in | Wt 218.4 lb

## 2018-11-26 DIAGNOSIS — T753XXA Motion sickness, initial encounter: Secondary | ICD-10-CM

## 2018-11-26 DIAGNOSIS — I10 Essential (primary) hypertension: Secondary | ICD-10-CM | POA: Diagnosis not present

## 2018-11-26 DIAGNOSIS — N529 Male erectile dysfunction, unspecified: Secondary | ICD-10-CM

## 2018-11-26 DIAGNOSIS — E785 Hyperlipidemia, unspecified: Secondary | ICD-10-CM

## 2018-11-26 DIAGNOSIS — I25118 Atherosclerotic heart disease of native coronary artery with other forms of angina pectoris: Secondary | ICD-10-CM

## 2018-11-26 DIAGNOSIS — G43009 Migraine without aura, not intractable, without status migrainosus: Secondary | ICD-10-CM | POA: Diagnosis not present

## 2018-11-26 DIAGNOSIS — E1121 Type 2 diabetes mellitus with diabetic nephropathy: Secondary | ICD-10-CM | POA: Diagnosis not present

## 2018-11-26 DIAGNOSIS — K7581 Nonalcoholic steatohepatitis (NASH): Secondary | ICD-10-CM

## 2018-11-26 DIAGNOSIS — E1169 Type 2 diabetes mellitus with other specified complication: Secondary | ICD-10-CM

## 2018-11-26 DIAGNOSIS — G4733 Obstructive sleep apnea (adult) (pediatric): Secondary | ICD-10-CM

## 2018-11-26 LAB — POCT GLYCOSYLATED HEMOGLOBIN (HGB A1C): HbA1c, POC (controlled diabetic range): 5.7 % (ref 0.0–7.0)

## 2018-11-26 MED ORDER — SCOPOLAMINE 1 MG/3DAYS TD PT72: 1.0000 | MEDICATED_PATCH | TRANSDERMAL | 0 refills | Status: DC

## 2018-11-26 MED ORDER — LISINOPRIL 2.5 MG PO TABS: 2.5000 mg | ORAL_TABLET | Freq: Every day | ORAL | 2 refills | Status: DC

## 2018-11-26 NOTE — Progress Notes (Signed)
Name: Jesse Lawrence.   MRN: [id number]    DOB: 18-Mar-1961   Date:11/26/2018       Progress Note  Subjective  Chief Complaint  Chief Complaint  Patient presents with  . Medication Refill  . Diabetes    Has lost some weight by watching his carbs intake- 3 to 4 times daily Lowest-60 Average-90 Highest-180  . Insomnia    Unchanged  . Hypertension    Headaches   . Erectile Dysfunction  . Sleep Apnea  . Migraine    HPI   DM II : His hgbA1C was trending up, 7.1% ,  7.9% and now 8% on his last visit we discussed restricting carbohydrates and he is doing very well with dietary modification, he was able to stop using novolog, he is on Xigduo 5/100 two daily and also xulttophy 50 units and glucose is dropping in am's 60's at times, most of the time below 100, advised to back down on xultophy by 2 units every two days to keep level between 100-120, and if he gets down to 30 units of xultophy we will switch him to Ozempic   Insomnia: he is doing well now, still not elavil as prescribed for headaches before bed, having daily headache, explained not addicting and it will improve both symptoms.   HTN/CAD: no chest pain, no palpitation, bp is very low today, losing weight, changed diet, we will decrease lisinorpil to 2.5 mg daily from 10 mg and monitor\. He is on Plavix, statin and Zetia., Vascepa., beta-blocker also  Still sees Dr. Nehemiah Massed Doing well at this time  ED: he has difficulty maintaining and erection, he used to take Cialis, now on viagra, and doing well.   OSA: he has not been compliant with CPAP machine, he cannot tolerated. I explained importance of compliance with therapy.He has been waking up during the night. He still refuses having another study . Unchanged   Migraine headache: he states he has a history of migraines, used to go to [facility] when severe and has seen a headache specialist about 20 years ago. No episodes in years, however had a severe episode at the beginning  of August and call us . Pain was on left nuchal area that radiated to his left side of head to frontal area, no rashes, no nausea or vomiting, but has noticed photophobia and phonophobia. We gave him Elavil and prednisone and baclofen, symptoms resolved after he finished medication ( 3 days ) but now having some tension type nuchal pain, advised to resume elavil and baclofen   Patient Active Problem List   Diagnosis Date Noted  . OSA (obstructive sleep apnea) 05/21/2015  . Perennial allergic rhinitis 11/07/2014  . Arteriosclerosis of coronary artery 11/07/2014  . CD (contact dermatitis) 11/07/2014  . Decreased libido 11/07/2014  . Diabetes mellitus with renal manifestation ([place]) 11/07/2014  . Gastro-esophageal reflux disease without esophagitis 11/07/2014  . H/O acute myocardial infarction 11/07/2014  . Hypercholesteremia 11/07/2014  . Benign hypertension 11/07/2014  . Hypertriglyceridemia 11/07/2014  . H/O high risk medication treatment 11/07/2014  . NASH (nonalcoholic steatohepatitis) 11/07/2014  . Microalbuminuria 11/07/2014  . Adult BMI 30+ 11/07/2014  . Fungal infection of toenail 11/07/2014  . Tobacco abuse 11/07/2014  . ED (erectile dysfunction) of organic origin 09/29/2006    Past Surgical History:  Procedure Laterality Date  . cadiac stenting    . CYSTECTOMY     58 years of age  . KNEE ARTHROSCOPY      Family History  Problem Relation Age of Onset  . Diabetes Mother   . CAD Mother   . Heart disease Mother   . Heart attack Mother   . Heart disease Father   . Heart attack Father     Social History   Socioeconomic History  . Marital status: Married    Spouse name: Joseph Art  . Number of children: 4  . Years of education: Not on file  . Highest education level: Not on file  Occupational History  . Not on file  Social Needs  . Financial resource strain: Not hard at all  . Food insecurity    Worry: Never true    Inability: Never true  . Transportation needs     Medical: No    Non-medical: No  Tobacco Use  . Smoking status: Former Smoker    Packs/day: 1.50    Years: 30.00    Pack years: 45.00    Types: Cigarettes    Quit date: 04/19/2013    Years since quitting: 5.6  . Smokeless tobacco: Current User  Substance and Sexual Activity  . Alcohol use: No    Alcohol/week: 0.0 standard drinks    Frequency: Never    Comment: occ  . Drug use: No  . Sexual activity: Yes    Partners: Female  Lifestyle  . Physical activity    Days per week: 0 days    Minutes per session: 0 min  . Stress: Not at all  Relationships  . Social connections    Talks on phone: More than three times a week    Gets together: More than three times a week    Attends religious service: More than 4 times per year    Active member of club or organization: Yes    Attends meetings of clubs or organizations: More than 4 times per year    Relationship status: Married  . Intimate partner violence    Fear of current or ex partner: No    Emotionally abused: No    Physically abused: No    Forced sexual activity: No  Other Topics Concern  . Not on file  Social History Narrative  . Not on file     Current Outpatient Medications:  .  atorvastatin (LIPITOR) 80 MG tablet, Take 1 tablet (80 mg total) by mouth daily at 6 PM., Disp: 30 tablet, Rfl: 5 .  baclofen (LIORESAL) 10 MG tablet, Take 1 tablet (10 mg total) by mouth 4 (four) times daily. For 2 days and prn after that, Disp: 30 each, Rfl: 0 .  clonazePAM (KLONOPIN) 0.5 MG tablet, TAKE 1 TABLET BY MOUTH TWICE A DAY AS NEEDED FOR ANXIETY BEFORE FLIGHTS, Disp: 6 tablet, Rfl: 0 .  clopidogrel (PLAVIX) 75 MG tablet, Take 1 tablet (75 mg total) by mouth daily., Disp: 30 tablet, Rfl: 5 .  Continuous Blood Gluc Sensor (FREESTYLE LIBRE 14 DAY SENSOR) MISC, 1 each by Does not apply route every 14 (fourteen) days., Disp: 2 each, Rfl: 1 .  Dapagliflozin-metFORMIN HCl ER (XIGDUO XR) 08-998 MG TB24, Take 1 tablet by mouth 2 (two) times  daily., Disp: 60 tablet, Rfl: 5 .  ezetimibe (ZETIA) 10 MG tablet, Take 1 tablet (10 mg total) by mouth daily., Disp: 30 tablet, Rfl: 6 .  famotidine (PEPCID) 20 MG tablet, Take 1 tablet (20 mg total) by mouth 2 (two) times daily., Disp: 60 tablet, Rfl: 5 .  fluticasone (FLONASE) 50 MCG/ACT nasal spray, Place 2 sprays into both nostrils as needed., Disp:  48 g, Rfl: 1 .  Icosapent Ethyl (VASCEPA) 1 g CAPS, TAKE 2 CAPSULES BY MOUTH 2 (TWO) TIMES DAILY., Disp: 120 capsule, Rfl: 5 .  Insulin Pen Needle (BD PEN NEEDLE NANO U/F) 32G X 4 MM MISC, USE AS DIRECTED. E11.29, Disp: 100 each, Rfl: 12 .  lisinopril (ZESTRIL) 10 MG tablet, Take 1 tablet (10 mg total) by mouth daily., Disp: 30 tablet, Rfl: 5 .  loratadine (CLARITIN) 10 MG tablet, Take 1 tablet by mouth daily., Disp: , Rfl:  .  meloxicam (MOBIC) 15 MG tablet, TAKE 1 TABLET BY MOUTH EVERY DAY, Disp: 30 tablet, Rfl: 0 .  metaxalone (SKELAXIN) 800 MG tablet, TAKE 1 TABLET (800 MG TOTAL) BY MOUTH 3 (THREE) TIMES DAILY AS NEEDED FOR MUSCLE SPASMS., Disp: 40 tablet, Rfl: 0 .  metoprolol succinate (TOPROL-XL) 25 MG 24 hr tablet, Take 1 tablet (25 mg total) by mouth daily., Disp: 30 tablet, Rfl: 5 .  nortriptyline (PAMELOR) 25 MG capsule, Take 1 capsule (25 mg total) by mouth at bedtime., Disp: 30 capsule, Rfl: 0 .  ONE TOUCH ULTRA TEST test strip, USE AS DIRECTED, Disp: 100 each, Rfl: 11 .  predniSONE (DELTASONE) 10 MG tablet, Take 1 tablet (10 mg total) by mouth 2 (two) times daily with a meal., Disp: 3 tablet, Rfl: 0 .  sildenafil (VIAGRA) 100 MG tablet, Take 0.5-1 tablets (50-100 mg total) by mouth daily as needed for erectile dysfunction., Disp: 30 tablet, Rfl: 0 .  traZODone (DESYREL) 50 MG tablet, TAKE 1/2-2 TABLETS (25-100 MG TOTAL) BY MOUTH AT BEDTIME AS NEEDED FOR SLEEP., Disp: 60 tablet, Rfl: 0 .  triamcinolone cream (KENALOG) 0.1 %, Apply topically 2 (two) times daily. Apply to arms, Disp: 60 g, Rfl: 0 .  XULTOPHY 100-3.6 UNIT-MG/ML SOPN, INJECT  50 UNITS INTO THE SKIN DAILY., Disp: 15 mL, Rfl: 0 .  insulin aspart (NOVOLOG FLEXPEN) 100 UNIT/ML FlexPen, Inject 4-10 Units into the skin 3 (three) times daily with meals. Pre-meal insulin :  Check fsbs prior to meals  Hold if below 110 If glucose 110- 140 give 4 units If glucose 140 to 180 give 6 units  If glucose 180 to 220  give 8 units If glucose above 220 give 10 units (Patient not taking: Reported on 11/26/2018), Disp: 15 mL, Rfl: 2  No Known Allergies  I personally reviewed active problem list, medication list, allergies, family history, social history with the patient/caregiver today.   ROS  Constitutional: Negative for fever, positive for  weight change.  Respiratory: Negative for cough and shortness of breath.   Cardiovascular: Negative for chest pain or palpitations.  Gastrointestinal: Negative for abdominal pain, no bowel changes.  Musculoskeletal: Negative for gait problem or joint swelling.  Skin: Negative for rash.  Neurological: positive  for dizziness or headache.  No other specific complaints in a complete review of systems (except as listed in HPI above).  Objective  Vitals:   11/26/18 1530 11/26/18 1541  BP: (!) 108/58 90/62  Pulse: 94   Resp: 16   Temp: (!) 97.5 F (36.4 C)   TempSrc: Temporal   SpO2: 95%   Weight: 218 lb 6.4 oz (99.1 kg)   Height: 5' 8"  (1.727 m)     Body mass index is 33.21 kg/m.  Physical Exam  Constitutional: Patient appears well-developed and well-nourished. Obese  No distress.  HEENT: head atraumatic, normocephalic, pupils equal and reactive to light,  neck supple Cardiovascular: Normal rate, regular rhythm and normal heart sounds.  No murmur  heard. No BLE edema. Pulmonary/Chest: Effort normal and breath sounds normal. No respiratory distress. Abdominal: Soft.  There is no tenderness. Psychiatric: Patient has a normal mood and affect. behavior is normal. Judgment and thought content normal.  PHQ2/9: Depression screen [facility] 2/9  11/09/2018 08/26/2018 05/28/2018 02/26/2018 11/20/2017  Decreased Interest 0 0 0 0 0  Down, Depressed, Hopeless 0 0 0 0 0  PHQ - 2 Score 0 0 0 0 0  Altered sleeping 0 0 2 0 -  Tired, decreased energy 0 0 1 0 -  Change in appetite 0 0 2 0 -  Feeling bad or failure about yourself  0 0 0 0 -  Trouble concentrating 0 0 0 0 -  Moving slowly or fidgety/restless 0 0 0 0 -  Suicidal thoughts 0 0 0 0 -  PHQ-9 Score 0 0 5 0 -  Difficult doing work/chores Not difficult at all Not difficult at all Not difficult at all Not difficult at all -    phq 9 is negative   Fall Risk: Fall Risk  11/26/2018 11/09/2018 08/26/2018 02/26/2018 11/20/2017  Falls in the past year? 0 0 0 0 No  Number falls in past yr: 0 0 0 - -  Injury with Fall? 0 0 0 - -     Functional Status Survey: Is the patient deaf or have difficulty hearing?: No Does the patient have difficulty seeing, even when wearing glasses/contacts?: No Does the patient have difficulty concentrating, remembering, or making decisions?: No Does the patient have difficulty walking or climbing stairs?: No Does the patient have difficulty dressing or bathing?: No Does the patient have difficulty doing errands alone such as visiting a doctor's office or shopping?: No   Assessment & Plan  1. Type 2 diabetes mellitus with diabetic nephropathy, without long-term current use of insulin (HCC)  - POCT HgB A1C Titrate down xultophy   2. Dyslipidemia associated with type 2 diabetes mellitus ([place])   3. Migraine without aura and without status migrainosus, not intractable  Still has symptoms but afraid to take elavil   4. Benign hypertension  His bp is low now, taking metoprolol for heart protection and is having some orthostatic changes   5. OSA (obstructive sleep apnea)   6. ED (erectile dysfunction) of organic origin   7. NASH (nonalcoholic steatohepatitis)  Losing weight   8. Coronary artery disease of native artery of native heart with stable  angina pectoris ([place])   9. Motion sickness, initial encounter  - scopolamine (TRANSDERM-SCOP) 1 MG/3DAYS; Place 1 patch (1.5 mg total) onto the skin every 3 (three) days.  Dispense: 2 patch; Refill: 0

## 2018-11-26 NOTE — Patient Instructions (Addendum)
Fasting needs to be 100-120  No lower than 80  Get Acai bowel in place of ice cream

## 2018-12-02 ENCOUNTER — Other Ambulatory Visit: Payer: Self-pay

## 2018-12-02 DIAGNOSIS — G43009 Migraine without aura, not intractable, without status migrainosus: Secondary | ICD-10-CM

## 2018-12-02 MED ORDER — NORTRIPTYLINE HCL 25 MG PO CAPS: 25.0000 mg | ORAL_CAPSULE | Freq: Every day | ORAL | 0 refills | Status: DC

## 2018-12-02 NOTE — Telephone Encounter (Signed)
Refill request for general medication. Nortriptyline to [facility]   Last office visit 11/26/2018   Follow up on 02/25/2019

## 2018-12-08 ENCOUNTER — Other Ambulatory Visit: Payer: Self-pay | Admitting: Family Medicine

## 2018-12-08 DIAGNOSIS — G43009 Migraine without aura, not intractable, without status migrainosus: Secondary | ICD-10-CM

## 2018-12-08 NOTE — Telephone Encounter (Signed)
Requested medications are due for refill today?  Yes  Requested medications are on the active medication list?  Yes  Last refill - 11/09/2018 #30, 0 refills  Future visit scheduled? Yes- 02/25/2019  Notes to clinic   Requested Prescriptions  Pending Prescriptions Disp Refills   baclofen (LIORESAL) 10 MG tablet [Pharmacy Med Name: BACLOFEN 10 MG TABLET] 30 tablet 0    Sig: TAKE 1 TABLET (10 MG TOTAL) BY MOUTH 4 (FOUR) TIMES DAILY. FOR 2 DAYS AND AS NEEDED AFTER THAT     Not Delegated - Analgesics:  Muscle Relaxants Failed - 12/08/2018  4:10 PM      Failed - This refill cannot be delegated      Passed - Valid encounter within last 6 months    Recent Outpatient Visits          1 week ago Type 2 diabetes mellitus with diabetic nephropathy, without long-term current use of insulin [facility])   [facility] [place], Drue Stager, MD   4 weeks ago Migraine without aura and without status migrainosus, not intractable   [facility] [place], Drue Stager, MD   3 months ago Type 2 diabetes mellitus with diabetic nephropathy, without long-term current use of insulin [facility])   [facility] [place], Drue Stager, MD   6 months ago Type 2 diabetes mellitus with diabetic nephropathy, without long-term current use of insulin [facility])   [facility] [place], Drue Stager, MD   9 months ago Type 2 diabetes mellitus with diabetic nephropathy, without long-term current use of insulin [facility])   [facility] Steele Sizer, MD      Future Appointments            In 2 months Ancil Boozer, Drue Stager, MD [facility]

## 2018-12-26 ENCOUNTER — Ambulatory Visit
Admission: EM | Admit: 2018-12-26 | Discharge: 2018-12-26 | Disposition: A | Discharge status: Home / Self Care | Payer: 59 | Attending: Family Medicine | Admitting: Family Medicine

## 2018-12-26 ENCOUNTER — Ambulatory Visit (INDEPENDENT_AMBULATORY_CARE_PROVIDER_SITE_OTHER): Payer: 59

## 2018-12-26 ENCOUNTER — Other Ambulatory Visit: Payer: Self-pay

## 2018-12-26 ENCOUNTER — Encounter: Payer: Self-pay | Admitting: Gynecology

## 2018-12-26 DIAGNOSIS — R05 Cough: Secondary | ICD-10-CM

## 2018-12-26 DIAGNOSIS — R110 Nausea: Secondary | ICD-10-CM | POA: Diagnosis not present

## 2018-12-26 DIAGNOSIS — R69 Illness, unspecified: Secondary | ICD-10-CM

## 2018-12-26 DIAGNOSIS — Z7189 Other specified counseling: Secondary | ICD-10-CM

## 2018-12-26 DIAGNOSIS — J111 Influenza due to unidentified influenza virus with other respiratory manifestations: Secondary | ICD-10-CM

## 2018-12-26 DIAGNOSIS — R509 Fever, unspecified: Secondary | ICD-10-CM | POA: Diagnosis not present

## 2018-12-26 LAB — RAPID INFLUENZA A&B ANTIGENS
Influenza A (ARMC): NEGATIVE
Influenza B (ARMC): NEGATIVE

## 2018-12-26 MED ORDER — ONDANSETRON 4 MG PO TBDP: 4.0000 mg | ORAL_TABLET | Freq: Three times a day (TID) | ORAL | 0 refills | Status: DC | PRN

## 2018-12-26 MED ORDER — ONDANSETRON 4 MG PO TBDP
4.0000 mg | ORAL_TABLET | Freq: Once | ORAL | Status: AC
  Administered 2018-12-26: 4 mg via ORAL

## 2018-12-26 MED ORDER — OSELTAMIVIR PHOSPHATE 75 MG PO CAPS: 75.0000 mg | ORAL_CAPSULE | Freq: Two times a day (BID) | ORAL | 0 refills | Status: DC

## 2018-12-26 NOTE — ED Triage Notes (Signed)
Patient c/o x last night with chills/ nausea and cough.

## 2018-12-26 NOTE — Discharge Instructions (Addendum)
Rest. Fluids.  Medication as prescribed.  Stay home.   Awaiting COVID results.  Take care  Dr. Lacinda Axon

## 2018-12-26 NOTE — ED Provider Notes (Signed)
[facility]    CSN: [id number] Arrival date & time: 12/26/18  1101   History   Chief Complaint Chief Complaint  Patient presents with  . Cough   HPI  58 year old male presents with fever, chills, nausea, cough.  Patient reports that he developed symptoms abruptly yesterday.  Worsened throughout the night.  Patient reports fever, chills, back pain.  Patient also reports nausea as well as cough.  Patient states that he feels very poorly.  Rates his pain as 4/10 in severity.  No reported sick contacts.  No known exacerbating or relieving factors.  No other reported symptoms.  No other complaints.  PMH, Surgical Hx, Family Hx, Social History reviewed and updated as below.  Past Medical History:  Diagnosis Date  . Allergy   . Anxiety   . COPD (chronic obstructive pulmonary disease) ([place])   . Diabetes mellitus without complication ([place])   . Hyperlipidemia   . Hypertension   . Male impotence   . MI (myocardial infarction) ([place])   . Obesity   . PNA (pneumonia)     Patient Active Problem List   Diagnosis Date Noted  . Coronary artery disease of native artery of native heart with stable angina pectoris ([place]) 11/26/2018  . OSA (obstructive sleep apnea) 05/21/2015  . Perennial allergic rhinitis 11/07/2014  . Arteriosclerosis of coronary artery 11/07/2014  . CD (contact dermatitis) 11/07/2014  . Decreased libido 11/07/2014  . Diabetes mellitus with renal manifestation ([place]) 11/07/2014  . Gastro-esophageal reflux disease without esophagitis 11/07/2014  . H/O acute myocardial infarction 11/07/2014  . Hypercholesteremia 11/07/2014  . Benign hypertension 11/07/2014  . Hypertriglyceridemia 11/07/2014  . H/O high risk medication treatment 11/07/2014  . NASH (nonalcoholic steatohepatitis) 11/07/2014  . Microalbuminuria 11/07/2014  . Adult BMI 30+ 11/07/2014  . Fungal infection of toenail 11/07/2014  . Tobacco abuse 11/07/2014  . ED (erectile dysfunction) of organic  origin 09/29/2006   Past Surgical History:  Procedure Laterality Date  . cadiac stenting    . CYSTECTOMY     58 years of age  . KNEE ARTHROSCOPY      Home Medications    Prior to Admission medications   Medication Sig Start Date End Date Taking? Authorizing Provider  atorvastatin (LIPITOR) 80 MG tablet Take 1 tablet (80 mg total) by mouth daily at 6 PM. 08/26/18  Yes Sowles, Drue Stager, MD  baclofen (LIORESAL) 10 MG tablet TAKE 1 TABLET (10 MG TOTAL) BY MOUTH 4 (FOUR) TIMES DAILY. FOR 2 DAYS AND AS NEEDED AFTER THAT 12/09/18  Yes Sowles, Drue Stager, MD  clonazePAM (KLONOPIN) 0.5 MG tablet TAKE 1 TABLET BY MOUTH TWICE A DAY AS NEEDED FOR ANXIETY BEFORE FLIGHTS 07/28/18  Yes Sowles, Drue Stager, MD  clopidogrel (PLAVIX) 75 MG tablet Take 1 tablet (75 mg total) by mouth daily. 08/26/18  Yes Sowles, Drue Stager, MD  Continuous Blood Gluc Sensor (FREESTYLE LIBRE 14 DAY SENSOR) MISC 1 each by Does not apply route every 14 (fourteen) days. 08/26/18  Yes Sowles, Drue Stager, MD  Dapagliflozin-metFORMIN HCl ER (XIGDUO XR) 08-998 MG TB24 Take 1 tablet by mouth 2 (two) times daily. 08/26/18  Yes Sowles, Drue Stager, MD  ezetimibe (ZETIA) 10 MG tablet Take 1 tablet (10 mg total) by mouth daily. 08/26/18  Yes Sowles, Drue Stager, MD  famotidine (PEPCID) 20 MG tablet Take 1 tablet (20 mg total) by mouth 2 (two) times daily. 05/28/18  Yes Sowles, Drue Stager, MD  fluticasone (FLONASE) 50 MCG/ACT nasal spray Place 2 sprays into both nostrils as needed. 05/28/18  Yes  Steele Sizer, MD  Icosapent Ethyl (VASCEPA) 1 g CAPS TAKE 2 CAPSULES BY MOUTH 2 (TWO) TIMES DAILY. 08/26/18  Yes Sowles, Drue Stager, MD  insulin aspart (NOVOLOG FLEXPEN) 100 UNIT/ML FlexPen Inject 4-10 Units into the skin 3 (three) times daily with meals. Pre-meal insulin :  Check fsbs prior to meals  Hold if below 110 If glucose 110- 140 give 4 units If glucose 140 to 180 give 6 units  If glucose 180 to 220  give 8 units If glucose above 220 give 10 units 06/03/18  Yes Sowles,  Drue Stager, MD  Insulin Pen Needle (BD PEN NEEDLE NANO U/F) 32G X 4 MM MISC USE AS DIRECTED. E11.29 06/26/18  Yes Sowles, Drue Stager, MD  lisinopril (ZESTRIL) 2.5 MG tablet Take 1 tablet (2.5 mg total) by mouth daily. 11/26/18  Yes Sowles, Drue Stager, MD  loratadine (CLARITIN) 10 MG tablet Take 1 tablet by mouth daily. 08/09/12  Yes [provider]  meloxicam (MOBIC) 15 MG tablet TAKE 1 TABLET BY MOUTH EVERY DAY 08/27/18  Yes Sowles, Drue Stager, MD  metoprolol succinate (TOPROL-XL) 25 MG 24 hr tablet Take 1 tablet (25 mg total) by mouth daily. 08/26/18  Yes Sowles, Drue Stager, MD  nortriptyline (PAMELOR) 25 MG capsule Take 1 capsule (25 mg total) by mouth at bedtime. 12/02/18  Yes Sowles, Drue Stager, MD  ONE TOUCH ULTRA TEST test strip USE AS DIRECTED 05/06/18  Yes Sowles, Drue Stager, MD  scopolamine (TRANSDERM-SCOP) 1 MG/3DAYS Place 1 patch (1.5 mg total) onto the skin every 3 (three) days. 11/26/18  Yes Sowles, Drue Stager, MD  sildenafil (VIAGRA) 100 MG tablet Take 0.5-1 tablets (50-100 mg total) by mouth daily as needed for erectile dysfunction. 08/26/18  Yes Sowles, Drue Stager, MD  traZODone (DESYREL) 50 MG tablet TAKE 1/2-2 TABLETS (25-100 MG TOTAL) BY MOUTH AT BEDTIME AS NEEDED FOR SLEEP. 09/21/18  Yes Sowles, Drue Stager, MD  triamcinolone cream (KENALOG) 0.1 % Apply topically 2 (two) times daily. Apply to arms 03/22/18  Yes Sowles, Drue Stager, MD  XULTOPHY 100-3.6 UNIT-MG/ML SOPN INJECT 50 UNITS INTO THE SKIN DAILY. 11/07/18  Yes Sowles, Drue Stager, MD  ondansetron (ZOFRAN-ODT) 4 MG disintegrating tablet Take 1 tablet (4 mg total) by mouth every 8 (eight) hours as needed for nausea or vomiting. 12/26/18   Coral Spikes, DO  oseltamivir (TAMIFLU) 75 MG capsule Take 1 capsule (75 mg total) by mouth every 12 (twelve) hours. 12/26/18   Coral Spikes, DO    Family History Family History  Problem Relation Age of Onset  . Diabetes Mother   . CAD Mother   . Heart disease Mother   . Heart attack Mother   . Heart disease Father   .  Heart attack Father     Social History Social History   Tobacco Use  . Smoking status: Former Smoker    Packs/day: 1.50    Years: 30.00    Pack years: 45.00    Types: Cigarettes    Quit date: 04/19/2013    Years since quitting: 5.6  . Smokeless tobacco: Current User  Substance Use Topics  . Alcohol use: No    Alcohol/week: 0.0 standard drinks    Frequency: Never    Comment: occ  . Drug use: No     Allergies   Patient has no known allergies.   Review of Systems Review of Systems  Constitutional: Positive for chills and fever.  Respiratory: Positive for cough.   Gastrointestinal: Positive for nausea.  Musculoskeletal: Positive for back pain.   Physical Exam Triage Vital  Signs ED Triage Vitals  Enc Vitals Group     BP 12/26/18 1114 130/77     Pulse Rate 12/26/18 1114 (!) 123     Resp 12/26/18 1114 16     Temp 12/26/18 1114 (!) 101 F (38.3 C)     Temp Source 12/26/18 1114 Oral     SpO2 12/26/18 1114 95 %     Weight 12/26/18 1120 225 lb (102.1 kg)     Height 12/26/18 1120 5' 8"  (1.727 m)     Head Circumference --      Peak Flow --      Pain Score 12/26/18 1119 4     Pain Loc --      Pain Edu? --      Excl. in [place]? --    Updated Vital Signs BP 130/77 (BP Location: Left Arm)   Pulse (!) 123   Temp (!) 101 F (38.3 C) (Oral)   Resp 16   Ht 5' 8"  (1.727 m)   Wt 102.1 kg   SpO2 95%   BMI 34.21 kg/m   Visual Acuity Right Eye Distance:   Left Eye Distance:   Bilateral Distance:    Right Eye Near:   Left Eye Near:    Bilateral Near:     Physical Exam Constitutional:      General: He is not in acute distress.    Comments: Appears fatigued.  HENT:     Head: Normocephalic and atraumatic.  Eyes:     General:        Right eye: No discharge.        Left eye: No discharge.     Conjunctiva/sclera: Conjunctivae normal.  Neck:     Musculoskeletal: Neck supple. No muscular tenderness.  Cardiovascular:     Rate and Rhythm: Regular rhythm. Tachycardia  present.     Heart sounds: No murmur.  Pulmonary:     Effort: Pulmonary effort is normal.     Breath sounds: No wheezing, rhonchi or rales.  Lymphadenopathy:     Cervical: No cervical adenopathy.  Skin:    General: Skin is warm.     Findings: No rash.  Neurological:     Mental Status: He is alert.  Psychiatric:        Mood and Affect: Mood normal.        Behavior: Behavior normal.    UC Treatments / Results  Labs (all labs ordered are listed, but only abnormal results are displayed) Labs Reviewed  RAPID INFLUENZA A&B ANTIGENS ([facility] ONLY)  NOVEL CORONAVIRUS, NAA (HOSP ORDER, SEND-OUT TO REF LAB; TAT 18-24 HRS)    EKG   Radiology Dg Chest 2 View  Result Date: 12/26/2018 CLINICAL DATA:  Cough, fever EXAM: CHEST - 2 VIEW COMPARISON:  Chest x-ray dated 07/23/2016. FINDINGS: The heart size and mediastinal contours are within normal limits. Both lungs are clear. The visualized skeletal structures are unremarkable. IMPRESSION: No active cardiopulmonary disease.  No evidence of pneumonia. Electronically Signed   By: Franki Cabot M.D.   On: 12/26/2018 12:00    Procedures Procedures (including critical care time)  Medications Ordered in UC Medications  ondansetron (ZOFRAN-ODT) disintegrating tablet 4 mg (4 mg Oral Given 12/26/18 1212)    Initial Impression / Assessment and Plan / UC Course  I have reviewed the triage vital signs and the nursing notes.  Pertinent labs & imaging results that were available during my care of the patient were reviewed by me and considered in my medical  decision making (see chart for details).    58 year old male presents with influenza-like illness.  Rapid flu negative.  Possible COVID-19. Chest xray negative. Awaiting result.  Treating empirically for influenza given limitations of rapid test.  Zofran for nausea.  Work note given.  Supportive care.  Final Clinical Impressions(s) / UC Diagnoses   Final diagnoses:  Influenza-like illness  Advice  Given About Covid-19 Virus Infection     Discharge Instructions     Rest. Fluids.  Medication as prescribed.  Stay home.   Awaiting COVID results.  Take care  Dr. Lacinda Axon     ED Prescriptions    Medication Sig Dispense Auth. Provider   oseltamivir (TAMIFLU) 75 MG capsule Take 1 capsule (75 mg total) by mouth every 12 (twelve) hours. 10 capsule Kalayla Shadden G, DO   ondansetron (ZOFRAN-ODT) 4 MG disintegrating tablet Take 1 tablet (4 mg total) by mouth every 8 (eight) hours as needed for nausea or vomiting. 20 tablet Coral Spikes, DO     PDMP not reviewed this encounter.   Coral Spikes, [place] 12/26/18 1253

## 2018-12-28 ENCOUNTER — Telehealth: Payer: Self-pay | Admitting: Emergency Medicine

## 2018-12-28 LAB — NOVEL CORONAVIRUS, NAA (HOSP ORDER, SEND-OUT TO REF LAB; TAT 18-24 HRS): SARS-CoV-2, NAA: DETECTED — AB

## 2018-12-28 NOTE — Telephone Encounter (Signed)
Patient called requesting COVID results. Notified patient that he was positive. Advised patient to continue to self-quarantine at home. Informed patient that another staff member would be contacting him today with additional information. Patient verbalized understanding.

## 2018-12-29 ENCOUNTER — Telehealth (HOSPITAL_COMMUNITY): Payer: Self-pay | Admitting: Emergency Medicine

## 2018-12-29 NOTE — Telephone Encounter (Signed)
Covid Positive, contacted patient to make sure he was notified, all questions answered.

## 2019-01-01 ENCOUNTER — Other Ambulatory Visit: Payer: Self-pay | Admitting: Family Medicine

## 2019-01-01 DIAGNOSIS — E1121 Type 2 diabetes mellitus with diabetic nephropathy: Secondary | ICD-10-CM

## 2019-01-01 DIAGNOSIS — E1169 Type 2 diabetes mellitus with other specified complication: Secondary | ICD-10-CM

## 2019-01-04 ENCOUNTER — Other Ambulatory Visit: Payer: Self-pay | Admitting: Family Medicine

## 2019-01-04 DIAGNOSIS — G43009 Migraine without aura, not intractable, without status migrainosus: Secondary | ICD-10-CM

## 2019-01-04 NOTE — Telephone Encounter (Signed)
Requested medication (s) are due for refill today: yes  Requested medication (s) are on the active medication list: yes  Last refill:  12/10/2018  Future visit scheduled: yes  Notes to clinic:  Refill cannot be delegated    Requested Prescriptions  Pending Prescriptions Disp Refills   baclofen (LIORESAL) 10 MG tablet [Pharmacy Med Name: BACLOFEN 10 MG TABLET] 30 tablet 0    Sig: TAKE 1 TABLET (10 MG TOTAL) BY MOUTH 4 (FOUR) TIMES DAILY. FOR 2 DAYS AND AS NEEDED AFTER THAT     Not Delegated - Analgesics:  Muscle Relaxants Failed - 01/04/2019  1:27 AM      Failed - This refill cannot be delegated      Passed - Valid encounter within last 6 months    Recent Outpatient Visits          1 month ago Type 2 diabetes mellitus with diabetic nephropathy, without long-term current use of insulin [facility])   [facility] [place] [facility] [place], Drue Stager, MD   1 month ago Migraine without aura and without status migrainosus, not intractable   [facility], Drue Stager, MD   4 months ago Type 2 diabetes mellitus with diabetic nephropathy, without long-term current use of insulin [facility])   [facility], Drue Stager, MD   7 months ago Type 2 diabetes mellitus with diabetic nephropathy, without long-term current use of insulin [facility])   [facility], Drue Stager, MD   10 months ago Type 2 diabetes mellitus with diabetic nephropathy, without long-term current use of insulin [facility])   [facility] Steele Sizer, MD      Future Appointments            In 1 month Ancil Boozer, Drue Stager, MD [facility]

## 2019-01-11 ENCOUNTER — Other Ambulatory Visit: Payer: Self-pay | Admitting: Family Medicine

## 2019-01-11 DIAGNOSIS — G43009 Migraine without aura, not intractable, without status migrainosus: Secondary | ICD-10-CM

## 2019-02-08 ENCOUNTER — Other Ambulatory Visit: Payer: Self-pay | Admitting: Family Medicine

## 2019-02-08 DIAGNOSIS — E1169 Type 2 diabetes mellitus with other specified complication: Secondary | ICD-10-CM

## 2019-02-08 DIAGNOSIS — E1121 Type 2 diabetes mellitus with diabetic nephropathy: Secondary | ICD-10-CM

## 2019-02-15 ENCOUNTER — Other Ambulatory Visit: Payer: Self-pay | Admitting: Family Medicine

## 2019-02-15 DIAGNOSIS — G43009 Migraine without aura, not intractable, without status migrainosus: Secondary | ICD-10-CM

## 2019-02-25 ENCOUNTER — Other Ambulatory Visit: Payer: Self-pay

## 2019-02-25 ENCOUNTER — Ambulatory Visit: Payer: 59 | Admitting: Family Medicine

## 2019-02-25 ENCOUNTER — Encounter: Payer: Self-pay | Admitting: Family Medicine

## 2019-02-25 VITALS — BP 116/70 | HR 89 | Temp 97.1°F | Resp 16 | Ht 68.0 in | Wt 215.4 lb

## 2019-02-25 DIAGNOSIS — E1169 Type 2 diabetes mellitus with other specified complication: Secondary | ICD-10-CM

## 2019-02-25 DIAGNOSIS — E785 Hyperlipidemia, unspecified: Secondary | ICD-10-CM

## 2019-02-25 DIAGNOSIS — E781 Pure hyperglyceridemia: Secondary | ICD-10-CM

## 2019-02-25 DIAGNOSIS — E7800 Pure hypercholesterolemia, unspecified: Secondary | ICD-10-CM

## 2019-02-25 DIAGNOSIS — E1121 Type 2 diabetes mellitus with diabetic nephropathy: Secondary | ICD-10-CM

## 2019-02-25 DIAGNOSIS — Z23 Encounter for immunization: Secondary | ICD-10-CM

## 2019-02-25 DIAGNOSIS — I2510 Atherosclerotic heart disease of native coronary artery without angina pectoris: Secondary | ICD-10-CM

## 2019-02-25 DIAGNOSIS — G43009 Migraine without aura, not intractable, without status migrainosus: Secondary | ICD-10-CM | POA: Diagnosis not present

## 2019-02-25 DIAGNOSIS — I10 Essential (primary) hypertension: Secondary | ICD-10-CM

## 2019-02-25 DIAGNOSIS — N529 Male erectile dysfunction, unspecified: Secondary | ICD-10-CM | POA: Diagnosis not present

## 2019-02-25 DIAGNOSIS — K219 Gastro-esophageal reflux disease without esophagitis: Secondary | ICD-10-CM

## 2019-02-25 DIAGNOSIS — J3089 Other allergic rhinitis: Secondary | ICD-10-CM

## 2019-02-25 LAB — POCT GLYCOSYLATED HEMOGLOBIN (HGB A1C): Hemoglobin A1C: 6.5 % — AB (ref 4.0–5.6)

## 2019-02-25 MED ORDER — LISINOPRIL 2.5 MG PO TABS: 2.5000 mg | ORAL_TABLET | Freq: Every day | ORAL | 5 refills | Status: DC

## 2019-02-25 MED ORDER — SILDENAFIL CITRATE 100 MG PO TABS: 50.0000 mg | ORAL_TABLET | Freq: Every day | ORAL | 0 refills | Status: DC | PRN

## 2019-02-25 MED ORDER — METOPROLOL SUCCINATE ER 25 MG PO TB24: 12.5000 mg | ORAL_TABLET | Freq: Every day | ORAL | 5 refills | Status: DC

## 2019-02-25 MED ORDER — NORTRIPTYLINE HCL 25 MG PO CAPS: 25.0000 mg | ORAL_CAPSULE | Freq: Every day | ORAL | 5 refills | Status: DC

## 2019-02-25 MED ORDER — CLOPIDOGREL BISULFATE 75 MG PO TABS: 75.0000 mg | ORAL_TABLET | Freq: Every day | ORAL | 5 refills | Status: DC

## 2019-02-25 MED ORDER — XIGDUO XR 5-1000 MG PO TB24: 1.0000 | ORAL_TABLET | Freq: Two times a day (BID) | ORAL | 5 refills | Status: DC

## 2019-02-25 MED ORDER — EZETIMIBE 10 MG PO TABS: 10.0000 mg | ORAL_TABLET | Freq: Every day | ORAL | 5 refills | Status: DC

## 2019-02-25 MED ORDER — VASCEPA 1 G PO CAPS: ORAL_CAPSULE | ORAL | 5 refills | Status: DC

## 2019-02-25 MED ORDER — ATORVASTATIN CALCIUM 80 MG PO TABS: 80.0000 mg | ORAL_TABLET | Freq: Every day | ORAL | 5 refills | Status: DC

## 2019-02-25 MED ORDER — FAMOTIDINE 20 MG PO TABS: 20.0000 mg | ORAL_TABLET | Freq: Two times a day (BID) | ORAL | 5 refills | Status: DC

## 2019-02-25 MED ORDER — FLUTICASONE PROPIONATE 50 MCG/ACT NA SUSP: 2.0000 | NASAL | 1 refills | Status: DC | PRN

## 2019-02-25 MED ORDER — BACLOFEN 10 MG PO TABS: 10.0000 mg | ORAL_TABLET | Freq: Two times a day (BID) | ORAL | 5 refills | Status: DC

## 2019-02-25 NOTE — Progress Notes (Signed)
Name: Jesse Lawrence.   MRN: [id number]    DOB: [birth year]   Date:02/25/2019       Progress Note  Subjective  Chief Complaint  Chief Complaint  Patient presents with  . Diabetes  . Hypertension  . Insomnia  . Migraine    HPI  DM II : His hgbA1C was trending up, 7.1%,7.9%,  8% but he started to follow a  Carbohydrate restrictive diet  and he is doing very well with dietary modification, last A1C was down to 5.7% but since last visit he had COVID-19 and ran out of medication because he was quarantine and he did not follow a healthy diet for that period of time. A1C today is 6.5% today.  He is off Novolog since August , he is on Xigduo 5/100 two daily and also Xultophy and before COVID-19 he was able to titrate down to 34 units but is back on 50 units since than. He states after Thanksgiving he will try titrating  down again. His glucose was down to 97 this am.   History of COVID-19: diagnosed 12/2018, he is back to his normal self.   Insomnia:he is doing well now, taking Elavil at night for headaches and has helped his sleep   HTN/CAD: no chest pain, no palpitation, bp is towards low end of normal, he states he has some dizziness when he stoops down, losing weight, changed diet, we decreased lisinopril to 2.5 mg Summer 2020, today we will go down on Metoprolol to half pill daily to 12.5 mg daily  He is on Plavix, statin and Zetia., Vascepa., beta-blocker also  Still sees Dr. Tyson Dense well at this time  ED: he has difficulty maintaining and erection,he used to take Cialis, now on viagra, and doing well. Unchanged   OSA: he has not been compliant with CPAP machine, he cannot tolerated. I explained importance of compliance with therapy.He has been waking up during the night.He still refuses having another study. Unchanged   Migraine headache: he states he has a history of migraines, used to go to [facility] when severe and has seen a headache specialist about 20 years ago. He  states since taking Elavil at night and one baclofen in am, he is doing better, he still has neck pain but no longer having headaches.   Patient Active Problem List   Diagnosis Date Noted  . Coronary artery disease of native artery of native heart with stable angina pectoris ([place]) 11/26/2018  . OSA (obstructive sleep apnea) 05/21/2015  . Perennial allergic rhinitis 11/07/2014  . Arteriosclerosis of coronary artery 11/07/2014  . CD (contact dermatitis) 11/07/2014  . Decreased libido 11/07/2014  . Diabetes mellitus with renal manifestation ([place]) 11/07/2014  . Gastro-esophageal reflux disease without esophagitis 11/07/2014  . H/O acute myocardial infarction 11/07/2014  . Hypercholesteremia 11/07/2014  . Benign hypertension 11/07/2014  . Hypertriglyceridemia 11/07/2014  . H/O high risk medication treatment 11/07/2014  . NASH (nonalcoholic steatohepatitis) 11/07/2014  . Microalbuminuria 11/07/2014  . Adult BMI 30+ 11/07/2014  . Fungal infection of toenail 11/07/2014  . Tobacco abuse 11/07/2014  . ED (erectile dysfunction) of organic origin 09/29/2006    Past Surgical History:  Procedure Laterality Date  . cadiac stenting    . CYSTECTOMY     58 years of age  . KNEE ARTHROSCOPY      Family History  Problem Relation Age of Onset  . Diabetes Mother   . CAD Mother   . Heart disease Mother   . Heart attack  Mother   . Heart disease Father   . Heart attack Father     Social History   Socioeconomic History  . Marital status: Married    Spouse name: Joseph Art  . Number of children: 4  . Years of education: Not on file  . Highest education level: Not on file  Occupational History  . Not on file  Social Needs  . Financial resource strain: Not hard at all  . Food insecurity    Worry: Never true    Inability: Never true  . Transportation needs    Medical: No    Non-medical: No  Tobacco Use  . Smoking status: Former Smoker    Packs/day: 1.50    Years: 30.00    Pack years:  45.00    Types: Cigarettes    Quit date: 04/19/2013    Years since quitting: 5.8  . Smokeless tobacco: Current User  Substance and Sexual Activity  . Alcohol use: No    Alcohol/week: 0.0 standard drinks    Frequency: Never    Comment: occ  . Drug use: No  . Sexual activity: Yes    Partners: Female  Lifestyle  . Physical activity    Days per week: 0 days    Minutes per session: 0 min  . Stress: Not at all  Relationships  . Social connections    Talks on phone: More than three times a week    Gets together: More than three times a week    Attends religious service: More than 4 times per year    Active member of club or organization: Yes    Attends meetings of clubs or organizations: More than 4 times per year    Relationship status: Married  . Intimate partner violence    Fear of current or ex partner: No    Emotionally abused: No    Physically abused: No    Forced sexual activity: No  Other Topics Concern  . Not on file  Social History Narrative  . Not on file     Current Outpatient Medications:  .  atorvastatin (LIPITOR) 80 MG tablet, Take 1 tablet (80 mg total) by mouth daily at 6 PM., Disp: 30 tablet, Rfl: 5 .  baclofen (LIORESAL) 10 MG tablet, Take 1 tablet (10 mg total) by mouth 2 (two) times daily., Disp: 60 tablet, Rfl: 5 .  clopidogrel (PLAVIX) 75 MG tablet, Take 1 tablet (75 mg total) by mouth daily., Disp: 30 tablet, Rfl: 5 .  Continuous Blood Gluc Sensor (FREESTYLE LIBRE 14 DAY SENSOR) MISC, 1 each by Does not apply route every 14 (fourteen) days., Disp: 2 each, Rfl: 1 .  Dapagliflozin-metFORMIN HCl ER (XIGDUO XR) 08-998 MG TB24, Take 1 tablet by mouth 2 (two) times daily., Disp: 60 tablet, Rfl: 5 .  ezetimibe (ZETIA) 10 MG tablet, Take 1 tablet (10 mg total) by mouth daily., Disp: 30 tablet, Rfl: 5 .  famotidine (PEPCID) 20 MG tablet, Take 1 tablet (20 mg total) by mouth 2 (two) times daily., Disp: 60 tablet, Rfl: 5 .  fluticasone (FLONASE) 50 MCG/ACT nasal  spray, Place 2 sprays into both nostrils as needed., Disp: 48 g, Rfl: 1 .  icosapent Ethyl (VASCEPA) 1 g capsule, TAKE 2 CAPSULES BY MOUTH 2 (TWO) TIMES DAILY., Disp: 120 capsule, Rfl: 5 .  Insulin Pen Needle (BD PEN NEEDLE NANO U/F) 32G X 4 MM MISC, USE AS DIRECTED. E11.29, Disp: 100 each, Rfl: 12 .  lisinopril (ZESTRIL) 2.5 MG tablet, Take  1 tablet (2.5 mg total) by mouth daily., Disp: 30 tablet, Rfl: 5 .  loratadine (CLARITIN) 10 MG tablet, Take 1 tablet by mouth daily., Disp: , Rfl:  .  meloxicam (MOBIC) 15 MG tablet, TAKE 1 TABLET BY MOUTH EVERY DAY, Disp: 30 tablet, Rfl: 0 .  metoprolol succinate (TOPROL-XL) 25 MG 24 hr tablet, Take 0.5 tablets (12.5 mg total) by mouth daily., Disp: 15 tablet, Rfl: 5 .  nortriptyline (PAMELOR) 25 MG capsule, Take 1 capsule (25 mg total) by mouth at bedtime., Disp: 30 capsule, Rfl: 5 .  ONE TOUCH ULTRA TEST test strip, USE AS DIRECTED, Disp: 100 each, Rfl: 11 .  sildenafil (VIAGRA) 100 MG tablet, Take 0.5-1 tablets (50-100 mg total) by mouth daily as needed for erectile dysfunction., Disp: 30 tablet, Rfl: 0 .  XULTOPHY 100-3.6 UNIT-MG/ML SOPN, INJECT 50 UNITS INTO THE SKIN DAILY., Disp: 15 mL, Rfl: 0  No Known Allergies  I personally reviewed active problem list, medication list, allergies, family history, social history, health maintenance with the patient/caregiver today.   ROS  Constitutional: Negative for fever or weight change.  Respiratory: Negative for cough and shortness of breath.   Cardiovascular: Negative for chest pain or palpitations.  Gastrointestinal: Negative for abdominal pain, no bowel changes.  Musculoskeletal: Negative for gait problem or joint swelling.  Skin: Negative for rash.  Neurological: Negative for dizziness or headache.  No other specific complaints in a complete review of systems (except as listed in HPI above).  Objective  Vitals:   02/25/19 1024  BP: 116/70  Pulse: 89  Resp: 16  Temp: (!) 97.1 F (36.2 C)   TempSrc: Temporal  SpO2: 96%  Weight: 215 lb 6.4 oz (97.7 kg)  Height: 5' 8"  (1.727 m)    Body mass index is 32.75 kg/m.  Physical Exam  Constitutional: Patient appears well-developed and well-nourished. Obese  No distress.  HEENT: head atraumatic, normocephalic, pupils equal and reactive to light, Cardiovascular: Normal rate, regular rhythm and normal heart sounds.  No murmur heard. No BLE edema. Pulmonary/Chest: Effort normal and breath sounds normal. No respiratory distress. Abdominal: Soft.  There is no tenderness. Psychiatric: Patient has a normal mood and affect. behavior is normal. Judgment and thought content normal.  Recent Results (from the past 2160 hour(s))  Rapid Influenza A&B Antigens ([place] only)     Status: None   Collection Time: 12/26/18 11:29 AM   Specimen: Respiratory  Result Value Ref Range   Influenza A ([facility]) NEGATIVE NEGATIVE   Influenza B ([place]) NEGATIVE NEGATIVE    Comment: Performed at [facility], [address], [place], [place] [address]  Novel Coronavirus, NAA (hospital order; send-out to ref lab)     Status: Abnormal   Collection Time: 12/26/18 11:29 AM   Specimen: Respiratory  Result Value Ref Range   SARS-CoV-2, NAA DETECTED (A) NOT DETECTED    Comment: (NOTE)                  Client Requested Flag This nucleic acid amplification test was developed and its performance characteristics determined by [employer]. Nucleic acid amplification tests include PCR and TMA. This test has not been FDA cleared or approved. This test has been authorized by FDA under an Emergency Use Authorization (EUA). This test is only authorized for the duration of time the declaration that circumstances exist justifying the authorization of the emergency use of in vitro diagnostic tests for detection of SARS-CoV-2 virus and/or diagnosis of COVID-19 infection under section  564(b)(1) of the Act, 21 U.S.C. 591MBW-4(Y) (1), unless the authorization  is terminated or revoked sooner. When diagnostic testing is negative, the possibility of a false negative result should be considered in the context of a patient's recent exposures and the presence of clinical signs and symptoms consistent with COVID-19. An individual without symptoms of COVID-  19 and who is not shedding SARS-CoV-2 virus would expect to have a negative (not detected) result in this assay. Performed At: [facility] [address] [place], [place] [id number] Rush Farmer MD XB:[id number]    Coronavirus Source NASOPHARYNGEAL     Comment: Performed at [facility], [address] [place], [place] [address]  POCT HgB A1C     Status: Abnormal   Collection Time: 02/25/19 10:30 AM  Result Value Ref Range   Hemoglobin A1C 6.5 (A) 4.0 - 5.6 %   HbA1c POC (<> result, manual entry)     HbA1c, POC (prediabetic range)     HbA1c, POC (controlled diabetic range)      Diabetic Foot Exam: Diabetic Foot Exam - Simple   Simple Foot Form Diabetic Foot exam was performed with the following findings: Yes 02/25/2019 10:52 AM  Visual Inspection See comments: Yes Sensation Testing Intact to touch and monofilament testing bilaterally: Yes Pulse Check Posterior Tibialis and Dorsalis pulse intact bilaterally: Yes Comments Thick nails      PHQ2/9: Depression screen [facility] 2/9 02/25/2019 11/09/2018 08/26/2018 05/28/2018 02/26/2018  Decreased Interest 0 0 0 0 0  Down, Depressed, Hopeless 0 0 0 0 0  PHQ - 2 Score 0 0 0 0 0  Altered sleeping 0 0 0 2 0  Tired, decreased energy 0 0 0 1 0  Change in appetite 0 0 0 2 0  Feeling bad or failure about yourself  0 0 0 0 0  Trouble concentrating 0 0 0 0 0  Moving slowly or fidgety/restless 0 0 0 0 0  Suicidal thoughts 0 0 0 0 0  PHQ-9 Score 0 0 0 5 0  Difficult doing work/chores - Not difficult at all Not difficult at all Not difficult at all Not difficult at all    phq 9 is negative   Fall Risk: Fall Risk   11/26/2018 11/09/2018 08/26/2018 02/26/2018 11/20/2017  Falls in the past year? 0 0 0 0 No  Number falls in past yr: 0 0 0 - -  Injury with Fall? 0 0 0 - -     Functional Status Survey: Is the patient deaf or have difficulty hearing?: No Does the patient have difficulty seeing, even when wearing glasses/contacts?: No Does the patient have difficulty concentrating, remembering, or making decisions?: No Does the patient have difficulty walking or climbing stairs?: No Does the patient have difficulty dressing or bathing?: No Does the patient have difficulty doing errands alone such as visiting a doctor's office or shopping?: No    Assessment & Plan   1. Type 2 diabetes mellitus with diabetic nephropathy, without long-term current use of insulin (HCC)  - POCT HgB A1C - lisinopril (ZESTRIL) 2.5 MG tablet; Take 1 tablet (2.5 mg total) by mouth daily.  Dispense: 30 tablet; Refill: 5 - Dapagliflozin-metFORMIN HCl ER (XIGDUO XR) 08-998 MG TB24; Take 1 tablet by mouth 2 (two) times daily.  Dispense: 60 tablet; Refill: 5  2. Need for immunization against influenza  - Flu Vaccine QUAD 36+ mos IM  3. ED (erectile dysfunction) of organic origin  - sildenafil (VIAGRA) 100 MG tablet; Take 0.5-1  tablets (50-100 mg total) by mouth daily as needed for erectile dysfunction.  Dispense: 30 tablet; Refill: 0  4. Migraine without aura and without status migrainosus, not intractable  - nortriptyline (PAMELOR) 25 MG capsule; Take 1 capsule (25 mg total) by mouth at bedtime.  Dispense: 30 capsule; Refill: 5 - baclofen (LIORESAL) 10 MG tablet; Take 1 tablet (10 mg total) by mouth 2 (two) times daily.  Dispense: 60 tablet; Refill: 5  5. Arteriosclerosis of coronary artery  - metoprolol succinate (TOPROL-XL) 25 MG 24 hr tablet; Take 0.5 tablets (12.5 mg total) by mouth daily.  Dispense: 15 tablet; Refill: 5 - ezetimibe (ZETIA) 10 MG tablet; Take 1 tablet (10 mg total) by mouth daily.  Dispense: 30 tablet;  Refill: 5 - clopidogrel (PLAVIX) 75 MG tablet; Take 1 tablet (75 mg total) by mouth daily.  Dispense: 30 tablet; Refill: 5  6. Benign hypertension  - metoprolol succinate (TOPROL-XL) 25 MG 24 hr tablet; Take 0.5 tablets (12.5 mg total) by mouth daily.  Dispense: 15 tablet; Refill: 5 - lisinopril (ZESTRIL) 2.5 MG tablet; Take 1 tablet (2.5 mg total) by mouth daily.  Dispense: 30 tablet; Refill: 5  7. Hypertriglyceridemia  - icosapent Ethyl (VASCEPA) 1 g capsule; TAKE 2 CAPSULES BY MOUTH 2 (TWO) TIMES DAILY.  Dispense: 120 capsule; Refill: 5  8. Dyslipidemia associated with type 2 diabetes mellitus (HCC)  - icosapent Ethyl (VASCEPA) 1 g capsule; TAKE 2 CAPSULES BY MOUTH 2 (TWO) TIMES DAILY.  Dispense: 120 capsule; Refill: 5 - Dapagliflozin-metFORMIN HCl ER (XIGDUO XR) 08-998 MG TB24; Take 1 tablet by mouth 2 (two) times daily.  Dispense: 60 tablet; Refill: 5  9. Hypercholesteremia  - ezetimibe (ZETIA) 10 MG tablet; Take 1 tablet (10 mg total) by mouth daily.  Dispense: 30 tablet; Refill: 5 - atorvastatin (LIPITOR) 80 MG tablet; Take 1 tablet (80 mg total) by mouth daily at 6 PM.  Dispense: 30 tablet; Refill: 5  10. Gastro-esophageal reflux disease without esophagitis  - famotidine (PEPCID) 20 MG tablet; Take 1 tablet (20 mg total) by mouth 2 (two) times daily.  Dispense: 60 tablet; Refill: 5  11. Perennial allergic rhinitis  - fluticasone (FLONASE) 50 MCG/ACT nasal spray; Place 2 sprays into both nostrils as needed.  Dispense: 48 g; Refill: 1

## 2019-02-28 ENCOUNTER — Other Ambulatory Visit: Payer: Self-pay | Admitting: Family Medicine

## 2019-02-28 DIAGNOSIS — E1169 Type 2 diabetes mellitus with other specified complication: Secondary | ICD-10-CM

## 2019-02-28 DIAGNOSIS — E785 Hyperlipidemia, unspecified: Secondary | ICD-10-CM

## 2019-02-28 DIAGNOSIS — E1121 Type 2 diabetes mellitus with diabetic nephropathy: Secondary | ICD-10-CM

## 2019-03-31 ENCOUNTER — Other Ambulatory Visit: Payer: Self-pay | Admitting: Family Medicine

## 2019-03-31 DIAGNOSIS — E1121 Type 2 diabetes mellitus with diabetic nephropathy: Secondary | ICD-10-CM

## 2019-03-31 DIAGNOSIS — E1169 Type 2 diabetes mellitus with other specified complication: Secondary | ICD-10-CM

## 2019-03-31 DIAGNOSIS — E785 Hyperlipidemia, unspecified: Secondary | ICD-10-CM

## 2019-04-05 ENCOUNTER — Telehealth: Payer: Self-pay

## 2019-04-05 NOTE — Telephone Encounter (Signed)
Pt will keep February appt and will call back if need. Stated he will keep taking Aleve for the time being.

## 2019-04-05 NOTE — Telephone Encounter (Signed)
Copied from [place]. Topic: General - Inquiry >> Apr 05, 2019 11:00 AM Richardo Priest, NT wrote: Reason for CRM: Pt called in stating he would like to know if a prescription can be called in for his left shoulder pain. Pt states he is taking OTC with no improvement. Pharmacy is [id number] [place], [place] Phone:  [phone]:  [phone] advise.

## 2019-04-05 NOTE — Telephone Encounter (Signed)
Please call and schedule appointment for left shoulder pain.

## 2019-04-16 ENCOUNTER — Other Ambulatory Visit: Payer: Self-pay | Admitting: Family Medicine

## 2019-04-16 DIAGNOSIS — E1121 Type 2 diabetes mellitus with diabetic nephropathy: Secondary | ICD-10-CM

## 2019-04-16 DIAGNOSIS — E1169 Type 2 diabetes mellitus with other specified complication: Secondary | ICD-10-CM

## 2019-06-03 ENCOUNTER — Other Ambulatory Visit: Payer: Self-pay

## 2019-06-03 ENCOUNTER — Encounter: Payer: Self-pay | Admitting: Family Medicine

## 2019-06-03 ENCOUNTER — Ambulatory Visit: Payer: 59 | Admitting: Family Medicine

## 2019-06-03 VITALS — BP 114/60 | HR 108 | Temp 97.8°F | Resp 16 | Ht 68.0 in | Wt 226.5 lb

## 2019-06-03 DIAGNOSIS — R351 Nocturia: Secondary | ICD-10-CM

## 2019-06-03 DIAGNOSIS — I2510 Atherosclerotic heart disease of native coronary artery without angina pectoris: Secondary | ICD-10-CM | POA: Diagnosis not present

## 2019-06-03 DIAGNOSIS — E785 Hyperlipidemia, unspecified: Secondary | ICD-10-CM

## 2019-06-03 DIAGNOSIS — E1121 Type 2 diabetes mellitus with diabetic nephropathy: Secondary | ICD-10-CM | POA: Diagnosis not present

## 2019-06-03 DIAGNOSIS — I10 Essential (primary) hypertension: Secondary | ICD-10-CM

## 2019-06-03 DIAGNOSIS — E1169 Type 2 diabetes mellitus with other specified complication: Secondary | ICD-10-CM

## 2019-06-03 DIAGNOSIS — N529 Male erectile dysfunction, unspecified: Secondary | ICD-10-CM

## 2019-06-03 DIAGNOSIS — M7542 Impingement syndrome of left shoulder: Secondary | ICD-10-CM

## 2019-06-03 DIAGNOSIS — I25118 Atherosclerotic heart disease of native coronary artery with other forms of angina pectoris: Secondary | ICD-10-CM

## 2019-06-03 DIAGNOSIS — G4733 Obstructive sleep apnea (adult) (pediatric): Secondary | ICD-10-CM

## 2019-06-03 DIAGNOSIS — K7581 Nonalcoholic steatohepatitis (NASH): Secondary | ICD-10-CM

## 2019-06-03 DIAGNOSIS — N401 Enlarged prostate with lower urinary tract symptoms: Secondary | ICD-10-CM

## 2019-06-03 LAB — POCT GLYCOSYLATED HEMOGLOBIN (HGB A1C): Hemoglobin A1C: 7.4 % — AB (ref 4.0–5.6)

## 2019-06-03 MED ORDER — XULTOPHY 100-3.6 UNIT-MG/ML ~~LOC~~ SOPN: 50.0000 [IU] | PEN_INJECTOR | Freq: Every day | SUBCUTANEOUS | 5 refills | Status: DC

## 2019-06-03 MED ORDER — SILDENAFIL CITRATE 100 MG PO TABS: 50.0000 mg | ORAL_TABLET | Freq: Every day | ORAL | 0 refills | Status: DC | PRN

## 2019-06-03 NOTE — Addendum Note (Signed)
Addended by: Inda Coke on: 06/03/2019 03:42 PM   Modules accepted: Orders

## 2019-06-03 NOTE — Progress Notes (Signed)
Name: Jesse Lawrence.   MRN: [id number]    DOB: 1960/08/29   Date:06/03/2019       Progress Note  Subjective  Chief Complaint  Chief Complaint  Patient presents with  . Diabetes  . Hypertension  . Insomnia  . Migraine  . Shoulder Pain  . Elbow Pain    HPI  DM II : His hgbA1Cwastrending up, 7.1%,7.9%,  8% but he started to follow a  Carbohydrate restrictive diet  and he was  doing very well with dietary modification and A1C went down to 5.7%, he is gradually splurging more and went up to 6.5% today it is back at 7.3%.  He is off Novolog since August , he is on Xigduo 5/100 two daily and also Xultophy 50 units daily . FSBS at home has around 130's   Insomnia:he was doing well and sleeping through the night, but left shoulder is bothering him and he has been getting up at night and snacking  HTN/CAD: no chest pain, no palpitation, no longer having dizziness, on low dose ACE and half pill metoprolol once  He is on Plavix, statin and Zetia., Vascepa., beta-blocker also  ED: he has difficulty maintaining and erection,he used to take Cialis, now on viagra and denies side effects   OSA: he has not been compliant with CPAP machine, he cannot tolerated. I explained importance of compliance with therapy.He has been waking up during the night.He still refuses having another study. Same  Migraine headache: he states he has a history of migraines, used to go to [facility] when severe and has seen a headache specialist about 20 years ago. He states since taking Elavil at night and one baclofen BID .   Patient Active Problem List   Diagnosis Date Noted  . Coronary artery disease of native artery of native heart with stable angina pectoris ([place]) 11/26/2018  . OSA (obstructive sleep apnea) 05/21/2015  . Perennial allergic rhinitis 11/07/2014  . Arteriosclerosis of coronary artery 11/07/2014  . CD (contact dermatitis) 11/07/2014  . Decreased libido 11/07/2014  . Diabetes mellitus  with renal manifestation ([place]) 11/07/2014  . Gastro-esophageal reflux disease without esophagitis 11/07/2014  . H/O acute myocardial infarction 11/07/2014  . Hypercholesteremia 11/07/2014  . Benign hypertension 11/07/2014  . Hypertriglyceridemia 11/07/2014  . H/O high risk medication treatment 11/07/2014  . NASH (nonalcoholic steatohepatitis) 11/07/2014  . Microalbuminuria 11/07/2014  . Adult BMI 30+ 11/07/2014  . Fungal infection of toenail 11/07/2014  . Tobacco abuse 11/07/2014  . ED (erectile dysfunction) of organic origin 09/29/2006    Past Surgical History:  Procedure Laterality Date  . cadiac stenting    . CYSTECTOMY     59 years of age  . KNEE ARTHROSCOPY      Family History  Problem Relation Age of Onset  . Diabetes Mother   . CAD Mother   . Heart disease Mother   . Heart attack Mother   . Heart disease Father   . Heart attack Father     Social History   Tobacco Use  . Smoking status: Former Smoker    Packs/day: 1.50    Years: 30.00    Pack years: 45.00    Types: Cigarettes    Quit date: 04/19/2013    Years since quitting: 6.1  . Smokeless tobacco: Current User  Substance Use Topics  . Alcohol use: No    Alcohol/week: 0.0 standard drinks    Comment: occ     Current Outpatient Medications:  .  atorvastatin (  LIPITOR) 80 MG tablet, Take 1 tablet (80 mg total) by mouth daily at 6 PM., Disp: 30 tablet, Rfl: 5 .  baclofen (LIORESAL) 10 MG tablet, Take 1 tablet (10 mg total) by mouth 2 (two) times daily., Disp: 60 tablet, Rfl: 5 .  clopidogrel (PLAVIX) 75 MG tablet, Take 1 tablet (75 mg total) by mouth daily., Disp: 30 tablet, Rfl: 5 .  Continuous Blood Gluc Sensor (FREESTYLE LIBRE 14 DAY SENSOR) MISC, 1 each by Does not apply route every 14 (fourteen) days., Disp: 2 each, Rfl: 1 .  Dapagliflozin-metFORMIN HCl ER (XIGDUO XR) 08-998 MG TB24, Take 1 tablet by mouth 2 (two) times daily., Disp: 60 tablet, Rfl: 5 .  ezetimibe (ZETIA) 10 MG tablet, Take 1 tablet (10  mg total) by mouth daily., Disp: 30 tablet, Rfl: 5 .  famotidine (PEPCID) 20 MG tablet, Take 1 tablet (20 mg total) by mouth 2 (two) times daily., Disp: 60 tablet, Rfl: 5 .  fluticasone (FLONASE) 50 MCG/ACT nasal spray, Place 2 sprays into both nostrils as needed., Disp: 48 g, Rfl: 1 .  icosapent Ethyl (VASCEPA) 1 g capsule, TAKE 2 CAPSULES BY MOUTH 2 (TWO) TIMES DAILY., Disp: 120 capsule, Rfl: 5 .  Insulin Pen Needle (BD PEN NEEDLE NANO U/F) 32G X 4 MM MISC, USE AS DIRECTED. E11.29, Disp: 100 each, Rfl: 12 .  lisinopril (ZESTRIL) 2.5 MG tablet, Take 1 tablet (2.5 mg total) by mouth daily., Disp: 30 tablet, Rfl: 5 .  loratadine (CLARITIN) 10 MG tablet, Take 1 tablet by mouth daily., Disp: , Rfl:  .  meloxicam (MOBIC) 15 MG tablet, TAKE 1 TABLET BY MOUTH EVERY DAY, Disp: 30 tablet, Rfl: 0 .  metoprolol succinate (TOPROL-XL) 25 MG 24 hr tablet, Take 0.5 tablets (12.5 mg total) by mouth daily., Disp: 15 tablet, Rfl: 5 .  nortriptyline (PAMELOR) 25 MG capsule, Take 1 capsule (25 mg total) by mouth at bedtime., Disp: 30 capsule, Rfl: 5 .  ONE TOUCH ULTRA TEST test strip, USE AS DIRECTED, Disp: 100 each, Rfl: 11 .  sildenafil (VIAGRA) 100 MG tablet, Take 0.5-1 tablets (50-100 mg total) by mouth daily as needed for erectile dysfunction., Disp: 30 tablet, Rfl: 0 .  XULTOPHY 100-3.6 UNIT-MG/ML SOPN, INJECT 50 UNITS INTO THE SKIN DAILY (Patient not taking: Reported on 06/03/2019), Disp: 15 pen, Rfl: 0  No Known Allergies  I personally reviewed active problem list, medication list, allergies, family history, social history, health maintenance with the patient/caregiver today.   ROS  Constitutional: Negative for fever , positive for  weight change- gained 11 lbs.  Respiratory: Negative for cough and shortness of breath.   Cardiovascular: Negative for chest pain or palpitations.  Gastrointestinal: Negative for abdominal pain, no bowel changes.  Musculoskeletal: Negative for gait problem or joint swelling.   Skin: Negative for rash.  Neurological: Negative for dizziness or headache.  No other specific complaints in a complete review of systems (except as listed in HPI above).   Objective  Vitals:   06/03/19 1504  BP: 114/60  Pulse: (!) 108  Resp: 16  Temp: 97.8 F (36.6 C)  TempSrc: Temporal  SpO2: 94%  Weight: 226 lb 8 oz (102.7 kg)  Height: 5' 8"  (1.727 m)    Body mass index is 34.44 kg/m.  Physical Exam  Constitutional: Patient appears well-developed and well-nourished. Obese No distress.  HEENT: head atraumatic, normocephalic, pupils equal and reactive to light Cardiovascular: Normal rate, regular rhythm and normal heart sounds.  No murmur heard. No BLE  edema. Pulmonary/Chest: Effort normal and breath sounds normal. No respiratory distress. Abdominal: Soft.  There is no tenderness. Psychiatric: Patient has a normal mood and affect. behavior is normal. Judgment and thought content normal.  Recent Results (from the past 2160 hour(s))  POCT HgB A1C     Status: Abnormal   Collection Time: 06/03/19  3:04 PM  Result Value Ref Range   Hemoglobin A1C 7.4 (A) 4.0 - 5.6 %   HbA1c POC (<> result, manual entry)     HbA1c, POC (prediabetic range)     HbA1c, POC (controlled diabetic range)       PHQ2/9: Depression screen [facility] 2/9 06/03/2019 02/25/2019 11/09/2018 08/26/2018 05/28/2018  Decreased Interest 0 0 0 0 0  Down, Depressed, Hopeless 0 0 0 0 0  PHQ - 2 Score 0 0 0 0 0  Altered sleeping 0 0 0 0 2  Tired, decreased energy 0 0 0 0 1  Change in appetite 0 0 0 0 2  Feeling bad or failure about yourself  0 0 0 0 0  Trouble concentrating 0 0 0 0 0  Moving slowly or fidgety/restless 0 0 0 0 0  Suicidal thoughts 0 0 0 0 0  PHQ-9 Score 0 0 0 0 5  Difficult doing work/chores - - Not difficult at all Not difficult at all Not difficult at all  Some recent data might be hidden    phq 9 is negative   Fall Risk: Fall Risk  11/26/2018 11/09/2018 08/26/2018 02/26/2018 11/20/2017  Falls  in the past year? 0 0 0 0 No  Number falls in past yr: 0 0 0 - -  Injury with Fall? 0 0 0 - -     Functional Status Survey: Is the patient deaf or have difficulty hearing?: No Does the patient have difficulty seeing, even when wearing glasses/contacts?: No Does the patient have difficulty concentrating, remembering, or making decisions?: No Does the patient have difficulty walking or climbing stairs?: No Does the patient have difficulty dressing or bathing?: No Does the patient have difficulty doing errands alone such as visiting a doctor's office or shopping?: No    Assessment & Plan  1. Type 2 diabetes mellitus with diabetic nephropathy, without long-term current use of insulin (HCC)  - POCT HgB A1C - Lipid panel - Insulin Degludec-Liraglutide (XULTOPHY) 100-3.6 UNIT-MG/ML SOPN; Inject 50 Units into the skin daily.  Dispense: 15 pen; Refill: 5  2. Dyslipidemia associated with type 2 diabetes mellitus (HCC)  - Lipid panel - Microalbumin / creatinine urine ratio - Insulin Degludec-Liraglutide (XULTOPHY) 100-3.6 UNIT-MG/ML SOPN; Inject 50 Units into the skin daily.  Dispense: 15 pen; Refill: 5  3. Benign hypertension  - COMPLETE METABOLIC PANEL WITH GFR - CBC with Differential/Platelet  4. Arteriosclerosis of coronary artery   5. OSA (obstructive sleep apnea)  Does not want to use CPAP   6. NASH (nonalcoholic steatohepatitis)  Discussed importance of weight loss again   7. Coronary artery disease of native artery of native heart with stable angina pectoris ([place])  Doing well at this time  8. BPH associated with nocturia  - PSA  9. Impingement syndrome of left shoulder  - Ambulatory referral to Orthopedic Surgery  10. ED (erectile dysfunction) of organic origin  - sildenafil (VIAGRA) 100 MG tablet; Take 0.5-1 tablets (50-100 mg total) by mouth daily as needed for erectile dysfunction.  Dispense: 30 tablet; Refill: 0

## 2019-06-10 ENCOUNTER — Other Ambulatory Visit: Payer: Self-pay | Admitting: Family Medicine

## 2019-06-10 DIAGNOSIS — M62838 Other muscle spasm: Secondary | ICD-10-CM

## 2019-06-10 NOTE — Telephone Encounter (Signed)
Requested medication (s) are due for refill today: Yes  Requested medication (s) are on the active medication list: Yes  Last refill:  9 months ago  Future visit scheduled: Yes  Notes to clinic:  Pharmacy asking for diagnosis code.    Requested Prescriptions  Pending Prescriptions Disp Refills   meloxicam (MOBIC) 15 MG tablet [Pharmacy Med Name: MELOXICAM 15 MG TABLET] 30 tablet 0    Sig: TAKE 1 TABLET BY MOUTH EVERY DAY      Analgesics:  COX2 Inhibitors Failed - 06/10/2019  1:16 PM      Failed - HGB in normal range and within 360 days    Hemoglobin  Date Value Ref Range Status  02/26/2018 16.0 13.2 - 17.1 g/dL Final          Failed - Cr in normal range and within 360 days    Creat  Date Value Ref Range Status  02/26/2018 0.78 0.70 - 1.33 mg/dL Final    Comment:    For patients >29 years of age, the reference limit for Creatinine is approximately 13% higher for people identified as African-American. .    Creatinine, Urine  Date Value Ref Range Status  08/26/2018 60 20 - 320 mg/dL Final          Passed - Patient is not pregnant      Passed - Valid encounter within last 12 months    Recent Outpatient Visits           1 week ago Type 2 diabetes mellitus with diabetic nephropathy, without long-term current use of insulin [facility])   [facility] [place], Drue Stager, MD   3 months ago Type 2 diabetes mellitus with diabetic nephropathy, without long-term current use of insulin [facility])   [facility] [place], Drue Stager, MD   6 months ago Type 2 diabetes mellitus with diabetic nephropathy, without long-term current use of insulin [facility])   [facility], Drue Stager, MD   7 months ago Migraine without aura and without status migrainosus, not intractable   [facility] [place], Drue Stager, MD   9 months ago Type 2 diabetes mellitus with diabetic nephropathy, without long-term current use of insulin [facility])   [facility] Steele Sizer, MD       Future Appointments             In 2 months Ancil Boozer, Drue Stager, MD [facility], [facility]

## 2019-06-17 NOTE — Addendum Note (Signed)
Addended by: Docia Furl on: 06/17/2019 08:27 AM   Modules accepted: Orders

## 2019-06-18 LAB — PSA: PSA: 1.1 ng/mL (ref <=4.0)

## 2019-06-18 LAB — COMPLETE METABOLIC PANEL WITH GFR
AG Ratio: 1.7 (calc) (ref 1.0–2.5)
ALT: 25 U/L (ref 9–46)
AST: 15 U/L (ref 10–35)
Albumin: 4.7 g/dL (ref 3.6–5.1)
Alkaline phosphatase (APISO): 64 U/L (ref 35–144)
BUN: 20 mg/dL (ref 7–25)
CO2: 26 mmol/L (ref 20–32)
Calcium: 10 mg/dL (ref 8.6–10.3)
Chloride: 100 mmol/L (ref 98–110)
Creat: 0.74 mg/dL (ref 0.70–1.33)
GFR, Est African American: 120 mL/min/{1.73_m2} (ref >=60)
GFR, Est Non African American: 103 mL/min/{1.73_m2} (ref >=60)
Globulin: 2.8 g/dL (calc) (ref 1.9–3.7)
Glucose, Bld: 122 mg/dL — ABNORMAL HIGH (ref 65–99)
Potassium: 4.4 mmol/L (ref 3.5–5.3)
Sodium: 136 mmol/L (ref 135–146)
Total Bilirubin: 1.5 mg/dL — ABNORMAL HIGH (ref 0.2–1.2)
Total Protein: 7.5 g/dL (ref 6.1–8.1)

## 2019-06-18 LAB — CBC WITH DIFFERENTIAL/PLATELET
Absolute Monocytes: 720 cells/uL (ref 200–950)
Basophils Absolute: 48 cells/uL (ref 0–200)
Basophils Relative: 0.6 %
Eosinophils Absolute: 80 cells/uL (ref 15–500)
Eosinophils Relative: 1 %
HCT: 50.7 % — ABNORMAL HIGH (ref 38.5–50.0)
Hemoglobin: 16.7 g/dL (ref 13.2–17.1)
Lymphs Abs: 2320 cells/uL (ref 850–3900)
MCH: 30.8 pg (ref 27.0–33.0)
MCHC: 32.9 g/dL (ref 32.0–36.0)
MCV: 93.5 fL (ref 80.0–100.0)
MPV: 10.7 fL (ref 7.5–12.5)
Monocytes Relative: 9 %
Neutro Abs: 4832 cells/uL (ref 1500–7800)
Neutrophils Relative %: 60.4 %
Platelets: 286 10*3/uL (ref 140–400)
RBC: 5.42 10*6/uL (ref 4.20–5.80)
RDW: 12.1 % (ref 11.0–15.0)
Total Lymphocyte: 29 %
WBC: 8 10*3/uL (ref 3.8–10.8)

## 2019-06-18 LAB — LIPID PANEL
Cholesterol: 119 mg/dL (ref <200)
HDL: 30 mg/dL — ABNORMAL LOW (ref >=40)
LDL Cholesterol (Calc): 57 mg/dL (calc)
Non-HDL Cholesterol (Calc): 89 mg/dL (calc) (ref <130)
Total CHOL/HDL Ratio: 4 (calc) (ref <5.0)
Triglycerides: 276 mg/dL — ABNORMAL HIGH (ref <150)

## 2019-06-18 LAB — MICROALBUMIN / CREATININE URINE RATIO
Creatinine, Urine: 56 mg/dL (ref 20–320)
Microalb Creat Ratio: 86 mcg/mg creat — ABNORMAL HIGH (ref <30)
Microalb, Ur: 4.8 mg/dL

## 2019-07-04 ENCOUNTER — Other Ambulatory Visit: Payer: Self-pay | Admitting: Family Medicine

## 2019-07-04 DIAGNOSIS — E1121 Type 2 diabetes mellitus with diabetic nephropathy: Secondary | ICD-10-CM

## 2019-07-13 ENCOUNTER — Other Ambulatory Visit: Payer: Self-pay | Admitting: Family Medicine

## 2019-07-13 DIAGNOSIS — M62838 Other muscle spasm: Secondary | ICD-10-CM

## 2019-07-13 NOTE — Telephone Encounter (Signed)
Requested Prescriptions  Pending Prescriptions Disp Refills  . meloxicam (MOBIC) 15 MG tablet [Pharmacy Med Name: MELOXICAM 15 MG TABLET] 30 tablet 0    Sig: TAKE 1 TABLET BY MOUTH EVERY DAY     Analgesics:  COX2 Inhibitors Passed - 07/13/2019  2:15 AM      Passed - HGB in normal range and within 360 days    Hemoglobin  Date Value Ref Range Status  06/17/2019 16.7 13.2 - 17.1 g/dL Final         Passed - Cr in normal range and within 360 days    Creat  Date Value Ref Range Status  06/17/2019 0.74 0.70 - 1.33 mg/dL Final    Comment:    For patients >79 years of age, the reference limit for Creatinine is approximately 13% higher for people identified as African-American. .    Creatinine, Urine  Date Value Ref Range Status  06/17/2019 56 20 - 320 mg/dL Final         Passed - Patient is not pregnant      Passed - Valid encounter within last 12 months    Recent Outpatient Visits          1 month ago Type 2 diabetes mellitus with diabetic nephropathy, without long-term current use of insulin [facility])   [facility] [place], Drue Stager, MD   4 months ago Type 2 diabetes mellitus with diabetic nephropathy, without long-term current use of insulin [facility])   [facility] [place], Drue Stager, MD   7 months ago Type 2 diabetes mellitus with diabetic nephropathy, without long-term current use of insulin [facility])   [facility] [place], Drue Stager, MD   8 months ago Migraine without aura and without status migrainosus, not intractable   [facility] [place], Drue Stager, MD   10 months ago Type 2 diabetes mellitus with diabetic nephropathy, without long-term current use of insulin [facility])   [facility] Steele Sizer, MD      Future Appointments            In 1 month Ancil Boozer, Drue Stager, MD [facility], [facility]

## 2019-09-02 ENCOUNTER — Other Ambulatory Visit: Payer: Self-pay

## 2019-09-02 ENCOUNTER — Encounter: Payer: Self-pay | Admitting: Family Medicine

## 2019-09-02 ENCOUNTER — Ambulatory Visit: Payer: 59 | Admitting: Family Medicine

## 2019-09-02 VITALS — BP 120/80 | HR 97 | Temp 97.1°F | Resp 16 | Ht 68.0 in | Wt 220.6 lb

## 2019-09-02 DIAGNOSIS — E7800 Pure hypercholesterolemia, unspecified: Secondary | ICD-10-CM

## 2019-09-02 DIAGNOSIS — E781 Pure hyperglyceridemia: Secondary | ICD-10-CM

## 2019-09-02 DIAGNOSIS — I25118 Atherosclerotic heart disease of native coronary artery with other forms of angina pectoris: Secondary | ICD-10-CM

## 2019-09-02 DIAGNOSIS — K219 Gastro-esophageal reflux disease without esophagitis: Secondary | ICD-10-CM | POA: Diagnosis not present

## 2019-09-02 DIAGNOSIS — J3089 Other allergic rhinitis: Secondary | ICD-10-CM

## 2019-09-02 DIAGNOSIS — R252 Cramp and spasm: Secondary | ICD-10-CM

## 2019-09-02 DIAGNOSIS — E1121 Type 2 diabetes mellitus with diabetic nephropathy: Secondary | ICD-10-CM

## 2019-09-02 DIAGNOSIS — E785 Hyperlipidemia, unspecified: Secondary | ICD-10-CM

## 2019-09-02 DIAGNOSIS — I10 Essential (primary) hypertension: Secondary | ICD-10-CM

## 2019-09-02 DIAGNOSIS — E1169 Type 2 diabetes mellitus with other specified complication: Secondary | ICD-10-CM

## 2019-09-02 DIAGNOSIS — G43009 Migraine without aura, not intractable, without status migrainosus: Secondary | ICD-10-CM

## 2019-09-02 LAB — POCT GLYCOSYLATED HEMOGLOBIN (HGB A1C): Hemoglobin A1C: 6.6 % — AB (ref 4.0–5.6)

## 2019-09-02 MED ORDER — LISINOPRIL 2.5 MG PO TABS: 2.5000 mg | ORAL_TABLET | Freq: Every day | ORAL | 5 refills | Status: DC

## 2019-09-02 MED ORDER — BD PEN NEEDLE NANO U/F 32G X 4 MM MISC: 12 refills | Status: DC

## 2019-09-02 MED ORDER — PANTOPRAZOLE SODIUM 40 MG PO TBEC: 40.0000 mg | DELAYED_RELEASE_TABLET | Freq: Every day | ORAL | 3 refills | Status: DC

## 2019-09-02 MED ORDER — CLOPIDOGREL BISULFATE 75 MG PO TABS: 75.0000 mg | ORAL_TABLET | Freq: Every day | ORAL | 5 refills | Status: DC

## 2019-09-02 MED ORDER — XIGDUO XR 5-1000 MG PO TB24: 1.0000 | ORAL_TABLET | Freq: Two times a day (BID) | ORAL | 5 refills | Status: DC

## 2019-09-02 MED ORDER — METAXALONE 800 MG PO TABS: 800.0000 mg | ORAL_TABLET | Freq: Three times a day (TID) | ORAL | 0 refills | Status: DC

## 2019-09-02 MED ORDER — METOPROLOL SUCCINATE ER 25 MG PO TB24: 12.5000 mg | ORAL_TABLET | Freq: Every day | ORAL | 5 refills | Status: DC

## 2019-09-02 MED ORDER — EZETIMIBE 10 MG PO TABS: 10.0000 mg | ORAL_TABLET | Freq: Every day | ORAL | 5 refills | Status: DC

## 2019-09-02 NOTE — Progress Notes (Signed)
Name: Jesse Lawrence.   MRN: [id number]    DOB: [birth year]   Date:09/02/2019       Progress Note  Subjective  Chief Complaint  Chief Complaint  Patient presents with  . Follow-up  . Diabetes  . Hypertension  . Hyperlipidemia    HPI  DM II : His hgbA1Cwastrending up, 7.1%,7.9%,8% but he started to follow aCarbohydrate restrictive dietand he was  doing very well with dietary modification and A1C went down to 5.7%, he is gradually splurging more and went up to 6.5% last visit it was up to 7.3%, today it is down to 6.6% . He is off Novolog since August 2020 , he is on Xigduo 5/100 two daily and alsoXultophy 50 units daily, he has been packing lunch and snacks, replacing sweets with keto desserts, he is avoiding fries when he eats fast food  . FSBS at home has always below 140 during the day   Insomnia:he was doing well and sleeping through the night, but left shoulder was bothering him but states so tired at night that he is not waking up during the night  HTN/CAD: no chest pain, no palpitation, no longer having dizziness, on low dose ACE and half pill metoprolol once He is on Plavix, statin and Zetia., Vascepa., beta-blocker . He has been doing well without sob or chest pain at this time, but he is on medication to controlled symptoms   ED: he has difficulty maintaining and erection,he used to take Cialis, now on viagra and denies side effects . He still has refills   OSA: he has not been compliant with CPAP machine, he cannot tolerated. I explained importance of compliance with therapy.He has not been waking up during the night lately  Migraine headache: he states he has a history of migraines, used to go to [facility] when severe and has seen a headache specialist about 20 years ago.He states since taking Elavil at night he has been doing well . We will stop baclofen since on metaxalone   GERD: he states he is taking pepcid and is no longer working, having heartburn  almost daily , no regurgitation.    Patient Active Problem List   Diagnosis Date Noted  . Coronary artery disease of native artery of native heart with stable angina pectoris ([place]) 11/26/2018  . OSA (obstructive sleep apnea) 05/21/2015  . Perennial allergic rhinitis 11/07/2014  . Arteriosclerosis of coronary artery 11/07/2014  . CD (contact dermatitis) 11/07/2014  . Decreased libido 11/07/2014  . Diabetes mellitus with renal manifestation ([place]) 11/07/2014  . Gastro-esophageal reflux disease without esophagitis 11/07/2014  . H/O acute myocardial infarction 11/07/2014  . Hypercholesteremia 11/07/2014  . Benign hypertension 11/07/2014  . Hypertriglyceridemia 11/07/2014  . H/O high risk medication treatment 11/07/2014  . NASH (nonalcoholic steatohepatitis) 11/07/2014  . Microalbuminuria 11/07/2014  . Adult BMI 30+ 11/07/2014  . Fungal infection of toenail 11/07/2014  . Tobacco abuse 11/07/2014  . ED (erectile dysfunction) of organic origin 09/29/2006    Past Surgical History:  Procedure Laterality Date  . cadiac stenting    . CYSTECTOMY     59 years of age  . KNEE ARTHROSCOPY      Family History  Problem Relation Age of Onset  . Diabetes Mother   . CAD Mother   . Heart disease Mother   . Heart attack Mother   . Heart disease Father   . Heart attack Father     Social History   Tobacco Use  . Smoking  status: Former Smoker    Packs/day: 1.50    Years: 30.00    Pack years: 45.00    Types: Cigarettes    Quit date: 04/19/2013    Years since quitting: 6.3  . Smokeless tobacco: Current User  Substance Use Topics  . Alcohol use: No    Alcohol/week: 0.0 standard drinks    Comment: occ     Current Outpatient Medications:  .  atorvastatin (LIPITOR) 80 MG tablet, Take 1 tablet (80 mg total) by mouth daily at 6 PM., Disp: 30 tablet, Rfl: 5 .  clopidogrel (PLAVIX) 75 MG tablet, Take 1 tablet (75 mg total) by mouth daily., Disp: 30 tablet, Rfl: 5 .  Dapagliflozin-metFORMIN  HCl ER (XIGDUO XR) 08-998 MG TB24, Take 1 tablet by mouth 2 (two) times daily., Disp: 60 tablet, Rfl: 5 .  ezetimibe (ZETIA) 10 MG tablet, Take 1 tablet (10 mg total) by mouth daily., Disp: 30 tablet, Rfl: 5 .  fluticasone (FLONASE) 50 MCG/ACT nasal spray, Place 2 sprays into both nostrils as needed., Disp: 48 g, Rfl: 1 .  icosapent Ethyl (VASCEPA) 1 g capsule, TAKE 2 CAPSULES BY MOUTH 2 (TWO) TIMES DAILY., Disp: 120 capsule, Rfl: 5 .  Insulin Degludec-Liraglutide (XULTOPHY) 100-3.6 UNIT-MG/ML SOPN, Inject 50 Units into the skin daily., Disp: 15 pen, Rfl: 5 .  Insulin Pen Needle (BD PEN NEEDLE NANO U/F) 32G X 4 MM MISC, USE AS DIRECTED. E11.29, Disp: 100 each, Rfl: 12 .  lisinopril (ZESTRIL) 2.5 MG tablet, Take 1 tablet (2.5 mg total) by mouth daily., Disp: 30 tablet, Rfl: 5 .  loratadine (CLARITIN) 10 MG tablet, Take 1 tablet by mouth daily., Disp: , Rfl:  .  meloxicam (MOBIC) 15 MG tablet, TAKE 1 TABLET BY MOUTH EVERY DAY, Disp: 30 tablet, Rfl: 0 .  metaxalone (SKELAXIN) 800 MG tablet, Take 1 tablet (800 mg total) by mouth 3 (three) times daily., Disp: 90 tablet, Rfl: 0 .  metoprolol succinate (TOPROL-XL) 25 MG 24 hr tablet, Take 0.5 tablets (12.5 mg total) by mouth daily., Disp: 15 tablet, Rfl: 5 .  nortriptyline (PAMELOR) 25 MG capsule, Take 1 capsule (25 mg total) by mouth at bedtime., Disp: 30 capsule, Rfl: 5 .  ONETOUCH ULTRA test strip, USE AS DIRECTED, Disp: 100 strip, Rfl: 11 .  sildenafil (VIAGRA) 100 MG tablet, Take 0.5-1 tablets (50-100 mg total) by mouth daily as needed for erectile dysfunction., Disp: 30 tablet, Rfl: 0 .  pantoprazole (PROTONIX) 40 MG tablet, Take 1 tablet (40 mg total) by mouth daily., Disp: 30 tablet, Rfl: 3  No Known Allergies  I personally reviewed active problem list, medication list, allergies, family history, social history, health maintenance with the patient/caregiver today.   ROS  Constitutional: Negative for fever, positive for  weight change.   Respiratory: Negative for cough and shortness of breath.   Cardiovascular: Negative for chest pain or palpitations.  Gastrointestinal: Negative for abdominal pain, no bowel changes.  Musculoskeletal: Negative for gait problem or joint swelling.  Skin: Negative for rash.  Neurological: Negative for dizziness or headache.  No other specific complaints in a complete review of systems (except as listed in HPI above).  Objective  Vitals:   09/02/19 1015  BP: 120/80  Pulse: 97  Resp: 16  Temp: (!) 97.1 F (36.2 C)  TempSrc: Temporal  SpO2: 94%  Weight: 220 lb 9.6 oz (100.1 kg)  Height: 5' 8"  (1.727 m)    Body mass index is 33.54 kg/m.  Physical Exam  Constitutional: Patient  appears well-developed and well-nourished. Obese No distress.  HEENT: head atraumatic, normocephalic, pupils equal and reactive to light,neck supple Cardiovascular: Normal rate, regular rhythm and normal heart sounds.  No murmur heard. No BLE edema. Pulmonary/Chest: Effort normal and breath sounds normal. No respiratory distress. Abdominal: Soft.  There is no tenderness. Psychiatric: Patient has a normal mood and affect. behavior is normal. Judgment and thought content normal.  Recent Results (from the past 2160 hour(s))  CBC with Differential/Platelet     Status: Abnormal   Collection Time: 06/17/19  8:44 AM  Result Value Ref Range   WBC 8.0 3.8 - 10.8 Thousand/uL   RBC 5.42 4.20 - 5.80 Million/uL   Hemoglobin 16.7 13.2 - 17.1 g/dL   HCT 50.7 (H) 38.5 - 50.0 %   MCV 93.5 80.0 - 100.0 fL   MCH 30.8 27.0 - 33.0 pg   MCHC 32.9 32.0 - 36.0 g/dL   RDW 12.1 11.0 - 15.0 %   Platelets 286 140 - 400 Thousand/uL   MPV 10.7 7.5 - 12.5 fL   Neutro Abs 4,832 1,500 - 7,800 cells/uL   Lymphs Abs 2,320 850 - 3,900 cells/uL   Absolute Monocytes 720 200 - 950 cells/uL   Eosinophils Absolute 80 15 - 500 cells/uL   Basophils Absolute 48 0 - 200 cells/uL   Neutrophils Relative % 60.4 %   Total Lymphocyte 29.0 %    Monocytes Relative 9.0 %   Eosinophils Relative 1.0 %   Basophils Relative 0.6 %  COMPLETE METABOLIC PANEL WITH GFR     Status: Abnormal   Collection Time: 06/17/19  8:44 AM  Result Value Ref Range   Glucose, Bld 122 (H) 65 - 99 mg/dL    Comment: .            Fasting reference interval . For someone without known diabetes, a glucose value between 100 and 125 mg/dL is consistent with prediabetes and should be confirmed with a follow-up test. .    BUN 20 7 - 25 mg/dL   Creat 0.74 0.70 - 1.33 mg/dL    Comment: For patients >60 years of age, the reference limit for Creatinine is approximately 13% higher for people identified as African-American. .    GFR, Est Non African American 103 > OR = 60 mL/min/1.32m   GFR, Est African American 120 > OR = 60 mL/min/1.733m  BUN/Creatinine Ratio NOT APPLICABLE 6 - 22 (calc)   Sodium 136 135 - 146 mmol/L   Potassium 4.4 3.5 - 5.3 mmol/L   Chloride 100 98 - 110 mmol/L   CO2 26 20 - 32 mmol/L   Calcium 10.0 8.6 - 10.3 mg/dL   Total Protein 7.5 6.1 - 8.1 g/dL   Albumin 4.7 3.6 - 5.1 g/dL   Globulin 2.8 1.9 - 3.7 g/dL (calc)   AG Ratio 1.7 1.0 - 2.5 (calc)   Total Bilirubin 1.5 (H) 0.2 - 1.2 mg/dL   Alkaline phosphatase (APISO) 64 35 - 144 U/L   AST 15 10 - 35 U/L   ALT 25 9 - 46 U/L  Lipid panel     Status: Abnormal   Collection Time: 06/17/19  8:44 AM  Result Value Ref Range   Cholesterol 119 <200 mg/dL   HDL 30 (L) > OR = 40 mg/dL   Triglycerides 276 (H) <150 mg/dL    Comment: . If a non-fasting specimen was collected, consider repeat triglyceride testing on a fasting specimen if clinically indicated.  JaYates Decampt al. J.Lenna Sciaraof  Clin. Lipidol. 7121;9:758-832. Marland Kitchen    LDL Cholesterol (Calc) 57 mg/dL (calc)    Comment: Reference range: <100 . Desirable range <100 mg/dL for primary prevention;   <70 mg/dL for patients with CHD or diabetic patients  with > or = 2 CHD risk factors. Marland Kitchen LDL-C is now calculated using the Martin-Hopkins   calculation, which is a validated novel method providing  better accuracy than the Friedewald equation in the  estimation of LDL-C.  Cresenciano Genre et al. Annamaria Helling. 5498;264(15): 2061-2068  (http://education.[facility].com/faq/FAQ164)    Total CHOL/HDL Ratio 4.0 <5.0 (calc)   Non-HDL Cholesterol (Calc) 89 <130 mg/dL (calc)    Comment: For patients with diabetes plus 1 major ASCVD risk  factor, treating to a non-HDL-C goal of <100 mg/dL  (LDL-C of <70 mg/dL) is considered a therapeutic  option.   PSA     Status: None   Collection Time: 06/17/19  8:44 AM  Result Value Ref Range   PSA 1.1 < OR = 4.0 ng/mL    Comment: The total PSA value from this assay system is  standardized against the WHO standard. The test  result will be approximately 20% lower when compared  to the equimolar-standardized total PSA (Beckman  Coulter). Comparison of serial PSA results should be  interpreted with this fact in mind. . This test was performed using the Siemens  chemiluminescent method. Values obtained from  different assay methods cannot be used interchangeably. PSA levels, regardless of value, should not be interpreted as absolute evidence of the presence or absence of disease.   Microalbumin / creatinine urine ratio     Status: Abnormal   Collection Time: 06/17/19  4:11 PM  Result Value Ref Range   Creatinine, Urine 56 20 - 320 mg/dL   Microalb, Ur 4.8 mg/dL    Comment: Reference Range Not established    Microalb Creat Ratio 86 (H) <30 mcg/mg creat    Comment: . The ADA defines abnormalities in albumin excretion as follows: Marland Kitchen Category         Result (mcg/mg creatinine) . Normal                    <30 Microalbuminuria         30-299  Clinical albuminuria   > OR = 300 . The ADA recommends that at least two of three specimens collected within a 3-6 month period be abnormal before considering a patient to be within a diagnostic category.   POCT HgB A1C     Status: Abnormal    Collection Time: 09/02/19 10:17 AM  Result Value Ref Range   Hemoglobin A1C 6.6 (A) 4.0 - 5.6 %   HbA1c POC (<> result, manual entry)     HbA1c, POC (prediabetic range)     HbA1c, POC (controlled diabetic range)       PHQ2/9: Depression screen [facility] 2/9 09/02/2019 06/03/2019 02/25/2019 11/09/2018 08/26/2018  Decreased Interest 0 0 0 0 0  Down, Depressed, Hopeless 0 0 0 0 0  PHQ - 2 Score 0 0 0 0 0  Altered sleeping 0 0 0 0 0  Tired, decreased energy 0 0 0 0 0  Change in appetite 0 0 0 0 0  Feeling bad or failure about yourself  0 0 0 0 0  Trouble concentrating 0 0 0 0 0  Moving slowly or fidgety/restless 0 0 0 0 0  Suicidal thoughts 0 0 0 0 0  PHQ-9 Score 0 0 0 0 0  Difficult  doing work/chores - - - Not difficult at all Not difficult at all  Some recent data might be hidden    phq 9 is negative   Fall Risk: Fall Risk  09/02/2019 11/26/2018 11/09/2018 08/26/2018 02/26/2018  Falls in the past year? 0 0 0 0 0  Number falls in past yr: 0 0 0 0 -  Injury with Fall? 0 0 0 0 -      Functional Status Survey: Is the patient deaf or have difficulty hearing?: No Does the patient have difficulty seeing, even when wearing glasses/contacts?: No Does the patient have difficulty concentrating, remembering, or making decisions?: No Does the patient have difficulty walking or climbing stairs?: No Does the patient have difficulty dressing or bathing?: No Does the patient have difficulty doing errands alone such as visiting a doctor's office or shopping?: No    Assessment & Plan   1. Type 2 diabetes mellitus with diabetic nephropathy, without long-term current use of insulin (HCC)  - POCT HgB A1C - Insulin Pen Needle (BD PEN NEEDLE NANO U/F) 32G X 4 MM MISC; USE AS DIRECTED. E11.29  Dispense: 100 each; Refill: 12 - Dapagliflozin-metFORMIN HCl ER (XIGDUO XR) 08-998 MG TB24; Take 1 tablet by mouth 2 (two) times daily.  Dispense: 60 tablet; Refill: 5 - lisinopril (ZESTRIL) 2.5 MG tablet; Take 1  tablet (2.5 mg total) by mouth daily.  Dispense: 30 tablet; Refill: 5  2. Dyslipidemia associated with type 2 diabetes mellitus (HCC)  - Dapagliflozin-metFORMIN HCl ER (XIGDUO XR) 08-998 MG TB24; Take 1 tablet by mouth 2 (two) times daily.  Dispense: 60 tablet; Refill: 5  3. Hypercholesteremia  - ezetimibe (ZETIA) 10 MG tablet; Take 1 tablet (10 mg total) by mouth daily.  Dispense: 30 tablet; Refill: 5  4. Gastro-esophageal reflux disease without esophagitis  - pantoprazole (PROTONIX) 40 MG tablet; Take 1 tablet (40 mg total) by mouth daily.  Dispense: 30 tablet; Refill: 3  5. Perennial allergic rhinitis   6. Hypertriglyceridemia   7. Benign hypertension  - lisinopril (ZESTRIL) 2.5 MG tablet; Take 1 tablet (2.5 mg total) by mouth daily.  Dispense: 30 tablet; Refill: 5 - metoprolol succinate (TOPROL-XL) 25 MG 24 hr tablet; Take 0.5 tablets (12.5 mg total) by mouth daily.  Dispense: 15 tablet; Refill: 5  8. Migraine without aura and without status migrainosus, not intractable   9. Muscle cramp  - metaxalone (SKELAXIN) 800 MG tablet; Take 1 tablet (800 mg total) by mouth 3 (three) times daily.  Dispense: 90 tablet; Refill: 0  10. Coronary artery disease of native artery of native heart with stable angina pectoris (HCC)  - lisinopril (ZESTRIL) 2.5 MG tablet; Take 1 tablet (2.5 mg total) by mouth daily.  Dispense: 30 tablet; Refill: 5 - metoprolol succinate (TOPROL-XL) 25 MG 24 hr tablet; Take 0.5 tablets (12.5 mg total) by mouth daily.  Dispense: 15 tablet; Refill: 5 - clopidogrel (PLAVIX) 75 MG tablet; Take 1 tablet (75 mg total) by mouth daily.  Dispense: 30 tablet; Refill: 5

## 2019-09-06 ENCOUNTER — Other Ambulatory Visit: Payer: Self-pay | Admitting: Family Medicine

## 2019-09-06 ENCOUNTER — Ambulatory Visit: Payer: 59 | Admitting: Family Medicine

## 2019-09-06 DIAGNOSIS — G43009 Migraine without aura, not intractable, without status migrainosus: Secondary | ICD-10-CM

## 2019-10-12 ENCOUNTER — Telehealth: Payer: Self-pay

## 2019-10-12 NOTE — Telephone Encounter (Signed)
Prior Authorization was initiated for Vascepa and   Coverage Start Date:10/11/2019;Coverage End Date:10/10/2020;

## 2019-10-14 ENCOUNTER — Other Ambulatory Visit: Payer: Self-pay | Admitting: Family Medicine

## 2019-10-14 DIAGNOSIS — M62838 Other muscle spasm: Secondary | ICD-10-CM

## 2019-11-01 ENCOUNTER — Other Ambulatory Visit: Payer: Self-pay | Admitting: Family Medicine

## 2019-11-01 DIAGNOSIS — G43009 Migraine without aura, not intractable, without status migrainosus: Secondary | ICD-10-CM

## 2019-11-01 NOTE — Telephone Encounter (Signed)
Requested Prescriptions  Pending Prescriptions Disp Refills  . nortriptyline (PAMELOR) 25 MG capsule [Pharmacy Med Name: NORTRIPTYLINE HCL 25 MG CAP] 90 capsule 1    Sig: TAKE 1 CAPSULE (25 MG TOTAL) BY MOUTH AT BEDTIME.     Psychiatry:  Antidepressants - Heterocyclics (TCAs) Passed - 11/01/2019  1:31 PM      Passed - Valid encounter within last 6 months    Recent Outpatient Visits          2 months ago Type 2 diabetes mellitus with diabetic nephropathy, without long-term current use of insulin [facility])   [facility] [place], Drue Stager, MD   5 months ago Type 2 diabetes mellitus with diabetic nephropathy, without long-term current use of insulin [facility])   [facility] [place], Drue Stager, MD   8 months ago Type 2 diabetes mellitus with diabetic nephropathy, without long-term current use of insulin [facility])   [facility] [place], Drue Stager, MD   11 months ago Type 2 diabetes mellitus with diabetic nephropathy, without long-term current use of insulin [facility])   [facility] [place], Drue Stager, MD   11 months ago Migraine without aura and without status migrainosus, not intractable   [facility] Steele Sizer, MD      Future Appointments            In 1 month Ancil Boozer, Drue Stager, MD [facility], [place]   In 2 months Gollan, Kathlene November, MD [facility]

## 2019-12-04 ENCOUNTER — Other Ambulatory Visit: Payer: Self-pay | Admitting: Family Medicine

## 2019-12-04 DIAGNOSIS — E1169 Type 2 diabetes mellitus with other specified complication: Secondary | ICD-10-CM

## 2019-12-04 DIAGNOSIS — E1121 Type 2 diabetes mellitus with diabetic nephropathy: Secondary | ICD-10-CM

## 2019-12-08 NOTE — Progress Notes (Signed)
Name: Jesse Lawrence.   MRN: [id number]    DOB: 04/04/1961   Date:12/09/2019       Progress Note  Subjective  Chief Complaint  3 month follow up for diabetes management.   HPI  DM II : His hgbA1Cwastrending up, 7.1%,7.9%,8% but he started to follow acarbohydrate restrictive dietand hewasdoing very well with dietary modification and A1C went down to 5.7%,  6.5% and he started to splurge and it went up to 7.3%, but  down to 6.6% and today is stable 6.6%  He is off Novolog since August 2020 , he is on Xigduo 5/100 two daily and alsoXultophy50 units daily, he has been packing lunch and snacks, replacing sweets with keto desserts, he is avoiding fries when he eats fast food , sometimes runs to the grocery store for healthy snacks if needed.Checking fsbs at least once a day,fasting 96-132, post-prandially highest level of 178   Left shoulder pain seen by Ortho, diagnosed with bone spur and osteoarthritis, he had steroid injection and went to one PT session because he works too far, but he has been doing his home PT and rom has improving. [facility]   HTN/CAD:no palpitation, denies chest pain described as tightness ( having heart burn and dysphagia) , no longer having dizziness, on low dose ACE and half pill metoprolol onceHe is on Plavix, statin and Zetia., Vascepa., beta-blocker .He has follow up with cardiologist - Dr. Rockey Situ   ED: he has difficulty maintaining and erection,he used to take Cialis, now on viagraand denies side effects.Unchnaged   OSA: he has not been compliant with CPAP machine, he cannot tolerated it, he is aware of the importance of wearing it   Migraine headache: he states he has a history of migraines, used to go to [facility] when severe and has seen a headache specialist about 20 years ago.He states since taking Elavil at night he has been doing better , average migraine is 2 times per month   GERD: he is having odynophagia, dysphagia, heartburn even  on pantoprazole, he is ready to see GI, also worried about NASH He has been eating more tomatoes lately   Patient Active Problem List   Diagnosis Date Noted  . Coronary artery disease of native artery of native heart with stable angina pectoris ([place]) 11/26/2018  . OSA (obstructive sleep apnea) 05/21/2015  . Perennial allergic rhinitis 11/07/2014  . Arteriosclerosis of coronary artery 11/07/2014  . CD (contact dermatitis) 11/07/2014  . Decreased libido 11/07/2014  . Diabetes mellitus with renal manifestation ([place]) 11/07/2014  . Gastro-esophageal reflux disease without esophagitis 11/07/2014  . H/O acute myocardial infarction 11/07/2014  . Hypercholesteremia 11/07/2014  . Benign hypertension 11/07/2014  . Hypertriglyceridemia 11/07/2014  . H/O high risk medication treatment 11/07/2014  . NASH (nonalcoholic steatohepatitis) 11/07/2014  . Microalbuminuria 11/07/2014  . Adult BMI 30+ 11/07/2014  . Fungal infection of toenail 11/07/2014  . Tobacco abuse 11/07/2014  . ED (erectile dysfunction) of organic origin 09/29/2006    Past Surgical History:  Procedure Laterality Date  . cadiac stenting    . CYSTECTOMY     59 years of age  . KNEE ARTHROSCOPY      Family History  Problem Relation Age of Onset  . Diabetes Mother   . CAD Mother   . Heart disease Mother   . Heart attack Mother   . Heart disease Father   . Heart attack Father     Social History   Tobacco Use  . Smoking status:  Former Smoker    Packs/day: 1.50    Years: 30.00    Pack years: 45.00    Types: Cigarettes    Quit date: 04/19/2013    Years since quitting: 6.6  . Smokeless tobacco: Current User  Substance Use Topics  . Alcohol use: No    Alcohol/week: 0.0 standard drinks    Comment: occ     Current Outpatient Medications:  .  atorvastatin (LIPITOR) 80 MG tablet, Take 1 tablet (80 mg total) by mouth daily at 6 PM., Disp: 90 tablet, Rfl: 1 .  clopidogrel (PLAVIX) 75 MG tablet, Take 1 tablet (75 mg  total) by mouth daily., Disp: 90 tablet, Rfl: 1 .  Dapagliflozin-metFORMIN HCl ER (XIGDUO XR) 08-998 MG TB24, Take 1 tablet by mouth 2 (two) times daily., Disp: 180 tablet, Rfl: 1 .  ezetimibe (ZETIA) 10 MG tablet, Take 1 tablet (10 mg total) by mouth daily., Disp: 90 tablet, Rfl: 1 .  fluticasone (FLONASE) 50 MCG/ACT nasal spray, Place 2 sprays into both nostrils as needed., Disp: 48 g, Rfl: 1 .  icosapent Ethyl (VASCEPA) 1 g capsule, TAKE 2 CAPSULES BY MOUTH 2 (TWO) TIMES DAILY., Disp: 360 capsule, Rfl: 1 .  Insulin Degludec-Liraglutide (XULTOPHY) 100-3.6 UNIT-MG/ML SOPN, Inject 50 Units into the skin daily., Disp: 45 mL, Rfl: 0 .  Insulin Pen Needle (BD PEN NEEDLE NANO U/F) 32G X 4 MM MISC, USE AS DIRECTED. E11.29, Disp: 100 each, Rfl: 12 .  lisinopril (ZESTRIL) 2.5 MG tablet, Take 1 tablet (2.5 mg total) by mouth daily., Disp: 90 tablet, Rfl: 1 .  loratadine (CLARITIN) 10 MG tablet, Take 1 tablet by mouth daily., Disp: , Rfl:  .  meloxicam (MOBIC) 15 MG tablet, TAKE 1 TABLET BY MOUTH EVERY DAY, Disp: 30 tablet, Rfl: 0 .  metaxalone (SKELAXIN) 800 MG tablet, Take 1 tablet (800 mg total) by mouth 3 (three) times daily., Disp: 90 tablet, Rfl: 0 .  metoprolol succinate (TOPROL-XL) 25 MG 24 hr tablet, Take 0.5 tablets (12.5 mg total) by mouth daily., Disp: 45 tablet, Rfl: 1 .  nortriptyline (PAMELOR) 25 MG capsule, TAKE 1 CAPSULE (25 MG TOTAL) BY MOUTH AT BEDTIME., Disp: 90 capsule, Rfl: 1 .  ONETOUCH ULTRA test strip, USE AS DIRECTED, Disp: 100 strip, Rfl: 11 .  pantoprazole (PROTONIX) 40 MG tablet, Take 1 tablet (40 mg total) by mouth 2 (two) times daily., Disp: 180 tablet, Rfl: 0 .  sildenafil (VIAGRA) 100 MG tablet, Take 0.5-1 tablets (50-100 mg total) by mouth daily as needed for erectile dysfunction., Disp: 30 tablet, Rfl: 0  No Known Allergies  I personally reviewed active problem list, medication list, allergies, family history, social history, health maintenance with the patient/caregiver  today.   ROS  Constitutional: Negative for fever , positive for mild  weight change.  Respiratory: Negative for cough and shortness of breath.   Cardiovascular: Negative for chest pain or palpitations.  Gastrointestinal: Negative for abdominal pain, no bowel changes.  Musculoskeletal: Negative for gait problem or joint swelling.  Skin: Negative for rash.  Neurological: Negative for dizziness, positive for intermittent  headache.  No other specific complaints in a complete review of systems (except as listed in HPI above).  Objective  Vitals:   12/09/19 1026  BP: 120/86  Pulse: 98  Resp: 16  Temp: 97.6 F (36.4 C)  TempSrc: Oral  SpO2: 98%  Weight: 214 lb (97.1 kg)  Height: 5' 8"  (1.727 m)    Body mass index is 32.54 kg/m.  Physical  Exam  Constitutional: Patient appears well-developed and well-nourished. Obese  No distress.  HEENT: head atraumatic, normocephalic, pupils equal and reactive to light,  neck supple Cardiovascular: Normal rate, regular rhythm and normal heart sounds.  No murmur heard. No BLE edema. Pulmonary/Chest: Effort normal and breath sounds normal. No respiratory distress. Abdominal: Soft.  There is no tenderness. Psychiatric: Patient has a normal mood and affect. behavior is normal. Judgment and thought content normal.  Recent Results (from the past 2160 hour(s))  POCT HgB A1C     Status: Abnormal   Collection Time: 12/09/19 10:29 AM  Result Value Ref Range   Hemoglobin A1C 6.6 (A) 4.0 - 5.6 %   HbA1c POC (<> result, manual entry)     HbA1c, POC (prediabetic range)     HbA1c, POC (controlled diabetic range)      Diabetic Foot Exam: Diabetic Foot Exam - Simple   Simple Foot Form Diabetic Foot exam was performed with the following findings: Yes 12/09/2019 11:04 AM  Visual Inspection See comments: Yes Sensation Testing Intact to touch and monofilament testing bilaterally: Yes Pulse Check Posterior Tibialis and Dorsalis pulse intact bilaterally:  Yes Comments Dry skin and thick toenails, birth mark on left middle toe  - purplish       PHQ2/9: Depression screen [facility] 2/9 12/09/2019 09/02/2019 06/03/2019 02/25/2019 11/09/2018  Decreased Interest 0 0 0 0 0  Down, Depressed, Hopeless 0 0 0 0 0  PHQ - 2 Score 0 0 0 0 0  Altered sleeping 0 0 0 0 0  Tired, decreased energy 0 0 0 0 0  Change in appetite 0 0 0 0 0  Feeling bad or failure about yourself  0 0 0 0 0  Trouble concentrating 0 0 0 0 0  Moving slowly or fidgety/restless 0 0 0 0 0  Suicidal thoughts 0 0 0 0 0  PHQ-9 Score 0 0 0 0 0  Difficult doing work/chores - - - - Not difficult at all  Some recent data might be hidden    phq 9 is negative   Fall Risk: Fall Risk  12/09/2019 09/02/2019 11/26/2018 11/09/2018 08/26/2018  Falls in the past year? 0 0 0 0 0  Number falls in past yr: 0 0 0 0 0  Injury with Fall? 0 0 0 0 0     Functional Status Survey: Is the patient deaf or have difficulty hearing?: No Does the patient have difficulty seeing, even when wearing glasses/contacts?: No Does the patient have difficulty concentrating, remembering, or making decisions?: No Does the patient have difficulty walking or climbing stairs?: No Does the patient have difficulty dressing or bathing?: No Does the patient have difficulty doing errands alone such as visiting a doctor's office or shopping?: No    Assessment & Plan  1. Type 2 diabetes mellitus with diabetic nephropathy, without long-term current use of insulin (HCC)  - POCT HgB A1C - Dapagliflozin-metFORMIN HCl ER (XIGDUO XR) 08-998 MG TB24; Take 1 tablet by mouth 2 (two) times daily.  Dispense: 180 tablet; Refill: 1 - lisinopril (ZESTRIL) 2.5 MG tablet; Take 1 tablet (2.5 mg total) by mouth daily.  Dispense: 90 tablet; Refill: 1 - Insulin Degludec-Liraglutide (XULTOPHY) 100-3.6 UNIT-MG/ML SOPN; Inject 50 Units into the skin daily.  Dispense: 45 mL; Refill: 0  2. NASH (nonalcoholic steatohepatitis)  - Ambulatory referral to  Gastroenterology  3. Gastro-esophageal reflux disease without esophagitis  - Ambulatory referral to Gastroenterology Increase dose of pantoprazole for now and resume a gerd diet -  pantoprazole (PROTONIX) 40 MG tablet; Take 1 tablet (40 mg total) by mouth BID   Dispense: 180 tablet; Refill: 0  4. Dyslipidemia associated with type 2 diabetes mellitus (HCC)  - icosapent Ethyl (VASCEPA) 1 g capsule; TAKE 2 CAPSULES BY MOUTH 2 (TWO) TIMES DAILY.  Dispense: 360 capsule; Refill: 1 - Dapagliflozin-metFORMIN HCl ER (XIGDUO XR) 08-998 MG TB24; Take 1 tablet by mouth 2 (two) times daily.  Dispense: 180 tablet; Refill: 1 - Insulin Degludec-Liraglutide (XULTOPHY) 100-3.6 UNIT-MG/ML SOPN; Inject 50 Units into the skin daily.  Dispense: 45 mL; Refill: 0  5. Hypercholesteremia  - ezetimibe (ZETIA) 10 MG tablet; Take 1 tablet (10 mg total) by mouth daily.  Dispense: 90 tablet; Refill: 1 - atorvastatin (LIPITOR) 80 MG tablet; Take 1 tablet (80 mg total) by mouth daily at 6 PM.  Dispense: 90 tablet; Refill: 1  6. Perennial allergic rhinitis   7. Migraine without aura and without status migrainosus, not intractable   8. Benign hypertension  - lisinopril (ZESTRIL) 2.5 MG tablet; Take 1 tablet (2.5 mg total) by mouth daily.  Dispense: 90 tablet; Refill: 1 - metoprolol succinate (TOPROL-XL) 25 MG 24 hr tablet; Take 0.5 tablets (12.5 mg total) by mouth daily.  Dispense: 45 tablet; Refill: 1  9. Needs flu shot  - Flu Vaccine QUAD 6+ mos PF IM (Fluarix Quad PF)  10. Coronary artery disease of native artery of native heart with stable angina pectoris (HCC)  - clopidogrel (PLAVIX) 75 MG tablet; Take 1 tablet (75 mg total) by mouth daily.  Dispense: 90 tablet; Refill: 1 - lisinopril (ZESTRIL) 2.5 MG tablet; Take 1 tablet (2.5 mg total) by mouth daily.  Dispense: 90 tablet; Refill: 1 - metoprolol succinate (TOPROL-XL) 25 MG 24 hr tablet; Take 0.5 tablets (12.5 mg total) by mouth daily.  Dispense: 45 tablet;  Refill: 1  11. Arteriosclerosis of coronary artery   12. OSA (obstructive sleep apnea)   13. Impingement syndrome of left shoulder  Doing better   14. ED (erectile dysfunction) of organic origin  - sildenafil (VIAGRA) 100 MG tablet; Take 0.5-1 tablets (50-100 mg total) by mouth daily as needed for erectile dysfunction.  Dispense: 30 tablet; Refill: 0  15. BPH associated with nocturia   16. Esophageal dysphagia  - Ambulatory referral to Gastroenterology  17. Hypertriglyceridemia  - icosapent Ethyl (VASCEPA) 1 g capsule; TAKE 2 CAPSULES BY MOUTH 2 (TWO) TIMES DAILY.  Dispense: 360 capsule; Refill: 1  18. Muscle cramp  - metaxalone (SKELAXIN) 800 MG tablet; Take 1 tablet (800 mg total) by mouth 3 (three) times daily.  Dispense: 90 tablet; Refill: 0

## 2019-12-09 ENCOUNTER — Encounter: Payer: Self-pay | Admitting: Family Medicine

## 2019-12-09 ENCOUNTER — Ambulatory Visit (INDEPENDENT_AMBULATORY_CARE_PROVIDER_SITE_OTHER): Payer: Managed Care, Other (non HMO) | Admitting: Family Medicine

## 2019-12-09 ENCOUNTER — Other Ambulatory Visit: Payer: Self-pay

## 2019-12-09 VITALS — BP 120/86 | HR 98 | Temp 97.6°F | Resp 16 | Ht 68.0 in | Wt 214.0 lb

## 2019-12-09 DIAGNOSIS — K7581 Nonalcoholic steatohepatitis (NASH): Secondary | ICD-10-CM

## 2019-12-09 DIAGNOSIS — K219 Gastro-esophageal reflux disease without esophagitis: Secondary | ICD-10-CM

## 2019-12-09 DIAGNOSIS — J3089 Other allergic rhinitis: Secondary | ICD-10-CM

## 2019-12-09 DIAGNOSIS — R351 Nocturia: Secondary | ICD-10-CM

## 2019-12-09 DIAGNOSIS — G4733 Obstructive sleep apnea (adult) (pediatric): Secondary | ICD-10-CM

## 2019-12-09 DIAGNOSIS — I10 Essential (primary) hypertension: Secondary | ICD-10-CM

## 2019-12-09 DIAGNOSIS — Z23 Encounter for immunization: Secondary | ICD-10-CM

## 2019-12-09 DIAGNOSIS — G43009 Migraine without aura, not intractable, without status migrainosus: Secondary | ICD-10-CM

## 2019-12-09 DIAGNOSIS — E1121 Type 2 diabetes mellitus with diabetic nephropathy: Secondary | ICD-10-CM

## 2019-12-09 DIAGNOSIS — R252 Cramp and spasm: Secondary | ICD-10-CM

## 2019-12-09 DIAGNOSIS — R1310 Dysphagia, unspecified: Secondary | ICD-10-CM

## 2019-12-09 DIAGNOSIS — E1169 Type 2 diabetes mellitus with other specified complication: Secondary | ICD-10-CM

## 2019-12-09 DIAGNOSIS — E7800 Pure hypercholesterolemia, unspecified: Secondary | ICD-10-CM

## 2019-12-09 DIAGNOSIS — N529 Male erectile dysfunction, unspecified: Secondary | ICD-10-CM

## 2019-12-09 DIAGNOSIS — E781 Pure hyperglyceridemia: Secondary | ICD-10-CM

## 2019-12-09 DIAGNOSIS — I2510 Atherosclerotic heart disease of native coronary artery without angina pectoris: Secondary | ICD-10-CM

## 2019-12-09 DIAGNOSIS — R1319 Other dysphagia: Secondary | ICD-10-CM

## 2019-12-09 DIAGNOSIS — I25118 Atherosclerotic heart disease of native coronary artery with other forms of angina pectoris: Secondary | ICD-10-CM

## 2019-12-09 DIAGNOSIS — N401 Enlarged prostate with lower urinary tract symptoms: Secondary | ICD-10-CM

## 2019-12-09 DIAGNOSIS — M7542 Impingement syndrome of left shoulder: Secondary | ICD-10-CM

## 2019-12-09 DIAGNOSIS — E785 Hyperlipidemia, unspecified: Secondary | ICD-10-CM

## 2019-12-09 LAB — POCT GLYCOSYLATED HEMOGLOBIN (HGB A1C): Hemoglobin A1C: 6.6 % — AB (ref 4.0–5.6)

## 2019-12-09 MED ORDER — XIGDUO XR 5-1000 MG PO TB24: 1.0000 | ORAL_TABLET | Freq: Two times a day (BID) | ORAL | 1 refills | Status: DC

## 2019-12-09 MED ORDER — LISINOPRIL 2.5 MG PO TABS: 2.5000 mg | ORAL_TABLET | Freq: Every day | ORAL | 1 refills | Status: DC

## 2019-12-09 MED ORDER — SILDENAFIL CITRATE 100 MG PO TABS: 50.0000 mg | ORAL_TABLET | Freq: Every day | ORAL | 0 refills | Status: DC | PRN

## 2019-12-09 MED ORDER — ICOSAPENT ETHYL 1 G PO CAPS: ORAL_CAPSULE | ORAL | 1 refills | Status: DC

## 2019-12-09 MED ORDER — EZETIMIBE 10 MG PO TABS: 10.0000 mg | ORAL_TABLET | Freq: Every day | ORAL | 1 refills | Status: DC

## 2019-12-09 MED ORDER — CLOPIDOGREL BISULFATE 75 MG PO TABS: 75.0000 mg | ORAL_TABLET | Freq: Every day | ORAL | 1 refills | Status: DC

## 2019-12-09 MED ORDER — ATORVASTATIN CALCIUM 80 MG PO TABS: 80.0000 mg | ORAL_TABLET | Freq: Every day | ORAL | 1 refills | Status: DC

## 2019-12-09 MED ORDER — PANTOPRAZOLE SODIUM 40 MG PO TBEC: 40.0000 mg | DELAYED_RELEASE_TABLET | Freq: Two times a day (BID) | ORAL | 0 refills | Status: DC

## 2019-12-09 MED ORDER — METOPROLOL SUCCINATE ER 25 MG PO TB24: 12.5000 mg | ORAL_TABLET | Freq: Every day | ORAL | 1 refills | Status: DC

## 2019-12-09 MED ORDER — XULTOPHY 100-3.6 UNIT-MG/ML ~~LOC~~ SOPN: 50.0000 [IU] | PEN_INJECTOR | Freq: Every day | SUBCUTANEOUS | 0 refills | Status: DC

## 2019-12-09 MED ORDER — METAXALONE 800 MG PO TABS: 800.0000 mg | ORAL_TABLET | Freq: Three times a day (TID) | ORAL | 0 refills | Status: DC

## 2019-12-09 MED ORDER — PANTOPRAZOLE SODIUM 40 MG PO TBEC: 40.0000 mg | DELAYED_RELEASE_TABLET | Freq: Every day | ORAL | 0 refills | Status: DC

## 2020-01-06 NOTE — Progress Notes (Signed)
Cardiology Office Note  Date:  01/09/2020   ID:  Jesse Lawrence., DOB 1961-01-20, MRN [id number]  PCP:  Jesse Sizer, MD   Chief Complaint  Patient presents with  . OTHER    OD 12 month f/u. c/o occassional sob Meds reviewed verbally with pt.    HPI:  Jesse Lawrence is a pleasant 59 year old gentleman with history of CAD, stent x 2   2002 and 2003,  ISR in 2007, TPA (cath 3 days later) COPD, long smoking history, stopped 3 years ago HTN DM II, HBA1C 8.3, previously greater than 9 Who presents for f/u of his coronary disease,  chest pain symptoms  Reports he is not interested in vaccination His family is having to get vaccinated for work  Lab work reviewed HBA1C 6.6 Total chol 119, LDL 57  Stress at home, recent loss of several family members  Sleeps 5 hrs a day Goes to bed 11 PM, gets up 4 AM Tired in the day No regular exercise program  Does not smoke, does dipping  Works in Museum/gallery conservator, works with Psychologist, clinical statin with Zetia  EKG personally reviewed by myself on todays visit  shows Normal sinus rhythm rate 103 bpm no significant ST or T-wave changes Rare PVC Reports heart rate elevated as he had energy drink today   PMH:   has a past medical history of Allergy, Anxiety, COPD (chronic obstructive pulmonary disease) ([place]), Diabetes mellitus without complication ([place]), Hyperlipidemia, Hypertension, Male impotence, MI (myocardial infarction) ([place]), Obesity, and PNA (pneumonia).  PSH:    Past Surgical History:  Procedure Laterality Date  . cadiac stenting    . CYSTECTOMY     59 years of age  . KNEE ARTHROSCOPY      Current Outpatient Medications  Medication Sig Dispense Refill  . atorvastatin (LIPITOR) 80 MG tablet Take 1 tablet (80 mg total) by mouth daily at 6 PM. 90 tablet 1  . clopidogrel (PLAVIX) 75 MG tablet Take 1 tablet (75 mg total) by mouth daily. 90 tablet 1  . Dapagliflozin-metFORMIN HCl ER (XIGDUO XR) 08-998 MG TB24 Take 1 tablet by mouth 2  (two) times daily. 180 tablet 1  . ezetimibe (ZETIA) 10 MG tablet Take 1 tablet (10 mg total) by mouth daily. 90 tablet 1  . fluticasone (FLONASE) 50 MCG/ACT nasal spray Place 2 sprays into both nostrils as needed. 48 g 1  . icosapent Ethyl (VASCEPA) 1 g capsule TAKE 2 CAPSULES BY MOUTH 2 (TWO) TIMES DAILY. 360 capsule 1  . Insulin Degludec-Liraglutide (XULTOPHY) 100-3.6 UNIT-MG/ML SOPN Inject 50 Units into the skin daily. 45 mL 0  . Insulin Pen Needle (BD PEN NEEDLE NANO U/F) 32G X 4 MM MISC USE AS DIRECTED. E11.29 100 each 12  . lisinopril (ZESTRIL) 2.5 MG tablet Take 1 tablet (2.5 mg total) by mouth daily. 90 tablet 1  . loratadine (CLARITIN) 10 MG tablet Take 1 tablet by mouth daily.    . metaxalone (SKELAXIN) 800 MG tablet Take 1 tablet (800 mg total) by mouth 3 (three) times daily. 90 tablet 0  . metoprolol succinate (TOPROL-XL) 25 MG 24 hr tablet Take 0.5 tablets (12.5 mg total) by mouth daily. 45 tablet 1  . nortriptyline (PAMELOR) 25 MG capsule TAKE 1 CAPSULE (25 MG TOTAL) BY MOUTH AT BEDTIME. 90 capsule 1  . ONETOUCH ULTRA test strip USE AS DIRECTED 100 strip 11  . sildenafil (VIAGRA) 100 MG tablet Take 0.5-1 tablets (50-100 mg total) by mouth daily as needed for erectile  dysfunction. 30 tablet 0  . famotidine (PEPCID) 20 MG tablet Take 20 mg by mouth 2 (two) times daily.     No current facility-administered medications for this visit.     Allergies:   Patient has no known allergies.   Social History:  The patient  reports that he quit smoking about 6 years ago. His smoking use included cigarettes. He has a 45.00 pack-year smoking history. He uses smokeless tobacco. He reports that he does not drink alcohol and does not use drugs.   Family History:   family history includes CAD in his mother; Diabetes in his mother; Heart attack in his father and mother; Heart disease in his father and mother.    Review of Systems: Review of Systems  Constitutional: Negative.   HENT: Negative.    Respiratory: Negative.   Cardiovascular: Negative.   Gastrointestinal: Negative.   Musculoskeletal: Negative.   Neurological: Negative.   Psychiatric/Behavioral: Negative.   All other systems reviewed and are negative.   PHYSICAL EXAM: VS:  BP 138/78 (BP Location: Left Arm, Patient Position: Sitting, Cuff Size: Normal)   Pulse (!) 103   Ht 5' 8"  (1.727 m)   Wt 220 lb 6 oz (100 kg)   SpO2 98%   BMI 33.51 kg/m  , BMI Body mass index is 33.51 kg/m. Constitutional:  oriented to person, place, and time. No distress.  HENT:  Head: Normocephalic and atraumatic.  Eyes:  no discharge. No scleral icterus.  Neck: Normal range of motion. Neck supple. No JVD present.  Cardiovascular: Normal rate, regular rhythm, normal heart sounds and intact distal pulses. Exam reveals no gallop and no friction rub. No edema No murmur heard. Pulmonary/Chest: Effort normal and breath sounds normal. No stridor. No respiratory distress.  no wheezes.  no rales.  no tenderness.  Abdominal: Soft.  no distension.  no tenderness.  Musculoskeletal: Normal range of motion.  no  tenderness or deformity.  Neurological:  normal muscle tone. Coordination normal. No atrophy Skin: Skin is warm and dry. No rash noted. not diaphoretic.  Psychiatric:  normal mood and affect. behavior is normal. Thought content normal.    Recent Labs: 06/17/2019: ALT 25; BUN 20; Creat 0.74; Hemoglobin 16.7; Platelets 286; Potassium 4.4; Sodium 136    Lipid Panel Lab Results  Component Value Date   CHOL 119 06/17/2019   HDL 30 (L) 06/17/2019   LDLCALC 57 06/17/2019   TRIG 276 (H) 06/17/2019      Wt Readings from Last 3 Encounters:  01/09/20 220 lb 6 oz (100 kg)  12/09/19 214 lb (97.1 kg)  09/02/19 220 lb 9.6 oz (100.1 kg)       ASSESSMENT AND PLAN:  Coronary artery disease of native artery of native heart with stable angina pectoris (HCC)  Currently with no symptoms of angina. No further workup at this time. Continue  current medication regimen. Stable  History of coronary artery stent placement - 2 stents placed with history of in-stent restenosis Maintained on Plavix Denies anginal symptoms  Hypercholesteremia Continue statin with Zetia, goal LDL less than 70  Benign hypertension Reports having low blood pressure, metoprolol and lisinopril weaned down per his account  Hypertriglyceridemia Weight trending upwards, recommended strict diet, walking program  OSA (obstructive sleep apnea) Does not wear CPAP, sleeps 5 hours a night Fatigue in the day  Urine hesitancy Suggested he talk with primary care, may need Flomax  Tobacco abuse Quit smoking, uses dip  Sinus tachycardia Heart rate elevated which he attributes to  drinking energy drink on his way in Does not want to increase his metoprolol dosing, reports he has periodic low blood pressure   Total encounter time more than 25 minutes  Greater than 50% was spent in counseling and coordination of care with the patient   Orders Placed This Encounter  Procedures  . EKG 12-Lead     Signed, Esmond Plants, M.D., Ph.D. 01/09/2020  [place], [place]

## 2020-01-09 ENCOUNTER — Other Ambulatory Visit: Payer: Self-pay

## 2020-01-09 ENCOUNTER — Other Ambulatory Visit: Payer: Self-pay | Admitting: Family Medicine

## 2020-01-09 ENCOUNTER — Ambulatory Visit (INDEPENDENT_AMBULATORY_CARE_PROVIDER_SITE_OTHER): Payer: Managed Care, Other (non HMO) | Admitting: Cardiovascular Disease

## 2020-01-09 ENCOUNTER — Encounter: Payer: Self-pay | Admitting: Cardiovascular Disease

## 2020-01-09 VITALS — BP 138/78 | HR 103 | Ht 68.0 in | Wt 220.4 lb

## 2020-01-09 DIAGNOSIS — I25118 Atherosclerotic heart disease of native coronary artery with other forms of angina pectoris: Secondary | ICD-10-CM | POA: Diagnosis not present

## 2020-01-09 DIAGNOSIS — E7800 Pure hypercholesterolemia, unspecified: Secondary | ICD-10-CM

## 2020-01-09 DIAGNOSIS — I10 Essential (primary) hypertension: Secondary | ICD-10-CM

## 2020-01-09 DIAGNOSIS — E781 Pure hyperglyceridemia: Secondary | ICD-10-CM

## 2020-01-09 DIAGNOSIS — G43009 Migraine without aura, not intractable, without status migrainosus: Secondary | ICD-10-CM

## 2020-01-09 NOTE — Patient Instructions (Signed)
Ask Dr. Ancil Boozer about flomax   Medication Instructions:  No changes  If you need a refill on your cardiac medications before your next appointment, please call your pharmacy.    Lab work: No new labs needed   If you have labs (blood work) drawn today and your tests are completely normal, you will receive your results only by: Marland Kitchen MyChart Message (if you have MyChart) OR . A paper copy in the mail If you have any lab test that is abnormal or we need to change your treatment, we will call you to review the results.   Testing/Procedures: No new testing needed   Follow-Up: At [facility], you and your health needs are our priority.  As part of our continuing mission to provide you with exceptional heart care, we have created designated Provider Care Teams.  These Care Teams include your primary Cardiologist (physician) and Advanced Practice Providers (APPs -  Physician Assistants and Nurse Practitioners) who all work together to provide you with the care you need, when you need it.  . You will need a follow up appointment in 12 months  . Providers on your designated Care Team:   . Murray Hodgkins, NP . Christell Faith, PA-C . Marrianne Mood, PA-C  Any Other Special Instructions Will Be Listed Below (If Applicable).  COVID-19 Vaccine Information can be found at: [email or web address].uk For questions related to vaccine distribution or appointments, please email [email or web address] or call [phone].

## 2020-01-11 ENCOUNTER — Other Ambulatory Visit: Payer: Self-pay | Admitting: Family Medicine

## 2020-01-11 DIAGNOSIS — R252 Cramp and spasm: Secondary | ICD-10-CM

## 2020-01-11 NOTE — Telephone Encounter (Signed)
Requested medication (s) are due for refill today:yes  Requested medication (s) are on the active medication list: yes  Last refill: 12/09/19  #90  0 refills  Future visit scheduled yes 03/16/20  Notes to clinic: not delegated  Requested Prescriptions  Pending Prescriptions Disp Refills   metaxalone (SKELAXIN) 800 MG tablet [Pharmacy Med Name: METAXALONE 800 MG TABLET] 90 tablet 0    Sig: TAKE 1 TABLET BY MOUTH THREE TIMES A DAY      Not Delegated - Analgesics:  Muscle Relaxants Failed - 01/11/2020  4:17 PM      Failed - This refill cannot be delegated      Passed - Valid encounter within last 6 months    Recent Outpatient Visits           1 month ago Type 2 diabetes mellitus with diabetic nephropathy, without long-term current use of insulin [facility])   [facility] [place], Drue Stager, MD   4 months ago Type 2 diabetes mellitus with diabetic nephropathy, without long-term current use of insulin [facility])   [facility] [place], Drue Stager, MD   7 months ago Type 2 diabetes mellitus with diabetic nephropathy, without long-term current use of insulin [facility])   [facility] [place], Drue Stager, MD   10 months ago Type 2 diabetes mellitus with diabetic nephropathy, without long-term current use of insulin [facility])   [facility] [place], Drue Stager, MD   1 year ago Type 2 diabetes mellitus with diabetic nephropathy, without long-term current use of insulin [facility])   [facility] [place] Sizer, MD       Future Appointments             In 2 months Ancil Boozer, Drue Stager, MD [facility]

## 2020-01-11 NOTE — Telephone Encounter (Signed)
I tried to contact patient to see if he is still taking Baclofen. According to PCP he said he was no longer taking this. Please confirm if he calls back.

## 2020-02-04 ENCOUNTER — Other Ambulatory Visit: Payer: Self-pay | Admitting: Family Medicine

## 2020-02-04 DIAGNOSIS — K219 Gastro-esophageal reflux disease without esophagitis: Secondary | ICD-10-CM

## 2020-02-04 NOTE — Telephone Encounter (Signed)
Requested medication (s) are due for refill today: -  Requested medication (s) are on the active medication list: no  Last refill:  01/09/20   Future visit scheduled: yes  Notes to clinic:  historical med and provider   Requested Prescriptions  Pending Prescriptions Disp Refills   famotidine (PEPCID) 20 MG tablet [Pharmacy Med Name: FAMOTIDINE 20 MG TABLET] 60 tablet 5    Sig: TAKE 1 TABLET BY MOUTH TWICE A DAY      Gastroenterology:  H2 Antagonists Passed - 02/04/2020  8:49 AM      Passed - Valid encounter within last 12 months    Recent Outpatient Visits           1 month ago Type 2 diabetes mellitus with diabetic nephropathy, without long-term current use of insulin ([place])   [facility] [place], Drue Stager, MD   5 months ago Type 2 diabetes mellitus with diabetic nephropathy, without long-term current use of insulin [facility])   [facility] [place], Drue Stager, MD   8 months ago Type 2 diabetes mellitus with diabetic nephropathy, without long-term current use of insulin [facility])   [facility] [place], Drue Stager, MD   11 months ago Type 2 diabetes mellitus with diabetic nephropathy, without long-term current use of insulin [facility])   [facility] [place], Drue Stager, MD   1 year ago Type 2 diabetes mellitus with diabetic nephropathy, without long-term current use of insulin [facility])   [facility] Steele Sizer, MD       Future Appointments             In 1 month Ancil Boozer, Drue Stager, MD [facility], [facility]

## 2020-02-06 ENCOUNTER — Other Ambulatory Visit: Payer: Self-pay

## 2020-02-13 ENCOUNTER — Ambulatory Visit: Payer: 59 | Admitting: Gastroenterology

## 2020-02-13 ENCOUNTER — Other Ambulatory Visit: Payer: Self-pay

## 2020-02-13 ENCOUNTER — Telehealth: Payer: Self-pay

## 2020-02-13 NOTE — Telephone Encounter (Signed)
Returned patients call. Pt requested to cancel his appointment at this time.

## 2020-02-23 ENCOUNTER — Encounter: Payer: Self-pay | Admitting: Gastroenterology

## 2020-02-24 ENCOUNTER — Telehealth: Payer: Self-pay

## 2020-02-24 NOTE — Telephone Encounter (Signed)
Copied from [place] [id number]. Topic: Quick Communication - Rx Refill/Question >> Feb 24, 2020  3:40 PM Erick Blinks wrote: Pt is requesting a call back from the office regarding samples for his medications. Pt does not have insurance until mid January and needs either samples or coupons for all of his Rxs.  Best contact: Pt [phone]

## 2020-02-27 ENCOUNTER — Telehealth: Payer: Self-pay | Admitting: Family Medicine

## 2020-02-27 NOTE — Telephone Encounter (Signed)
Pt calling stating that he no longer has insurance. Pt states that he has not been able to use his medication discount card on [place] and is requesting to know if this medications can be changed to something cheaper. Please advise.      [place]  [place] [address]  Phone: [phone] Fax: [phone]  Hours: Not open 24 hours

## 2020-02-27 NOTE — Telephone Encounter (Signed)
Called and left Darrold Span a call at [phone] for Merleen Nicely and lft message for Aaron Edelman for samples of Xultophy for pt to do him till the first of the year.

## 2020-02-28 ENCOUNTER — Other Ambulatory Visit: Payer: Self-pay

## 2020-02-28 NOTE — Telephone Encounter (Signed)
Hey spoke with Aaron Edelman that has the [place] and he says that they do not have that anymore that they are now giving the patient Rhelibus instead. Says it works better. I have tried calling Juliann Pulse (yesterday and today and left message to call us) that has the [place] and she must be on vacation due to Thanksgiving.Will keep trying.

## 2020-02-29 MED ORDER — PIOGLITAZONE HCL 15 MG PO TABS: 15.0000 mg | ORAL_TABLET | Freq: Every day | ORAL | 0 refills | Status: DC

## 2020-03-04 ENCOUNTER — Other Ambulatory Visit: Payer: Self-pay | Admitting: Family Medicine

## 2020-03-04 DIAGNOSIS — I25118 Atherosclerotic heart disease of native coronary artery with other forms of angina pectoris: Secondary | ICD-10-CM

## 2020-03-06 ENCOUNTER — Other Ambulatory Visit: Payer: Self-pay | Admitting: Family Medicine

## 2020-03-06 ENCOUNTER — Telehealth: Payer: Self-pay

## 2020-03-06 DIAGNOSIS — I25118 Atherosclerotic heart disease of native coronary artery with other forms of angina pectoris: Secondary | ICD-10-CM

## 2020-03-06 NOTE — Telephone Encounter (Signed)
The patient is not going to take Rhelibus. He will be getting samples of xigduo and [place]. They are ready for him to pick up. Per Katharine Look the nurse she has notified him of this.

## 2020-03-06 NOTE — Telephone Encounter (Signed)
Copied from [place] [id number]. Topic: General - Other >> Mar 06, 2020  8:29 AM Keene Breath wrote: Reason for CRM: Patient called to ask if the office had any samples of medication for him.  He stated that the office would know the name of the medication because he didn't know.  Please advise and call patient if available.

## 2020-03-06 NOTE — Telephone Encounter (Signed)
Pt needs refills on Plavix. Said had refills but pharm said that date had ran out to refill. Pharm is [address]

## 2020-03-06 NOTE — Telephone Encounter (Signed)
Medication Refill - Medication: clopidogrel (PLAVIX) 75 MG tablet    Preferred Pharmacy (with phone number or street name):  [place], [place] Phone:  [phone]  Fax:  [phone]       Agent: Please be advised that RX refills may take up to 3 business days. We ask that you follow-up with your pharmacy.

## 2020-03-13 ENCOUNTER — Ambulatory Visit: Payer: Managed Care, Other (non HMO) | Admitting: Family Medicine

## 2020-03-15 MED ORDER — CLOPIDOGREL BISULFATE 75 MG PO TABS: 75.0000 mg | ORAL_TABLET | Freq: Every day | ORAL | 0 refills | Status: DC

## 2020-03-15 NOTE — Telephone Encounter (Signed)
Phone call to pt.  Advised that last Rx for Clopidogrel on 12/09/19 [facility] [place], and was given #90 with RF x 1.  Pt. Stated [facility] on [address].  Advised pt. That Rx for Clopidogrel will be sent through to the requested pharmacy.  Agreed with plan.

## 2020-03-15 NOTE — Telephone Encounter (Signed)
Pt called in to follow up on refill request for medication Plavix, pt says that he has requested a few weeks ago and hasn't heard anything.   Please assist.

## 2020-03-15 NOTE — Telephone Encounter (Signed)
Requested Prescriptions  Pending Prescriptions Disp Refills  . clopidogrel (PLAVIX) 75 MG tablet 90 tablet 0    Sig: Take 1 tablet (75 mg total) by mouth daily.     Hematology: Antiplatelets - clopidogrel Failed - 03/15/2020  9:50 AM      Failed - Evaluate AST, ALT within 2 months of therapy initiation.      Failed - HCT in normal range and within 180 days    HCT  Date Value Ref Range Status  06/17/2019 50.7 (H) 38.5 - 50.0 % Final         Failed - HGB in normal range and within 180 days    Hemoglobin  Date Value Ref Range Status  06/17/2019 16.7 13.2 - 17.1 g/dL Final         Failed - PLT in normal range and within 180 days    Platelets  Date Value Ref Range Status  06/17/2019 286 140 - 400 Thousand/uL Final         Passed - ALT in normal range and within 360 days    ALT  Date Value Ref Range Status  06/17/2019 25 9 - 46 U/L Final   SGPT (ALT)  Date Value Ref Range Status  08/02/2011 84 (H) U/L Final    Comment:    12-78 NOTE: NEW REFERENCE RANGE 02/28/2011          Passed - AST in normal range and within 360 days    AST  Date Value Ref Range Status  06/17/2019 15 10 - 35 U/L Final   SGOT(AST)  Date Value Ref Range Status  08/02/2011 42 (H) 15 - 31 Unit/L Final         Passed - Valid encounter within last 6 months    Recent Outpatient Visits          3 months ago Type 2 diabetes mellitus with diabetic nephropathy, without long-term current use of insulin [facility]   [facility] [place], Drue Stager, MD   6 months ago Type 2 diabetes mellitus with diabetic nephropathy, without long-term current use of insulin [facility])   [facility] [place], Drue Stager, MD   9 months ago Type 2 diabetes mellitus with diabetic nephropathy, without long-term current use of insulin [facility])   [facility] [place], Drue Stager, MD   1 year ago Type 2 diabetes mellitus with diabetic nephropathy, without long-term current use of insulin [facility])    [facility] [place], Drue Stager, MD   1 year ago Type 2 diabetes mellitus with diabetic nephropathy, without long-term current use of insulin [facility])   [facility] Steele Sizer, MD      Future Appointments            In 2 months Ancil Boozer, Drue Stager, MD [facility], [facility]

## 2020-03-15 NOTE — Addendum Note (Signed)
Addended by: Denman George on: 03/15/2020 09:50 AM   Modules accepted: Orders

## 2020-03-16 ENCOUNTER — Ambulatory Visit: Payer: Managed Care, Other (non HMO) | Admitting: Family Medicine

## 2020-03-20 ENCOUNTER — Other Ambulatory Visit: Payer: Self-pay | Admitting: Family Medicine

## 2020-03-20 DIAGNOSIS — E1121 Type 2 diabetes mellitus with diabetic nephropathy: Secondary | ICD-10-CM

## 2020-03-20 MED ORDER — XIGDUO XR 10-1000 MG PO TB24: 1.0000 | ORAL_TABLET | Freq: Every day | ORAL | 0 refills | Status: DC

## 2020-03-20 MED ORDER — SOLIQUA 100-33 UNT-MCG/ML ~~LOC~~ SOPN: 50.0000 [IU] | PEN_INJECTOR | Freq: Every day | SUBCUTANEOUS | 0 refills | Status: DC

## 2020-04-06 ENCOUNTER — Other Ambulatory Visit: Payer: Self-pay | Admitting: Family Medicine

## 2020-04-06 DIAGNOSIS — N529 Male erectile dysfunction, unspecified: Secondary | ICD-10-CM

## 2020-05-09 ENCOUNTER — Other Ambulatory Visit: Payer: Self-pay | Admitting: Family Medicine

## 2020-06-11 ENCOUNTER — Ambulatory Visit: Payer: Managed Care, Other (non HMO) | Admitting: Family Medicine

## 2020-06-14 ENCOUNTER — Other Ambulatory Visit: Payer: Self-pay | Admitting: Family Medicine

## 2020-06-16 ENCOUNTER — Other Ambulatory Visit: Payer: Self-pay | Admitting: Family Medicine

## 2020-06-16 DIAGNOSIS — E1169 Type 2 diabetes mellitus with other specified complication: Secondary | ICD-10-CM

## 2020-06-16 DIAGNOSIS — E1121 Type 2 diabetes mellitus with diabetic nephropathy: Secondary | ICD-10-CM

## 2020-06-16 DIAGNOSIS — I25118 Atherosclerotic heart disease of native coronary artery with other forms of angina pectoris: Secondary | ICD-10-CM

## 2020-06-16 DIAGNOSIS — E785 Hyperlipidemia, unspecified: Secondary | ICD-10-CM

## 2020-06-16 DIAGNOSIS — N529 Male erectile dysfunction, unspecified: Secondary | ICD-10-CM

## 2020-06-16 NOTE — Telephone Encounter (Signed)
Requested Prescriptions  Pending Prescriptions Disp Refills  . XULTOPHY 100-3.6 UNIT-MG/ML SOPN [Pharmacy Med Name: XULTOPHY 100 UNIT-3.6MG/ML PEN]      Sig: INJECT 50 UNITS INTO THE SKIN DAILY.     Endocrinology:  Diabetes - Insulin + GLP-1 Receptor Agonist Combos 1 Failed - 06/16/2020 10:41 AM      Failed - HBA1C is between 0 and 7.9 and within 180 days    Hemoglobin A1C  Date Value Ref Range Status  12/09/2019 6.6 (A) 4.0 - 5.6 % Final   HbA1c, POC (controlled diabetic range)  Date Value Ref Range Status  11/26/2018 5.7 0.0 - 7.0 % Final         Failed - Valid encounter within last 6 months    Recent Outpatient Visits          6 months ago Type 2 diabetes mellitus with diabetic nephropathy, without long-term current use of insulin [facility])   [facility] [place], Drue Stager, MD   9 months ago Type 2 diabetes mellitus with diabetic nephropathy, without long-term current use of insulin [facility])   [facility] [place], Drue Stager, MD   1 year ago Type 2 diabetes mellitus with diabetic nephropathy, without long-term current use of insulin [facility])   [facility] [place], Drue Stager, MD   1 year ago Type 2 diabetes mellitus with diabetic nephropathy, without long-term current use of insulin [facility])   [facility] [place], Drue Stager, MD   1 year ago Type 2 diabetes mellitus with diabetic nephropathy, without long-term current use of insulin [facility])   [facility] Steele Sizer, MD      Future Appointments            In 1 week Steele Sizer, MD [facility], [place]           . BD PEN NEEDLE NANO 2ND GEN 32G X 4 MM MISC [Pharmacy Med Name: BD NANO 2 GEN PEN NDL 32GX4MM] 100 each 12    Sig: USE AS DIRECTED. E11.29     Endocrinology: Diabetes - Testing Supplies Passed - 06/16/2020 10:41 AM      Passed - Valid encounter within last 12 months    Recent Outpatient Visits          6 months ago  Type 2 diabetes mellitus with diabetic nephropathy, without long-term current use of insulin [facility])   [facility] [place], Drue Stager, MD   9 months ago Type 2 diabetes mellitus with diabetic nephropathy, without long-term current use of insulin [facility])   [facility] [place], Drue Stager, MD   1 year ago Type 2 diabetes mellitus with diabetic nephropathy, without long-term current use of insulin [facility])   [facility] [place], Drue Stager, MD   1 year ago Type 2 diabetes mellitus with diabetic nephropathy, without long-term current use of insulin [facility])   [facility] [place], Drue Stager, MD   1 year ago Type 2 diabetes mellitus with diabetic nephropathy, without long-term current use of insulin [facility])   [facility] Steele Sizer, MD      Future Appointments            In 1 week Steele Sizer, MD [facility], [facility]

## 2020-06-16 NOTE — Telephone Encounter (Signed)
Requested Prescriptions  Pending Prescriptions Disp Refills  . sildenafil (VIAGRA) 100 MG tablet [Pharmacy Med Name: SILDENAFIL 100 MG TABLET] 30 tablet 0    Sig: TAKE 1/2 TO 1 TABLET BY MOUTH DAILY AS NEEDED FOR ERECTILE DYSFUNCTION     Urology: Erectile Dysfunction Agents Passed - 06/16/2020 10:15 AM      Passed - Last BP in normal range    BP Readings from Last 1 Encounters:  01/09/20 138/78         Passed - Valid encounter within last 12 months    Recent Outpatient Visits          6 months ago Type 2 diabetes mellitus with diabetic nephropathy, without long-term current use of insulin [facility])   [facility] [place], Drue Stager, MD   9 months ago Type 2 diabetes mellitus with diabetic nephropathy, without long-term current use of insulin [facility])   [facility] [place], Drue Stager, MD   1 year ago Type 2 diabetes mellitus with diabetic nephropathy, without long-term current use of insulin [facility])   [facility] [place], Drue Stager, MD   1 year ago Type 2 diabetes mellitus with diabetic nephropathy, without long-term current use of insulin [facility] [place])   [facility] [place], Drue Stager, MD   1 year ago Type 2 diabetes mellitus with diabetic nephropathy, without long-term current use of insulin [facility])   [facility] Steele Sizer, MD      Future Appointments            In 1 week Steele Sizer, MD [facility], [place]           . clopidogrel (PLAVIX) 75 MG tablet [Pharmacy Med Name: CLOPIDOGREL 75 MG TABLET] 90 tablet     Sig: TAKE ONE TABLET BY MOUTH DAILY     Hematology: Antiplatelets - clopidogrel Failed - 06/16/2020 10:15 AM      Failed - Evaluate AST, ALT within 2 months of therapy initiation.      Failed - ALT in normal range and within 360 days    ALT  Date Value Ref Range Status  06/17/2019 25 9 - 46 U/L Final   SGPT (ALT)  Date Value Ref Range Status  08/02/2011 84 (H) U/L  Final    Comment:    12-78 NOTE: NEW REFERENCE RANGE 02/28/2011          Failed - AST in normal range and within 360 days    AST  Date Value Ref Range Status  06/17/2019 15 10 - 35 U/L Final   SGOT(AST)  Date Value Ref Range Status  08/02/2011 42 (H) 15 - 37 Unit/L Final         Failed - HCT in normal range and within 180 days    HCT  Date Value Ref Range Status  06/17/2019 50.7 (H) 38.5 - 50.0 % Final         Failed - HGB in normal range and within 180 days    Hemoglobin  Date Value Ref Range Status  06/17/2019 16.7 13.2 - 17.1 g/dL Final         Failed - PLT in normal range and within 180 days    Platelets  Date Value Ref Range Status  06/17/2019 286 140 - 400 Thousand/uL Final         Failed - Valid encounter within last 6 months    Recent Outpatient Visits          6  months ago Type 2 diabetes mellitus with diabetic nephropathy, without long-term current use of insulin [facility])   [facility] [place], Drue Stager, MD   9 months ago Type 2 diabetes mellitus with diabetic nephropathy, without long-term current use of insulin [facility])   [facility] [place], Drue Stager, MD   1 year ago Type 2 diabetes mellitus with diabetic nephropathy, without long-term current use of insulin [facility])   [facility] [place], Drue Stager, MD   1 year ago Type 2 diabetes mellitus with diabetic nephropathy, without long-term current use of insulin [facility])   [facility] [place], Drue Stager, MD   1 year ago Type 2 diabetes mellitus with diabetic nephropathy, without long-term current use of insulin [facility])   [facility] Steele Sizer, MD      Future Appointments            In 1 week Steele Sizer, MD [facility], [facility]

## 2020-06-16 NOTE — Telephone Encounter (Signed)
Requested medication (s) are due for refill today: yes  Requested medication (s) are on the active medication list: yes  Last refill:  03/15/20  Future visit scheduled: yes  Notes to clinic:  overdue lab work   Requested Prescriptions  Pending Prescriptions Disp Refills   clopidogrel (PLAVIX) 75 MG tablet [Pharmacy Med Name: CLOPIDOGREL 75 MG TABLET] 90 tablet     Sig: TAKE ONE TABLET BY MOUTH DAILY      Hematology: Antiplatelets - clopidogrel Failed - 06/16/2020 10:15 AM      Failed - Evaluate AST, ALT within 2 months of therapy initiation.      Failed - ALT in normal range and within 360 days    ALT  Date Value Ref Range Status  06/17/2019 25 9 - 46 U/L Final   SGPT (ALT)  Date Value Ref Range Status  08/02/2011 84 (H) U/L Final    Comment:    12-78 NOTE: NEW REFERENCE RANGE 02/28/2011           Failed - AST in normal range and within 360 days    AST  Date Value Ref Range Status  06/17/2019 15 10 - 35 U/L Final   SGOT(AST)  Date Value Ref Range Status  08/02/2011 42 (H) 15 - 37 Unit/L Final          Failed - HCT in normal range and within 180 days    HCT  Date Value Ref Range Status  06/17/2019 50.7 (H) 38.5 - 50.0 % Final          Failed - HGB in normal range and within 180 days    Hemoglobin  Date Value Ref Range Status  06/17/2019 16.7 13.2 - 17.1 g/dL Final          Failed - PLT in normal range and within 180 days    Platelets  Date Value Ref Range Status  06/17/2019 286 140 - 400 Thousand/uL Final          Failed - Valid encounter within last 6 months    Recent Outpatient Visits           6 months ago Type 2 diabetes mellitus with diabetic nephropathy, without long-term current use of insulin [facility])   [facility] [place], Drue Stager, MD   9 months ago Type 2 diabetes mellitus with diabetic nephropathy, without long-term current use of insulin [facility])   [facility] [place], Drue Stager, MD   1 year ago Type 2  diabetes mellitus with diabetic nephropathy, without long-term current use of insulin [facility])   [facility] [place], Drue Stager, MD   1 year ago Type 2 diabetes mellitus with diabetic nephropathy, without long-term current use of insulin [facility])   [facility] [place], Drue Stager, MD   1 year ago Type 2 diabetes mellitus with diabetic nephropathy, without long-term current use of insulin [facility])   [facility] Steele Sizer, MD       Future Appointments             In 1 week Steele Sizer, MD [facility]              Signed Prescriptions Disp Refills   sildenafil (VIAGRA) 100 MG tablet 30 tablet 0    Sig: TAKE 1/2 TO 1 TABLET BY MOUTH DAILY AS NEEDED FOR ERECTILE DYSFUNCTION      Urology: Erectile Dysfunction Agents Passed - 06/16/2020 10:15 AM  Passed - Last BP in normal range    BP Readings from Last 1 Encounters:  01/09/20 138/78          Passed - Valid encounter within last 12 months    Recent Outpatient Visits           6 months ago Type 2 diabetes mellitus with diabetic nephropathy, without long-term current use of insulin [facility])   [facility] [place], Drue Stager, MD   9 months ago Type 2 diabetes mellitus with diabetic nephropathy, without long-term current use of insulin [facility])   [facility] [place], Drue Stager, MD   1 year ago Type 2 diabetes mellitus with diabetic nephropathy, without long-term current use of insulin [facility])   [facility] [place], Drue Stager, MD   1 year ago Type 2 diabetes mellitus with diabetic nephropathy, without long-term current use of insulin [facility])   [facility] [place], Drue Stager, MD   1 year ago Type 2 diabetes mellitus with diabetic nephropathy, without long-term current use of insulin [facility])   [facility] Steele Sizer, MD       Future Appointments             In 1 week  Steele Sizer, MD [facility], [facility]

## 2020-06-25 NOTE — Progress Notes (Signed)
Name: Jesse Lawrence.   MRN: [id number]    DOB: [birth year]   Date:06/26/2020       Progress Note  Subjective  Chief Complaint  Follow Up  HPI  DM II : His A1C had been at goal for a long time, however had a gap of insurance and medications, today his A1C is up to 10.5%, he states he just got his insurance back, and resumed  Xigduo 08/998 two daily, we gave him some samples of [place] however he ran out weeks ago and would like to go back to [place]. Explained since glucose is so high we will also have to give him some Novolog to take before largest meal of the day. He states his diet is "not bad". Glucose at home has been 180-190's, post-prandially as high as 500's. He lost 10 lbs. He needs labs and urine micro, but he will return prior to next visit when he is back on his medications.    HTN/CAD:no palpitation, denies chest pain described as tightness ( having heart burn and dysphagia) , no longer having dizziness, on low dose ACE and half pill metoprolol , he has not been taking Zetia or Vascepa, not sure about Atorvastatin. Marland KitchenHe sees cardiologist - Dr. Rockey Situ   ED: he has difficulty maintaining and erection,he used to take Cialis, now on viagraand denies side effects.He does not need a refill today   OSA: he has not been compliant with CPAP machine, he cannot tolerated it, he is aware of the importance of wearing it   Migraine headache: he states he has a history of migraines, used to go to [facility] when severe and has seen a headache specialist about 20 years ago.He stopped using Nortriptyline months ago and migraine is stable at above 2 times per month   GERD: he states symptoms have improved, we will go back to pantoprazole once a day and pepcid prn at night.   Numbness on his finger tips: he works as a Dealer, sometimes shooting pain down his hands. It does not bother him at work, usually notices more when at rest.   Patient Active Problem List   Diagnosis Date Noted  .  Coronary artery disease of native artery of native heart with stable angina pectoris ([place]) 11/26/2018  . OSA (obstructive sleep apnea) 05/21/2015  . Perennial allergic rhinitis 11/07/2014  . Arteriosclerosis of coronary artery 11/07/2014  . CD (contact dermatitis) 11/07/2014  . Decreased libido 11/07/2014  . Diabetes mellitus with renal manifestation ([place]) 11/07/2014  . Gastro-esophageal reflux disease without esophagitis 11/07/2014  . H/O acute myocardial infarction 11/07/2014  . Hypercholesteremia 11/07/2014  . Benign hypertension 11/07/2014  . Hypertriglyceridemia 11/07/2014  . H/O high risk medication treatment 11/07/2014  . NASH (nonalcoholic steatohepatitis) 11/07/2014  . Microalbuminuria 11/07/2014  . Adult BMI 30+ 11/07/2014  . Fungal infection of toenail 11/07/2014  . Tobacco abuse 11/07/2014  . ED (erectile dysfunction) of organic origin 09/29/2006    Past Surgical History:  Procedure Laterality Date  . cadiac stenting    . CYSTECTOMY     60 years of age  . KNEE ARTHROSCOPY      Family History  Problem Relation Age of Onset  . Diabetes Mother   . CAD Mother   . Heart disease Mother   . Heart attack Mother   . Heart disease Father   . Heart attack Father     Social History   Tobacco Use  . Smoking status: Former Smoker    Packs/day:  1.50    Years: 30.00    Pack years: 45.00    Types: Cigarettes    Quit date: 04/19/2013    Years since quitting: 7.1  . Smokeless tobacco: Current User  Substance Use Topics  . Alcohol use: No    Alcohol/week: 0.0 standard drinks    Comment: occ     Current Outpatient Medications:  .  atorvastatin (LIPITOR) 80 MG tablet, Take 1 tablet (80 mg total) by mouth daily at 6 PM., Disp: 90 tablet, Rfl: 1 .  BD PEN NEEDLE NANO 2ND GEN 32G X 4 MM MISC, USE AS DIRECTED. E11.29, Disp: 100 each, Rfl: 12 .  clopidogrel (PLAVIX) 75 MG tablet, TAKE ONE TABLET BY MOUTH DAILY, Disp: 90 tablet, Rfl: 1 .  Dapagliflozin-metFORMIN HCl ER  (XIGDUO XR) 01-999 MG TB24, Take 1 tablet by mouth daily., Disp: 30 tablet, Rfl: 0 .  ezetimibe (ZETIA) 10 MG tablet, Take 1 tablet (10 mg total) by mouth daily., Disp: 90 tablet, Rfl: 1 .  famotidine (PEPCID) 20 MG tablet, Take 20 mg by mouth 2 (two) times daily., Disp: , Rfl:  .  fluticasone (FLONASE) 50 MCG/ACT nasal spray, Place 2 sprays into both nostrils as needed., Disp: 48 g, Rfl: 1 .  icosapent Ethyl (VASCEPA) 1 g capsule, TAKE 2 CAPSULES BY MOUTH 2 (TWO) TIMES DAILY., Disp: 360 capsule, Rfl: 1 .  Insulin Glargine-Lixisenatide (SOLIQUA) 100-33 UNT-MCG/ML SOPN, Inject 50 Units into the skin daily., Disp: 15 mL, Rfl: 0 .  lisinopril (ZESTRIL) 2.5 MG tablet, Take 1 tablet (2.5 mg total) by mouth daily., Disp: 90 tablet, Rfl: 1 .  loratadine (CLARITIN) 10 MG tablet, Take 1 tablet by mouth daily., Disp: , Rfl:  .  metaxalone (SKELAXIN) 800 MG tablet, TAKE 1 TABLET BY MOUTH THREE TIMES A DAY, Disp: 90 tablet, Rfl: 2 .  metoprolol succinate (TOPROL-XL) 25 MG 24 hr tablet, Take 0.5 tablets (12.5 mg total) by mouth daily., Disp: 45 tablet, Rfl: 1 .  nortriptyline (PAMELOR) 25 MG capsule, TAKE 1 CAPSULE (25 MG TOTAL) BY MOUTH AT BEDTIME., Disp: 90 capsule, Rfl: 1 .  ONETOUCH ULTRA test strip, USE AS DIRECTED, Disp: 100 strip, Rfl: 11 .  pantoprazole (PROTONIX) 40 MG tablet, TAKE ONE TABLET BY MOUTH TWICE A DAY, Disp: 60 tablet, Rfl: 0 .  sildenafil (VIAGRA) 100 MG tablet, TAKE 1/2 TO 1 TABLET BY MOUTH DAILY AS NEEDED FOR ERECTILE DYSFUNCTION, Disp: 30 tablet, Rfl: 0  No Known Allergies  I personally reviewed active problem list, medication list, allergies, family history, social history, health maintenance with the patient/caregiver today.   ROS  Constitutional: Negative for fever , positive for weight change.  Respiratory: Negative for cough and shortness of breath.   Cardiovascular: Negative for chest pain or palpitations.  Gastrointestinal: Negative for abdominal pain, no bowel changes.   Musculoskeletal: Negative for gait problem or joint swelling.  Skin: Negative for rash.  Neurological: Negative for dizziness or headache.  No other specific complaints in a complete review of systems (except as listed in HPI above).  Objective  Vitals:   06/26/20 0841  BP: 132/80  Pulse: 84  Resp: 16  Temp: 98.2 F (36.8 C)  TempSrc: Oral  SpO2: 98%  Weight: 210 lb (95.3 kg)  Height: 5' 8"  (1.727 m)    Body mass index is 31.93 kg/m.  Physical Exam  Constitutional: Patient appears well-developed and well-nourished. Obese No distress.  HEENT: head atraumatic, normocephalic, pupils equal and reactive to light,  neck supple Cardiovascular:  Normal rate, regular rhythm and normal heart sounds.  No murmur heard. No BLE edema. Pulmonary/Chest: Effort normal and breath sounds normal. No respiratory distress. Abdominal: Soft.  There is no tenderness. Psychiatric: Patient has a normal mood and affect. behavior is normal. Judgment and thought content normal.  Recent Results (from the past 2160 hour(s))  POCT HgB A1C     Status: Abnormal   Collection Time: 06/26/20  8:41 AM  Result Value Ref Range   Hemoglobin A1C 10.5 (A) 4.0 - 5.6 %   HbA1c POC (<> result, manual entry)     HbA1c, POC (prediabetic range)     HbA1c, POC (controlled diabetic range)      PHQ2/9: Depression screen [facility] 2/9 06/26/2020 12/09/2019 09/02/2019 06/03/2019 02/25/2019  Decreased Interest 0 0 0 0 0  Down, Depressed, Hopeless 0 0 0 0 0  PHQ - 2 Score 0 0 0 0 0  Altered sleeping - 0 0 0 0  Tired, decreased energy - 0 0 0 0  Change in appetite - 0 0 0 0  Feeling bad or failure about yourself  - 0 0 0 0  Trouble concentrating - 0 0 0 0  Moving slowly or fidgety/restless - 0 0 0 0  Suicidal thoughts - 0 0 0 0  PHQ-9 Score - 0 0 0 0  Difficult doing work/chores - - - - -  Some recent data might be hidden    phq 9 is negative   Fall Risk: Fall Risk  06/26/2020 12/09/2019 09/02/2019 11/26/2018 11/09/2018  Falls  in the past year? 0 0 0 0 0  Number falls in past yr: 0 0 0 0 0  Injury with Fall? 0 0 0 0 0     Functional Status Survey: Is the patient deaf or have difficulty hearing?: No Does the patient have difficulty seeing, even when wearing glasses/contacts?: No Does the patient have difficulty concentrating, remembering, or making decisions?: No Does the patient have difficulty walking or climbing stairs?: No Does the patient have difficulty dressing or bathing?: No Does the patient have difficulty doing errands alone such as visiting a doctor's office or shopping?: No    Assessment & Plan  1. Type 2 diabetes mellitus with diabetic nephropathy, without long-term current use of insulin (HCC)  - POCT HgB A1C - lisinopril (ZESTRIL) 2.5 MG tablet; Take 1 tablet (2.5 mg total) by mouth daily.  Dispense: 90 tablet; Refill: 1 - XIGDUO XR 08-998 MG TB24; Take 1 tablet by mouth in the morning and at bedtime.  Dispense: 180 tablet; Refill: 1 - Insulin Pen Needle (BD PEN NEEDLE NANO 2ND GEN) 32G X 4 MM MISC; Inject 1 each into the skin in the morning and at bedtime.  Dispense: 200 each; Refill: 2 - insulin aspart (NOVOLOG FLEXPEN) 100 UNIT/ML FlexPen; Inject 2-12 Units into the skin 3 (three) times daily with meals. Pre-meal insulin :  Check fsbs prior to meals  Hold if below 110 If glucose 110- 140 give 4 units If glucose 140 to 180 give 6 units  If glucose 180 to 220  give 8 units If glucose above 220 give 10 units If glucose above 220 use 12 units  Dispense: 15 mL; Refill: 2 - Microalbumin / creatinine urine ratio  2. NASH (nonalcoholic steatohepatitis)   3. Gastro-esophageal reflux disease without esophagitis  - famotidine (PEPCID) 40 MG tablet; Take 1 tablet (40 mg total) by mouth at bedtime.  Dispense: 90 tablet; Refill: 1 - pantoprazole (PROTONIX) 40 MG  tablet; Take 1 tablet (40 mg total) by mouth in the morning.  Dispense: 90 tablet; Refill: 1  4. Hypercholesteremia  - ezetimibe  (ZETIA) 10 MG tablet; Take 1 tablet (10 mg total) by mouth daily.  Dispense: 90 tablet; Refill: 1 - atorvastatin (LIPITOR) 80 MG tablet; Take 1 tablet (80 mg total) by mouth daily at 6 PM.  Dispense: 90 tablet; Refill: 1  5. Dyslipidemia associated with type 2 diabetes mellitus (HCC)  - icosapent Ethyl (VASCEPA) 1 g capsule; TAKE 2 CAPSULES BY MOUTH 2 (TWO) TIMES DAILY.  Dispense: 360 capsule; Refill: 1 - Lipid panel  6. Benign hypertension  - lisinopril (ZESTRIL) 2.5 MG tablet; Take 1 tablet (2.5 mg total) by mouth daily.  Dispense: 90 tablet; Refill: 1 - metoprolol succinate (TOPROL-XL) 25 MG 24 hr tablet; Take 0.5 tablets (12.5 mg total) by mouth daily.  Dispense: 45 tablet; Refill: 1 - COMPLETE METABOLIC PANEL WITH GFR - CBC with Differential/Platelet  7. Coronary artery disease of native artery of native heart with stable angina pectoris (HCC)  - lisinopril (ZESTRIL) 2.5 MG tablet; Take 1 tablet (2.5 mg total) by mouth daily.  Dispense: 90 tablet; Refill: 1 - metoprolol succinate (TOPROL-XL) 25 MG 24 hr tablet; Take 0.5 tablets (12.5 mg total) by mouth daily.  Dispense: 45 tablet; Refill: 1  8. Arteriosclerosis of coronary artery   9. OSA (obstructive sleep apnea)   10. Hypertriglyceridemia  - icosapent Ethyl (VASCEPA) 1 g capsule; TAKE 2 CAPSULES BY MOUTH 2 (TWO) TIMES DAILY.  Dispense: 360 capsule; Refill: 1  11. Migraine without aura and without status migrainosus, not intractable   12. Paresthesia of both hands  - Vitamin B12

## 2020-06-26 ENCOUNTER — Other Ambulatory Visit: Payer: Self-pay

## 2020-06-26 ENCOUNTER — Encounter: Payer: Self-pay | Admitting: Family Medicine

## 2020-06-26 ENCOUNTER — Ambulatory Visit: Payer: Managed Care, Other (non HMO) | Admitting: Family Medicine

## 2020-06-26 VITALS — BP 132/80 | HR 84 | Temp 98.2°F | Resp 16 | Ht 68.0 in | Wt 210.0 lb

## 2020-06-26 DIAGNOSIS — K7581 Nonalcoholic steatohepatitis (NASH): Secondary | ICD-10-CM | POA: Diagnosis not present

## 2020-06-26 DIAGNOSIS — E1121 Type 2 diabetes mellitus with diabetic nephropathy: Secondary | ICD-10-CM | POA: Diagnosis not present

## 2020-06-26 DIAGNOSIS — R202 Paresthesia of skin: Secondary | ICD-10-CM

## 2020-06-26 DIAGNOSIS — E781 Pure hyperglyceridemia: Secondary | ICD-10-CM

## 2020-06-26 DIAGNOSIS — E7800 Pure hypercholesterolemia, unspecified: Secondary | ICD-10-CM

## 2020-06-26 DIAGNOSIS — I25118 Atherosclerotic heart disease of native coronary artery with other forms of angina pectoris: Secondary | ICD-10-CM

## 2020-06-26 DIAGNOSIS — E1169 Type 2 diabetes mellitus with other specified complication: Secondary | ICD-10-CM

## 2020-06-26 DIAGNOSIS — K219 Gastro-esophageal reflux disease without esophagitis: Secondary | ICD-10-CM | POA: Diagnosis not present

## 2020-06-26 DIAGNOSIS — I2510 Atherosclerotic heart disease of native coronary artery without angina pectoris: Secondary | ICD-10-CM

## 2020-06-26 DIAGNOSIS — I10 Essential (primary) hypertension: Secondary | ICD-10-CM

## 2020-06-26 DIAGNOSIS — G43009 Migraine without aura, not intractable, without status migrainosus: Secondary | ICD-10-CM

## 2020-06-26 DIAGNOSIS — G4733 Obstructive sleep apnea (adult) (pediatric): Secondary | ICD-10-CM

## 2020-06-26 DIAGNOSIS — E785 Hyperlipidemia, unspecified: Secondary | ICD-10-CM

## 2020-06-26 LAB — POCT GLYCOSYLATED HEMOGLOBIN (HGB A1C): Hemoglobin A1C: 10.5 % — AB (ref 4.0–5.6)

## 2020-06-26 MED ORDER — BD PEN NEEDLE NANO 2ND GEN 32G X 4 MM MISC: 1.0000 | Freq: Two times a day (BID) | 2 refills | Status: DC

## 2020-06-26 MED ORDER — ICOSAPENT ETHYL 1 G PO CAPS: ORAL_CAPSULE | ORAL | 1 refills | Status: DC

## 2020-06-26 MED ORDER — LISINOPRIL 2.5 MG PO TABS
2.5000 mg | ORAL_TABLET | Freq: Every day | ORAL | 1 refills | Status: DC
Start: 2020-06-26 — End: 2020-11-15

## 2020-06-26 MED ORDER — GVOKE HYPOPEN 1-PACK 1 MG/0.2ML ~~LOC~~ SOAJ: 1.0000 | Freq: Every day | SUBCUTANEOUS | 1 refills | Status: DC | PRN

## 2020-06-26 MED ORDER — METOPROLOL SUCCINATE ER 25 MG PO TB24: 12.5000 mg | ORAL_TABLET | Freq: Every day | ORAL | 1 refills | Status: DC

## 2020-06-26 MED ORDER — ATORVASTATIN CALCIUM 80 MG PO TABS: 80.0000 mg | ORAL_TABLET | Freq: Every day | ORAL | 1 refills | Status: DC

## 2020-06-26 MED ORDER — NOVOLOG FLEXPEN 100 UNIT/ML ~~LOC~~ SOPN: 2.0000 [IU] | PEN_INJECTOR | Freq: Three times a day (TID) | SUBCUTANEOUS | 2 refills | Status: DC

## 2020-06-26 MED ORDER — FAMOTIDINE 40 MG PO TABS: 40.0000 mg | ORAL_TABLET | Freq: Every day | ORAL | 1 refills | Status: DC

## 2020-06-26 MED ORDER — EZETIMIBE 10 MG PO TABS: 10.0000 mg | ORAL_TABLET | Freq: Every day | ORAL | 1 refills | Status: DC

## 2020-06-26 MED ORDER — PANTOPRAZOLE SODIUM 40 MG PO TBEC
40.0000 mg | DELAYED_RELEASE_TABLET | Freq: Every morning | ORAL | 1 refills | Status: DC
Start: 2020-06-26 — End: 2020-10-13

## 2020-06-26 MED ORDER — XIGDUO XR 5-1000 MG PO TB24: 1.0000 | ORAL_TABLET | Freq: Two times a day (BID) | ORAL | 1 refills | Status: DC

## 2020-07-09 ENCOUNTER — Other Ambulatory Visit: Payer: Self-pay | Admitting: Family Medicine

## 2020-07-09 DIAGNOSIS — I25118 Atherosclerotic heart disease of native coronary artery with other forms of angina pectoris: Secondary | ICD-10-CM

## 2020-07-09 DIAGNOSIS — E1121 Type 2 diabetes mellitus with diabetic nephropathy: Secondary | ICD-10-CM

## 2020-07-09 DIAGNOSIS — E1169 Type 2 diabetes mellitus with other specified complication: Secondary | ICD-10-CM

## 2020-07-09 NOTE — Telephone Encounter (Signed)
Future visit in 2 months  

## 2020-07-27 ENCOUNTER — Other Ambulatory Visit: Payer: Self-pay | Admitting: Family Medicine

## 2020-07-27 DIAGNOSIS — K219 Gastro-esophageal reflux disease without esophagitis: Secondary | ICD-10-CM

## 2020-08-05 ENCOUNTER — Other Ambulatory Visit: Payer: Self-pay

## 2020-08-05 ENCOUNTER — Ambulatory Visit
Admission: EM | Admit: 2020-08-05 | Discharge: 2020-08-05 | Disposition: A | Discharge status: Home / Self Care | Payer: BC Managed Care – PPO | Attending: Emergency Medicine | Admitting: Emergency Medicine

## 2020-08-05 DIAGNOSIS — L255 Unspecified contact dermatitis due to plants, except food: Secondary | ICD-10-CM | POA: Diagnosis not present

## 2020-08-05 MED ORDER — TRIAMCINOLONE ACETONIDE 0.1 % EX CREA: 1.0000 "application " | TOPICAL_CREAM | Freq: Two times a day (BID) | CUTANEOUS | 0 refills | Status: DC

## 2020-08-05 MED ORDER — PREDNISONE 20 MG PO TABS: ORAL_TABLET | ORAL | 0 refills | Status: AC

## 2020-08-05 NOTE — Discharge Instructions (Signed)
Kenalog cream to your arms and thin amount on your forehead. Do not apply to your eyes or your penis.  Prednisone taper as prescribed. May stop taking if symptoms have resolved. This medication will increase your blood sugar while you are taking so please be diligent about monitoring your blood sugar so you can dose your insulin appropriately and monitor your diet.  It will go back to your baseline once the medication is completed.  Cool compresses.  Return for any worsening or further concerns.

## 2020-08-05 NOTE — ED Triage Notes (Signed)
Pt c/o rash to both lower arms, his face and body. Pt states rash has been spreading since Friday after working in the yard and clearing brush. Pt denies f/n/v/d or other symptoms.

## 2020-08-05 NOTE — ED Provider Notes (Signed)
[facility]    CSN: [id number] Arrival date & time: 08/05/20  0853      History   Chief Complaint Chief Complaint  Patient presents with  . Rash    HPI Jesse Lawrence. is a 60 y.o. male.   Jesse Lawrence. presents with complaints of rash which started two days ago. He had been working in his yard, the fence line, just prior to onset of symptoms. Itches. No pain or drainage. Has had similar in the past related to a contact dermatitis from a plant- suspected poison ivy/oak/ sumac. Rash to bilateral forearms, forehead, upper eyelids, and penis. He has applied a cream he had at home from two years ago but it hasn't necessarily helped. He is diabetic, saw his PCP 3/22 and had medications adjusted after lapse in insurance and hgba1c of 10.9. he states he has been more diligent with his medications and blood sugars have been improving. He checks his sugar three times a day.    ROS per HPI, negative if not otherwise mentioned.      Past Medical History:  Diagnosis Date  . Allergy   . Anxiety   . COPD (chronic obstructive pulmonary disease) ([place])   . Diabetes mellitus without complication ([place])   . Hyperlipidemia   . Hypertension   . Male impotence   . MI (myocardial infarction) ([place])   . Obesity   . PNA (pneumonia)     Patient Active Problem List   Diagnosis Date Noted  . Coronary artery disease of native artery of native heart with stable angina pectoris ([place]) 11/26/2018  . OSA (obstructive sleep apnea) 05/21/2015  . Perennial allergic rhinitis 11/07/2014  . Arteriosclerosis of coronary artery 11/07/2014  . CD (contact dermatitis) 11/07/2014  . Decreased libido 11/07/2014  . Diabetes mellitus with renal manifestation ([place]) 11/07/2014  . Gastro-esophageal reflux disease without esophagitis 11/07/2014  . H/O acute myocardial infarction 11/07/2014  . Hypercholesteremia 11/07/2014  . Benign hypertension 11/07/2014  . Hypertriglyceridemia 11/07/2014  .  H/O high risk medication treatment 11/07/2014  . NASH (nonalcoholic steatohepatitis) 11/07/2014  . Microalbuminuria 11/07/2014  . Adult BMI 30+ 11/07/2014  . Fungal infection of toenail 11/07/2014  . Tobacco abuse 11/07/2014  . ED (erectile dysfunction) of organic origin 09/29/2006    Past Surgical History:  Procedure Laterality Date  . cadiac stenting    . CYSTECTOMY     60 years of age  . KNEE ARTHROSCOPY         Home Medications    Prior to Admission medications   Medication Sig Start Date End Date Taking? Authorizing Provider  atorvastatin (LIPITOR) 80 MG tablet Take 1 tablet (80 mg total) by mouth daily at 6 PM. 06/26/20  Yes Sowles, Drue Stager, MD  clopidogrel (PLAVIX) 75 MG tablet TAKE 1 TABLET BY MOUTH EVERY DAY 07/09/20  Yes Sowles, Drue Stager, MD  ezetimibe (ZETIA) 10 MG tablet Take 1 tablet (10 mg total) by mouth daily. 06/26/20  Yes Sowles, Drue Stager, MD  famotidine (PEPCID) 40 MG tablet Take 1 tablet (40 mg total) by mouth at bedtime. 06/26/20  Yes Sowles, Drue Stager, MD  fluticasone (FLONASE) 50 MCG/ACT nasal spray Place 2 sprays into both nostrils as needed. 02/25/19  Yes Sowles, Drue Stager, MD  Glucagon (GVOKE HYPOPEN 1-PACK) 1 MG/0.2ML SOAJ Inject 1 each into the skin daily as needed. If glucose very low and unable to eat/drink 06/26/20  Yes Sowles, Drue Stager, MD  icosapent Ethyl (VASCEPA) 1 g capsule TAKE 2 CAPSULES BY MOUTH  2 (TWO) TIMES DAILY. 06/26/20  Yes Sowles, Drue Stager, MD  insulin aspart (NOVOLOG FLEXPEN) 100 UNIT/ML FlexPen Inject 2-12 Units into the skin 3 (three) times daily with meals. Pre-meal insulin :  Check fsbs prior to meals  Hold if below 110 If glucose 110- 140 give 4 units If glucose 140 to 180 give 6 units  If glucose 180 to 220  give 8 units If glucose above 220 give 10 units If glucose above 220 use 12 units 06/26/20  Yes Sowles, Drue Stager, MD  lisinopril (ZESTRIL) 2.5 MG tablet Take 1 tablet (2.5 mg total) by mouth daily. 06/26/20  Yes Sowles, Drue Stager, MD   loratadine (CLARITIN) 10 MG tablet Take 1 tablet by mouth daily. 08/09/12  Yes [provider]  metaxalone (SKELAXIN) 800 MG tablet TAKE 1 TABLET BY MOUTH THREE TIMES A DAY 01/12/20  Yes Sowles, Drue Stager, MD  metoprolol succinate (TOPROL-XL) 25 MG 24 hr tablet Take 0.5 tablets (12.5 mg total) by mouth daily. 06/26/20  Yes Sowles, Drue Stager, MD  pantoprazole (PROTONIX) 40 MG tablet Take 1 tablet (40 mg total) by mouth in the morning. 06/26/20  Yes Sowles, Drue Stager, MD  predniSONE (DELTASONE) 20 MG tablet Take 3 tablets (60 mg total) by mouth daily with breakfast for 2 days, THEN 2 tablets (40 mg total) daily with breakfast for 2 days, THEN 1 tablet (20 mg total) daily with breakfast for 2 days. 08/05/20 08/11/20 Yes Carmello Cabiness, Lanelle Bal B, NP  sildenafil (VIAGRA) 100 MG tablet TAKE 1/2 TO 1 TABLET BY MOUTH DAILY AS NEEDED FOR ERECTILE DYSFUNCTION 06/16/20  Yes Sowles, Drue Stager, MD  triamcinolone cream (KENALOG) 0.1 % Apply 1 application topically 2 (two) times daily. 08/05/20  Yes Deonne Rooks B, NP  XIGDUO XR 08-998 MG TB24 Take 1 tablet by mouth in the morning and at bedtime. 06/26/20  Yes Sowles, Drue Stager, MD  Insulin Pen Needle (BD PEN NEEDLE NANO 2ND GEN) 32G X 4 MM MISC Inject 1 each into the skin in the morning and at bedtime. 06/26/20   Steele Sizer, MD  [facility] ULTRA test strip USE AS DIRECTED 07/04/19   Steele Sizer, MD    Family History Family History  Problem Relation Age of Onset  . Diabetes Mother   . CAD Mother   . Heart disease Mother   . Heart attack Mother   . Heart disease Father   . Heart attack Father     Social History Social History   Tobacco Use  . Smoking status: Former Smoker    Packs/day: 1.50    Years: 30.00    Pack years: 45.00    Types: Cigarettes    Quit date: 04/19/2013    Years since quitting: 7.3  . Smokeless tobacco: Current User  Vaping Use  . Vaping Use: Former  Substance Use Topics  . Alcohol use: No    Alcohol/week: 0.0 standard drinks    Comment:  occ  . Drug use: No     Allergies   Patient has no known allergies.   Review of Systems Review of Systems   Physical Exam Triage Vital Signs ED Triage Vitals  Enc Vitals Group     BP 08/05/20 0906 138/78     Pulse Rate 08/05/20 0906 83     Resp 08/05/20 0906 18     Temp 08/05/20 0906 98.3 F (36.8 C)     Temp Source 08/05/20 0906 Oral     SpO2 08/05/20 0906 97 %     Weight 08/05/20 0904 205 lb (  93 kg)     Height 08/05/20 0904 5' 8"  (1.727 m)     Head Circumference --      Peak Flow --      Pain Score 08/05/20 0904 0     Pain Loc --      Pain Edu? --      Excl. in [place]? --    No data found.  Updated Vital Signs BP 138/78 (BP Location: Left Arm)   Pulse 83   Temp 98.3 F (36.8 C) (Oral)   Resp 18   Ht 5' 8"  (1.727 m)   Wt 205 lb (93 kg)   SpO2 97%   BMI 31.17 kg/m   Physical Exam Constitutional:      Appearance: He is well-developed.  Cardiovascular:     Rate and Rhythm: Normal rate.  Pulmonary:     Effort: Pulmonary effort is normal.  Skin:    General: Skin is warm and dry.     Comments: Red raised streaky rash to bilateral forearms over antecubital spaces; mild rash to forehead and noted to upper and inner eyelids bilaterally without swelling (penile exam deferred, patient states rash appears the same, to penile shaft); no pustules or blisters  Neurological:     Mental Status: He is alert and oriented to person, place, and time.      UC Treatments / Results  Labs (all labs ordered are listed, but only abnormal results are displayed) Labs Reviewed - No data to display  EKG   Radiology No results found.  Procedures Procedures (including critical care time)  Medications Ordered in UC Medications - No data to display  Initial Impression / Assessment and Plan / UC Course  I have reviewed the triage vital signs and the nursing notes.  Pertinent labs & imaging results that were available during my care of the patient were reviewed by me and  considered in my medical decision making (see chart for details).     Per history and exam, consistent with a contact dermatitis. Supportive cares recommended. Kenalog. Patient concerned about his eyelids in particular, agreeable to a course of prednisone, blood sugar education provided and emphasized. Return precautions provided. Patient verbalized understanding and agreeable to plan.   Final Clinical Impressions(s) / UC Diagnoses   Final diagnoses:  Contact dermatitis due to plants, except food, unspecified contact dermatitis type     Discharge Instructions     Kenalog cream to your arms and thin amount on your forehead. Do not apply to your eyes or your penis.  Prednisone taper as prescribed. May stop taking if symptoms have resolved. This medication will increase your blood sugar while you are taking so please be diligent about monitoring your blood sugar so you can dose your insulin appropriately and monitor your diet.  It will go back to your baseline once the medication is completed.  Cool compresses.  Return for any worsening or further concerns.    ED Prescriptions    Medication Sig Dispense Auth. Provider   triamcinolone cream (KENALOG) 0.1 % Apply 1 application topically 2 (two) times daily. 30 g Augusto Gamble B, NP   predniSONE (DELTASONE) 20 MG tablet Take 3 tablets (60 mg total) by mouth daily with breakfast for 2 days, THEN 2 tablets (40 mg total) daily with breakfast for 2 days, THEN 1 tablet (20 mg total) daily with breakfast for 2 days. 12 tablet Zigmund Gottron, NP     PDMP not reviewed this encounter.   Augusto Gamble  B, NP 08/05/20 [id number]

## 2020-08-07 ENCOUNTER — Other Ambulatory Visit: Payer: Self-pay | Admitting: Family Medicine

## 2020-08-07 DIAGNOSIS — E1121 Type 2 diabetes mellitus with diabetic nephropathy: Secondary | ICD-10-CM

## 2020-08-07 NOTE — Telephone Encounter (Signed)
Requested Prescriptions  Pending Prescriptions Disp Refills  . ONETOUCH ULTRA test strip [employer] Med Name: [facility] TEST STRP] 100 strip 11    Sig: USE AS DIRECTED     Endocrinology: Diabetes - Testing Supplies Passed - 08/07/2020 12:18 PM      Passed - Valid encounter within last 12 months    Recent Outpatient Visits          1 month ago Type 2 diabetes mellitus with diabetic nephropathy, without long-term current use of insulin [facility])   [facility] [place], Drue Stager, MD   8 months ago Type 2 diabetes mellitus with diabetic nephropathy, without long-term current use of insulin [facility])   [facility] [place], Drue Stager, MD   11 months ago Type 2 diabetes mellitus with diabetic nephropathy, without long-term current use of insulin [facility])   [facility] [place], Drue Stager, MD   1 year ago Type 2 diabetes mellitus with diabetic nephropathy, without long-term current use of insulin [facility])   [facility] [place], Drue Stager, MD   1 year ago Type 2 diabetes mellitus with diabetic nephropathy, without long-term current use of insulin [facility])   [facility] Steele Sizer, MD      Future Appointments            In 1 month Ancil Boozer, Drue Stager, MD [facility], [facility]

## 2020-08-13 ENCOUNTER — Other Ambulatory Visit: Payer: Self-pay

## 2020-08-13 DIAGNOSIS — E1121 Type 2 diabetes mellitus with diabetic nephropathy: Secondary | ICD-10-CM | POA: Diagnosis not present

## 2020-08-13 DIAGNOSIS — E1169 Type 2 diabetes mellitus with other specified complication: Secondary | ICD-10-CM | POA: Diagnosis not present

## 2020-08-13 DIAGNOSIS — E785 Hyperlipidemia, unspecified: Secondary | ICD-10-CM | POA: Diagnosis not present

## 2020-08-13 DIAGNOSIS — I10 Essential (primary) hypertension: Secondary | ICD-10-CM | POA: Diagnosis not present

## 2020-08-14 LAB — CBC WITH DIFFERENTIAL/PLATELET
Absolute Monocytes: 801 cells/uL (ref 200–950)
Basophils Absolute: 55 cells/uL (ref 0–200)
Basophils Relative: 0.6 %
Eosinophils Absolute: 127 cells/uL (ref 15–500)
Eosinophils Relative: 1.4 %
HCT: 49.7 % (ref 38.5–50.0)
Hemoglobin: 16.2 g/dL (ref 13.2–17.1)
Lymphs Abs: 2266 cells/uL (ref 850–3900)
MCH: 30.9 pg (ref 27.0–33.0)
MCHC: 32.6 g/dL (ref 32.0–36.0)
MCV: 94.8 fL (ref 80.0–100.0)
MPV: 11 fL (ref 7.5–12.5)
Monocytes Relative: 8.8 %
Neutro Abs: 5851 cells/uL (ref 1500–7800)
Neutrophils Relative %: 64.3 %
Platelets: 270 10*3/uL (ref 140–400)
RBC: 5.24 10*6/uL (ref 4.20–5.80)
RDW: 11.6 % (ref 11.0–15.0)
Total Lymphocyte: 24.9 %
WBC: 9.1 10*3/uL (ref 3.8–10.8)

## 2020-08-14 LAB — COMPLETE METABOLIC PANEL WITH GFR
AG Ratio: 1.9 (calc) (ref 1.0–2.5)
ALT: 20 U/L (ref 9–46)
AST: 13 U/L (ref 10–35)
Albumin: 4.7 g/dL (ref 3.6–5.1)
Alkaline phosphatase (APISO): 56 U/L (ref 35–144)
BUN: 14 mg/dL (ref 7–25)
CO2: 28 mmol/L (ref 20–32)
Calcium: 9.5 mg/dL (ref 8.6–10.3)
Chloride: 101 mmol/L (ref 98–110)
Creat: 0.71 mg/dL (ref 0.70–1.33)
GFR, Est African American: 121 mL/min/{1.73_m2} (ref >=60)
GFR, Est Non African American: 104 mL/min/{1.73_m2} (ref >=60)
Globulin: 2.5 g/dL (calc) (ref 1.9–3.7)
Glucose, Bld: 213 mg/dL — ABNORMAL HIGH (ref 65–99)
Potassium: 4.7 mmol/L (ref 3.5–5.3)
Sodium: 138 mmol/L (ref 135–146)
Total Bilirubin: 1.1 mg/dL (ref 0.2–1.2)
Total Protein: 7.2 g/dL (ref 6.1–8.1)

## 2020-08-14 LAB — MICROALBUMIN / CREATININE URINE RATIO
Creatinine, Urine: 38 mg/dL (ref 20–320)
Microalb Creat Ratio: 182 mcg/mg creat — ABNORMAL HIGH (ref <30)
Microalb, Ur: 6.9 mg/dL

## 2020-08-14 LAB — LIPID PANEL
Cholesterol: 96 mg/dL (ref <200)
HDL: 28 mg/dL — ABNORMAL LOW (ref >=40)
LDL Cholesterol (Calc): 42 mg/dL (calc)
Non-HDL Cholesterol (Calc): 68 mg/dL (calc) (ref <130)
Total CHOL/HDL Ratio: 3.4 (calc) (ref <5.0)
Triglycerides: 181 mg/dL — ABNORMAL HIGH (ref <150)

## 2020-08-14 LAB — ADVANCED WRITTEN NOTIFICATION (AWN) TEST REFUSAL: AWN TEST REFUSED: 927

## 2020-08-26 ENCOUNTER — Other Ambulatory Visit: Payer: Self-pay | Admitting: Family Medicine

## 2020-08-26 DIAGNOSIS — N529 Male erectile dysfunction, unspecified: Secondary | ICD-10-CM

## 2020-08-26 NOTE — Telephone Encounter (Signed)
Requested Prescriptions  Pending Prescriptions Disp Refills  . sildenafil (VIAGRA) 100 MG tablet [Pharmacy Med Name: SILDENAFIL 100 MG TABLET] 30 tablet 0    Sig: TAKE 0.5-1 TABLET BY MOUTH DAILY AS NEEDED FOR ERECTILE DYSFUNCTION     Urology: Erectile Dysfunction Agents Passed - 08/26/2020  1:07 PM      Passed - Last BP in normal range    BP Readings from Last 1 Encounters:  08/05/20 138/78         Passed - Valid encounter within last 12 months    Recent Outpatient Visits          2 months ago Type 2 diabetes mellitus with diabetic nephropathy, without long-term current use of insulin [facility])   [facility] [place], Drue Stager, MD   8 months ago Type 2 diabetes mellitus with diabetic nephropathy, without long-term current use of insulin [facility])   [facility] [place], Drue Stager, MD   11 months ago Type 2 diabetes mellitus with diabetic nephropathy, without long-term current use of insulin [facility])   [facility] [place], Drue Stager, MD   1 year ago Type 2 diabetes mellitus with diabetic nephropathy, without long-term current use of insulin [facility])   [facility] [place], Drue Stager, MD   1 year ago Type 2 diabetes mellitus with diabetic nephropathy, without long-term current use of insulin [facility])   [facility] Steele Sizer, MD      Future Appointments            In 1 month Ancil Boozer, Drue Stager, MD [facility], [facility]

## 2020-10-02 ENCOUNTER — Ambulatory Visit: Payer: BC Managed Care – PPO | Admitting: Family Medicine

## 2020-10-13 ENCOUNTER — Other Ambulatory Visit: Payer: Self-pay | Admitting: Family Medicine

## 2020-10-13 DIAGNOSIS — K219 Gastro-esophageal reflux disease without esophagitis: Secondary | ICD-10-CM

## 2020-10-13 DIAGNOSIS — N529 Male erectile dysfunction, unspecified: Secondary | ICD-10-CM

## 2020-10-13 NOTE — Telephone Encounter (Signed)
Future OV 11/05/20 Sildenafil and protonix approved per protocol. Actos refused-it is not on patient's current MAR. Requested Prescriptions  Pending Prescriptions Disp Refills  . sildenafil (VIAGRA) 100 MG tablet [Pharmacy Med Name: SILDENAFIL 100 MG TABLET] 30 tablet 0    Sig: TAKE ONE-HALF TO ONE TABLET BY MOUTH DAILY AS NEEDED FOR ERECTILE DYSFUNCTION.     Urology: Erectile Dysfunction Agents Passed - 10/13/2020 10:16 AM      Passed - Last BP in normal range    BP Readings from Last 1 Encounters:  08/05/20 138/78         Passed - Valid encounter within last 12 months    Recent Outpatient Visits          3 months ago Type 2 diabetes mellitus with diabetic nephropathy, without long-term current use of insulin [facility])   [facility] [place], Drue Stager, MD   10 months ago Type 2 diabetes mellitus with diabetic nephropathy, without long-term current use of insulin [facility])   [facility] [place], Drue Stager, MD   1 year ago Type 2 diabetes mellitus with diabetic nephropathy, without long-term current use of insulin [facility])   [facility] [place], Drue Stager, MD   1 year ago Type 2 diabetes mellitus with diabetic nephropathy, without long-term current use of insulin [facility])   [facility] [place], Drue Stager, MD   1 year ago Type 2 diabetes mellitus with diabetic nephropathy, without long-term current use of insulin [facility])   [facility] Steele Sizer, MD      Future Appointments            In 3 weeks Ancil Boozer, Drue Stager, MD [facility] [place]           . pantoprazole (PROTONIX) 40 MG tablet [Pharmacy Med Name: PANTOPRAZOLE SOD DR 40 MG TAB] 60 tablet     Sig: TAKE ONE TABLET BY MOUTH TWICE A DAY     Gastroenterology: Proton Pump Inhibitors Passed - 10/13/2020 10:16 AM      Passed - Valid encounter within last 12 months    Recent Outpatient Visits          3 months ago Type 2 diabetes mellitus  with diabetic nephropathy, without long-term current use of insulin [facility])   [facility] [place], Drue Stager, MD   10 months ago Type 2 diabetes mellitus with diabetic nephropathy, without long-term current use of insulin [facility])   [facility] [place], Drue Stager, MD   1 year ago Type 2 diabetes mellitus with diabetic nephropathy, without long-term current use of insulin [facility])   [facility] [place], Drue Stager, MD   1 year ago Type 2 diabetes mellitus with diabetic nephropathy, without long-term current use of insulin [facility])   [facility] [place], Drue Stager, MD   1 year ago Type 2 diabetes mellitus with diabetic nephropathy, without long-term current use of insulin [facility])   [facility] Steele Sizer, MD      Future Appointments            In 3 weeks Ancil Boozer, Drue Stager, MD [facility] [place]           . pioglitazone (ACTOS) 15 MG tablet [Pharmacy Med Name: PIOGLITAZONE HCL 15 MG TABLET] 90 tablet 0    Sig: TAKE ONE TABLET BY MOUTH DAILY     Endocrinology:  Diabetes - Glitazones - pioglitazone Failed - 10/13/2020 10:16 AM  Failed - HBA1C is between 0 and 7.9 and within 180 days    Hemoglobin A1C  Date Value Ref Range Status  06/26/2020 10.5 (A) 4.0 - 5.6 % Final   HbA1c, POC (controlled diabetic range)  Date Value Ref Range Status  11/26/2018 5.7 0.0 - 7.0 % Final         Passed - Valid encounter within last 6 months    Recent Outpatient Visits          3 months ago Type 2 diabetes mellitus with diabetic nephropathy, without long-term current use of insulin [facility])   [facility] [place], Drue Stager, MD   10 months ago Type 2 diabetes mellitus with diabetic nephropathy, without long-term current use of insulin [facility]   [facility] [place], Drue Stager, MD   1 year ago Type 2 diabetes mellitus with diabetic nephropathy, without long-term current use of  insulin [facility])   [facility] [place], Drue Stager, MD   1 year ago Type 2 diabetes mellitus with diabetic nephropathy, without long-term current use of insulin [facility])   [facility] [place], Drue Stager, MD   1 year ago Type 2 diabetes mellitus with diabetic nephropathy, without long-term current use of insulin [facility])   [facility] Steele Sizer, MD      Future Appointments            In 3 weeks Ancil Boozer, Drue Stager, MD [facility], [facility]

## 2020-11-02 NOTE — Progress Notes (Deleted)
Name: Jesse Lawrence.   MRN: [id number]    DOB: 1960-04-14   Date:11/02/2020       Progress Note  Subjective  Chief Complaint  Follow Up  HPI  DM II :   His A1C had been at goal for a long time, however had a gap of insurance and medications, today his A1C is up to 10.5%, he states he just got his insurance back, and resumed  Xigduo 08/998 two daily, we gave him some samples of [place] however he ran out weeks ago and would like to go back to [place]. Explained since glucose is so high we will also have to give him some Novolog to take before largest meal of the day. He states his diet is "not bad". Glucose at home has been 180-190's, post-prandially as high as 500's. He lost 10 lbs. He needs labs and urine micro, but he will return prior to next visit when he is back on his medications.      HTN/CAD:no palpitation, denies chest pain described as tightness ( having heart burn and dysphagia) , no longer having dizziness, on low dose ACE and half pill metoprolol , he has not been taking Zetia or Vascepa, not sure about Atorvastatin. Marland KitchenHe sees cardiologist - Dr. Rockey Situ    ED: he has difficulty maintaining and erection, he used to take Cialis, now on viagra and denies side effects .He does not need a refill today    OSA: he has not been compliant with CPAP machine, he cannot tolerated it, he is aware of the importance of wearing it    Migraine headache: he states he has a history of migraines, used to go to [facility] when severe and has seen a headache specialist about 20 years ago. He stopped using Nortriptyline months ago and migraine is stable at above 2 times per month    GERD: he states symptoms have improved, we will go back to pantoprazole once a day and pepcid prn at night.   Numbness on his finger tips: he works as a Dealer, sometimes shooting pain down his hands. It does not bother him at work, usually notices more when at rest.   Patient Active Problem List   Diagnosis Date Noted    Coronary artery disease of native artery of native heart with stable angina pectoris ([place]) 11/26/2018   OSA (obstructive sleep apnea) 05/21/2015   Perennial allergic rhinitis 11/07/2014   Arteriosclerosis of coronary artery 11/07/2014   CD (contact dermatitis) 11/07/2014   Decreased libido 11/07/2014   Diabetes mellitus with renal manifestation ([place]) 11/07/2014   Gastro-esophageal reflux disease without esophagitis 11/07/2014   H/O acute myocardial infarction 11/07/2014   Hypercholesteremia 11/07/2014   Benign hypertension 11/07/2014   Hypertriglyceridemia 11/07/2014   H/O high risk medication treatment 11/07/2014   NASH (nonalcoholic steatohepatitis) 11/07/2014   Microalbuminuria 11/07/2014   Adult BMI 30+ 11/07/2014   Fungal infection of toenail 11/07/2014   Tobacco abuse 11/07/2014   ED (erectile dysfunction) of organic origin 09/29/2006    Past Surgical History:  Procedure Laterality Date   cadiac stenting     CYSTECTOMY     60 years of age   KNEE ARTHROSCOPY      Family History  Problem Relation Age of Onset   Diabetes Mother    CAD Mother    Heart disease Mother    Heart attack Mother    Heart disease Father    Heart attack Father     Social History  Tobacco Use   Smoking status: Former    Packs/day: 1.50    Years: 30.00    Pack years: 45.00    Types: Cigarettes    Quit date: 04/19/2013    Years since quitting: 7.5   Smokeless tobacco: Current  Substance Use Topics   Alcohol use: No    Alcohol/week: 0.0 standard drinks    Comment: occ     Current Outpatient Medications:    atorvastatin (LIPITOR) 80 MG tablet, Take 1 tablet (80 mg total) by mouth daily at 6 PM., Disp: 90 tablet, Rfl: 1   clopidogrel (PLAVIX) 75 MG tablet, TAKE 1 TABLET BY MOUTH EVERY DAY, Disp: 90 tablet, Rfl: 1   ezetimibe (ZETIA) 10 MG tablet, Take 1 tablet (10 mg total) by mouth daily., Disp: 90 tablet, Rfl: 1   famotidine (PEPCID) 40 MG tablet, Take 1 tablet (40 mg total) by mouth  at bedtime., Disp: 90 tablet, Rfl: 1   fluticasone (FLONASE) 50 MCG/ACT nasal spray, Place 2 sprays into both nostrils as needed., Disp: 48 g, Rfl: 1   Glucagon (GVOKE HYPOPEN 1-PACK) 1 MG/0.2ML SOAJ, Inject 1 each into the skin daily as needed. If glucose very low and unable to eat/drink, Disp: 0.2 mL, Rfl: 1   icosapent Ethyl (VASCEPA) 1 g capsule, TAKE 2 CAPSULES BY MOUTH 2 (TWO) TIMES DAILY., Disp: 360 capsule, Rfl: 1   insulin aspart (NOVOLOG FLEXPEN) 100 UNIT/ML FlexPen, Inject 2-12 Units into the skin 3 (three) times daily with meals. Pre-meal insulin :  Check fsbs prior to meals  Hold if below 110 If glucose 110- 140 give 4 units If glucose 140 to 180 give 6 units  If glucose 180 to 220  give 8 units If glucose above 220 give 10 units If glucose above 220 use 12 units, Disp: 15 mL, Rfl: 2   Insulin Pen Needle (BD PEN NEEDLE NANO 2ND GEN) 32G X 4 MM MISC, Inject 1 each into the skin in the morning and at bedtime., Disp: 200 each, Rfl: 2   lisinopril (ZESTRIL) 2.5 MG tablet, Take 1 tablet (2.5 mg total) by mouth daily., Disp: 90 tablet, Rfl: 1   loratadine (CLARITIN) 10 MG tablet, Take 1 tablet by mouth daily., Disp: , Rfl:    metaxalone (SKELAXIN) 800 MG tablet, TAKE 1 TABLET BY MOUTH THREE TIMES A DAY, Disp: 90 tablet, Rfl: 2   metoprolol succinate (TOPROL-XL) 25 MG 24 hr tablet, Take 0.5 tablets (12.5 mg total) by mouth daily., Disp: 45 tablet, Rfl: 1   ONETOUCH ULTRA test strip, USE AS DIRECTED, Disp: 100 strip, Rfl: 11   pantoprazole (PROTONIX) 40 MG tablet, TAKE ONE TABLET BY MOUTH TWICE A DAY, Disp: 60 tablet, Rfl: 0   sildenafil (VIAGRA) 100 MG tablet, TAKE ONE-HALF TO ONE TABLET BY MOUTH DAILY AS NEEDED FOR ERECTILE DYSFUNCTION., Disp: 30 tablet, Rfl: 0   triamcinolone cream (KENALOG) 0.1 %, Apply 1 application topically 2 (two) times daily., Disp: 30 g, Rfl: 0   XIGDUO XR 08-998 MG TB24, Take 1 tablet by mouth in the morning and at bedtime., Disp: 180 tablet, Rfl: 1  No Known  Allergies  I personally reviewed {Reviewed:14835} with the patient/caregiver today.   ROS  ***  Objective  There were no vitals filed for this visit.  There is no height or weight on file to calculate BMI.  Physical Exam ***  Recent Results (from the past 2160 hour(s))  Lipid panel     Status: Abnormal   Collection Time:  08/13/20  8:05 AM  Result Value Ref Range   Cholesterol 96 <200 mg/dL   HDL 28 (L) > OR = 40 mg/dL   Triglycerides 181 (H) <150 mg/dL   LDL Cholesterol (Calc) 42 mg/dL (calc)    Comment: Reference range: <100 . Desirable range <100 mg/dL for primary prevention;   <70 mg/dL for patients with CHD or diabetic patients  with > or = 2 CHD risk factors. Marland Kitchen LDL-C is now calculated using the Martin-Hopkins  calculation, which is a validated novel method providing  better accuracy than the Friedewald equation in the  estimation of LDL-C.  Cresenciano Genre et al. Annamaria Helling. 6606;004(59): 2061-2068  (http://education.[facility].com/faq/FAQ164)    Total CHOL/HDL Ratio 3.4 <5.0 (calc)   Non-HDL Cholesterol (Calc) 68 <130 mg/dL (calc)    Comment: For patients with diabetes plus 1 major ASCVD risk  factor, treating to a non-HDL-C goal of <100 mg/dL  (LDL-C of <70 mg/dL) is considered a therapeutic  option.   Microalbumin / creatinine urine ratio     Status: Abnormal   Collection Time: 08/13/20  8:05 AM  Result Value Ref Range   Creatinine, Urine 38 20 - 320 mg/dL   Microalb, Ur 6.9 mg/dL    Comment: Reference Range Not established    Microalb Creat Ratio 182 (H) <30 mcg/mg creat    Comment: . The ADA defines abnormalities in albumin excretion as follows: Marland Kitchen Albuminuria Category        Result (mcg/mg creatinine) . Normal to Mildly increased   <30 Moderately increased         30-299  Severely increased           > OR = 300 . The ADA recommends that at least two of three specimens collected within a 3-6 month period be abnormal before considering a patient to  be within a diagnostic category.   COMPLETE METABOLIC PANEL WITH GFR     Status: Abnormal   Collection Time: 08/13/20  8:05 AM  Result Value Ref Range   Glucose, Bld 213 (H) 65 - 99 mg/dL    Comment: .            Fasting reference interval . For someone without known diabetes, a glucose value >125 mg/dL indicates that they may have diabetes and this should be confirmed with a follow-up test. .    BUN 14 7 - 25 mg/dL   Creat 0.71 0.70 - 1.33 mg/dL    Comment: For patients >76 years of age, the reference limit for Creatinine is approximately 13% higher for people identified as African-American. .    GFR, Est Non African American 104 > OR = 60 mL/min/1.46m   GFR, Est African American 121 > OR = 60 mL/min/1.766m  BUN/Creatinine Ratio NOT APPLICABLE 6 - 22 (calc)   Sodium 138 135 - 146 mmol/L   Potassium 4.7 3.5 - 5.3 mmol/L   Chloride 101 98 - 110 mmol/L   CO2 28 20 - 32 mmol/L   Calcium 9.5 8.6 - 10.3 mg/dL   Total Protein 7.2 6.1 - 8.1 g/dL   Albumin 4.7 3.6 - 5.1 g/dL   Globulin 2.5 1.9 - 3.7 g/dL (calc)   AG Ratio 1.9 1.0 - 2.5 (calc)   Total Bilirubin 1.1 0.2 - 1.2 mg/dL   Alkaline phosphatase (APISO) 56 35 - 144 U/L   AST 13 10 - 35 U/L   ALT 20 9 - 46 U/L  CBC with Differential/Platelet     Status: None  Collection Time: 08/13/20  8:05 AM  Result Value Ref Range   WBC 9.1 3.8 - 10.8 Thousand/uL   RBC 5.24 4.20 - 5.80 Million/uL   Hemoglobin 16.2 13.2 - 17.1 g/dL   HCT 49.7 38.5 - 50.0 %   MCV 94.8 80.0 - 100.0 fL   MCH 30.9 27.0 - 33.0 pg   MCHC 32.6 32.0 - 36.0 g/dL   RDW 11.6 11.0 - 15.0 %   Platelets 270 140 - 400 Thousand/uL   MPV 11.0 7.5 - 12.5 fL   Neutro Abs 5,851 1,500 - 7,800 cells/uL   Lymphs Abs 2,266 850 - 3,900 cells/uL   Absolute Monocytes 801 200 - 950 cells/uL   Eosinophils Absolute 127 15 - 500 cells/uL   Basophils Absolute 55 0 - 200 cells/uL   Neutrophils Relative % 64.3 %   Total Lymphocyte 24.9 %   Monocytes Relative 8.8 %    Eosinophils Relative 1.4 %   Basophils Relative 0.6 %  Advanced Written Notification [facility]) Test Refusal     Status: None   Collection Time: 08/13/20  8:05 AM  Result Value Ref Range   RAM1      Comment: . Be advised that your patient has indicated on the advance written notice their decision not to receive the following laboratory tests. As a result, the tests will not be performed.    AWN TEST REFUSED 927 VIT B12     Diabetic Foot Exam: Diabetic Foot Exam - Simple   No data filed    ***  PHQ2/9: Depression screen [facility] 2/9 06/26/2020 12/09/2019 09/02/2019 06/03/2019 02/25/2019  Decreased Interest 0 0 0 0 0  Down, Depressed, Hopeless 0 0 0 0 0  PHQ - 2 Score 0 0 0 0 0  Altered sleeping - 0 0 0 0  Tired, decreased energy - 0 0 0 0  Change in appetite - 0 0 0 0  Feeling bad or failure about yourself  - 0 0 0 0  Trouble concentrating - 0 0 0 0  Moving slowly or fidgety/restless - 0 0 0 0  Suicidal thoughts - 0 0 0 0  PHQ-9 Score - 0 0 0 0  Difficult doing work/chores - - - - -  Some recent data might be hidden    phq 9 is {gen pos [id number]} ***  Fall Risk: Fall Risk  06/26/2020 12/09/2019 09/02/2019 11/26/2018 11/09/2018  Falls in the past year? 0 0 0 0 0  Number falls in past yr: 0 0 0 0 0  Injury with Fall? 0 0 0 0 0   ***   Functional Status Survey:   ***   Assessment & Plan  *** There are no diagnoses linked to this encounter.

## 2020-11-05 ENCOUNTER — Ambulatory Visit: Payer: BC Managed Care – PPO | Admitting: Family Medicine

## 2020-11-05 DIAGNOSIS — E1121 Type 2 diabetes mellitus with diabetic nephropathy: Secondary | ICD-10-CM

## 2020-11-14 NOTE — Progress Notes (Signed)
Name: Jesse Lawrence.   MRN: [id number]    DOB: [birth year]   Date:11/15/2020       Progress Note  Subjective  Chief Complaint  Follow Up  HPI  DM II :   His A1C was at goal for a long time, however had a gap of insurance and medications, last visit his A1C was  10.5% today is up to 10.7 %. He has been out of Xigduo and used to take Xultophy, currently only on pre-meal insulin. He asked to not use daily injections, we will try [place] and Trulicity, resume Xigduo and once at goal try to go down on Tresiba again he has associated obesity, ED, HTN and dyslipidemia. Urine micro was high back in May, he is on ACE    HTN/CAD:no palpitation, denies chest pain or decrease in SOB, on low dose ACE and half pill metoprolol , he has not been taking Zetia or Vascepa, currently taking only Atorvastatin . Marland KitchenHe sees cardiologist - Dr. Rockey Situ    ED: he has difficulty maintaining and erection has been doing well on Viagra    OSA: he has not been compliant with CPAP machine, he cannot tolerated it, he is aware of the importance of wearing it . Unchanged   GERD: he states symptoms have improved, he is taking PPI in am and H2 blocker in the pm   Obesity: he lost weight since last visit , but likely from uncontrolled DM  Patient Active Problem List   Diagnosis Date Noted   Coronary artery disease of native artery of native heart with stable angina pectoris ([place]) 11/26/2018   OSA (obstructive sleep apnea) 05/21/2015   Perennial allergic rhinitis 11/07/2014   Arteriosclerosis of coronary artery 11/07/2014   CD (contact dermatitis) 11/07/2014   Decreased libido 11/07/2014   Diabetes mellitus with renal manifestation ([place]) 11/07/2014   Gastro-esophageal reflux disease without esophagitis 11/07/2014   H/O acute myocardial infarction 11/07/2014   Hypercholesteremia 11/07/2014   Benign hypertension 11/07/2014   Hypertriglyceridemia 11/07/2014   H/O high risk medication treatment 11/07/2014   NASH  (nonalcoholic steatohepatitis) 11/07/2014   Microalbuminuria 11/07/2014   Adult BMI 30+ 11/07/2014   Fungal infection of toenail 11/07/2014   Tobacco abuse 11/07/2014   ED (erectile dysfunction) of organic origin 09/29/2006    Past Surgical History:  Procedure Laterality Date   cadiac stenting     CYSTECTOMY     60 years of age   KNEE ARTHROSCOPY      Family History  Problem Relation Age of Onset   Diabetes Mother    CAD Mother    Heart disease Mother    Heart attack Mother    Heart disease Father    Heart attack Father     Social History   Tobacco Use   Smoking status: Former    Packs/day: 1.50    Years: 30.00    Pack years: 45.00    Types: Cigarettes    Quit date: 04/19/2013    Years since quitting: 7.5   Smokeless tobacco: Current  Substance Use Topics   Alcohol use: No    Alcohol/week: 0.0 standard drinks    Comment: occ     Current Outpatient Medications:    clopidogrel (PLAVIX) 75 MG tablet, TAKE 1 TABLET BY MOUTH EVERY DAY, Disp: 90 tablet, Rfl: 1   Dulaglutide (TRULICITY) 5.99 JT/7.0VX SOPN, Inject 0.75 mg into the skin once a week., Disp: 6 mL, Rfl: 0   fluticasone (FLONASE) 50 MCG/ACT nasal spray, Place  2 sprays into both nostrils as needed., Disp: 48 g, Rfl: 1   Glucagon (GVOKE HYPOPEN 1-PACK) 1 MG/0.2ML SOAJ, Inject 1 each into the skin daily as needed. If glucose very low and unable to eat/drink, Disp: 0.2 mL, Rfl: 1   insulin degludec (TRESIBA FLEXTOUCH) 100 UNIT/ML FlexTouch Pen, Inject 10-50 Units into the skin daily., Disp: 15 mL, Rfl: 0   Insulin Pen Needle (BD PEN NEEDLE NANO 2ND GEN) 32G X 4 MM MISC, Inject 1 each into the skin in the morning and at bedtime., Disp: 200 each, Rfl: 2   loratadine (CLARITIN) 10 MG tablet, Take 1 tablet by mouth daily., Disp: , Rfl:    metaxalone (SKELAXIN) 800 MG tablet, TAKE 1 TABLET BY MOUTH THREE TIMES A DAY, Disp: 90 tablet, Rfl: 2   ONETOUCH ULTRA test strip, USE AS DIRECTED, Disp: 100 strip, Rfl: 11    sildenafil (VIAGRA) 100 MG tablet, TAKE ONE-HALF TO ONE TABLET BY MOUTH DAILY AS NEEDED FOR ERECTILE DYSFUNCTION., Disp: 30 tablet, Rfl: 0   triamcinolone cream (KENALOG) 0.1 %, Apply 1 application topically 2 (two) times daily., Disp: 30 g, Rfl: 0   atorvastatin (LIPITOR) 80 MG tablet, Take 1 tablet (80 mg total) by mouth daily at 6 PM., Disp: 90 tablet, Rfl: 1   ezetimibe (ZETIA) 10 MG tablet, Take 1 tablet (10 mg total) by mouth daily., Disp: 90 tablet, Rfl: 1   famotidine (PEPCID) 40 MG tablet, Take 1 tablet (40 mg total) by mouth at bedtime., Disp: 90 tablet, Rfl: 1   icosapent Ethyl (VASCEPA) 1 g capsule, TAKE 2 CAPSULES BY MOUTH 2 (TWO) TIMES DAILY., Disp: 360 capsule, Rfl: 1   insulin aspart (NOVOLOG FLEXPEN) 100 UNIT/ML FlexPen, Inject 2-12 Units into the skin 3 (three) times daily with meals. Pre-meal insulin :  Check fsbs prior to meals  Hold if below 110 If glucose 110- 140 give 4 units If glucose 140 to 180 give 6 units  If glucose 180 to 220  give 8 units If glucose above 220 give 10 units If glucose above 220 use 12 units, Disp: 15 mL, Rfl: 2   lisinopril (ZESTRIL) 2.5 MG tablet, Take 1 tablet (2.5 mg total) by mouth daily., Disp: 90 tablet, Rfl: 1   metoprolol succinate (TOPROL-XL) 25 MG 24 hr tablet, Take 0.5 tablets (12.5 mg total) by mouth daily., Disp: 45 tablet, Rfl: 1   pantoprazole (PROTONIX) 40 MG tablet, Take 1 tablet (40 mg total) by mouth in the morning., Disp: 90 tablet, Rfl: 1   XIGDUO XR 08-998 MG TB24, Take 1 tablet by mouth in the morning and at bedtime., Disp: 180 tablet, Rfl: 1  No Known Allergies  I personally reviewed active problem list, medication list, allergies, family history, social history, health maintenance with the patient/caregiver today.   ROS  Constitutional: Negative for fever or weight change.  Respiratory: Negative for cough and shortness of breath.   Cardiovascular: Negative for chest pain or palpitations.  Gastrointestinal: Negative for  abdominal pain, no bowel changes.  Musculoskeletal: Negative for gait problem or joint swelling.  Skin: Negative for rash.  Neurological: Negative for dizziness or headache.  No other specific complaints in a complete review of systems (except as listed in HPI above).   Objective  Vitals:   11/15/20 1002  BP: 120/78  Pulse: 72  Resp: 16  Temp: 97.9 F (36.6 C)  SpO2: 96%  Weight: 206 lb (93.4 kg)  Height: 5' 8"  (1.727 m)    Body mass  index is 31.32 kg/m.  Physical Exam  Constitutional: Patient appears well-developed and well-nourished. Obese  No distress.  HEENT: head atraumatic, normocephalic, pupils equal and reactive to light,neck supple,  Cardiovascular: Normal rate, regular rhythm and normal heart sounds.  No murmur heard. No BLE edema. Pulmonary/Chest: Effort normal and breath sounds normal. No respiratory distress. Abdominal: Soft.  There is no tenderness. Psychiatric: Patient has a normal mood and affect. behavior is normal. Judgment and thought content normal.   Diabetic Foot Exam: Diabetic Foot Exam - Simple   Simple Foot Form Diabetic Foot exam was performed with the following findings: Yes 11/15/2020 10:39 AM  Visual Inspection See comments: Yes Sensation Testing Intact to touch and monofilament testing bilaterally: Yes Pulse Check Posterior Tibialis and Dorsalis pulse intact bilaterally: Yes Comments Thick toe nails      PHQ2/9: Depression screen [facility] 2/9 11/15/2020 06/26/2020 12/09/2019 09/02/2019 06/03/2019  Decreased Interest 0 0 0 0 0  Down, Depressed, Hopeless 0 0 0 0 0  PHQ - 2 Score 0 0 0 0 0  Altered sleeping - - 0 0 0  Tired, decreased energy - - 0 0 0  Change in appetite - - 0 0 0  Feeling bad or failure about yourself  - - 0 0 0  Trouble concentrating - - 0 0 0  Moving slowly or fidgety/restless - - 0 0 0  Suicidal thoughts - - 0 0 0  PHQ-9 Score - - 0 0 0  Difficult doing work/chores - - - - -  Some recent data might be hidden    phq 9  is negative   Fall Risk: Fall Risk  11/15/2020 06/26/2020 12/09/2019 09/02/2019 11/26/2018  Falls in the past year? 0 0 0 0 0  Number falls in past yr: 0 0 0 0 0  Injury with Fall? 0 0 0 0 0     Functional Status Survey: Is the patient deaf or have difficulty hearing?: No Does the patient have difficulty seeing, even when wearing glasses/contacts?: No Does the patient have difficulty concentrating, remembering, or making decisions?: No Does the patient have difficulty walking or climbing stairs?: No Does the patient have difficulty dressing or bathing?: No Does the patient have difficulty doing errands alone such as visiting a doctor's office or shopping?: No    Assessment & Plan  1. Type 2 diabetes mellitus with diabetic nephropathy, without long-term current use of insulin (HCC)  - POCT HgB A1C - insulin aspart (NOVOLOG FLEXPEN) 100 UNIT/ML FlexPen; Inject 2-12 Units into the skin 3 (three) times daily with meals. Pre-meal insulin :  Check fsbs prior to meals  Hold if below 110 If glucose 110- 140 give 4 units If glucose 140 to 180 give 6 units  If glucose 180 to 220  give 8 units If glucose above 220 give 10 units If glucose above 220 use 12 units  Dispense: 15 mL; Refill: 2 - lisinopril (ZESTRIL) 2.5 MG tablet; Take 1 tablet (2.5 mg total) by mouth daily.  Dispense: 90 tablet; Refill: 1 - XIGDUO XR 08-998 MG TB24; Take 1 tablet by mouth in the morning and at bedtime.  Dispense: 180 tablet; Refill: 1 - insulin degludec (TRESIBA FLEXTOUCH) 100 UNIT/ML FlexTouch Pen; Inject 10-50 Units into the skin daily.  Dispense: 15 mL; Refill: 0 - Dulaglutide (TRULICITY) 6.30 ZS/0.1UX SOPN; Inject 0.75 mg into the skin once a week.  Dispense: 6 mL; Refill: 0  2. NASH (nonalcoholic steatohepatitis)   3. Dyslipidemia associated with type 2  diabetes mellitus (HCC)  - icosapent Ethyl (VASCEPA) 1 g capsule; TAKE 2 CAPSULES BY MOUTH 2 (TWO) TIMES DAILY.  Dispense: 360 capsule; Refill: 1  4.  Arteriosclerosis of coronary artery   5. Coronary artery disease of native artery of native heart with stable angina pectoris (HCC)  - lisinopril (ZESTRIL) 2.5 MG tablet; Take 1 tablet (2.5 mg total) by mouth daily.  Dispense: 90 tablet; Refill: 1 - metoprolol succinate (TOPROL-XL) 25 MG 24 hr tablet; Take 0.5 tablets (12.5 mg total) by mouth daily.  Dispense: 45 tablet; Refill: 1  6. Gastro-esophageal reflux disease without esophagitis  - pantoprazole (PROTONIX) 40 MG tablet; Take 1 tablet (40 mg total) by mouth in the morning.  Dispense: 90 tablet; Refill: 1 - famotidine (PEPCID) 40 MG tablet; Take 1 tablet (40 mg total) by mouth at bedtime.  Dispense: 90 tablet; Refill: 1  7. OSA (obstructive sleep apnea)   8. Benign hypertension  - lisinopril (ZESTRIL) 2.5 MG tablet; Take 1 tablet (2.5 mg total) by mouth daily.  Dispense: 90 tablet; Refill: 1 - metoprolol succinate (TOPROL-XL) 25 MG 24 hr tablet; Take 0.5 tablets (12.5 mg total) by mouth daily.  Dispense: 45 tablet; Refill: 1  9. BPH associated with nocturia   10. Need for shingles vaccine  Refused

## 2020-11-15 ENCOUNTER — Other Ambulatory Visit: Payer: Self-pay | Admitting: Family Medicine

## 2020-11-15 ENCOUNTER — Ambulatory Visit: Payer: 59 | Admitting: Family Medicine

## 2020-11-15 ENCOUNTER — Encounter: Payer: Self-pay | Admitting: Family Medicine

## 2020-11-15 ENCOUNTER — Other Ambulatory Visit: Payer: Self-pay

## 2020-11-15 VITALS — BP 120/78 | HR 72 | Temp 97.9°F | Resp 16 | Ht 68.0 in | Wt 206.0 lb

## 2020-11-15 DIAGNOSIS — E1169 Type 2 diabetes mellitus with other specified complication: Secondary | ICD-10-CM

## 2020-11-15 DIAGNOSIS — I25118 Atherosclerotic heart disease of native coronary artery with other forms of angina pectoris: Secondary | ICD-10-CM

## 2020-11-15 DIAGNOSIS — I2510 Atherosclerotic heart disease of native coronary artery without angina pectoris: Secondary | ICD-10-CM

## 2020-11-15 DIAGNOSIS — E1121 Type 2 diabetes mellitus with diabetic nephropathy: Secondary | ICD-10-CM | POA: Diagnosis not present

## 2020-11-15 DIAGNOSIS — I10 Essential (primary) hypertension: Secondary | ICD-10-CM

## 2020-11-15 DIAGNOSIS — K219 Gastro-esophageal reflux disease without esophagitis: Secondary | ICD-10-CM

## 2020-11-15 DIAGNOSIS — Z23 Encounter for immunization: Secondary | ICD-10-CM

## 2020-11-15 DIAGNOSIS — E785 Hyperlipidemia, unspecified: Secondary | ICD-10-CM

## 2020-11-15 DIAGNOSIS — E7800 Pure hypercholesterolemia, unspecified: Secondary | ICD-10-CM

## 2020-11-15 DIAGNOSIS — G4733 Obstructive sleep apnea (adult) (pediatric): Secondary | ICD-10-CM

## 2020-11-15 LAB — POCT GLYCOSYLATED HEMOGLOBIN (HGB A1C): Hemoglobin A1C: 10.7 % — AB (ref 4.0–5.6)

## 2020-11-15 MED ORDER — ATORVASTATIN CALCIUM 80 MG PO TABS: 80.0000 mg | ORAL_TABLET | Freq: Every day | ORAL | 1 refills | Status: DC

## 2020-11-15 MED ORDER — NOVOLOG FLEXPEN 100 UNIT/ML ~~LOC~~ SOPN: 2.0000 [IU] | PEN_INJECTOR | Freq: Three times a day (TID) | SUBCUTANEOUS | 2 refills | Status: DC

## 2020-11-15 MED ORDER — FAMOTIDINE 40 MG PO TABS: 40.0000 mg | ORAL_TABLET | Freq: Every day | ORAL | 1 refills | Status: DC

## 2020-11-15 MED ORDER — PANTOPRAZOLE SODIUM 40 MG PO TBEC: 40.0000 mg | DELAYED_RELEASE_TABLET | Freq: Every morning | ORAL | 1 refills | Status: DC

## 2020-11-15 MED ORDER — TRESIBA FLEXTOUCH 100 UNIT/ML ~~LOC~~ SOPN: 10.0000 [IU] | PEN_INJECTOR | Freq: Every day | SUBCUTANEOUS | 0 refills | Status: DC

## 2020-11-15 MED ORDER — EZETIMIBE 10 MG PO TABS: 10.0000 mg | ORAL_TABLET | Freq: Every day | ORAL | 1 refills | Status: DC

## 2020-11-15 MED ORDER — METOPROLOL SUCCINATE ER 25 MG PO TB24: 12.5000 mg | ORAL_TABLET | Freq: Every day | ORAL | 1 refills | Status: DC

## 2020-11-15 MED ORDER — XIGDUO XR 5-1000 MG PO TB24: 1.0000 | ORAL_TABLET | Freq: Two times a day (BID) | ORAL | 1 refills | Status: DC

## 2020-11-15 MED ORDER — LISINOPRIL 2.5 MG PO TABS: 2.5000 mg | ORAL_TABLET | Freq: Every day | ORAL | 1 refills | Status: DC

## 2020-11-15 MED ORDER — ICOSAPENT ETHYL 1 G PO CAPS: ORAL_CAPSULE | ORAL | 1 refills | Status: DC

## 2020-11-15 MED ORDER — TRULICITY 0.75 MG/0.5ML ~~LOC~~ SOAJ: 0.7500 mg | SUBCUTANEOUS | 0 refills | Status: DC

## 2020-11-15 NOTE — Addendum Note (Signed)
Addended by: Steele Sizer F on: 11/15/2020 10:53 AM   Modules accepted: Orders

## 2020-11-16 NOTE — Telephone Encounter (Signed)
PA started

## 2020-11-16 NOTE — Telephone Encounter (Signed)
Requested medications are due for refill today yes  Requested medications are on the active medication list yes  Last refill 07/09/20  Last visit yesterday, 8/11  Future visit scheduled 02/2021  Notes to clinic There is not a Received by pharmacy note on this one med. Please assess.

## 2020-11-18 ENCOUNTER — Other Ambulatory Visit: Payer: Self-pay | Admitting: Family Medicine

## 2020-11-18 DIAGNOSIS — E1121 Type 2 diabetes mellitus with diabetic nephropathy: Secondary | ICD-10-CM

## 2020-11-18 NOTE — Telephone Encounter (Signed)
Med not covered not insurance    Name from pharmacy: XIGDUO XR 5 MG-1,000 MG TABLET        Will file in chart as: XIGDUO XR 08-998 MG GA89    Possible duplicate: Hover to review recent actions on this medication   Sig: TAKE 1 TABLET BY MOUTH IN THE MORNING AND IN THE EVENING   Disp:  180 tablet    Refills:  1    DAW    Start: 11/18/2020   Class: Normal   Non-formulary For: Type 2 diabetes mellitus with diabetic nephropathy, without long-term current use of insulin ([place])   Last ordered: 3 days ago by Steele Sizer, MD Last refill: 11/18/2020   Rx #: [id number]   Pharmacy comment: Alternative Requested:PRODUCT NOT COVERED BY INSURANCE.   Endocrinology:  Diabetes - Biguanide + SGLT2 Inhibitor Combos Failed 11/18/2020 02:31 PM  Protocol Details  HBA1C is between 0 and 7.9 and within 180 days   Cr in normal range and within 360 days   LDL in normal range and within 360 days   eGFR in normal range and within 360 days   Valid encounter within last 6 months    This request has changes from the previous prescription.  To be filled at: [id number] [place] [place]

## 2020-11-19 NOTE — Telephone Encounter (Signed)
Copied from [place] [id number]. Topic: General - Other >> Nov 19, 2020  9:43 AM Leward Quan A wrote: Reason for CRM: Patient called in to inform Dr Ancil Boozer that his insurance will not pay for some medication and need prior authorization icosapent Ethyl (VASCEPA) 1 g capsule,insulin aspart (NOVOLOG FLEXPEN) 100 UNIT/ML FlexPen, and XIGDUO XR 08-998 MG TB24 say that Dr Ancil Boozer need to call Ph# [phone]

## 2020-12-10 ENCOUNTER — Other Ambulatory Visit: Payer: Self-pay | Admitting: Family Medicine

## 2020-12-10 DIAGNOSIS — E1121 Type 2 diabetes mellitus with diabetic nephropathy: Secondary | ICD-10-CM

## 2020-12-15 ENCOUNTER — Other Ambulatory Visit: Payer: Self-pay

## 2020-12-15 ENCOUNTER — Ambulatory Visit: Payer: Self-pay

## 2020-12-15 ENCOUNTER — Ambulatory Visit
Admission: EM | Admit: 2020-12-15 | Discharge: 2020-12-15 | Disposition: A | Discharge status: Home / Self Care | Payer: 59 | Attending: Family Medicine | Admitting: Family Medicine

## 2020-12-15 ENCOUNTER — Encounter: Payer: Self-pay | Admitting: Emergency Medicine

## 2020-12-15 ENCOUNTER — Ambulatory Visit (INDEPENDENT_AMBULATORY_CARE_PROVIDER_SITE_OTHER): Payer: 59

## 2020-12-15 DIAGNOSIS — M25562 Pain in left knee: Secondary | ICD-10-CM

## 2020-12-15 DIAGNOSIS — M1712 Unilateral primary osteoarthritis, left knee: Secondary | ICD-10-CM | POA: Diagnosis not present

## 2020-12-15 MED ORDER — MELOXICAM 15 MG PO TABS: 15.0000 mg | ORAL_TABLET | Freq: Every day | ORAL | 0 refills | Status: DC | PRN

## 2020-12-15 NOTE — Discharge Instructions (Signed)
Rest  Medication as prescribed.  Take care  Dr. Lacinda Axon

## 2020-12-15 NOTE — ED Provider Notes (Signed)
[facility]    CSN: [id number] Arrival date & time: 12/15/20  1057      History   Chief Complaint Chief Complaint  Patient presents with   Knee Pain    left   Appointment    HPI 60 year old male presents with left knee pain.  Left knee pain started yesterday. No fall, trauma, injury. Worse with activity. No relieving factors. No reports of swelling. No history of joint pain. States his pain is severe. No other associated symptoms. No other complaints.   Past Medical History:  Diagnosis Date   Allergy    Anxiety    COPD (chronic obstructive pulmonary disease) ([place])    Diabetes mellitus without complication ([place])    Hyperlipidemia    Hypertension    Male impotence    MI (myocardial infarction) ([place])    Obesity    PNA (pneumonia)     Patient Active Problem List   Diagnosis Date Noted   Coronary artery disease of native artery of native heart with stable angina pectoris ([place]) 11/26/2018   OSA (obstructive sleep apnea) 05/21/2015   Perennial allergic rhinitis 11/07/2014   Arteriosclerosis of coronary artery 11/07/2014   CD (contact dermatitis) 11/07/2014   Decreased libido 11/07/2014   Diabetes mellitus with renal manifestation ([place]) 11/07/2014   Gastro-esophageal reflux disease without esophagitis 11/07/2014   H/O acute myocardial infarction 11/07/2014   Hypercholesteremia 11/07/2014   Benign hypertension 11/07/2014   Hypertriglyceridemia 11/07/2014   H/O high risk medication treatment 11/07/2014   NASH (nonalcoholic steatohepatitis) 11/07/2014   Microalbuminuria 11/07/2014   Adult BMI 30+ 11/07/2014   Fungal infection of toenail 11/07/2014   Tobacco abuse 11/07/2014   ED (erectile dysfunction) of organic origin 09/29/2006    Past Surgical History:  Procedure Laterality Date   cadiac stenting     CYSTECTOMY     60 years of age   KNEE ARTHROSCOPY         Home Medications    Prior to Admission medications   Medication Sig Start Date End  Date Taking? Authorizing Provider  atorvastatin (LIPITOR) 80 MG tablet Take 1 tablet (80 mg total) by mouth daily at 6 PM. 11/15/20  Yes Sowles, Drue Stager, MD  clopidogrel (PLAVIX) 75 MG tablet TAKE 1 TABLET BY MOUTH EVERY DAY 07/09/20  Yes Sowles, Drue Stager, MD  ezetimibe (ZETIA) 10 MG tablet Take 1 tablet (10 mg total) by mouth daily. 11/15/20  Yes Sowles, Drue Stager, MD  famotidine (PEPCID) 40 MG tablet Take 1 tablet (40 mg total) by mouth at bedtime. 11/15/20  Yes Sowles, Drue Stager, MD  fluticasone (FLONASE) 50 MCG/ACT nasal spray Place 2 sprays into both nostrils as needed. 02/25/19  Yes Sowles, Drue Stager, MD  icosapent Ethyl (VASCEPA) 1 g capsule TAKE 2 CAPSULES BY MOUTH 2 (TWO) TIMES DAILY. 11/15/20  Yes Sowles, Drue Stager, MD  insulin aspart (NOVOLOG FLEXPEN) 100 UNIT/ML FlexPen Inject 2-12 Units into the skin 3 (three) times daily with meals. Pre-meal insulin :  Check fsbs prior to meals  Hold if below 110 If glucose 110- 140 give 4 units If glucose 140 to 180 give 6 units  If glucose 180 to 220  give 8 units If glucose above 220 give 10 units If glucose above 220 use 12 units 11/15/20  Yes Sowles, Drue Stager, MD  lisinopril (ZESTRIL) 2.5 MG tablet Take 1 tablet (2.5 mg total) by mouth daily. 11/15/20  Yes Sowles, Drue Stager, MD  loratadine (CLARITIN) 10 MG tablet Take 1 tablet by mouth daily. 08/09/12  Yes  [provider]  meloxicam (MOBIC) 15 MG tablet Take 1 tablet (15 mg total) by mouth daily as needed for pain. 12/15/20  Yes Lyndell Gillyard G, DO  metaxalone (SKELAXIN) 800 MG tablet TAKE 1 TABLET BY MOUTH THREE TIMES A DAY 01/12/20  Yes Sowles, Drue Stager, MD  metoprolol succinate (TOPROL-XL) 25 MG 24 hr tablet Take 0.5 tablets (12.5 mg total) by mouth daily. 11/15/20  Yes Sowles, Drue Stager, MD  pantoprazole (PROTONIX) 40 MG tablet Take 1 tablet (40 mg total) by mouth in the morning. 11/15/20  Yes Sowles, Drue Stager, MD  TRULICITY 6.55 VZ/4.8OL SOPN INJECT 0.75 MG INTO THE SKIN ONCE A WEEK. 11/16/20  Yes Sowles,  Drue Stager, MD  XIGDUO XR 08-998 MG TB24 TAKE 1 TABLET BY MOUTH IN THE MORNING AND IN THE EVENING 11/21/20  Yes Sowles, Drue Stager, MD  Glucagon (GVOKE HYPOPEN 1-PACK) 1 MG/0.2ML SOAJ Inject 1 each into the skin daily as needed. If glucose very low and unable to eat/drink 06/26/20   Steele Sizer, MD  Insulin Pen Needle (BD PEN NEEDLE NANO 2ND GEN) 32G X 4 MM MISC Inject 1 each into the skin in the morning and at bedtime. 06/26/20   Steele Sizer, MD  LANTUS SOLOSTAR 100 UNIT/ML Solostar Pen INJECT 20-50 UNITS INTO SKIN DAILY. 2 UNITS EVERY 3 DAYS TO KEEP FASTING BETWEEN 100-140 (MAX 50/DAY 12/10/20   Steele Sizer, MD  [facility] ULTRA test strip USE AS DIRECTED 08/07/20   Steele Sizer, MD  sildenafil (VIAGRA) 100 MG tablet TAKE ONE-HALF TO ONE TABLET BY MOUTH DAILY AS NEEDED FOR ERECTILE DYSFUNCTION. 10/13/20   Steele Sizer, MD  triamcinolone cream (KENALOG) 0.1 % Apply 1 application topically 2 (two) times daily. 08/05/20   Zigmund Gottron, NP    Family History Family History  Problem Relation Age of Onset   Diabetes Mother    CAD Mother    Heart disease Mother    Heart attack Mother    Heart disease Father    Heart attack Father     Social History Social History   Tobacco Use   Smoking status: Former    Packs/day: 1.50    Years: 30.00    Pack years: 45.00    Types: Cigarettes    Quit date: 04/19/2013    Years since quitting: 7.6   Smokeless tobacco: Current  Vaping Use   Vaping Use: Former  Substance Use Topics   Alcohol use: No    Alcohol/week: 0.0 standard drinks    Comment: occ   Drug use: No     Allergies   Patient has no known allergies.   Review of Systems Review of Systems  Constitutional: Negative.   Musculoskeletal:        Left knee pain.    Physical Exam Triage Vital Signs ED Triage Vitals  Enc Vitals Group     BP 12/15/20 1116 137/84     Pulse Rate 12/15/20 1116 96     Resp 12/15/20 1116 16     Temp 12/15/20 1116 98.4 F (36.9 C)     Temp Source  12/15/20 1116 Oral     SpO2 12/15/20 1116 97 %     Weight 12/15/20 1113 210 lb (95.3 kg)     Height 12/15/20 1113 5' 8"  (1.727 m)     Head Circumference --      Peak Flow --      Pain Score 12/15/20 1112 10     Pain Loc --      Pain Edu? --  Excl. in GC? --    No data found.  Updated Vital Signs BP 137/84 (BP Location: Left Arm)   Pulse 96   Temp 98.4 F (36.9 C) (Oral)   Resp 16   Ht 5' 8"  (1.727 m)   Wt 95.3 kg   SpO2 97%   BMI 31.93 kg/m   Visual Acuity Right Eye Distance:   Left Eye Distance:   Bilateral Distance:    Right Eye Near:   Left Eye Near:    Bilateral Near:     Physical Exam Vitals and nursing note reviewed.  Constitutional:      General: He is not in acute distress.    Appearance: Normal appearance.  HENT:     Head: Normocephalic and atraumatic.  Eyes:     General:        Right eye: No discharge.        Left eye: No discharge.     Conjunctiva/sclera: Conjunctivae normal.  Pulmonary:     Effort: Pulmonary effort is normal. No respiratory distress.  Musculoskeletal:     Comments: Left knee: Normal to inspection with no erythema or effusion or obvious bony abnormalities.  Palpation normal with no warmth, joint line tenderness, patellar tenderness, or condyle tenderness. ROM full in flexion and extension and lower leg rotation. Ligaments with solid consistent endpoints including ACL, PCL, LCL, MCL. Patellar and quadriceps tendons unremarkable.     Neurological:     Mental Status: He is alert.     UC Treatments / Results  Labs (all labs ordered are listed, but only abnormal results are displayed) Labs Reviewed - No data to display  EKG   Radiology DG Knee Complete 4 Views Left  Result Date: 12/15/2020 CLINICAL DATA:  Left knee pain starting yesterday.  No injury. EXAM: LEFT KNEE - COMPLETE 4+ VIEW COMPARISON:  None. FINDINGS: No evidence of fracture, dislocation, or joint effusion. No mild tibial spine osteophytosis is  identified. Mild narrowing of the medial femoral tibial joint space is noted. Soft tissues are unremarkable. IMPRESSION: Mild degenerative joint changes of left knee. No acute fracture or dislocation. Electronically Signed   By: Abelardo Diesel M.D.   On: 12/15/2020 11:50    Procedures Procedures (including critical care time)  Medications Ordered in UC Medications - No data to display  Initial Impression / Assessment and Plan / UC Course  I have reviewed the triage vital signs and the nursing notes.  Pertinent labs & imaging results that were available during my care of the patient were reviewed by me and considered in my medical decision making (see chart for details).    60 year old male presents with left knee pain. Xray obtained and independently reviewed by me. Interpretation: Mild degenerative changes. Treating with Mobic. Supportive care.   Final Clinical Impressions(s) / UC Diagnoses   Final diagnoses:  Acute pain of left knee  Primary osteoarthritis of left knee     Discharge Instructions      Rest  Medication as prescribed.  Take care  Dr. Lacinda Axon    ED Prescriptions     Medication Sig Dispense Auth. Provider   meloxicam (MOBIC) 15 MG tablet Take 1 tablet (15 mg total) by mouth daily as needed for pain. 30 tablet Coral Spikes, DO      PDMP not reviewed this encounter.   Coral Spikes, [place] 12/15/20 [id number]

## 2020-12-15 NOTE — ED Triage Notes (Signed)
Patient c/o left knee pain that started yesterday afternoon.  Patient denies injury or fall.

## 2021-01-23 ENCOUNTER — Other Ambulatory Visit: Payer: Self-pay | Admitting: Family Medicine

## 2021-01-23 DIAGNOSIS — N529 Male erectile dysfunction, unspecified: Secondary | ICD-10-CM

## 2021-02-04 ENCOUNTER — Ambulatory Visit: Payer: 59 | Admitting: Nurse Practitioner

## 2021-02-04 ENCOUNTER — Ambulatory Visit: Payer: Self-pay | Admitting: *Deleted

## 2021-02-04 ENCOUNTER — Other Ambulatory Visit: Payer: Self-pay

## 2021-02-04 ENCOUNTER — Encounter: Payer: Self-pay | Admitting: Nurse Practitioner

## 2021-02-04 ENCOUNTER — Other Ambulatory Visit (HOSPITAL_COMMUNITY)
Admission: RE | Admit: 2021-02-04 | Discharge: 2021-02-04 | Disposition: A | Discharge status: Home / Self Care | Payer: 59 | Source: Ambulatory Visit | Attending: Family Medicine | Admitting: Family Medicine

## 2021-02-04 VITALS — BP 132/72 | HR 90 | Temp 98.1°F | Resp 16 | Wt 217.6 lb

## 2021-02-04 DIAGNOSIS — I10 Essential (primary) hypertension: Secondary | ICD-10-CM

## 2021-02-04 DIAGNOSIS — R591 Generalized enlarged lymph nodes: Secondary | ICD-10-CM | POA: Diagnosis present

## 2021-02-04 DIAGNOSIS — E1169 Type 2 diabetes mellitus with other specified complication: Secondary | ICD-10-CM

## 2021-02-04 DIAGNOSIS — E785 Hyperlipidemia, unspecified: Secondary | ICD-10-CM

## 2021-02-04 LAB — POCT URINALYSIS DIPSTICK
Bilirubin, UA: NEGATIVE
Blood, UA: NEGATIVE
Glucose, UA: POSITIVE — AB
Ketones, UA: NEGATIVE
Leukocytes, UA: NEGATIVE
Nitrite, UA: NEGATIVE
Odor: NORMAL
Protein, UA: NEGATIVE
Spec Grav, UA: 1.015 (ref 1.010–1.025)
Urobilinogen, UA: 0.2 E.U./dL
pH, UA: 5.5 (ref 5.0–8.0)

## 2021-02-04 MED ORDER — DOXYCYCLINE HYCLATE 100 MG PO TABS: 100.0000 mg | ORAL_TABLET | Freq: Two times a day (BID) | ORAL | 0 refills | Status: AC

## 2021-02-04 NOTE — Telephone Encounter (Signed)
Patient experiencing groin pain for 4days and states when he gets a erection is hurts worst. Patient would like to see PCP only. PCP has no available appointments      Reason for Disposition  All other penis - scrotum symptoms  (Exception: painless rash < 24 hours duration)  Answer Assessment - Initial Assessment Questions 1. SYMPTOM: "What's the main symptom you're concerned about?" (e.g., discharge from penis, rash, pain, itching, swelling)     Right groin pain 2. LOCATION: "Where is the *No Answer* located?"     Right groin 3. ONSET: "When start?"     1 month, worsening Friday 4. PAIN: "Is there any pain?" If Yes, ask: "How bad is it?"  (Scale 1-10; or mild, moderate, severe)     Yes 2-3/10 constant, 8-9/10 with palpation, going from sitting to standing 5. URINE: "Any difficulty passing urine?" If Yes, ask: "When was the last time?"     No, "Slower stream than usual."  H/O enlarged prostate. 6. CAUSE: "What do you think is causing the symptoms?"     unsure 7. OTHER SYMPTOMS: "Do you have any other symptoms?" (e.g., fever, abdominal pain, blood in urine)     Knot size of pea ,oblong shaped.   Inch long.  Protocols used: Penis and Scrotum Symptoms-A-AH

## 2021-02-04 NOTE — Progress Notes (Signed)
BP 132/72   Pulse 90   Temp 98.1 F (36.7 C)   Resp 16   Wt 217 lb 9.6 oz (98.7 kg)   SpO2 97%   BMI 33.09 kg/m    Subjective:    Patient ID: Jesse Lawrence., male    DOB: 1960/09/18, 60 y.o.   MRN: [id number]  HPI: Jesse Lawrence. is a 60 y.o. male, her alone  Chief Complaint  Patient presents with   Groin Pain    Feels lump on right side   Right inguinal area pain/lump: Pain for at least a month.  Pain increased on Friday, he does not recall an aggravating event.  He describes the pain as sharp 5/10. He says the pain is worse when he gets an erection. He also notices the pain when he stands up or sits down. It is tender on palpation. He denies any concern for STI.  He does notice pressure when urinating.  He says the pain radiates to his right testicle.  Testicle is not tender to the touch, but pain is felt in right groin when testicle palpated.  No nodules noted on testicle.  He denies any penile discharge.  He denies any fever or chills.  Will get labs, start antibiotics, if no improvement will get imaging. Patient is agreeable to plan.  HTN: blood pressure is 132/72 today.  He is currently taking lisinopril 2.5 mg daily and metoprolol 12.5 mg daily.  He denies any chest pain or shortness of breath.  He denies any vision changes or headaches.   Dyslipidemia:  Last LDL was 42. Triglycerides were 181.  He has been taking  atorvastatin 80 mg daily.  He denies any myalgia. Will recheck labs today.  Relevant past medical, surgical, family and social history reviewed and updated as indicated. Interim medical history since our last visit reviewed. Allergies and medications reviewed and updated.  Review of Systems  Constitutional: Negative for fever or weight change.  Respiratory: Negative for cough and shortness of breath.   Cardiovascular: Negative for chest pain or palpitations.  Gastrointestinal: Negative for abdominal pain, no bowel changes.  Genitourinary:  Positive  for right groin pain Musculoskeletal: Negative for gait problem or joint swelling.  Skin: Negative for rash.  Neurological: Negative for dizziness or headache.  No other specific complaints in a complete review of systems (except as listed in HPI above).     Objective:    BP 132/72   Pulse 90   Temp 98.1 F (36.7 C)   Resp 16   Wt 217 lb 9.6 oz (98.7 kg)   SpO2 97%   BMI 33.09 kg/m   Wt Readings from Last 3 Encounters:  02/04/21 217 lb 9.6 oz (98.7 kg)  12/15/20 210 lb (95.3 kg)  11/15/20 206 lb (93.4 kg)    Physical Exam  Constitutional: Patient appears well-developed and well-nourished. No distress.  HENT: Head: Normocephalic and atraumatic Eyes: Conjunctivae and EOM are normal. Pupils are equal, round, and reactive to light. No scleral icterus.  Neck: Normal range of motion. Neck supple. No JVD present. No thyromegaly present.  Cardiovascular: Normal rate, regular rhythm and normal heart sounds.  No murmur heard. No BLE edema. Pulmonary/Chest: Effort normal and breath sounds normal. No respiratory distress. Abdominal: Soft. Bowel sounds are normal, no distension. There is no tenderness. no masses MALE GENITALIA: Normal descended testes bilaterally,no masses noted on testicles, no lesions, no discharge Right Groin: Kieth Brightly sized lump noted in right groin, tenderness noted  Musculoskeletal: Normal range of motion, no joint effusions. No gross deformities Neurological: he is alert and oriented to person, place, and time. No cranial nerve deficit. Coordination, balance, strength, speech and gait are normal.  Skin: Skin is warm and dry. No rash noted. No erythema.  Psychiatric: Patient has a normal mood and affect. behavior is normal. Judgment and thought content normal.   Results for orders placed or performed in visit on 02/04/21  POCT urinalysis dipstick  Result Value Ref Range   Color, UA yellow    Clarity, UA clear    Glucose, UA Positive (A) Negative   Bilirubin, UA neg     Ketones, UA neg    Spec Grav, UA 1.015 1.010 - 1.025   Blood, UA neg    pH, UA 5.5 5.0 - 8.0   Protein, UA Negative Negative   Urobilinogen, UA 0.2 0.2 or 1.0 E.U./dL   Nitrite, UA neg    Leukocytes, UA Negative Negative   Appearance clear    Odor normal       Assessment & Plan:   1. Lymphadenopathy  - CBC with Differential/Platelet - C-reactive protein - Sedimentation rate - POCT urinalysis dipstick - Urine cytology ancillary only - doxycycline (VIBRA-TABS) 100 MG tablet; Take 1 tablet (100 mg total) by mouth 2 (two) times daily for 10 days.  Dispense: 20 tablet; Refill: 0 - if no improvement after antibiotics, will get imaging  2. Benign hypertension  - CBC with Differential/Platelet - COMPLETE METABOLIC PANEL WITH GFR  3. Dyslipidemia associated with type 2 diabetes mellitus ([place])  - Lipid panel   Follow up plan: Return if symptoms worsen or fail to improve.  Has appointment with Dr Ancil Boozer on 02/22/21.

## 2021-02-04 NOTE — Telephone Encounter (Signed)
Pt reports right sided groin pain. Reports onset 1 month ago, worsening since Friday. Constant at 2-3/10, 8-9/10 with palpation, going from sitting to standing. States also worse with erection and radiates to right testicle at times.  Denies swelling, no redness or warmth. States can palpate "Oblong knot, size of pea." Also reports urine stream "Slower than usual." H/O enlarged prostate. Denies dysuria.  Called practice, Suanne Marker, for consult, transferred pt for scheduling.

## 2021-02-05 LAB — CBC WITH DIFFERENTIAL/PLATELET
Absolute Monocytes: 850 cells/uL (ref 200–950)
Basophils Absolute: 43 cells/uL (ref 0–200)
Basophils Relative: 0.5 %
Eosinophils Absolute: 77 cells/uL (ref 15–500)
Eosinophils Relative: 0.9 %
HCT: 47.5 % (ref 38.5–50.0)
Hemoglobin: 16.3 g/dL (ref 13.2–17.1)
Lymphs Abs: 2788 cells/uL (ref 850–3900)
MCH: 31.9 pg (ref 27.0–33.0)
MCHC: 34.3 g/dL (ref 32.0–36.0)
MCV: 93 fL (ref 80.0–100.0)
MPV: 10.8 fL (ref 7.5–12.5)
Monocytes Relative: 10 %
Neutro Abs: 4743 cells/uL (ref 1500–7800)
Neutrophils Relative %: 55.8 %
Platelets: 284 10*3/uL (ref 140–400)
RBC: 5.11 10*6/uL (ref 4.20–5.80)
RDW: 12.2 % (ref 11.0–15.0)
Total Lymphocyte: 32.8 %
WBC: 8.5 10*3/uL (ref 3.8–10.8)

## 2021-02-05 LAB — COMPLETE METABOLIC PANEL WITH GFR
AG Ratio: 1.9 (calc) (ref 1.0–2.5)
ALT: 20 U/L (ref 9–46)
AST: 16 U/L (ref 10–35)
Albumin: 4.9 g/dL (ref 3.6–5.1)
Alkaline phosphatase (APISO): 56 U/L (ref 35–144)
BUN: 14 mg/dL (ref 7–25)
CO2: 25 mmol/L (ref 20–32)
Calcium: 9.9 mg/dL (ref 8.6–10.3)
Chloride: 102 mmol/L (ref 98–110)
Creat: 0.72 mg/dL (ref 0.70–1.30)
Globulin: 2.6 g/dL (calc) (ref 1.9–3.7)
Glucose, Bld: 99 mg/dL (ref 65–99)
Potassium: 4.3 mmol/L (ref 3.5–5.3)
Sodium: 139 mmol/L (ref 135–146)
Total Bilirubin: 1.3 mg/dL — ABNORMAL HIGH (ref 0.2–1.2)
Total Protein: 7.5 g/dL (ref 6.1–8.1)
eGFR: 106 mL/min/{1.73_m2} (ref >=60)

## 2021-02-05 LAB — LIPID PANEL
Cholesterol: 119 mg/dL (ref <200)
HDL: 36 mg/dL — ABNORMAL LOW (ref >=40)
LDL Cholesterol (Calc): 60 mg/dL (calc)
Non-HDL Cholesterol (Calc): 83 mg/dL (calc) (ref <130)
Total CHOL/HDL Ratio: 3.3 (calc) (ref <5.0)
Triglycerides: 148 mg/dL (ref <150)

## 2021-02-05 LAB — SEDIMENTATION RATE: Sed Rate: 2 mm/h (ref 0–20)

## 2021-02-05 LAB — C-REACTIVE PROTEIN: CRP: 0.7 mg/L (ref <8.0)

## 2021-02-06 LAB — URINE CYTOLOGY ANCILLARY ONLY
Bacterial Vaginitis-Urine: NEGATIVE
Candida Urine: NEGATIVE
Chlamydia: NEGATIVE
Comment: NEGATIVE
Comment: NEGATIVE
Comment: NORMAL
Neisseria Gonorrhea: NEGATIVE
Trichomonas: NEGATIVE

## 2021-02-08 ENCOUNTER — Other Ambulatory Visit: Payer: Self-pay | Admitting: Nurse Practitioner

## 2021-02-08 DIAGNOSIS — R1031 Right lower quadrant pain: Secondary | ICD-10-CM

## 2021-02-08 DIAGNOSIS — R109 Unspecified abdominal pain: Secondary | ICD-10-CM

## 2021-02-12 ENCOUNTER — Telehealth: Payer: Self-pay | Admitting: Family Medicine

## 2021-02-12 NOTE — Telephone Encounter (Signed)
Pt is calling bk as an order for a CT scan had been paced by Almyra Free per [place], 11/4.  Order is there but pt has not had any contact to schedule. Pls FU with pt at [phone] Pt is anxious , pls return call, verified phone # to make sure it was right. Pt waiting for a return call

## 2021-02-13 ENCOUNTER — Other Ambulatory Visit: Payer: Self-pay | Admitting: Family Medicine

## 2021-02-13 DIAGNOSIS — I25118 Atherosclerotic heart disease of native coronary artery with other forms of angina pectoris: Secondary | ICD-10-CM

## 2021-02-15 NOTE — Telephone Encounter (Signed)
Pt already scheduled and has been notified

## 2021-02-17 ENCOUNTER — Other Ambulatory Visit: Payer: Self-pay | Admitting: Family Medicine

## 2021-02-17 DIAGNOSIS — E1121 Type 2 diabetes mellitus with diabetic nephropathy: Secondary | ICD-10-CM

## 2021-02-21 NOTE — Progress Notes (Signed)
Name: Jesse Lawrence.   MRN: [id number]    DOB: 03/04/1961   Date:02/22/2021       Progress Note  Subjective  Chief Complaint  Follow Up  HPI  DM II :   His A1C was at goal for a long time, however had a gap of insurance and medication, A1C was up to 10.7 % but now he is back on Xigduo, lantus and Trulicity 6.23, he is tolerating medication well, A1C is down to 7.4 %, we will adjust dose of Trulicity to 1.5 mg and try to wean him off Lantus He has associated obesity, ED, HTN and dyslipidemia. Urine micro was high back in May, he is on ACE, we will recheck next visit     HTN/CAD:no palpitation, denies chest pain or decrease in SOB, on low dose ACE and half pill metoprolol , he has been taking Atorvastatin and Zetia but out of Vascepa due to pharmacy problems, we will give him another voucher today. Marland KitchenHe sees cardiologist - Dr. Rockey Situ    ED: he has difficulty maintaining and erection has been doing well on Viagra, but has pain with erection that radiates to his right testicle. He developed right groin pain over one month ago and also a lymphonodi that improved with antibiotics, but continues to have pain on right testicle with an erection and palpation, he has a CT schedule for Dec 1st to rule inguinal hernia but I will send referral to Urologist first since it seems related to testicle more than hernia    OSA: he has not been compliant with CPAP machine, he cannot tolerated it, he is aware of the importance of wearing it . Unchanged    GERD: he states symptoms have improved, he is taking medication as prescribed but if he skips he needs to take tums all day.   Obesity:weight is stable, hopefully he will lose more when we increase dose of Trulicity   Patient Active Problem List   Diagnosis Date Noted   Coronary artery disease of native artery of native heart with stable angina pectoris ([place]) 11/26/2018   OSA (obstructive sleep apnea) 05/21/2015   Perennial allergic rhinitis 11/07/2014    Arteriosclerosis of coronary artery 11/07/2014   CD (contact dermatitis) 11/07/2014   Decreased libido 11/07/2014   Diabetes mellitus with renal manifestation ([place]) 11/07/2014   Gastro-esophageal reflux disease without esophagitis 11/07/2014   H/O acute myocardial infarction 11/07/2014   Hypercholesteremia 11/07/2014   Benign hypertension 11/07/2014   Hypertriglyceridemia 11/07/2014   H/O high risk medication treatment 11/07/2014   NASH (nonalcoholic steatohepatitis) 11/07/2014   Microalbuminuria 11/07/2014   Adult BMI 30+ 11/07/2014   Fungal infection of toenail 11/07/2014   Tobacco abuse 11/07/2014   ED (erectile dysfunction) of organic origin 09/29/2006    Past Surgical History:  Procedure Laterality Date   cadiac stenting     CYSTECTOMY     61 years of age   KNEE ARTHROSCOPY      Family History  Problem Relation Age of Onset   Diabetes Mother    CAD Mother    Heart disease Mother    Heart attack Mother    Heart disease Father    Heart attack Father     Social History   Tobacco Use   Smoking status: Former    Packs/day: 1.50    Years: 30.00    Pack years: 45.00    Types: Cigarettes    Quit date: 04/19/2013    Years since quitting: 7.8  Smokeless tobacco: Current  Substance Use Topics   Alcohol use: No    Alcohol/week: 0.0 standard drinks    Comment: occ     Current Outpatient Medications:    clopidogrel (PLAVIX) 75 MG tablet, TAKE 1 TABLET BY MOUTH EVERY DAY, Disp: 90 tablet, Rfl: 0   Dulaglutide (TRULICITY) 1.5 UU/8.2CM SOPN, Inject 1.5 mg into the skin once a week., Disp: 6 mL, Rfl: 0   famotidine (PEPCID) 40 MG tablet, Take 1 tablet (40 mg total) by mouth at bedtime., Disp: 90 tablet, Rfl: 1   fluticasone (FLONASE) 50 MCG/ACT nasal spray, Place 2 sprays into both nostrils as needed., Disp: 48 g, Rfl: 1   Insulin Pen Needle (BD PEN NEEDLE NANO 2ND GEN) 32G X 4 MM MISC, Inject 1 each into the skin in the morning and at bedtime., Disp: 200 each, Rfl: 2    loratadine (CLARITIN) 10 MG tablet, Take 1 tablet by mouth daily., Disp: , Rfl:    meloxicam (MOBIC) 15 MG tablet, Take 1 tablet (15 mg total) by mouth daily as needed for pain., Disp: 30 tablet, Rfl: 0   metaxalone (SKELAXIN) 800 MG tablet, TAKE 1 TABLET BY MOUTH THREE TIMES A DAY, Disp: 90 tablet, Rfl: 2   ONETOUCH ULTRA test strip, USE AS DIRECTED, Disp: 100 strip, Rfl: 11   pantoprazole (PROTONIX) 40 MG tablet, Take 1 tablet (40 mg total) by mouth in the morning., Disp: 90 tablet, Rfl: 1   sildenafil (VIAGRA) 100 MG tablet, TAKE ONE-HALF TO ONE TABLET BY MOUTH DAILY AS NEEDED FOR FOR ERECTILE DYSFUNCTION, Disp: 30 tablet, Rfl: 0   triamcinolone cream (KENALOG) 0.1 %, Apply 1 application topically 2 (two) times daily., Disp: 30 g, Rfl: 0   XIGDUO XR 08-998 MG TB24, TAKE 1 TABLET BY MOUTH IN THE MORNING AND IN THE EVENING, Disp: 180 tablet, Rfl: 1   atorvastatin (LIPITOR) 80 MG tablet, Take 1 tablet (80 mg total) by mouth daily at 6 PM., Disp: 90 tablet, Rfl: 1   ezetimibe (ZETIA) 10 MG tablet, Take 1 tablet (10 mg total) by mouth daily., Disp: 90 tablet, Rfl: 1   Glucagon (GVOKE HYPOPEN 1-PACK) 1 MG/0.2ML SOAJ, Inject 1 each into the skin daily as needed. If glucose very low and unable to eat/drink (Patient not taking: Reported on 02/22/2021), Disp: 0.2 mL, Rfl: 1   icosapent Ethyl (VASCEPA) 1 g capsule, TAKE 2 CAPSULES BY MOUTH 2 (TWO) TIMES DAILY., Disp: 360 capsule, Rfl: 1   insulin glargine (LANTUS SOLOSTAR) 100 UNIT/ML Solostar Pen, INJECT 20-50 UNITS INTO SKIN DAILY. 2 UNITS EVERY 3 DAYS TO KEEP FASTING BETWEEN 100-140 (MAX 50/DAY, Disp: 15 mL, Rfl: 0   lisinopril (ZESTRIL) 2.5 MG tablet, Take 1 tablet (2.5 mg total) by mouth daily., Disp: 90 tablet, Rfl: 1   metoprolol succinate (TOPROL-XL) 25 MG 24 hr tablet, Take 0.5 tablets (12.5 mg total) by mouth daily., Disp: 45 tablet, Rfl: 1  Not on File  I personally reviewed active problem list, medication list, allergies, family history, social  history, health maintenance with the patient/caregiver today.   ROS  Constitutional: Negative for fever or weight change.  Respiratory: Negative for cough and shortness of breath.   Cardiovascular: Negative for chest pain or palpitations.  Gastrointestinal: Negative for abdominal pain, no bowel changes.  Musculoskeletal: Negative for gait problem or joint swelling.  Skin: Negative for rash.  Neurological: Negative for dizziness or headache.  No other specific complaints in a complete review of systems (except as listed in  HPI above).   Objective  Vitals:   02/22/21 0924  BP: 132/82  Pulse: 93  Resp: 18  Temp: 97.8 F (36.6 C)  TempSrc: Oral  SpO2: 95%  Weight: 219 lb 9.6 oz (99.6 kg)  Height: _0  (1.727 m)    Body mass index is 33.39 kg/m.  Physical Exam  Constitutional: Patient appears well-developed and well-nourished. Obese  No distress.  HEENT: head atraumatic, normocephalic, pupils equal and reactive to light, neck supple Cardiovascular: Normal rate, regular rhythm and normal heart sounds.  No murmur heard. No BLE edema. Pulmonary/Chest: Effort normal and breath sounds normal. No respiratory distress. Abdominal: Soft.  There is no tenderness. Psychiatric: Patient has a normal mood and affect. behavior is normal. Judgment and thought content normal.   Recent Results (from the past 2160 hour(s))  Urine cytology ancillary only     Status: None   Collection Time: 02/04/21  1:34 PM  Result Value Ref Range   Neisseria Gonorrhea Negative    Chlamydia Negative    Trichomonas Negative    Bacterial Vaginitis-Urine Negative    Candida Urine Negative    Molecular Comment      This specimen does not meet the strict criteria set by the FDA. The   Molecular Comment      result interpretation should be considered in conjunction with the   Molecular Comment patient's clinical history.    Comment Normal Reference Range Trichomonas - Negative    Comment Normal Reference  [place] Chlamydia - Negative    Comment      Normal Reference Range Neisseria Gonorrhea - Negative  POCT urinalysis dipstick     Status: Abnormal   Collection Time: 02/04/21  1:38 PM  Result Value Ref Range   Color, UA yellow    Clarity, UA clear    Glucose, UA Positive (A) Negative   Bilirubin, UA neg    Ketones, UA neg    Spec Grav, UA 1.015 1.010 - 1.025   Blood, UA neg    pH, UA 5.5 5.0 - 8.0   Protein, UA Negative Negative   Urobilinogen, UA 0.2 0.2 or 1.0 E.U./dL   Nitrite, UA neg    Leukocytes, UA Negative Negative   Appearance clear    Odor normal   CBC with Differential/Platelet     Status: None   Collection Time: 02/04/21  1:45 PM  Result Value Ref Range   WBC 8.5 3.8 - 10.8 Thousand/uL   RBC 5.11 4.20 - 5.80 Million/uL   Hemoglobin 16.3 13.2 - 17.1 g/dL   HCT 47.5 38.5 - 50.0 %   MCV 93.0 80.0 - 100.0 fL   MCH 31.9 27.0 - 33.0 pg   MCHC 34.3 32.0 - 36.0 g/dL   RDW 12.2 11.0 - 15.0 %   Platelets 284 140 - 400 Thousand/uL   MPV 10.8 7.5 - 12.5 fL   Neutro Abs 4,743 1,500 - 7,800 cells/uL   Lymphs Abs 2,788 850 - 3,900 cells/uL   Absolute Monocytes 850 200 - 950 cells/uL   Eosinophils Absolute 77 15 - 500 cells/uL   Basophils Absolute 43 0 - 200 cells/uL   Neutrophils Relative % 55.8 %   Total Lymphocyte 32.8 %   Monocytes Relative 10.0 %   Eosinophils Relative 0.9 %   Basophils Relative 0.5 %  COMPLETE METABOLIC PANEL WITH GFR     Status: Abnormal   Collection Time: 02/04/21  1:45 PM  Result Value Ref Range   Glucose, Bld 99 65 -  99 mg/dL    Comment: .            Fasting reference interval .    BUN 14 7 - 25 mg/dL   Creat 0.72 0.70 - 1.30 mg/dL   eGFR 106 > OR = 60 mL/min/1.70m    Comment: The eGFR is based on the CKD-EPI 2021 equation. To calculate  the new eGFR from a previous Creatinine or Cystatin C result, go to https://www.kidney.org/professionals/ kdoqi/gfr%5Fcalculator    BUN/Creatinine Ratio NOT APPLICABLE 6 - 22 (calc)   Sodium 139 135 -  146 mmol/L   Potassium 4.3 3.5 - 5.3 mmol/L   Chloride 102 98 - 110 mmol/L   CO2 25 20 - 32 mmol/L   Calcium 9.9 8.6 - 10.3 mg/dL   Total Protein 7.5 6.1 - 8.1 g/dL   Albumin 4.9 3.6 - 5.1 g/dL   Globulin 2.6 1.9 - 3.7 g/dL (calc)   AG Ratio 1.9 1.0 - 2.5 (calc)   Total Bilirubin 1.3 (H) 0.2 - 1.2 mg/dL   Alkaline phosphatase (APISO) 56 35 - 144 U/L   AST 16 10 - 35 U/L   ALT 20 9 - 46 U/L  Lipid panel     Status: Abnormal   Collection Time: 02/04/21  1:45 PM  Result Value Ref Range   Cholesterol 119 <200 mg/dL   HDL 36 (L) > OR = 40 mg/dL   Triglycerides 148 <150 mg/dL   LDL Cholesterol (Calc) 60 mg/dL (calc)    Comment: Reference range: <100 . Desirable range <100 mg/dL for primary prevention;   <70 mg/dL for patients with CHD or diabetic patients  with > or = 2 CHD risk factors. .Marland KitchenLDL-C is now calculated using the Martin-Hopkins  calculation, which is a validated novel method providing  better accuracy than the Friedewald equation in the  estimation of LDL-C.  MCresenciano Genreet al. JAnnamaria Helling [id number];419(37: 2061-2068  (http://education.[facility].com/faq/FAQ164)    Total CHOL/HDL Ratio 3.3 <5.0 (calc)   Non-HDL Cholesterol (Calc) 83 <130 mg/dL (calc)    Comment: For patients with diabetes plus 1 major ASCVD risk  factor, treating to a non-HDL-C goal of <100 mg/dL  (LDL-C of <70 mg/dL) is considered a therapeutic  option.   C-reactive protein     Status: None   Collection Time: 02/04/21  1:45 PM  Result Value Ref Range   CRP 0.7 <8.0 mg/L  Sedimentation rate     Status: None   Collection Time: 02/04/21  1:45 PM  Result Value Ref Range   Sed Rate 2 0 - 20 mm/h  POCT HgB A1C     Status: Abnormal   Collection Time: 02/22/21  9:23 AM  Result Value Ref Range   Hemoglobin A1C 7.4 (A) 4.0 - 5.6 %   HbA1c POC (<> result, manual entry)     HbA1c, POC (prediabetic range)     HbA1c, POC (controlled diabetic range)        PHQ2/9: [facility] 02/22/2021  02/04/2021 11/15/2020 06/26/2020 12/09/2019  Decreased Interest 0 0 0 0 0  Down, Depressed, Hopeless 0 0 0 0 0  PHQ - 2 Score 0 0 0 0 0  Altered sleeping 0 0 - - 0  Tired, decreased energy 0 0 - - 0  Change in appetite 0 0 - - 0  Feeling bad or failure about yourself  0 0 - - 0  Trouble concentrating 0 0 - - 0  Moving slowly or fidgety/restless 0 0 - -  0  Suicidal thoughts 0 0 - - 0  PHQ-9 Score 0 0 - - 0  Difficult doing work/chores - Not difficult at all - - -  Some recent data might be hidden    phq 9 is negative   Fall Risk: Fall Risk  02/22/2021 02/04/2021 11/15/2020 06/26/2020 12/09/2019  Falls in the past year? 0 0 0 0 0  Number falls in past yr: - 0 0 0 0  Injury with Fall? - 0 0 0 0  Follow up Falls prevention discussed - - - -      Functional Status Survey: Is the patient deaf or have difficulty hearing?: No Does the patient have difficulty seeing, even when wearing glasses/contacts?: No Does the patient have difficulty concentrating, remembering, or making decisions?: No Does the patient have difficulty walking or climbing stairs?: No Does the patient have difficulty dressing or bathing?: No Does the patient have difficulty doing errands alone such as visiting a doctor's office or shopping?: No    Assessment & Plan  1. Dyslipidemia associated with type 2 diabetes mellitus (HCC)  - POCT HgB A1C - icosapent Ethyl (VASCEPA) 1 g capsule; TAKE 2 CAPSULES BY MOUTH 2 (TWO) TIMES DAILY.  Dispense: 360 capsule; Refill: 1 - atorvastatin (LIPITOR) 80 MG tablet; Take 1 tablet (80 mg total) by mouth daily at 6 PM.  Dispense: 90 tablet; Refill: 1 - ezetimibe (ZETIA) 10 MG tablet; Take 1 tablet (10 mg total) by mouth daily.  Dispense: 90 tablet; Refill: 1 - Dulaglutide (TRULICITY) 1.5 XL/2.4MW SOPN; Inject 1.5 mg into the skin once a week.  Dispense: 6 mL; Refill: 0  2. Coronary artery disease of native artery of native heart with stable angina pectoris ([facility]CC)  - metoprolol  succinate (TOPROL-XL) 25 MG 24 hr tablet; Take 0.5 tablets (12.5 mg total) by mouth daily.  Dispense: 45 tablet; Refill: 1 - lisinopril (ZESTRIL) 2.5 MG tablet; Take 1 tablet (2.5 mg total) by mouth daily.  Dispense: 90 tablet; Refill: 1  3. Hypercholesteremia  - atorvastatin (LIPITOR) 80 MG tablet; Take 1 tablet (80 mg total) by mouth daily at 6 PM.  Dispense: 90 tablet; Refill: 1 - ezetimibe (ZETIA) 10 MG tablet; Take 1 tablet (10 mg total) by mouth daily.  Dispense: 90 tablet; Refill: 1  4. Type 2 diabetes mellitus with diabetic nephropathy, without long-term current use of insulin (HCC)  - insulin glargine (LANTUS SOLOSTAR) 100 UNIT/ML Solostar Pen; INJECT 20-50 UNITS INTO SKIN DAILY. 2 UNITS EVERY 3 DAYS TO KEEP FASTING BETWEEN 100-140 (MAX 50/DAY  Dispense: 15 mL; Refill: 0 - lisinopril (ZESTRIL) 2.5 MG tablet; Take 1 tablet (2.5 mg total) by mouth daily.  Dispense: 90 tablet; Refill: 1 - Dulaglutide (TRULICITY) 1.5 NU/2.7OZ SOPN; Inject 1.5 mg into the skin once a week.  Dispense: 6 mL; Refill: 0  5. Benign hypertension  - metoprolol succinate (TOPROL-XL) 25 MG 24 hr tablet; Take 0.5 tablets (12.5 mg total) by mouth daily.  Dispense: 45 tablet; Refill: 1 - lisinopril (ZESTRIL) 2.5 MG tablet; Take 1 tablet (2.5 mg total) by mouth daily.  Dispense: 90 tablet; Refill: 1  6. Testicular pain, right  - Ambulatory referral to Urology  7. Arteriosclerosis of coronary artery

## 2021-02-22 ENCOUNTER — Encounter: Payer: Self-pay | Admitting: Family Medicine

## 2021-02-22 ENCOUNTER — Other Ambulatory Visit: Payer: Self-pay

## 2021-02-22 ENCOUNTER — Ambulatory Visit: Payer: 59 | Admitting: Family Medicine

## 2021-02-22 VITALS — BP 132/82 | HR 93 | Temp 97.8°F | Resp 18 | Ht 68.0 in | Wt 219.6 lb

## 2021-02-22 DIAGNOSIS — E785 Hyperlipidemia, unspecified: Secondary | ICD-10-CM

## 2021-02-22 DIAGNOSIS — I25118 Atherosclerotic heart disease of native coronary artery with other forms of angina pectoris: Secondary | ICD-10-CM | POA: Diagnosis not present

## 2021-02-22 DIAGNOSIS — E1121 Type 2 diabetes mellitus with diabetic nephropathy: Secondary | ICD-10-CM

## 2021-02-22 DIAGNOSIS — I10 Essential (primary) hypertension: Secondary | ICD-10-CM

## 2021-02-22 DIAGNOSIS — Z23 Encounter for immunization: Secondary | ICD-10-CM | POA: Diagnosis not present

## 2021-02-22 DIAGNOSIS — I2510 Atherosclerotic heart disease of native coronary artery without angina pectoris: Secondary | ICD-10-CM

## 2021-02-22 DIAGNOSIS — N50811 Right testicular pain: Secondary | ICD-10-CM

## 2021-02-22 DIAGNOSIS — E7800 Pure hypercholesterolemia, unspecified: Secondary | ICD-10-CM | POA: Diagnosis not present

## 2021-02-22 DIAGNOSIS — E1169 Type 2 diabetes mellitus with other specified complication: Secondary | ICD-10-CM | POA: Diagnosis not present

## 2021-02-22 LAB — POCT GLYCOSYLATED HEMOGLOBIN (HGB A1C): Hemoglobin A1C: 7.4 % — AB (ref 4.0–5.6)

## 2021-02-22 MED ORDER — LISINOPRIL 2.5 MG PO TABS: 2.5000 mg | ORAL_TABLET | Freq: Every day | ORAL | 1 refills | Status: DC

## 2021-02-22 MED ORDER — LANTUS SOLOSTAR 100 UNIT/ML ~~LOC~~ SOPN: PEN_INJECTOR | SUBCUTANEOUS | 0 refills | Status: DC

## 2021-02-22 MED ORDER — METOPROLOL SUCCINATE ER 25 MG PO TB24
12.5000 mg | ORAL_TABLET | Freq: Every day | ORAL | 1 refills | Status: DC
Start: 2021-02-22 — End: 2021-05-29

## 2021-02-22 MED ORDER — TRULICITY 1.5 MG/0.5ML ~~LOC~~ SOAJ: 1.5000 mg | SUBCUTANEOUS | 0 refills | Status: DC

## 2021-02-22 MED ORDER — EZETIMIBE 10 MG PO TABS: 10.0000 mg | ORAL_TABLET | Freq: Every day | ORAL | 1 refills | Status: DC

## 2021-02-22 MED ORDER — ATORVASTATIN CALCIUM 80 MG PO TABS: 80.0000 mg | ORAL_TABLET | Freq: Every day | ORAL | 1 refills | Status: DC

## 2021-02-22 MED ORDER — ICOSAPENT ETHYL 1 G PO CAPS: ORAL_CAPSULE | ORAL | 1 refills | Status: DC

## 2021-02-25 ENCOUNTER — Other Ambulatory Visit: Payer: Self-pay

## 2021-02-25 DIAGNOSIS — N50819 Testicular pain, unspecified: Secondary | ICD-10-CM

## 2021-02-26 ENCOUNTER — Other Ambulatory Visit
Admission: RE | Admit: 2021-02-26 | Discharge: 2021-02-26 | Disposition: A | Discharge status: Home / Self Care | Payer: 59 | Attending: Urology | Admitting: Urology

## 2021-02-26 ENCOUNTER — Ambulatory Visit: Payer: 59 | Admitting: Urology

## 2021-02-26 ENCOUNTER — Other Ambulatory Visit: Payer: Self-pay

## 2021-02-26 ENCOUNTER — Encounter: Payer: Self-pay | Admitting: Urology

## 2021-02-26 VITALS — BP 119/65 | HR 87 | Ht 68.0 in | Wt 217.0 lb

## 2021-02-26 DIAGNOSIS — N50819 Testicular pain, unspecified: Secondary | ICD-10-CM | POA: Insufficient documentation

## 2021-02-26 DIAGNOSIS — N50811 Right testicular pain: Secondary | ICD-10-CM

## 2021-02-26 LAB — URINALYSIS, COMPLETE (UACMP) WITH MICROSCOPIC
Bacteria, UA: NONE SEEN
Bilirubin Urine: NEGATIVE
Glucose, UA: >1000 mg/dL — AB
Hgb urine dipstick: NEGATIVE
Ketones, ur: NEGATIVE mg/dL
Leukocytes,Ua: NEGATIVE
Nitrite: NEGATIVE
Protein, ur: NEGATIVE mg/dL
Specific Gravity, Urine: 1.02 (ref 1.005–1.030)
pH: 5.5 (ref 5.0–8.0)

## 2021-02-26 MED ORDER — CELECOXIB 200 MG PO CAPS: 200.0000 mg | ORAL_CAPSULE | Freq: Two times a day (BID) | ORAL | 0 refills | Status: DC

## 2021-02-26 NOTE — Progress Notes (Signed)
   02/26/21 1:26 PM   Jesse Carnes Jr. 1961/02/15 [id number]  CC: Scrotal pain  HPI: 60 year old male with a few weeks of intermittent scrotal pain, which seems to be worse on the right than the left.  Worse with sexual activity.  He takes PDE 5 inhibitors for ED.  He was treated with antibiotics for possible epididymitis with his PCP which improved his pain somewhat, but he still has some discomfort.  He denies any urinary symptoms or gross hematuria.  No history of kidney stones.  Urinalysis today is benign.   PMH: Past Medical History:  Diagnosis Date   Allergy    Anxiety    COPD (chronic obstructive pulmonary disease) ([place])    Diabetes mellitus without complication ([place])    Hyperlipidemia    Hypertension    Male impotence    MI (myocardial infarction) ([place])    Obesity    PNA (pneumonia)     Surgical History: Past Surgical History:  Procedure Laterality Date   cadiac stenting     CYSTECTOMY     60 years of age   KNEE ARTHROSCOPY        Family History: Family History  Problem Relation Age of Onset   Diabetes Mother    CAD Mother    Heart disease Mother    Heart attack Mother    Heart disease Father    Heart attack Father     Social History:  reports that he quit smoking about 7 years ago. His smoking use included cigarettes. He has a 45.00 pack-year smoking history. He uses smokeless tobacco. He reports that he does not drink alcohol and does not use drugs.  Physical Exam: BP 119/65 (BP Location: Left Arm, Patient Position: Sitting, Cuff Size: Large)   Pulse 87   Ht 5' 8"  (1.727 m)   Wt 217 lb (98.4 kg)   BMI 32.99 kg/m    Constitutional:  Alert and oriented, No acute distress. Cardiovascular: No clubbing, cyanosis, or edema. Respiratory: Normal respiratory effort, no increased work of breathing. GI: Abdomen is soft, nontender, nondistended, no abdominal masses GU: Phallus with patent meatus, no lesions, testicles 20 cc and descended bilaterally  without masses, mild tenderness bilaterally, no lesions  Assessment & Plan:   60 year old male with scrotal pain of unclear etiology.  We discussed possible causes including infection, inflammation, idiopathic.  I recommended a scrotal ultrasound for further evaluation, as well as a trial of Celebrex twice daily x10 days.  We discussed behavioral strategies including rest, snug fitting underwear, and icing as needed.  Celebrex x10 days Call with scrotal ultrasound results  Nickolas Madrid, MD 02/26/2021  [facility] [address], [place], [place] [address] [phone]

## 2021-03-04 ENCOUNTER — Ambulatory Visit
Admission: RE | Admit: 2021-03-04 | Discharge: 2021-03-04 | Disposition: A | Discharge status: Home / Self Care | Payer: 59 | Source: Ambulatory Visit | Attending: Urology | Admitting: Urology

## 2021-03-04 ENCOUNTER — Other Ambulatory Visit: Payer: Self-pay

## 2021-03-04 DIAGNOSIS — N50811 Right testicular pain: Secondary | ICD-10-CM | POA: Diagnosis not present

## 2021-03-06 ENCOUNTER — Telehealth: Payer: Self-pay

## 2021-03-06 NOTE — Telephone Encounter (Signed)
-----   Message from Billey Co, MD sent at 03/05/2021  8:28 AM EST ----- Good news, scrotal ultrasound is normal.  Follow-up with urology in the next 1 to 2 months if pain is not improved  Nickolas Madrid, MD 03/05/2021

## 2021-03-07 ENCOUNTER — Ambulatory Visit: Admission: RE | Admit: 2021-03-07 | Payer: 59 | Source: Ambulatory Visit

## 2021-03-11 ENCOUNTER — Ambulatory Visit
Admission: EM | Admit: 2021-03-11 | Discharge: 2021-03-11 | Disposition: A | Discharge status: Home / Self Care | Payer: 59 | Attending: Physician Assistant | Admitting: Physician Assistant

## 2021-03-11 ENCOUNTER — Ambulatory Visit (INDEPENDENT_AMBULATORY_CARE_PROVIDER_SITE_OTHER): Payer: 59

## 2021-03-11 ENCOUNTER — Other Ambulatory Visit: Payer: Self-pay

## 2021-03-11 ENCOUNTER — Encounter: Payer: Self-pay | Admitting: Emergency Medicine

## 2021-03-11 DIAGNOSIS — S61213A Laceration without foreign body of left middle finger without damage to nail, initial encounter: Secondary | ICD-10-CM

## 2021-03-11 MED ORDER — AMOXICILLIN-POT CLAVULANATE 875-125 MG PO TABS: 1.0000 | ORAL_TABLET | Freq: Two times a day (BID) | ORAL | 0 refills | Status: AC

## 2021-03-11 NOTE — ED Provider Notes (Addendum)
[facility]    CSN: [id number] Arrival date & time: 03/11/21  6761      History   Chief Complaint Chief Complaint  Patient presents with   Laceration    HPI Jesse Lawrence. is a 60 y.o. male presenting for large laceration of the left middle digit.  Patient says that he cut it on a metal grinder about 3 hours ago.  Reports his last tetanus was in 2019.  Not reporting any numbness or tingling of the finger.  Patient has had control of bleeding.  Is on Plavix.  Has not cleaned it.  States that he wrapped it up and has been trying to keep it above the level of his heart.  Past medical history significant for diabetes, hypertension, hyperlipidemia, previous MI, obesity and COPD.  HPI  Past Medical History:  Diagnosis Date   Allergy    Anxiety    COPD (chronic obstructive pulmonary disease) ([place])    Diabetes mellitus without complication ([place])    Hyperlipidemia    Hypertension    Male impotence    MI (myocardial infarction) ([place])    Obesity    PNA (pneumonia)     Patient Active Problem List   Diagnosis Date Noted   Coronary artery disease of native artery of native heart with stable angina pectoris ([place]) 11/26/2018   OSA (obstructive sleep apnea) 05/21/2015   Perennial allergic rhinitis 11/07/2014   Arteriosclerosis of coronary artery 11/07/2014   CD (contact dermatitis) 11/07/2014   Decreased libido 11/07/2014   Diabetes mellitus with renal manifestation ([place]) 11/07/2014   Gastro-esophageal reflux disease without esophagitis 11/07/2014   H/O acute myocardial infarction 11/07/2014   Hypercholesteremia 11/07/2014   Benign hypertension 11/07/2014   Hypertriglyceridemia 11/07/2014   H/O high risk medication treatment 11/07/2014   NASH (nonalcoholic steatohepatitis) 11/07/2014   Microalbuminuria 11/07/2014   Adult BMI 30+ 11/07/2014   Fungal infection of toenail 11/07/2014   Tobacco abuse 11/07/2014   ED (erectile dysfunction) of organic origin 09/29/2006     Past Surgical History:  Procedure Laterality Date   cadiac stenting     CYSTECTOMY     60 years of age   KNEE ARTHROSCOPY         Home Medications    Prior to Admission medications   Medication Sig Start Date End Date Taking? Authorizing Provider  amoxicillin-clavulanate (AUGMENTIN) 875-125 MG tablet Take 1 tablet by mouth every 12 (twelve) hours for 7 days. 03/11/21 03/18/21 Yes Danton Clap, PA-C  atorvastatin (LIPITOR) 80 MG tablet Take 1 tablet (80 mg total) by mouth daily at 6 PM. 02/22/21  Yes Sowles, Drue Stager, MD  clopidogrel (PLAVIX) 75 MG tablet TAKE 1 TABLET BY MOUTH EVERY DAY 02/13/21  Yes Sowles, Drue Stager, MD  Dulaglutide (TRULICITY) 1.5 PJ/0.9TO SOPN Inject 1.5 mg into the skin once a week. 02/22/21  Yes Sowles, Drue Stager, MD  ezetimibe (ZETIA) 10 MG tablet Take 1 tablet (10 mg total) by mouth daily. 02/22/21  Yes Sowles, Drue Stager, MD  famotidine (PEPCID) 40 MG tablet Take 1 tablet (40 mg total) by mouth at bedtime. 11/15/20  Yes Sowles, Drue Stager, MD  fluticasone (FLONASE) 50 MCG/ACT nasal spray Place 2 sprays into both nostrils as needed. 02/25/19  Yes Sowles, Drue Stager, MD  icosapent Ethyl (VASCEPA) 1 g capsule TAKE 2 CAPSULES BY MOUTH 2 (TWO) TIMES DAILY. 02/22/21  Yes Sowles, Drue Stager, MD  insulin glargine (LANTUS SOLOSTAR) 100 UNIT/ML Solostar Pen INJECT 20-50 UNITS INTO SKIN DAILY. 2 UNITS EVERY 3 DAYS TO  KEEP FASTING BETWEEN 100-140 (MAX 50/DAY 02/22/21  Yes Sowles, Drue Stager, MD  lisinopril (ZESTRIL) 2.5 MG tablet Take 1 tablet (2.5 mg total) by mouth daily. 02/22/21  Yes Sowles, Drue Stager, MD  meloxicam (MOBIC) 15 MG tablet Take 1 tablet (15 mg total) by mouth daily as needed for pain. 12/15/20  Yes Cook, Jayce G, DO  metaxalone (SKELAXIN) 800 MG tablet TAKE 1 TABLET BY MOUTH THREE TIMES A DAY 01/12/20  Yes Sowles, Drue Stager, MD  pantoprazole (PROTONIX) 40 MG tablet Take 1 tablet (40 mg total) by mouth in the morning. 11/15/20  Yes Sowles, Drue Stager, MD  celecoxib (CELEBREX) 200  MG capsule Take 1 capsule (200 mg total) by mouth 2 (two) times daily. 02/26/21   Billey Co, MD  Glucagon (GVOKE HYPOPEN 1-PACK) 1 MG/0.2ML SOAJ Inject 1 each into the skin daily as needed. If glucose very low and unable to eat/drink 06/26/20   Steele Sizer, MD  Insulin Pen Needle (BD PEN NEEDLE NANO 2ND GEN) 32G X 4 MM MISC Inject 1 each into the skin in the morning and at bedtime. 06/26/20   Steele Sizer, MD  loratadine (CLARITIN) 10 MG tablet Take 1 tablet by mouth daily. 08/09/12   [provider]  metoprolol succinate (TOPROL-XL) 25 MG 24 hr tablet Take 0.5 tablets (12.5 mg total) by mouth daily. 02/22/21   Steele Sizer, MD  [facility] ULTRA test strip USE AS DIRECTED 08/07/20   Steele Sizer, MD  sildenafil (VIAGRA) 100 MG tablet TAKE ONE-HALF TO ONE TABLET BY MOUTH DAILY AS NEEDED FOR FOR ERECTILE DYSFUNCTION 01/23/21   Steele Sizer, MD  triamcinolone cream (KENALOG) 0.1 % Apply 1 application topically 2 (two) times daily. 08/05/20   Zigmund Gottron, NP  XIGDUO XR 08-998 MG TB24 TAKE 1 TABLET BY MOUTH IN THE MORNING AND IN THE EVENING 11/21/20   Steele Sizer, MD    Family History Family History  Problem Relation Age of Onset   Diabetes Mother    CAD Mother    Heart disease Mother    Heart attack Mother    Heart disease Father    Heart attack Father     Social History Social History   Tobacco Use   Smoking status: Former    Packs/day: 1.50    Years: 30.00    Pack years: 45.00    Types: Cigarettes    Quit date: 04/19/2013    Years since quitting: 7.8   Smokeless tobacco: Current  Vaping Use   Vaping Use: Former  Substance Use Topics   Alcohol use: No    Alcohol/week: 0.0 standard drinks    Comment: occ   Drug use: No     Allergies   Patient has no known allergies.   Review of Systems Review of Systems  Musculoskeletal:  Positive for arthralgias (left middle finger). Negative for joint swelling.  Skin:  Positive for wound. Negative for  color change.  Neurological:  Negative for weakness and numbness.  Hematological:  Bruises/bleeds easily.    Physical Exam Triage Vital Signs ED Triage Vitals  Enc Vitals Group     BP 03/11/21 1126 140/90     Pulse Rate 03/11/21 1126 90     Resp 03/11/21 1126 18     Temp 03/11/21 1126 98.1 F (36.7 C)     Temp Source 03/11/21 1126 Oral     SpO2 03/11/21 1126 98 %     Weight 03/11/21 1123 208 lb 1.8 oz (94.4 kg)     Height  03/11/21 1123 5' 8"  (1.727 m)     Head Circumference --      Peak Flow --      Pain Score 03/11/21 1122 8     Pain Loc --      Pain Edu? --      Excl. in [place]? --    No data found.  Updated Vital Signs BP 140/90 (BP Location: Left Arm)   Pulse 90   Temp 98.1 F (36.7 C) (Oral)   Resp 18   Ht 5' 8"  (1.727 m)   Wt 208 lb 1.8 oz (94.4 kg)   SpO2 98%   BMI 31.64 kg/m       Physical Exam Vitals and nursing note reviewed.  Constitutional:      General: He is not in acute distress.    Appearance: Normal appearance. He is well-developed. He is not ill-appearing.  HENT:     Head: Normocephalic and atraumatic.  Eyes:     General: No scleral icterus.    Conjunctiva/sclera: Conjunctivae normal.  Cardiovascular:     Rate and Rhythm: Normal rate and regular rhythm.     Pulses: Normal pulses.  Pulmonary:     Effort: Pulmonary effort is normal. No respiratory distress.     Breath sounds: Normal breath sounds.  Musculoskeletal:     Cervical back: Neck supple.  Skin:    General: Skin is warm and dry.     Capillary Refill: Capillary refill takes less than 2 seconds.     Comments: 3 cm jagged laceration dorsal left 3rd digit, dorsally, over PIP. Mildly actively bleeding . +Contamination. Good strength.  Full range of motion of finger.  No tendon involvement.  Neurological:     General: No focal deficit present.     Mental Status: He is alert. Mental status is at baseline.     Motor: No weakness.     Gait: Gait normal.  Psychiatric:        Mood and  Affect: Mood normal.        Behavior: Behavior normal.        Thought Content: Thought content normal.     UC Treatments / Results  Labs (all labs ordered are listed, but only abnormal results are displayed) Labs Reviewed - No data to display  EKG   Radiology DG Finger Middle Left  Result Date: 03/11/2021 CLINICAL DATA:  Laceration over the dorsal aspect of the PIP joint, cup with a metal grinder. EXAM: LEFT MIDDLE FINGER 2+V COMPARISON:  None. FINDINGS: There is a soft tissue injury over the dorsal aspect of the third PIP joint. No radiopaque foreign body or underlying osseous/joint abnormality. IMPRESSION: Soft tissue injury without radiopaque foreign body or fracture. Electronically Signed   By: Lorin Picket M.D.   On: 03/11/2021 12:10    Procedures Laceration Repair  Date/Time: 03/11/2021 2:07 PM Performed by: Danton Clap, PA-C Authorized by: Danton Clap, PA-C   Consent:    Consent obtained:  Verbal   Consent given by:  Patient   Risks, benefits, and alternatives were discussed: yes     Risks discussed:  Infection, pain, retained foreign body, tendon damage, poor cosmetic result, need for additional repair, nerve damage, poor wound healing and vascular damage   Alternatives discussed:  No treatment, delayed treatment and referral Universal protocol:    Patient identity confirmed:  Verbally with patient Anesthesia:    Anesthesia method:  Local infiltration   Local anesthetic:  Lidocaine 1% w/o epi Laceration  details:    Location:  Finger   Finger location:  L long finger   Length (cm):  3 Pre-procedure details:    Preparation:  Patient was prepped and draped in usual sterile fashion Exploration:    Hemostasis achieved with:  Tourniquet and direct pressure   Wound exploration: wound explored through full range of motion and entire depth of wound visualized     Wound extent: areolar tissue violated     Contaminated: yes   Treatment:    Area cleansed with:   Chlorhexidine and saline   Amount of cleaning:  Extensive   Irrigation solution:  Sterile saline   Irrigation method:  Syringe   Visualized foreign bodies/material removed: yes (black debris, dirt)     Debridement:  Minimal Skin repair:    Repair method:  Sutures   Suture size:  5-0   Suture material:  Nylon   Suture technique:  Simple interrupted   Number of sutures:  10 Approximation:    Approximation:  Close Repair type:    Repair type:  Simple Post-procedure details:    Dressing:  Non-adherent dressing   Procedure completion:  Tolerated well, no immediate complications (including critical care time)  Medications Ordered in UC Medications - No data to display  Initial Impression / Assessment and Plan / UC Course  I have reviewed the triage vital signs and the nursing notes.  Pertinent labs & imaging results that were available during my care of the patient were reviewed by me and considered in my medical decision making (see chart for details).  60 year old male presenting for 3 cm jagged laceration over the left third middle digit.  Actively bleeding mildly.  Patient on Plavix.  The wound is contaminated.  Tetanus up-to-date.  X-ray performed to assess for possible underlying fracture.  X-ray reviewed by me.  Overread confirms no evidence of fracture.  Discussed this with patient.  He agrees to have the wound repaired.  Laceration repair with 10 simple sutures.  I did have to extensively clean and minimally debride since the wound is jagged.  Patient then placed in a splint and bandage.  Advised following up in 10 days for suture removal or sooner for any signs of infection.  I did send Augmentin to pharmacy since his wound was deep, contaminated and he has a history of diabetes.  Discussed wound care.  Tylenol for pain.   Final Clinical Impressions(s) / UC Diagnoses   Final diagnoses:  Laceration of left middle finger without foreign body without damage to nail, initial  encounter     Discharge Instructions      -I have sutured your wound. - Do not get it wet for 48 hours and then just clean gently with soap and water and make sure it is dry.  Change your bandage every day. - Tylenol or Motrin as needed for pain. - I have sent antibiotics to try to prevent infection but if you notice any signs of infection such as increased swelling, redness or pustular drainage to the area or increased pain you should be seen again immediately, otherwise follow-up in 10 days to have stitches removed. - Try to wear the splint until you have the sutures removed or at least for the next 5 to 7 days.  Be careful with the stitches.  If you bend the finger it may rip the stitches.     ED Prescriptions     Medication Sig Dispense Auth. Provider   amoxicillin-clavulanate (AUGMENTIN) 875-125 MG tablet  Take 1 tablet by mouth every 12 (twelve) hours for 7 days. 14 tablet Gretta Cool      PDMP not reviewed this encounter.   Danton Clap, PA-C 03/11/21 1429    Laurene Footman B, PA-C 03/11/21 1430

## 2021-03-11 NOTE — ED Triage Notes (Signed)
Pt has laceration on his left middle finger. He states he cut it on a metal grinder about 3 hours ago. Last tdap was 05/12/2017

## 2021-03-11 NOTE — Discharge Instructions (Signed)
-  I have sutured your wound. - Do not get it wet for 48 hours and then just clean gently with soap and water and make sure it is dry.  Change your bandage every day. - Tylenol or Motrin as needed for pain. - I have sent antibiotics to try to prevent infection but if you notice any signs of infection such as increased swelling, redness or pustular drainage to the area or increased pain you should be seen again immediately, otherwise follow-up in 10 days to have stitches removed. - Try to wear the splint until you have the sutures removed or at least for the next 5 to 7 days.  Be careful with the stitches.  If you bend the finger it may rip the stitches.

## 2021-03-11 NOTE — ED Notes (Signed)
Left middle finger dressed with none stick and coban. Followed by splint to ensure the sutures stay in tact.

## 2021-03-21 ENCOUNTER — Ambulatory Visit
Admission: RE | Admit: 2021-03-21 | Discharge: 2021-03-21 | Disposition: A | Discharge status: Home / Self Care | Payer: 59 | Source: Ambulatory Visit | Attending: Family Medicine | Admitting: Family Medicine

## 2021-03-21 ENCOUNTER — Other Ambulatory Visit: Payer: Self-pay

## 2021-03-21 VITALS — BP 130/77 | HR 92 | Temp 98.1°F | Resp 16 | Wt 208.1 lb

## 2021-03-21 DIAGNOSIS — S61213D Laceration without foreign body of left middle finger without damage to nail, subsequent encounter: Secondary | ICD-10-CM

## 2021-03-21 DIAGNOSIS — Z4802 Encounter for removal of sutures: Secondary | ICD-10-CM

## 2021-03-21 NOTE — ED Provider Notes (Signed)
[facility]    CSN: [id number] Arrival date & time: 03/21/21  1533      History   Chief Complaint Chief Complaint  Patient presents with   Suture / Staple Removal    HPI Jesse Lawrence. is a 60 y.o. male presenting for suture removal.  Patient was seen by me 10 days ago and had 10 stitches placed in the dorsal left middle digit.  Patient has been wearing his splint religiously and has also taken the antibiotics that were prescribed.  Patient says he still has some tenderness of the finger.  The skin is not erythematous or swollen.  He has taken Celebrex which she normally takes.  He says he has some difficulty moving his finger because the finger feels stiff.  But he does admit that he has been wearing a splint and has not really been trying to move it much.  No numbness or weakness.  HPI  Past Medical History:  Diagnosis Date   Allergy    Anxiety    COPD (chronic obstructive pulmonary disease) ([place])    Diabetes mellitus without complication ([place])    Hyperlipidemia    Hypertension    Male impotence    MI (myocardial infarction) ([place])    Obesity    PNA (pneumonia)     Patient Active Problem List   Diagnosis Date Noted   Coronary artery disease of native artery of native heart with stable angina pectoris ([place]) 11/26/2018   OSA (obstructive sleep apnea) 05/21/2015   Perennial allergic rhinitis 11/07/2014   Arteriosclerosis of coronary artery 11/07/2014   CD (contact dermatitis) 11/07/2014   Decreased libido 11/07/2014   Diabetes mellitus with renal manifestation ([place]) 11/07/2014   Gastro-esophageal reflux disease without esophagitis 11/07/2014   H/O acute myocardial infarction 11/07/2014   Hypercholesteremia 11/07/2014   Benign hypertension 11/07/2014   Hypertriglyceridemia 11/07/2014   H/O high risk medication treatment 11/07/2014   NASH (nonalcoholic steatohepatitis) 11/07/2014   Microalbuminuria 11/07/2014   Adult BMI 30+ 11/07/2014   Fungal  infection of toenail 11/07/2014   Tobacco abuse 11/07/2014   ED (erectile dysfunction) of organic origin 09/29/2006    Past Surgical History:  Procedure Laterality Date   cadiac stenting     CYSTECTOMY     60 years of age   KNEE ARTHROSCOPY         Home Medications    Prior to Admission medications   Medication Sig Start Date End Date Taking? Authorizing Provider  atorvastatin (LIPITOR) 80 MG tablet Take 1 tablet (80 mg total) by mouth daily at 6 PM. 02/22/21   Ancil Boozer, Drue Stager, MD  celecoxib (CELEBREX) 200 MG capsule Take 1 capsule (200 mg total) by mouth 2 (two) times daily. 02/26/21   Billey Co, MD  clopidogrel (PLAVIX) 75 MG tablet TAKE 1 TABLET BY MOUTH EVERY DAY 02/13/21   Ancil Boozer, Drue Stager, MD  Dulaglutide (TRULICITY) 1.5 GG/8.3MO SOPN Inject 1.5 mg into the skin once a week. 02/22/21   Steele Sizer, MD  ezetimibe (ZETIA) 10 MG tablet Take 1 tablet (10 mg total) by mouth daily. 02/22/21   Steele Sizer, MD  famotidine (PEPCID) 40 MG tablet Take 1 tablet (40 mg total) by mouth at bedtime. 11/15/20   Steele Sizer, MD  fluticasone (FLONASE) 50 MCG/ACT nasal spray Place 2 sprays into both nostrils as needed. 02/25/19   Steele Sizer, MD  Glucagon (GVOKE HYPOPEN 1-PACK) 1 MG/0.2ML SOAJ Inject 1 each into the skin daily as needed. If glucose very low  and unable to eat/drink 06/26/20   Steele Sizer, MD  icosapent Ethyl (VASCEPA) 1 g capsule TAKE 2 CAPSULES BY MOUTH 2 (TWO) TIMES DAILY. 02/22/21   Steele Sizer, MD  insulin glargine (LANTUS SOLOSTAR) 100 UNIT/ML Solostar Pen INJECT 20-50 UNITS INTO SKIN DAILY. 2 UNITS EVERY 3 DAYS TO KEEP FASTING BETWEEN 100-140 (MAX 50/DAY 02/22/21   Sowles, Drue Stager, MD  Insulin Pen Needle (BD PEN NEEDLE NANO 2ND GEN) 32G X 4 MM MISC Inject 1 each into the skin in the morning and at bedtime. 06/26/20   Steele Sizer, MD  lisinopril (ZESTRIL) 2.5 MG tablet Take 1 tablet (2.5 mg total) by mouth daily. 02/22/21   Steele Sizer, MD   loratadine (CLARITIN) 10 MG tablet Take 1 tablet by mouth daily. 08/09/12   [provider]  meloxicam (MOBIC) 15 MG tablet Take 1 tablet (15 mg total) by mouth daily as needed for pain. 12/15/20   Coral Spikes, DO  metaxalone (SKELAXIN) 800 MG tablet TAKE 1 TABLET BY MOUTH THREE TIMES A DAY 01/12/20   Steele Sizer, MD  metoprolol succinate (TOPROL-XL) 25 MG 24 hr tablet Take 0.5 tablets (12.5 mg total) by mouth daily. 02/22/21   Steele Sizer, MD  [facility] ULTRA test strip USE AS DIRECTED 08/07/20   Steele Sizer, MD  pantoprazole (PROTONIX) 40 MG tablet Take 1 tablet (40 mg total) by mouth in the morning. 11/15/20   Steele Sizer, MD  sildenafil (VIAGRA) 100 MG tablet TAKE ONE-HALF TO ONE TABLET BY MOUTH DAILY AS NEEDED FOR FOR ERECTILE DYSFUNCTION 01/23/21   Steele Sizer, MD  triamcinolone cream (KENALOG) 0.1 % Apply 1 application topically 2 (two) times daily. 08/05/20   Zigmund Gottron, NP  XIGDUO XR 08-998 MG TB24 TAKE 1 TABLET BY MOUTH IN THE MORNING AND IN THE EVENING 11/21/20   Steele Sizer, MD    Family History Family History  Problem Relation Age of Onset   Diabetes Mother    CAD Mother    Heart disease Mother    Heart attack Mother    Heart disease Father    Heart attack Father     Social History Social History   Tobacco Use   Smoking status: Former    Packs/day: 1.50    Years: 30.00    Pack years: 45.00    Types: Cigarettes    Quit date: 04/19/2013    Years since quitting: 7.9   Smokeless tobacco: Current  Vaping Use   Vaping Use: Former  Substance Use Topics   Alcohol use: No    Alcohol/week: 0.0 standard drinks    Comment: occ   Drug use: No     Allergies   Patient has no known allergies.   Review of Systems Review of Systems  Constitutional:  Negative for fever.  Musculoskeletal:  Positive for arthralgias. Negative for joint swelling.  Skin:  Negative for color change and wound.  Neurological:  Negative for weakness and numbness.     Physical Exam Triage Vital Signs ED Triage Vitals  Enc Vitals Group     BP 03/21/21 1547 130/77     Pulse Rate 03/21/21 1547 92     Resp 03/21/21 1547 16     Temp 03/21/21 1547 98.1 F (36.7 C)     Temp Source 03/21/21 1547 Oral     SpO2 03/21/21 1547 98 %     Weight 03/21/21 1546 208 lb 1.8 oz (94.4 kg)     Height --  Head Circumference --      Peak Flow --      Pain Score 03/21/21 1546 3     Pain Loc --      Pain Edu? --      Excl. in [place]? --    No data found.  Updated Vital Signs BP 130/77 (BP Location: Left Arm)    Pulse 92    Temp 98.1 F (36.7 C) (Oral)    Resp 16    Wt 208 lb 1.8 oz (94.4 kg)    SpO2 98%    BMI 31.64 kg/m      Physical Exam Vitals and nursing note reviewed.  Constitutional:      General: He is not in acute distress.    Appearance: He is well-developed.  HENT:     Head: Normocephalic and atraumatic.  Eyes:     General: No scleral icterus.    Conjunctiva/sclera: Conjunctivae normal.  Cardiovascular:     Rate and Rhythm: Normal rate.     Pulses: Normal pulses.  Pulmonary:     Effort: Pulmonary effort is normal. No respiratory distress.  Musculoskeletal:     Cervical back: Neck supple.  Skin:    General: Skin is warm and dry.     Capillary Refill: Capillary refill takes less than 2 seconds.     Comments: 3 cm laceration dorsal left 3rd digit, dorsally, over PIP.  All sutures in place.  10 simple sutures in place.  Tenderness palpation over the PIP.  Reduced range of motion at MCP and PIP to about 30% of normal.  Good sensation and strength.  Neurological:     General: No focal deficit present.     Mental Status: He is alert. Mental status is at baseline.     Motor: No weakness.     Coordination: Coordination normal.     Gait: Gait normal.  Psychiatric:        Mood and Affect: Mood normal.        Behavior: Behavior normal.        Thought Content: Thought content normal.     UC Treatments / Results  Labs (all labs ordered are  listed, but only abnormal results are displayed) Labs Reviewed - No data to display  EKG   Radiology No results found.  Procedures Procedures (including critical care time)  Medications Ordered in UC Medications - No data to display  Initial Impression / Assessment and Plan / UC Course  I have reviewed the triage vital signs and the nursing notes.  Pertinent labs & imaging results that were available during my care of the patient were reviewed by me and considered in my medical decision making (see chart for details).  60 year old male presenting for suture removal.  I saw patient 10 days ago and placed 10 simple sutures in dorsal left middle digit.  All sutures in place.  Cleaned area with alcohol swab and removed all sutures.  Finger is still tender to palpation.  Reduced range of motion at the PIP and MCP to about 30% of normal.  Patient has had finger in splint for 10 days.  Has taken oral antibiotics.  No sign of infection on exam.  I believe the reduced range of motion of his finger is likely due to the fact that it has been in a splint for 10 days and he is not moving it.  Advised performing hand exercises a couple times a day and continue the Celebrex and applying ice to  the area.  If no improvement in the next 7 to 10 days he should follow with PCP as he may need to see specialist.  Based on my initial exam it did not appear that he had any tendon involvement so I expect it should get better with hand exercise and time.   Final Clinical Impressions(s) / UC Diagnoses   Final diagnoses:  Laceration of left middle finger without foreign body without damage to nail, subsequent encounter  Visit for suture removal     Discharge Instructions      -I have taken all stitches out.  There is no sign of infection.  You did a great job taking care of your stitches.  You do not need to use a splint anymore.  I want you to start moving the finger and doing hand exercises in the  handout. -You can take Tylenol for discomfort and continue with your Celebrex.  He can also apply ice to the area to help with any swelling.  Make sure you are doing hand exercises a couple times a day. - Your range of motion should be improved over the next 7 to 10 days at least 50%.  If its not you need to follow-up with your primary care provider.  As we discussed, your finger has been immobile for the past 10 days so I am not surprised it is stiff.  It should improve.  If it does not you may need to see Ortho to make sure there is no tendon damage but I do not believe there is.   ED Prescriptions   None    PDMP not reviewed this encounter.   Danton Clap, PA-C 03/21/21 1610

## 2021-03-21 NOTE — Discharge Instructions (Signed)
-  I have taken all stitches out.  There is no sign of infection.  You did a great job taking care of your stitches.  You do not need to use a splint anymore.  I want you to start moving the finger and doing hand exercises in the handout. -You can take Tylenol for discomfort and continue with your Celebrex.  He can also apply ice to the area to help with any swelling.  Make sure you are doing hand exercises a couple times a day. - Your range of motion should be improved over the next 7 to 10 days at least 50%.  If its not you need to follow-up with your primary care provider.  As we discussed, your finger has been immobile for the past 10 days so I am not surprised it is stiff.  It should improve.  If it does not you may need to see Ortho to make sure there is no tendon damage but I do not believe there is.

## 2021-03-25 NOTE — Progress Notes (Signed)
Name: Jesse Lawrence.   MRN: [id number]    DOB: [birth year]   Date:03/26/2021       Progress Note  Subjective  Chief Complaint  Cough/ Doesn't Feel Good  HPI  Viral illness: sudden onset of chills and fever 4 days ago, followed by nasal congestion, rhinorrhea, productive cough that is green in color. Denies fatigue. Wife was sick with similar symptoms last week . His appetite has bee normal. He has been taking otc medication. Glucose has been controlled   He never had COVID - 19 vaccine, he did get flu vaccine  Patient Active Problem List   Diagnosis Date Noted   Coronary artery disease of native artery of native heart with stable angina pectoris ([place]) 11/26/2018   OSA (obstructive sleep apnea) 05/21/2015   Perennial allergic rhinitis 11/07/2014   Arteriosclerosis of coronary artery 11/07/2014   CD (contact dermatitis) 11/07/2014   Decreased libido 11/07/2014   Diabetes mellitus with renal manifestation ([place]) 11/07/2014   Gastro-esophageal reflux disease without esophagitis 11/07/2014   H/O acute myocardial infarction 11/07/2014   Hypercholesteremia 11/07/2014   Benign hypertension 11/07/2014   Hypertriglyceridemia 11/07/2014   H/O high risk medication treatment 11/07/2014   NASH (nonalcoholic steatohepatitis) 11/07/2014   Microalbuminuria 11/07/2014   Adult BMI 30+ 11/07/2014   Fungal infection of toenail 11/07/2014   Tobacco abuse 11/07/2014   ED (erectile dysfunction) of organic origin 09/29/2006    Past Surgical History:  Procedure Laterality Date   cadiac stenting     CYSTECTOMY     60 years of age   KNEE ARTHROSCOPY      Family History  Problem Relation Age of Onset   Diabetes Mother    CAD Mother    Heart disease Mother    Heart attack Mother    Heart disease Father    Heart attack Father     Social History   Tobacco Use   Smoking status: Former    Packs/day: 1.50    Years: 30.00    Pack years: 45.00    Types: Cigarettes    Quit date: 04/19/2013     Years since quitting: 7.9   Smokeless tobacco: Current  Substance Use Topics   Alcohol use: No    Alcohol/week: 0.0 standard drinks    Comment: occ     Current Outpatient Medications:    atorvastatin (LIPITOR) 80 MG tablet, Take 1 tablet (80 mg total) by mouth daily at 6 PM., Disp: 90 tablet, Rfl: 1   celecoxib (CELEBREX) 200 MG capsule, Take 1 capsule (200 mg total) by mouth 2 (two) times daily., Disp: 20 capsule, Rfl: 0   clopidogrel (PLAVIX) 75 MG tablet, TAKE 1 TABLET BY MOUTH EVERY DAY, Disp: 90 tablet, Rfl: 0   Dulaglutide (TRULICITY) 1.5 RF/1.6BW SOPN, Inject 1.5 mg into the skin once a week., Disp: 6 mL, Rfl: 0   ezetimibe (ZETIA) 10 MG tablet, Take 1 tablet (10 mg total) by mouth daily., Disp: 90 tablet, Rfl: 1   famotidine (PEPCID) 40 MG tablet, Take 1 tablet (40 mg total) by mouth at bedtime., Disp: 90 tablet, Rfl: 1   fluticasone (FLONASE) 50 MCG/ACT nasal spray, Place 2 sprays into both nostrils as needed., Disp: 48 g, Rfl: 1   Glucagon (GVOKE HYPOPEN 1-PACK) 1 MG/0.2ML SOAJ, Inject 1 each into the skin daily as needed. If glucose very low and unable to eat/drink, Disp: 0.2 mL, Rfl: 1   icosapent Ethyl (VASCEPA) 1 g capsule, TAKE 2 CAPSULES BY MOUTH 2 (TWO)  TIMES DAILY., Disp: 360 capsule, Rfl: 1   insulin glargine (LANTUS SOLOSTAR) 100 UNIT/ML Solostar Pen, INJECT 20-50 UNITS INTO SKIN DAILY. 2 UNITS EVERY 3 DAYS TO KEEP FASTING BETWEEN 100-140 (MAX 50/DAY, Disp: 15 mL, Rfl: 0   Insulin Pen Needle (BD PEN NEEDLE NANO 2ND GEN) 32G X 4 MM MISC, Inject 1 each into the skin in the morning and at bedtime., Disp: 200 each, Rfl: 2   lisinopril (ZESTRIL) 2.5 MG tablet, Take 1 tablet (2.5 mg total) by mouth daily., Disp: 90 tablet, Rfl: 1   loratadine (CLARITIN) 10 MG tablet, Take 1 tablet by mouth daily., Disp: , Rfl:    meloxicam (MOBIC) 15 MG tablet, Take 1 tablet (15 mg total) by mouth daily as needed for pain., Disp: 30 tablet, Rfl: 0   metaxalone (SKELAXIN) 800 MG tablet, TAKE  1 TABLET BY MOUTH THREE TIMES A DAY, Disp: 90 tablet, Rfl: 2   metoprolol succinate (TOPROL-XL) 25 MG 24 hr tablet, Take 0.5 tablets (12.5 mg total) by mouth daily., Disp: 45 tablet, Rfl: 1   ONETOUCH ULTRA test strip, USE AS DIRECTED, Disp: 100 strip, Rfl: 11   pantoprazole (PROTONIX) 40 MG tablet, Take 1 tablet (40 mg total) by mouth in the morning., Disp: 90 tablet, Rfl: 1   sildenafil (VIAGRA) 100 MG tablet, TAKE ONE-HALF TO ONE TABLET BY MOUTH DAILY AS NEEDED FOR FOR ERECTILE DYSFUNCTION, Disp: 30 tablet, Rfl: 0   triamcinolone cream (KENALOG) 0.1 %, Apply 1 application topically 2 (two) times daily., Disp: 30 g, Rfl: 0   XIGDUO XR 08-998 MG TB24, TAKE 1 TABLET BY MOUTH IN THE MORNING AND IN THE EVENING, Disp: 180 tablet, Rfl: 1  No Known Allergies  I personally reviewed active problem list, medication list, allergies, family history, social history, health maintenance with the patient/caregiver today.   ROS  Ten systems reviewed and is negative except as mentioned in HPI   Objective  Vitals:   03/26/21 1516  BP: 132/68  Pulse: (!) 106  Resp: 16  Temp: 98.1 F (36.7 C)  SpO2: 97%  Weight: 215 lb (97.5 kg)  Height: _0  (1.727 m)    Body mass index is 32.69 kg/m.  Physical Exam  Constitutional: Patient appears well-developed and well-nourished. Obese  No distress.  HEENT: head atraumatic, normocephalic, pupils equal and reactive to light, neck supple Cardiovascular: Normal rate, regular rhythm and normal heart sounds.  No murmur heard. No BLE edema. Pulmonary/Chest: Effort normal and breath sounds normal. No respiratory distress. Abdominal: Soft.  There is no tenderness. Psychiatric: Patient has a normal mood and affect. behavior is normal. Judgment and thought content normal.   Recent Results (from the past 2160 hour(s))  Urine cytology ancillary only     Status: None   Collection Time: 02/04/21  1:34 PM  Result Value Ref Range   Neisseria Gonorrhea Negative     Chlamydia Negative    Trichomonas Negative    Bacterial Vaginitis-Urine Negative    Candida Urine Negative    Molecular Comment      This specimen does not meet the strict criteria set by the FDA. The   Molecular Comment      result interpretation should be considered in conjunction with the   Molecular Comment patient's clinical history.    Comment Normal Reference Range Trichomonas - Negative    Comment Normal Reference Ranger Chlamydia - Negative    Comment      Normal Reference Range Neisseria Gonorrhea - Negative  POCT  urinalysis dipstick     Status: Abnormal   Collection Time: 02/04/21  1:38 PM  Result Value Ref Range   Color, UA yellow    Clarity, UA clear    Glucose, UA Positive (A) Negative   Bilirubin, UA neg    Ketones, UA neg    Spec Grav, UA 1.015 1.010 - 1.025   Blood, UA neg    pH, UA 5.5 5.0 - 8.0   Protein, UA Negative Negative   Urobilinogen, UA 0.2 0.2 or 1.0 E.U./dL   Nitrite, UA neg    Leukocytes, UA Negative Negative   Appearance clear    Odor normal   CBC with Differential/Platelet     Status: None   Collection Time: 02/04/21  1:45 PM  Result Value Ref Range   WBC 8.5 3.8 - 10.8 Thousand/uL   RBC 5.11 4.20 - 5.80 Million/uL   Hemoglobin 16.3 13.2 - 17.1 g/dL   HCT 47.5 38.5 - 50.0 %   MCV 93.0 80.0 - 100.0 fL   MCH 31.9 27.0 - 33.0 pg   MCHC 34.3 32.0 - 36.0 g/dL   RDW 12.2 11.0 - 15.0 %   Platelets 284 140 - 400 Thousand/uL   MPV 10.8 7.5 - 12.5 fL   Neutro Abs 4,743 1,500 - 7,800 cells/uL   Lymphs Abs 2,788 850 - 3,900 cells/uL   Absolute Monocytes 850 200 - 950 cells/uL   Eosinophils Absolute 77 15 - 500 cells/uL   Basophils Absolute 43 0 - 200 cells/uL   Neutrophils Relative % 55.8 %   Total Lymphocyte 32.8 %   Monocytes Relative 10.0 %   Eosinophils Relative 0.9 %   Basophils Relative 0.5 %  COMPLETE METABOLIC PANEL WITH GFR     Status: Abnormal   Collection Time: 02/04/21  1:45 PM  Result Value Ref Range   Glucose, Bld 99 65 - 99  mg/dL    Comment: .            Fasting reference interval .    BUN 14 7 - 25 mg/dL   Creat 0.72 0.70 - 1.30 mg/dL   eGFR 106 > OR = 60 mL/min/1.68m    Comment: The eGFR is based on the CKD-EPI 2021 equation. To calculate  the new eGFR from a previous Creatinine or Cystatin C result, go to https://www.kidney.org/professionals/ kdoqi/gfr%5Fcalculator    BUN/Creatinine Ratio NOT APPLICABLE 6 - 22 (calc)   Sodium 139 135 - 146 mmol/L   Potassium 4.3 3.5 - 5.3 mmol/L   Chloride 102 98 - 110 mmol/L   CO2 25 20 - 32 mmol/L   Calcium 9.9 8.6 - 10.3 mg/dL   Total Protein 7.5 6.1 - 8.1 g/dL   Albumin 4.9 3.6 - 5.1 g/dL   Globulin 2.6 1.9 - 3.7 g/dL (calc)   AG Ratio 1.9 1.0 - 2.5 (calc)   Total Bilirubin 1.3 (H) 0.2 - 1.2 mg/dL   Alkaline phosphatase (APISO) 56 35 - 144 U/L   AST 16 10 - 35 U/L   ALT 20 9 - 46 U/L  Lipid panel     Status: Abnormal   Collection Time: 02/04/21  1:45 PM  Result Value Ref Range   Cholesterol 119 <200 mg/dL   HDL 36 (L) > OR = 40 mg/dL   Triglycerides 148 <150 mg/dL   LDL Cholesterol (Calc) 60 mg/dL (calc)    Comment: Reference range: <100 . Desirable range <100 mg/dL for primary prevention;   <70 mg/dL for patients with CHD  or diabetic patients  with > or = 2 CHD risk factors. Marland Kitchen LDL-C is now calculated using the Martin-Hopkins  calculation, which is a validated novel method providing  better accuracy than the Friedewald equation in the  estimation of LDL-C.  Cresenciano Genre et al. Annamaria Helling. 2694;854(62): 2061-2068  (http://education.[employer].com/faq/FAQ164)    Total CHOL/HDL Ratio 3.3 <5.0 (calc)   Non-HDL Cholesterol (Calc) 83 <130 mg/dL (calc)    Comment: For patients with diabetes plus 1 major ASCVD risk  factor, treating to a non-HDL-C goal of <100 mg/dL  (LDL-C of <70 mg/dL) is considered a therapeutic  option.   C-reactive protein     Status: None   Collection Time: 02/04/21  1:45 PM  Result Value Ref Range   CRP 0.7 <8.0 mg/L   Sedimentation rate     Status: None   Collection Time: 02/04/21  1:45 PM  Result Value Ref Range   Sed Rate 2 0 - 20 mm/h  POCT HgB A1C     Status: Abnormal   Collection Time: 02/22/21  9:23 AM  Result Value Ref Range   Hemoglobin A1C 7.4 (A) 4.0 - 5.6 %   HbA1c POC (<> result, manual entry)     HbA1c, POC (prediabetic range)     HbA1c, POC (controlled diabetic range)    Urinalysis, Complete w Microscopic     Status: Abnormal   Collection Time: 02/26/21 12:36 PM  Result Value Ref Range   Color, Urine YELLOW YELLOW   APPearance CLEAR CLEAR   Specific Gravity, Urine 1.020 1.005 - 1.030   pH 5.5 5.0 - 8.0   Glucose, UA >1,000 (A) NEGATIVE mg/dL   Hgb urine dipstick NEGATIVE NEGATIVE   Bilirubin Urine NEGATIVE NEGATIVE   Ketones, ur NEGATIVE NEGATIVE mg/dL   Protein, ur NEGATIVE NEGATIVE mg/dL   Nitrite NEGATIVE NEGATIVE   Leukocytes,Ua NEGATIVE NEGATIVE   Squamous Epithelial / LPF 0-5 0 - 5   WBC, UA 0-5 0 - 5 WBC/hpf   RBC / HPF 0-5 0 - 5 RBC/hpf   Bacteria, UA NONE SEEN NONE SEEN    Comment: Performed at [facility], [address], [place], [place] [address]     PHQ2/9: Depression screen [facility] 2/9 03/26/2021 02/22/2021 02/04/2021 11/15/2020 06/26/2020  Decreased Interest 0 0 0 0 0  Down, Depressed, Hopeless 0 0 0 0 0  PHQ - 2 Score 0 0 0 0 0  Altered sleeping 0 0 0 - -  Tired, decreased energy 0 0 0 - -  Change in appetite 0 0 0 - -  Feeling bad or failure about yourself  0 0 0 - -  Trouble concentrating 0 0 0 - -  Moving slowly or fidgety/restless 0 0 0 - -  Suicidal thoughts 0 0 0 - -  PHQ-9 Score 0 0 0 - -  Difficult doing work/chores - - Not difficult at all - -  Some recent data might be hidden    phq 9 is negative   Fall Risk: Fall Risk  03/26/2021 02/22/2021 02/04/2021 11/15/2020 06/26/2020  Falls in the past year? 0 0 0 0 0  Number falls in past yr: 0 - 0 0 0  Injury with Fall? 0 - 0 0 0  Risk for fall due to : No Fall Risks - - - -  Follow  up Falls prevention discussed Falls prevention discussed - - -      Functional Status Survey: Is the patient deaf or have difficulty hearing?:  No Does the patient have difficulty seeing, even when wearing glasses/contacts?: No Does the patient have difficulty concentrating, remembering, or making decisions?: No Does the patient have difficulty walking or climbing stairs?: No Does the patient have difficulty dressing or bathing?: No Does the patient have difficulty doing errands alone such as visiting a doctor's office or shopping?: No    Assessment & Plan  1. Viral URI with cough  - Novel Coronavirus, NAA ([facility]) - POCT Influenza A/B - chlorpheniramine-HYDROcodone (TUSSIONEX PENNKINETIC ER) 10-8 MG/5ML SUER; Take 5 mLs by mouth every 12 (twelve) hours as needed.  Dispense: 140 mL; Refill: 0   2. Influenza A  Feeling better, still advised to be home tomorrow, return to work on Thursday just in case also positive for COVID-19, fluids, rest and avoid exposing others .

## 2021-03-26 ENCOUNTER — Encounter: Payer: Self-pay | Admitting: Family Medicine

## 2021-03-26 ENCOUNTER — Other Ambulatory Visit: Payer: Self-pay

## 2021-03-26 ENCOUNTER — Ambulatory Visit: Payer: 59 | Admitting: Family Medicine

## 2021-03-26 VITALS — BP 132/68 | HR 106 | Temp 98.1°F | Resp 16 | Ht 68.0 in | Wt 215.0 lb

## 2021-03-26 DIAGNOSIS — J069 Acute upper respiratory infection, unspecified: Secondary | ICD-10-CM

## 2021-03-26 DIAGNOSIS — J101 Influenza due to other identified influenza virus with other respiratory manifestations: Secondary | ICD-10-CM

## 2021-03-26 LAB — POCT INFLUENZA A/B
Influenza A, POC: POSITIVE — AB
Influenza B, POC: NEGATIVE

## 2021-03-26 MED ORDER — HYDROCOD POLST-CPM POLST ER 10-8 MG/5ML PO SUER: 5.0000 mL | Freq: Two times a day (BID) | ORAL | 0 refills | Status: DC | PRN

## 2021-03-29 LAB — NOVEL CORONAVIRUS, NAA: SARS-CoV-2, NAA: NOT DETECTED

## 2021-03-29 LAB — SARS-COV-2, NAA 2 DAY TAT

## 2021-04-24 ENCOUNTER — Other Ambulatory Visit: Payer: Self-pay | Admitting: Family Medicine

## 2021-04-24 DIAGNOSIS — R252 Cramp and spasm: Secondary | ICD-10-CM

## 2021-04-24 MED ORDER — METAXALONE 800 MG PO TABS: 800.0000 mg | ORAL_TABLET | Freq: Three times a day (TID) | ORAL | 0 refills | Status: DC

## 2021-04-24 NOTE — Telephone Encounter (Signed)
Copied from [place] [phone]. Topic: Quick Communication - Rx Refill/Question >> Apr 24, 2021 10:04 AM Leward Quan A wrote: Medication: metaxalone (SKELAXIN) 800 MG tablet  Has the patient contacted their pharmacy? Yes.  Told to call since ne Rx needed (Agent: If no, request that the patient contact the pharmacy for the refill. If patient does not wish to contact the pharmacy document the reason why and proceed with request.) (Agent: If yes, when and what did the pharmacy advise?)  Preferred Pharmacy (with phone number or street name): [id number] [place] Phone:  [phone]:  [phone]   Has the patient been seen for an appointment in the last year OR does the patient have an upcoming appointment? Yes.    Agent: Please be advised that RX refills may take up to 3 business days. We ask that you follow-up with your pharmacy.

## 2021-04-24 NOTE — Telephone Encounter (Signed)
Requested medication (s) are due for refill today: yes  Requested medication (s) are on the active medication list: yes  Last refill:  01/12/20  Future visit scheduled: yes  Notes to clinic:  Unable to refill per protocol, cannot delegate.      Requested Prescriptions  Pending Prescriptions Disp Refills   metaxalone (SKELAXIN) 800 MG tablet 90 tablet 2    Sig: Take 1 tablet (800 mg total) by mouth 3 (three) times daily.     Not Delegated - Analgesics:  Muscle Relaxants Failed - 04/24/2021  2:19 PM      Failed - This refill cannot be delegated      Passed - Valid encounter within last 6 months    Recent Outpatient Visits           4 weeks ago Viral URI with cough   [facility] [place], Drue Stager, MD   2 months ago Dyslipidemia associated with type 2 diabetes mellitus [facility])   [facility] Steele Sizer, MD   2 months ago Lymphadenopathy   [facility] Serafina Royals F, FNP   5 months ago Type 2 diabetes mellitus with diabetic nephropathy, without long-term current use of insulin [facility])   [facility] Steele Sizer, MD   10 months ago Type 2 diabetes mellitus with diabetic nephropathy, without long-term current use of insulin [facility])   [facility] Steele Sizer, MD       Future Appointments             In 1 week Gollan, Kathlene November, MD [facility]   In 1 month Steele Sizer, MD [facility], [facility]

## 2021-05-01 ENCOUNTER — Other Ambulatory Visit: Payer: Self-pay | Admitting: Family Medicine

## 2021-05-01 DIAGNOSIS — E1121 Type 2 diabetes mellitus with diabetic nephropathy: Secondary | ICD-10-CM

## 2021-05-01 NOTE — Telephone Encounter (Signed)
Dose inconsistent with current med list. Requested Prescriptions  Pending Prescriptions Disp Refills   TRULICITY 8.48 LT/0.7DP SOPN [Pharmacy Med Name: TRULICITY 3.22 VO/7.2 ML PEN]  1    Sig: INJECT 0.75 MG INTO THE SKIN ONCE A WEEK.     Endocrinology:  Diabetes - GLP-1 Receptor Agonists Passed - 05/01/2021  1:39 AM      Passed - HBA1C is between 0 and 7.9 and within 180 days    Hemoglobin A1C  Date Value Ref Range Status  02/22/2021 7.4 (A) 4.0 - 5.6 % Final   HbA1c, POC (controlled diabetic range)  Date Value Ref Range Status  11/26/2018 5.7 0.0 - 7.0 % Final         Passed - Valid encounter within last 6 months    Recent Outpatient Visits          1 month ago Viral URI with cough   [facility] [place], Drue Stager, MD   2 months ago Dyslipidemia associated with type 2 diabetes mellitus [facility])   [facility] Steele Sizer, MD   2 months ago Lymphadenopathy   [facility] Serafina Royals F, FNP   5 months ago Type 2 diabetes mellitus with diabetic nephropathy, without long-term current use of insulin [facility])   [facility] [place], Drue Stager, MD   10 months ago Type 2 diabetes mellitus with diabetic nephropathy, without long-term current use of insulin [facility])   [facility] Steele Sizer, MD      Future Appointments            In 2 days Gollan, Kathlene November, MD [facility], [facility]   In 1 month Steele Sizer, MD [facility], [facility]

## 2021-05-03 ENCOUNTER — Telehealth (INDEPENDENT_AMBULATORY_CARE_PROVIDER_SITE_OTHER): Payer: 59 | Admitting: Nurse Practitioner

## 2021-05-03 ENCOUNTER — Ambulatory Visit: Payer: 59 | Admitting: Cardiovascular Disease

## 2021-05-03 ENCOUNTER — Encounter: Payer: Self-pay | Admitting: Nurse Practitioner

## 2021-05-03 ENCOUNTER — Other Ambulatory Visit: Payer: Self-pay

## 2021-05-03 DIAGNOSIS — U071 COVID-19: Secondary | ICD-10-CM | POA: Diagnosis not present

## 2021-05-03 MED ORDER — HYDROCOD POLI-CHLORPHE POLI ER 10-8 MG/5ML PO SUER: 5.0000 mL | Freq: Two times a day (BID) | ORAL | 0 refills | Status: DC | PRN

## 2021-05-03 MED ORDER — MOLNUPIRAVIR EUA 200MG CAPSULE: 4.0000 | ORAL_CAPSULE | Freq: Two times a day (BID) | ORAL | 0 refills | Status: AC

## 2021-05-03 NOTE — Progress Notes (Deleted)
Cardiology Office Note  Date:  05/03/2021   ID:  Jesse Lawrence., DOB 02-21-1961, MRN [id number]  PCP:  Jesse Sizer, MD   No chief complaint on file.   HPI:  Jesse Lawrence is a pleasant 61 year old gentleman with history of CAD, stent x 2   2002 and 2003,  ISR in 2007, TPA (cath 3 days later) COPD, long smoking history, stopped 3 years ago HTN DM II, HBA1C 8.3, previously greater than 9 Who presents for f/u of his coronary disease,  chest pain symptoms  Last seen in clinic by myself October 2021  Reports he is not interested in vaccination His family is having to get vaccinated for work  Lab work reviewed HBA1C 6.6 Total chol 119, LDL 57  Stress at home, recent loss of several family members  Sleeps 5 hrs a day Goes to bed 11 PM, gets up 4 AM Tired in the day No regular exercise program  Does not smoke, does dipping  Works in Museum/gallery conservator, works with Psychologist, clinical statin with Zetia  EKG personally reviewed by myself on todays visit  shows Normal sinus rhythm rate 103 bpm no significant ST or T-wave changes Rare PVC Reports heart rate elevated as he had energy drink today   PMH:   has a past medical history of Allergy, Anxiety, COPD (chronic obstructive pulmonary disease) ([place]), Diabetes mellitus without complication ([place]), Hyperlipidemia, Hypertension, Male impotence, MI (myocardial infarction) ([place]), Obesity, and PNA (pneumonia).  PSH:    Past Surgical History:  Procedure Laterality Date   cadiac stenting     CYSTECTOMY     61 years of age   KNEE ARTHROSCOPY      Current Outpatient Medications  Medication Sig Dispense Refill   atorvastatin (LIPITOR) 80 MG tablet Take 1 tablet (80 mg total) by mouth daily at 6 PM. 90 tablet 1   celecoxib (CELEBREX) 200 MG capsule Take 1 capsule (200 mg total) by mouth 2 (two) times daily. 20 capsule 0   chlorpheniramine-HYDROcodone (TUSSIONEX PENNKINETIC ER) 10-8 MG/5ML SUER Take 5 mLs by mouth every 12 (twelve) hours as  needed. 140 mL 0   clopidogrel (PLAVIX) 75 MG tablet TAKE 1 TABLET BY MOUTH EVERY DAY 90 tablet 0   Dulaglutide (TRULICITY) 1.5 ZG/0.1VC SOPN Inject 1.5 mg into the skin once a week. 6 mL 0   ezetimibe (ZETIA) 10 MG tablet Take 1 tablet (10 mg total) by mouth daily. 90 tablet 1   famotidine (PEPCID) 40 MG tablet Take 1 tablet (40 mg total) by mouth at bedtime. 90 tablet 1   fluticasone (FLONASE) 50 MCG/ACT nasal spray Place 2 sprays into both nostrils as needed. 48 g 1   Glucagon (GVOKE HYPOPEN 1-PACK) 1 MG/0.2ML SOAJ Inject 1 each into the skin daily as needed. If glucose very low and unable to eat/drink 0.2 mL 1   icosapent Ethyl (VASCEPA) 1 g capsule TAKE 2 CAPSULES BY MOUTH 2 (TWO) TIMES DAILY. 360 capsule 1   insulin glargine (LANTUS SOLOSTAR) 100 UNIT/ML Solostar Pen INJECT 20-50 UNITS INTO SKIN DAILY. 2 UNITS EVERY 3 DAYS TO KEEP FASTING BETWEEN 100-140 (MAX 50/DAY 15 mL 0   Insulin Pen Needle (BD PEN NEEDLE NANO 2ND GEN) 32G X 4 MM MISC Inject 1 each into the skin in the morning and at bedtime. 200 each 2   lisinopril (ZESTRIL) 2.5 MG tablet Take 1 tablet (2.5 mg total) by mouth daily. 90 tablet 1   loratadine (CLARITIN) 10 MG tablet Take 1 tablet by  mouth daily.     meloxicam (MOBIC) 15 MG tablet Take 1 tablet (15 mg total) by mouth daily as needed for pain. 30 tablet 0   metaxalone (SKELAXIN) 800 MG tablet Take 1 tablet (800 mg total) by mouth 3 (three) times daily. 90 tablet 0   metoprolol succinate (TOPROL-XL) 25 MG 24 hr tablet Take 0.5 tablets (12.5 mg total) by mouth daily. 45 tablet 1   ONETOUCH ULTRA test strip USE AS DIRECTED 100 strip 11   pantoprazole (PROTONIX) 40 MG tablet Take 1 tablet (40 mg total) by mouth in the morning. 90 tablet 1   sildenafil (VIAGRA) 100 MG tablet TAKE ONE-HALF TO ONE TABLET BY MOUTH DAILY AS NEEDED FOR FOR ERECTILE DYSFUNCTION 30 tablet 0   triamcinolone cream (KENALOG) 0.1 % Apply 1 application topically 2 (two) times daily. 30 g 0   XIGDUO XR  08-998 MG TB24 TAKE 1 TABLET BY MOUTH IN THE MORNING AND IN THE EVENING 180 tablet 1   No current facility-administered medications for this visit.     Allergies:   Patient has no known allergies.   Social History:  The patient  reports that he quit smoking about 8 years ago. His smoking use included cigarettes. He has a 45.00 pack-year smoking history. He uses smokeless tobacco. He reports that he does not drink alcohol and does not use drugs.   Family History:   family history includes CAD in his mother; Diabetes in his mother; Heart attack in his father and mother; Heart disease in his father and mother.    Review of Systems: Review of Systems  Constitutional: Negative.   HENT: Negative.    Respiratory: Negative.    Cardiovascular: Negative.   Gastrointestinal: Negative.   Musculoskeletal: Negative.   Neurological: Negative.   Psychiatric/Behavioral: Negative.    All other systems reviewed and are negative.  PHYSICAL EXAM: VS:  There were no vitals taken for this visit. , BMI There is no height or weight on file to calculate BMI. Constitutional:  oriented to person, place, and time. No distress.  HENT:  Head: Normocephalic and atraumatic.  Eyes:  no discharge. No scleral icterus.  Neck: Normal range of motion. Neck supple. No JVD present.  Cardiovascular: Normal rate, regular rhythm, normal heart sounds and intact distal pulses. Exam reveals no gallop and no friction rub. No edema No murmur heard. Pulmonary/Chest: Effort normal and breath sounds normal. No stridor. No respiratory distress.  no wheezes.  no rales.  no tenderness.  Abdominal: Soft.  no distension.  no tenderness.  Musculoskeletal: Normal range of motion.  no  tenderness or deformity.  Neurological:  normal muscle tone. Coordination normal. No atrophy Skin: Skin is warm and dry. No rash noted. not diaphoretic.  Psychiatric:  normal mood and affect. behavior is normal. Thought content normal.    Recent  Labs: 02/04/2021: ALT 20; BUN 14; Creat 0.72; Hemoglobin 16.3; Platelets 284; Potassium 4.3; Sodium 139    Lipid Panel Lab Results  Component Value Date   CHOL 119 02/04/2021   HDL 36 (L) 02/04/2021   LDLCALC 60 02/04/2021   TRIG 148 02/04/2021      Wt Readings from Last 3 Encounters:  03/26/21 215 lb (97.5 kg)  03/21/21 208 lb 1.8 oz (94.4 kg)  03/11/21 208 lb 1.8 oz (94.4 kg)       ASSESSMENT AND PLAN:  Coronary artery disease of native artery of native heart with stable angina pectoris (HCC)  Currently with no symptoms of angina.  No further workup at this time. Continue current medication regimen. Stable  History of coronary artery stent placement - 2 stents placed with history of in-stent restenosis Maintained on Plavix Denies anginal symptoms  Hypercholesteremia Continue statin with Zetia, goal LDL less than 70  Benign hypertension Reports having low blood pressure, metoprolol and lisinopril weaned down per his account  Hypertriglyceridemia Weight trending upwards, recommended strict diet, walking program  OSA (obstructive sleep apnea) Does not wear CPAP, sleeps 5 hours a night Fatigue in the day  Urine hesitancy Suggested he talk with primary care, may need Flomax  Tobacco abuse Quit smoking, uses dip  Sinus tachycardia Heart rate elevated which he attributes to drinking energy drink on his way in Does not want to increase his metoprolol dosing, reports he has periodic low blood pressure   Total encounter time more than 25 minutes  Greater than 50% was spent in counseling and coordination of care with the patient   No orders of the defined types were placed in this encounter.    Signed, Esmond Plants, M.D., Ph.D. 05/03/2021  [place], [place]

## 2021-05-03 NOTE — Progress Notes (Signed)
Name: Jesse Lawrence.   MRN: [id number]    DOB: October 05, 1960   Date:05/03/2021       Progress Note  Subjective  Chief Complaint  Chief Complaint  Patient presents with   Covid Positive    Fever, cough, congestion, bodyaches, chills for 3 days    I connected with  [name].  on 05/03/21 at  4:00 PM EST by a video enabled telemedicine application and verified that I am speaking with the correct person using two identifiers.  I discussed the limitations of evaluation and management by telemedicine and the availability of in person appointments. The patient expressed understanding and agreed to proceed with a virtual visit  Staff also discussed with the patient that there may be a patient responsible charge related to this service. Patient Location: home Provider Location: [facility] Additional Individuals present: alone  HPI  Covid-19: He says symptoms started Wednesday night.  Symptoms included fever, chills, cough and nasal congestion.  He says he took a Covid test on Thursday and it was positive. He says he has been taking tussionex (left over from previous illness), tylenol, ibuprofen, and zyrtec.  Discussed covid antiviral treatment and side effects and he would like to take it. Discussed OTC treatments, push fluids and get plenty of rest.  Discussed CDC recommendations for quarantine. Discussed concerning signs and symptoms to seek emergency care.   Patient Active Problem List   Diagnosis Date Noted   Coronary artery disease of native artery of native heart with stable angina pectoris ([place]) 11/26/2018   OSA (obstructive sleep apnea) 05/21/2015   Perennial allergic rhinitis 11/07/2014   Arteriosclerosis of coronary artery 11/07/2014   CD (contact dermatitis) 11/07/2014   Decreased libido 11/07/2014   Diabetes mellitus with renal manifestation ([place]) 11/07/2014   Gastro-esophageal reflux disease without esophagitis 11/07/2014   H/O acute myocardial infarction 11/07/2014    Hypercholesteremia 11/07/2014   Benign hypertension 11/07/2014   Hypertriglyceridemia 11/07/2014   H/O high risk medication treatment 11/07/2014   NASH (nonalcoholic steatohepatitis) 11/07/2014   Microalbuminuria 11/07/2014   Adult BMI 30+ 11/07/2014   Fungal infection of toenail 11/07/2014   Tobacco abuse 11/07/2014   ED (erectile dysfunction) of organic origin 09/29/2006    Social History   Tobacco Use   Smoking status: Former    Packs/day: 1.50    Years: 30.00    Pack years: 45.00    Types: Cigarettes    Quit date: 04/19/2013    Years since quitting: 8.0   Smokeless tobacco: Current  Substance Use Topics   Alcohol use: No    Alcohol/week: 0.0 standard drinks    Comment: occ     Current Outpatient Medications:    atorvastatin (LIPITOR) 80 MG tablet, Take 1 tablet (80 mg total) by mouth daily at 6 PM., Disp: 90 tablet, Rfl: 1   celecoxib (CELEBREX) 200 MG capsule, Take 1 capsule (200 mg total) by mouth 2 (two) times daily., Disp: 20 capsule, Rfl: 0   chlorpheniramine-HYDROcodone (TUSSIONEX PENNKINETIC ER) 10-8 MG/5ML SUER, Take 5 mLs by mouth every 12 (twelve) hours as needed., Disp: 140 mL, Rfl: 0   clopidogrel (PLAVIX) 75 MG tablet, TAKE 1 TABLET BY MOUTH EVERY DAY, Disp: 90 tablet, Rfl: 0   Dulaglutide (TRULICITY) 1.5 QQ/7.6PP SOPN, Inject 1.5 mg into the skin once a week., Disp: 6 mL, Rfl: 0   ezetimibe (ZETIA) 10 MG tablet, Take 1 tablet (10 mg total) by mouth daily., Disp: 90 tablet, Rfl: 1   famotidine (  PEPCID) 40 MG tablet, Take 1 tablet (40 mg total) by mouth at bedtime., Disp: 90 tablet, Rfl: 1   fluticasone (FLONASE) 50 MCG/ACT nasal spray, Place 2 sprays into both nostrils as needed., Disp: 48 g, Rfl: 1   Glucagon (GVOKE HYPOPEN 1-PACK) 1 MG/0.2ML SOAJ, Inject 1 each into the skin daily as needed. If glucose very low and unable to eat/drink, Disp: 0.2 mL, Rfl: 1   icosapent Ethyl (VASCEPA) 1 g capsule, TAKE 2 CAPSULES BY MOUTH 2 (TWO) TIMES DAILY., Disp: 360  capsule, Rfl: 1   insulin glargine (LANTUS SOLOSTAR) 100 UNIT/ML Solostar Pen, INJECT 20-50 UNITS INTO SKIN DAILY. 2 UNITS EVERY 3 DAYS TO KEEP FASTING BETWEEN 100-140 (MAX 50/DAY, Disp: 15 mL, Rfl: 0   Insulin Pen Needle (BD PEN NEEDLE NANO 2ND GEN) 32G X 4 MM MISC, Inject 1 each into the skin in the morning and at bedtime., Disp: 200 each, Rfl: 2   lisinopril (ZESTRIL) 2.5 MG tablet, Take 1 tablet (2.5 mg total) by mouth daily., Disp: 90 tablet, Rfl: 1   loratadine (CLARITIN) 10 MG tablet, Take 1 tablet by mouth daily., Disp: , Rfl:    meloxicam (MOBIC) 15 MG tablet, Take 1 tablet (15 mg total) by mouth daily as needed for pain., Disp: 30 tablet, Rfl: 0   metaxalone (SKELAXIN) 800 MG tablet, Take 1 tablet (800 mg total) by mouth 3 (three) times daily., Disp: 90 tablet, Rfl: 0   metoprolol succinate (TOPROL-XL) 25 MG 24 hr tablet, Take 0.5 tablets (12.5 mg total) by mouth daily., Disp: 45 tablet, Rfl: 1   ONETOUCH ULTRA test strip, USE AS DIRECTED, Disp: 100 strip, Rfl: 11   pantoprazole (PROTONIX) 40 MG tablet, Take 1 tablet (40 mg total) by mouth in the morning., Disp: 90 tablet, Rfl: 1   sildenafil (VIAGRA) 100 MG tablet, TAKE ONE-HALF TO ONE TABLET BY MOUTH DAILY AS NEEDED FOR FOR ERECTILE DYSFUNCTION, Disp: 30 tablet, Rfl: 0   triamcinolone cream (KENALOG) 0.1 %, Apply 1 application topically 2 (two) times daily., Disp: 30 g, Rfl: 0   XIGDUO XR 08-998 MG TB24, TAKE 1 TABLET BY MOUTH IN THE MORNING AND IN THE EVENING, Disp: 180 tablet, Rfl: 1  No Known Allergies  I personally reviewed active problem list, medication list, allergies, notes from last encounter with the patient/caregiver today.  ROS  Constitutional: Negative for fever or weight change.  Respiratory: Negative for cough and shortness of breath.   Cardiovascular: Negative for chest pain or palpitations.  Gastrointestinal: Negative for abdominal pain, no bowel changes.  Musculoskeletal: Negative for gait problem or joint  swelling.  Skin: Negative for rash.  Neurological: Negative for dizziness or headache.  No other specific complaints in a complete review of systems (except as listed in HPI above).   Objective  Virtual encounter, vitals not obtained.  There is no height or weight on file to calculate BMI.  Nursing Note and Vital Signs reviewed.  Physical Exam  Awake, alert and oriented, speaking in complete sentences  No results found for this or any previous visit (from the past 72 hour(s)).  Assessment & Plan  1. COVID-19 -push fluids, get rest -can take vitamin C,D and zinc -OTC mucinex, Flonase and zyrtec - molnupiravir EUA (LAGEVRIO) 200 mg CAPS capsule; Take 4 capsules (800 mg total) by mouth 2 (two) times daily for 5 days.  Dispense: 40 capsule; Refill: 0 - chlorpheniramine-HYDROcodone (TUSSIONEX PENNKINETIC ER) 10-8 MG/5ML; Take 5 mLs by mouth every 12 (twelve) hours as needed  for cough.  Dispense: 120 mL; Refill: 0   -Red flags and when to present for emergency care or RTC including fever >101.58F, chest pain, shortness of breath, new/worsening/un-resolving symptoms,  reviewed with patient at time of visit. Follow up and care instructions discussed and provided in AVS. - I discussed the assessment and treatment plan with the patient. The patient was provided an opportunity to ask questions and all were answered. The patient agreed with the plan and demonstrated an understanding of the instructions.  I provided 15 minutes of non-face-to-face time during this encounter.  Bo Merino, FNP

## 2021-05-06 ENCOUNTER — Other Ambulatory Visit: Payer: Self-pay | Admitting: Family Medicine

## 2021-05-06 DIAGNOSIS — I25118 Atherosclerotic heart disease of native coronary artery with other forms of angina pectoris: Secondary | ICD-10-CM

## 2021-05-06 DIAGNOSIS — E1121 Type 2 diabetes mellitus with diabetic nephropathy: Secondary | ICD-10-CM

## 2021-05-13 ENCOUNTER — Other Ambulatory Visit: Payer: Self-pay | Admitting: Family Medicine

## 2021-05-13 DIAGNOSIS — E1169 Type 2 diabetes mellitus with other specified complication: Secondary | ICD-10-CM

## 2021-05-13 DIAGNOSIS — E1121 Type 2 diabetes mellitus with diabetic nephropathy: Secondary | ICD-10-CM

## 2021-05-29 ENCOUNTER — Encounter: Payer: Self-pay | Admitting: Cardiovascular Disease

## 2021-05-29 ENCOUNTER — Other Ambulatory Visit: Payer: Self-pay

## 2021-05-29 ENCOUNTER — Ambulatory Visit: Payer: 59 | Admitting: Cardiovascular Disease

## 2021-05-29 DIAGNOSIS — E7800 Pure hypercholesterolemia, unspecified: Secondary | ICD-10-CM | POA: Diagnosis not present

## 2021-05-29 DIAGNOSIS — E1169 Type 2 diabetes mellitus with other specified complication: Secondary | ICD-10-CM

## 2021-05-29 DIAGNOSIS — N529 Male erectile dysfunction, unspecified: Secondary | ICD-10-CM

## 2021-05-29 DIAGNOSIS — I10 Essential (primary) hypertension: Secondary | ICD-10-CM

## 2021-05-29 DIAGNOSIS — I25118 Atherosclerotic heart disease of native coronary artery with other forms of angina pectoris: Secondary | ICD-10-CM | POA: Diagnosis not present

## 2021-05-29 DIAGNOSIS — E785 Hyperlipidemia, unspecified: Secondary | ICD-10-CM

## 2021-05-29 DIAGNOSIS — E1121 Type 2 diabetes mellitus with diabetic nephropathy: Secondary | ICD-10-CM

## 2021-05-29 MED ORDER — LISINOPRIL 2.5 MG PO TABS: 2.5000 mg | ORAL_TABLET | Freq: Every day | ORAL | 3 refills | Status: DC

## 2021-05-29 MED ORDER — EZETIMIBE 10 MG PO TABS: 10.0000 mg | ORAL_TABLET | Freq: Every day | ORAL | 3 refills | Status: DC

## 2021-05-29 MED ORDER — METOPROLOL SUCCINATE ER 25 MG PO TB24: 12.5000 mg | ORAL_TABLET | Freq: Every day | ORAL | 3 refills | Status: DC

## 2021-05-29 MED ORDER — CLOPIDOGREL BISULFATE 75 MG PO TABS: 75.0000 mg | ORAL_TABLET | Freq: Every day | ORAL | 3 refills | Status: DC

## 2021-05-29 NOTE — Progress Notes (Signed)
Cardiology Office Note  Date:  05/29/2021   ID:  Jesse Raju., DOB 06-Mar-1961, MRN [id number]  PCP:  Steele Sizer, MD   Chief Complaint  Patient presents with   12 month follow up     Patient c/o shortness of breath with walking up a hill. Medications reviewed by the patient verbally.      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, thinks it is conditioning Denies having any of his typical anginal symptoms Non-smoker  Diabetes numbers improving, A1c was above 10 now in the mid 7 range Try to watch his diet Weight trended up past several months  Lab work reviewed HBA1C 7.4 Total chol 119, LDL 57  On statin with Zetia  EKG personally reviewed by myself on todays visit Normal sinus rhythm rate 84 bpm no significant ST or T wave changes   PMH:   has a past medical history of Allergy, Anxiety, COPD (chronic obstructive pulmonary disease) ([place]), Diabetes mellitus without complication ([place]), Hyperlipidemia, Hypertension, Male impotence, MI (myocardial infarction) ([place]), Obesity, and PNA (pneumonia).  PSH:    Past Surgical History:  Procedure Laterality Date   cadiac stenting     CYSTECTOMY     61 years of age   KNEE ARTHROSCOPY      Current Outpatient Medications  Medication Sig Dispense Refill   atorvastatin (LIPITOR) 80 MG tablet Take 1 tablet (80 mg total) by mouth daily at 6 PM. 90 tablet 1   celecoxib (CELEBREX) 200 MG capsule Take 1 capsule  (200 mg total) by mouth 2 (two) times daily. 20 capsule 0   chlorpheniramine-HYDROcodone (TUSSIONEX PENNKINETIC ER) 10-8 MG/5ML Take 5 mLs by mouth every 12 (twelve) hours as needed for cough. 120 mL 0   clopidogrel (PLAVIX) 75 MG tablet TAKE 1 TABLET BY MOUTH EVERY DAY 90 tablet 0   ezetimibe (ZETIA) 10 MG tablet Take 1 tablet (10 mg total) by mouth daily. 90 tablet 1   famotidine (PEPCID) 40 MG tablet Take 1 tablet (40 mg total) by mouth at bedtime. 90 tablet 1   fluticasone (FLONASE) 50 MCG/ACT nasal spray Place 2 sprays into both nostrils as needed. 48 g 1   icosapent Ethyl (VASCEPA) 1 g capsule TAKE 2 CAPSULES BY MOUTH 2 (TWO) TIMES DAILY. 360 capsule 1   Insulin Pen Needle (BD PEN NEEDLE NANO 2ND GEN) 32G X 4 MM MISC Inject 1 each into the skin in the morning and at bedtime. 200 each 2   LANTUS SOLOSTAR 100 UNIT/ML Solostar Pen INJECT 20-50 UNITS INTO SKIN DAILY. 2 UNITS EVERY 3 DAYS TO KEEP FASTING BETWEEN 100-140 (MAX 50/DAY 15 mL 1   lisinopril (ZESTRIL) 2.5 MG tablet Take 1 tablet (2.5 mg total) by mouth daily. 90 tablet 1   loratadine (CLARITIN) 10 MG tablet Take 1 tablet by mouth daily.     meloxicam (MOBIC) 15 MG tablet Take 1 tablet (15 mg total) by mouth daily as  needed for pain. 30 tablet 0   metaxalone (SKELAXIN) 800 MG tablet Take 1 tablet (800 mg total) by mouth 3 (three) times daily. 90 tablet 0   metoprolol succinate (TOPROL-XL) 25 MG 24 hr tablet Take 0.5 tablets (12.5 mg total) by mouth daily. 45 tablet 1   ONETOUCH ULTRA test strip USE AS DIRECTED 100 strip 11   pantoprazole (PROTONIX) 40 MG tablet Take 1 tablet (40 mg total) by mouth in the morning. 90 tablet 1   sildenafil (VIAGRA) 100 MG tablet TAKE ONE-HALF TO ONE TABLET BY MOUTH DAILY AS NEEDED FOR FOR ERECTILE DYSFUNCTION 30 tablet 0   triamcinolone cream (KENALOG) 0.1 % Apply 1 application topically 2 (two) times daily. 30 g 0   TRULICITY 1.5 GD/9.2EQ SOPN INJECT 1.5 MG INTO THE SKIN ONCE A WEEK. 6 mL 0   XIGDUO XR  08-998 MG TB24 TAKE 1 TABLET BY MOUTH IN THE MORNING AND IN THE EVENING 180 tablet 1   Glucagon (GVOKE HYPOPEN 1-PACK) 1 MG/0.2ML SOAJ Inject 1 each into the skin daily as needed. If glucose very low and unable to eat/drink (Patient not taking: Reported on 05/29/2021) 0.2 mL 1   No current facility-administered medications for this visit.     Allergies:   Patient has no known allergies.   Social History:  The patient  reports that he quit smoking about 8 years ago. His smoking use included cigarettes. He has a 45.00 pack-year smoking history. He uses smokeless tobacco. He reports that he does not drink alcohol and does not use drugs.   Family History:   family history includes CAD in his mother; Diabetes in his mother; Heart attack in his father and mother; Heart disease in his father and mother.    Review of Systems: Review of Systems  Constitutional: Negative.   HENT: Negative.    Respiratory: Negative.    Cardiovascular: Negative.   Gastrointestinal: Negative.   Musculoskeletal: Negative.   Neurological: Negative.   Psychiatric/Behavioral: Negative.    All other systems reviewed and are negative.  PHYSICAL EXAM: VS:  BP 112/70 (BP Location: Left Arm, Patient Position: Sitting, Cuff Size: Normal)    Pulse 84    Ht 5' 8"  (1.727 m)    Wt 214 lb 2 oz (97.1 kg)    SpO2 96%    BMI 32.56 kg/m  , BMI Body mass index is 32.56 kg/m. Constitutional:  oriented to person, place, and time. No distress.  HENT:  Head: Grossly normal Eyes:  no discharge. No scleral icterus.  Neck: No JVD, no carotid bruits  Cardiovascular: Regular rate and rhythm, no murmurs appreciated Pulmonary/Chest: Clear to auscultation bilaterally, no wheezes or rails Abdominal: Soft.  no distension.  no tenderness.  Musculoskeletal: Normal range of motion Neurological:  normal muscle tone. Coordination normal. No atrophy Skin: Skin warm and dry Psychiatric: normal affect, pleasant  Recent Labs: 02/04/2021: ALT 20;  BUN 14; Creat 0.72; Hemoglobin 16.3; Platelets 284; Potassium 4.3; Sodium 139    Lipid Panel Lab Results  Component Value Date   CHOL 119 02/04/2021   HDL 36 (L) 02/04/2021   LDLCALC 60 02/04/2021   TRIG 148 02/04/2021      Wt Readings from Last 3 Encounters:  05/29/21 214 lb 2 oz (97.1 kg)  03/26/21 215 lb (97.5 kg)  03/21/21 208 lb 1.8 oz (94.4 kg)     ASSESSMENT AND PLAN:  Coronary artery disease of native artery of native heart with stable angina pectoris (HCC)  Currently with  no symptoms of angina. No further workup at this time. Continue current medication regimen. Long discussion concerning anginal symptoms to watch for continued  History of coronary artery stent placement - 2 stents placed with history of in-stent restenosis Continue Plavix  Hypercholesteremia Cholesterol is at goal on the current lipid regimen. No changes to the medications were made.  Diabetes type 2 with complications We have encouraged continued exercise, careful diet management in an effort to lose weight.  Benign hypertension Blood pressure is well controlled on today's visit. No changes made to the medications.  OSA (obstructive sleep apnea) On prior visit not on CPAP  Tobacco abuse Quit smoking, uses dip    Total encounter time more than 30 minutes  Greater than 50% was spent in counseling and coordination of care with the patient   No orders of the defined types were placed in this encounter.    Signed, Esmond Plants, M.D., Ph.D. 05/29/2021  [place], [place]

## 2021-05-29 NOTE — Patient Instructions (Signed)
Medication Instructions:  No changes  If you need a refill on your cardiac medications before your next appointment, please call your pharmacy.   Lab work: No new labs needed  Testing/Procedures: No new testing needed  Follow-Up: At [facility], you and your health needs are our priority.  As part of our continuing mission to provide you with exceptional heart care, we have created designated Provider Care Teams.  These Care Teams include your primary Cardiologist (physician) and Advanced Practice Providers (APPs -  Physician Assistants and Nurse Practitioners) who all work together to provide you with the care you need, when you need it.  You will need a follow up appointment in 12 months  Providers on your designated Care Team:   Christopher Berge, NP Ryan Dunn, PA-C Cadence Furth, PA-C  COVID-19 Vaccine Information can be found at: [email or web address] For questions related to vaccine distribution or appointments, please email [email or web address] or call [phone].   

## 2021-06-05 NOTE — Progress Notes (Signed)
Name: Jesse Lawrence.   MRN: [id number]    DOB: October 06, 1960   Date:06/06/2021       Progress Note  Subjective  Chief Complaint  Follow up   HPI  DM II :   His A1C was at goal for a long time, however had a gap of insurance and medication, A1C was up to 10.7 % last visit was  down to 7.4 %, currently on Trulicity 1.5, Xigduo and Lantus 40 units daily. He has associated  ED, HTN and dyslipidemia. Urine micro was high back in May 22  he is on ACE. We will recheck urine micro today. He states glucose has been well controlled at home, except for this am it went up to 152 fasting, he took cough syrup last night.  Productive cough: he has COVID-19 05/03/2021, he was feeling well until a couple of days ago when he noticed nasal congestion, no rhinorrhea, no fever or chill, but has noticed some SOB and chest congestion with a productive cough - whitish in color. No sick contact, he denies change in appetite.   HTN/CAD:no palpitation, denies chest pain he has a history of angina but controlled with medication, on low dose ACE and half pill metoprolol , he has been taking Atorvastatin and Zetia , he is still out of Vascepa - he states the coupon is in his car.  .He sees cardiologist - Dr. Rockey Situ had a visit last week.    ED: he has difficulty maintaining and erection has been doing well on Viagra, but has pain with erection that radiates to his right testicle. He developed right groin pain last Fall, he was seen by Dr. Chrystine Oiler  took antibiotics and [place] just showed small hydrocele    OSA: he has not been compliant with CPAP machine, he cannot tolerated it, he is aware of the importance of wearing it . Unchanged    GERD: he states symptoms have improved, he is taking medication as prescribed but if he skips he needs to take tums all day. Unchanged   Obesity:weight is stable, unchanged even with higher dose of Trulicity   Hematospermia: noticed about one month ago, shortly after COVID-19, married and same  sexual partner. No odor, no dysuria, no penile discharge. No change in urinary frequency, we will check for infection and follow up with Urologist if no resolution and tests negative    Patient Active Problem List   Diagnosis Date Noted   Coronary artery disease of native artery of native heart with stable angina pectoris ([place]) 11/26/2018   OSA (obstructive sleep apnea) 05/21/2015   Perennial allergic rhinitis 11/07/2014   Arteriosclerosis of coronary artery 11/07/2014   CD (contact dermatitis) 11/07/2014   Decreased libido 11/07/2014   Diabetes mellitus with renal manifestation ([place]) 11/07/2014   Gastro-esophageal reflux disease without esophagitis 11/07/2014   H/O acute myocardial infarction 11/07/2014   Hypercholesteremia 11/07/2014   Benign hypertension 11/07/2014   Hypertriglyceridemia 11/07/2014   H/O high risk medication treatment 11/07/2014   NASH (nonalcoholic steatohepatitis) 11/07/2014   Microalbuminuria 11/07/2014   Adult BMI 30+ 11/07/2014   Fungal infection of toenail 11/07/2014   Tobacco abuse 11/07/2014   ED (erectile dysfunction) of organic origin 09/29/2006    Past Surgical History:  Procedure Laterality Date   cadiac stenting     CYSTECTOMY     61 years of age   KNEE ARTHROSCOPY      Family History  Problem Relation Age of Onset   Diabetes Mother  CAD Mother    Heart disease Mother    Heart attack Mother    Heart disease Father    Heart attack Father     Social History   Tobacco Use   Smoking status: Former    Packs/day: 1.50    Years: 30.00    Pack years: 45.00    Types: Cigarettes    Quit date: 04/19/2013    Years since quitting: 8.1   Smokeless tobacco: Current  Substance Use Topics   Alcohol use: No    Alcohol/week: 0.0 standard drinks    Comment: occ     Current Outpatient Medications:    atorvastatin (LIPITOR) 80 MG tablet, Take 1 tablet (80 mg total) by mouth daily at 6 PM., Disp: 90 tablet, Rfl: 1   celecoxib (CELEBREX) 200  MG capsule, Take 1 capsule (200 mg total) by mouth 2 (two) times daily., Disp: 20 capsule, Rfl: 0   chlorpheniramine-HYDROcodone (TUSSIONEX PENNKINETIC ER) 10-8 MG/5ML, Take 5 mLs by mouth every 12 (twelve) hours as needed for cough., Disp: 120 mL, Rfl: 0   clopidogrel (PLAVIX) 75 MG tablet, Take 1 tablet (75 mg total) by mouth daily., Disp: 90 tablet, Rfl: 3   ezetimibe (ZETIA) 10 MG tablet, Take 1 tablet (10 mg total) by mouth daily., Disp: 90 tablet, Rfl: 3   famotidine (PEPCID) 40 MG tablet, Take 1 tablet (40 mg total) by mouth at bedtime., Disp: 90 tablet, Rfl: 1   fluticasone (FLONASE) 50 MCG/ACT nasal spray, Place 2 sprays into both nostrils as needed., Disp: 48 g, Rfl: 1   Glucagon (GVOKE HYPOPEN 1-PACK) 1 MG/0.2ML SOAJ, Inject 1 each into the skin daily as needed. If glucose very low and unable to eat/drink, Disp: 0.2 mL, Rfl: 1   icosapent Ethyl (VASCEPA) 1 g capsule, TAKE 2 CAPSULES BY MOUTH 2 (TWO) TIMES DAILY., Disp: 360 capsule, Rfl: 1   Insulin Pen Needle (BD PEN NEEDLE NANO 2ND GEN) 32G X 4 MM MISC, Inject 1 each into the skin in the morning and at bedtime., Disp: 200 each, Rfl: 2   LANTUS SOLOSTAR 100 UNIT/ML Solostar Pen, INJECT 20-50 UNITS INTO SKIN DAILY. 2 UNITS EVERY 3 DAYS TO KEEP FASTING BETWEEN 100-140 (MAX 50/DAY, Disp: 15 mL, Rfl: 1   lisinopril (ZESTRIL) 2.5 MG tablet, Take 1 tablet (2.5 mg total) by mouth daily., Disp: 90 tablet, Rfl: 3   loratadine (CLARITIN) 10 MG tablet, Take 1 tablet by mouth daily., Disp: , Rfl:    meloxicam (MOBIC) 15 MG tablet, Take 1 tablet (15 mg total) by mouth daily as needed for pain., Disp: 30 tablet, Rfl: 0   metaxalone (SKELAXIN) 800 MG tablet, Take 1 tablet (800 mg total) by mouth 3 (three) times daily., Disp: 90 tablet, Rfl: 0   metoprolol succinate (TOPROL-XL) 25 MG 24 hr tablet, Take 0.5 tablets (12.5 mg total) by mouth daily., Disp: 45 tablet, Rfl: 3   ONETOUCH ULTRA test strip, USE AS DIRECTED, Disp: 100 strip, Rfl: 11   pantoprazole  (PROTONIX) 40 MG tablet, Take 1 tablet (40 mg total) by mouth in the morning., Disp: 90 tablet, Rfl: 1   sildenafil (VIAGRA) 100 MG tablet, TAKE ONE-HALF TO ONE TABLET BY MOUTH DAILY AS NEEDED FOR FOR ERECTILE DYSFUNCTION, Disp: 30 tablet, Rfl: 0   triamcinolone cream (KENALOG) 0.1 %, Apply 1 application topically 2 (two) times daily., Disp: 30 g, Rfl: 0   TRULICITY 1.5 LE/7.5TZ SOPN, INJECT 1.5 MG INTO THE SKIN ONCE A WEEK., Disp: 6 mL, Rfl:  0   XIGDUO XR 08-998 MG TB24, TAKE 1 TABLET BY MOUTH IN THE MORNING AND IN THE EVENING, Disp: 180 tablet, Rfl: 1  No Known Allergies  I personally reviewed active problem list, medication list, allergies, family history, social history with the patient/caregiver today.   ROS  Constitutional: Negative for fever or weight change.  Respiratory: positive  for cough and shortness of breath.   Cardiovascular: Negative for chest pain or palpitations.  Gastrointestinal: Negative for abdominal pain, no bowel changes.  Musculoskeletal: Negative for gait problem or joint swelling.  Skin: Negative for rash.  Neurological: Negative for dizziness or headache.  No other specific complaints in a complete review of systems (except as listed in HPI above).   Objective  Vitals:   06/06/21 0942  BP: 134/72  Pulse: 94  Resp: 16  SpO2: 95%  Weight: 214 lb (97.1 kg)  Height: 5' 8"  (1.727 m)    Body mass index is 32.54 kg/m.  Physical Exam  Constitutional: Patient appears well-developed and well-nourished. Obese  No distress.  HEENT: head atraumatic, normocephalic, pupils equal and reactive to light, neck supple Cardiovascular: Normal rate, regular rhythm and normal heart sounds.  No murmur heard. No BLE edema. Pulmonary/Chest: Effort normal and breath sounds normal. No respiratory distress. Abdominal: Soft.  There is no tenderness. Psychiatric: Patient has a normal mood and affect. behavior is normal. Judgment and thought content normal.   Recent Results  (from the past 2160 hour(s))  POCT Influenza A/B     Status: Abnormal   Collection Time: 03/26/21  4:04 PM  Result Value Ref Range   Influenza A, POC Positive (A) Negative   Influenza B, POC Negative Negative  Novel Coronavirus, NAA ([facility])     Status: None   Collection Time: 03/27/21  3:38 AM   Specimen: Nasopharyngeal(NP) swabs in vial transport medium   Nasopharynge  Previous  Result Value Ref Range   SARS-CoV-2, NAA Not Detected Not Detected    Comment: This nucleic acid amplification test was developed and its performance characteristics determined by [employer]. Nucleic acid amplification tests include RT-PCR and TMA. This test has not been FDA cleared or approved. This test has been authorized by FDA under an Emergency Use Authorization (EUA). This test is only authorized for the duration of time the declaration that circumstances exist justifying the authorization of the emergency use of in vitro diagnostic tests for detection of SARS-CoV-2 virus and/or diagnosis of COVID-19 infection under section 564(b)(1) of the Act, 21 U.S.C. 383ANV-9(T) (1), unless the authorization is terminated or revoked sooner. When diagnostic testing is negative, the possibility of a false negative result should be considered in the context of a patient's recent exposures and the presence of clinical signs and symptoms consistent with COVID-19. An individual without symptoms of COVID-19 and who is not shedding SARS-CoV-2 virus wo uld expect to have a negative (not detected) result in this assay.   SARS-COV-2, NAA 2 DAY TAT     Status: None   Collection Time: 03/27/21  3:38 AM   Nasopharynge  Previous  Result Value Ref Range   SARS-CoV-2, NAA 2 DAY TAT Performed      PHQ2/9: Depression screen [facility] 2/9 06/06/2021 05/03/2021 03/26/2021 02/22/2021 02/04/2021  Decreased Interest 0 0 0 0 0  Down, Depressed, Hopeless 0 0 0 0 0  PHQ - 2 Score 0 0 0 0 0  Altered sleeping 0 - 0 0 0  Tired,  decreased energy 0 - 0 0 0  Change in appetite 0 - 0 0 0  Feeling bad or failure about yourself  0 - 0 0 0  Trouble concentrating 0 - 0 0 0  Moving slowly or fidgety/restless 0 - 0 0 0  Suicidal thoughts 0 - 0 0 0  PHQ-9 Score 0 - 0 0 0  Difficult doing work/chores - - - - Not difficult at all  Some recent data might be hidden    phq 9 is negative   Fall Risk: Fall Risk  06/06/2021 05/03/2021 03/26/2021 02/22/2021 02/04/2021  Falls in the past year? 0 0 0 0 0  Number falls in past yr: 0 0 0 - 0  Injury with Fall? 0 0 0 - 0  Risk for fall due to : No Fall Risks - No Fall Risks - -  Follow up Falls prevention discussed Falls evaluation completed Falls prevention discussed Falls prevention discussed -     Functional Status Survey: Is the patient deaf or have difficulty hearing?: No Does the patient have difficulty seeing, even when wearing glasses/contacts?: No Does the patient have difficulty concentrating, remembering, or making decisions?: No Does the patient have difficulty walking or climbing stairs?: No Does the patient have difficulty dressing or bathing?: No Does the patient have difficulty doing errands alone such as visiting a doctor's office or shopping?: No    Assessment & Plan  1. Type 2 diabetes mellitus with diabetic nephropathy, without long-term current use of insulin (HCC)  - HgB A1c  2. Dyslipidemia associated with type 2 diabetes mellitus ([place])   3. Coronary artery disease of native artery of native heart with stable angina pectoris ([place])  Doing well at this time  4. Productive cough  - umeclidinium-vilanterol (ANORO ELLIPTA) 62.5-25 MCG/ACT AEPB; Inhale 1 puff into the lungs daily at 6 (six) AM.  Dispense: 1 each; Refill: 0  5. Gastro-esophageal reflux disease without esophagitis   6. NASH (nonalcoholic steatohepatitis)   7. Hematospermia  - Urine Culture - Urinalysis, Complete  8. Hypercholesteremia   9. Benign hypertension

## 2021-06-06 ENCOUNTER — Ambulatory Visit: Payer: 59 | Admitting: Family Medicine

## 2021-06-06 ENCOUNTER — Encounter: Payer: Self-pay | Admitting: Family Medicine

## 2021-06-06 VITALS — BP 134/72 | HR 94 | Resp 16 | Ht 68.0 in | Wt 214.0 lb

## 2021-06-06 DIAGNOSIS — E1121 Type 2 diabetes mellitus with diabetic nephropathy: Secondary | ICD-10-CM | POA: Diagnosis not present

## 2021-06-06 DIAGNOSIS — R058 Other specified cough: Secondary | ICD-10-CM

## 2021-06-06 DIAGNOSIS — I25118 Atherosclerotic heart disease of native coronary artery with other forms of angina pectoris: Secondary | ICD-10-CM | POA: Diagnosis not present

## 2021-06-06 DIAGNOSIS — E785 Hyperlipidemia, unspecified: Secondary | ICD-10-CM

## 2021-06-06 DIAGNOSIS — K219 Gastro-esophageal reflux disease without esophagitis: Secondary | ICD-10-CM

## 2021-06-06 DIAGNOSIS — R361 Hematospermia: Secondary | ICD-10-CM

## 2021-06-06 DIAGNOSIS — I10 Essential (primary) hypertension: Secondary | ICD-10-CM

## 2021-06-06 DIAGNOSIS — K7581 Nonalcoholic steatohepatitis (NASH): Secondary | ICD-10-CM

## 2021-06-06 DIAGNOSIS — E1169 Type 2 diabetes mellitus with other specified complication: Secondary | ICD-10-CM | POA: Diagnosis not present

## 2021-06-06 DIAGNOSIS — Z23 Encounter for immunization: Secondary | ICD-10-CM

## 2021-06-06 DIAGNOSIS — E7800 Pure hypercholesterolemia, unspecified: Secondary | ICD-10-CM

## 2021-06-06 MED ORDER — UMECLIDINIUM-VILANTEROL 62.5-25 MCG/ACT IN AEPB: 1.0000 | INHALATION_SPRAY | Freq: Every day | RESPIRATORY_TRACT | 0 refills | Status: DC

## 2021-06-07 ENCOUNTER — Ambulatory Visit: Payer: 59 | Admitting: Family Medicine

## 2021-06-07 LAB — URINALYSIS, COMPLETE
Bacteria, UA: NONE SEEN /HPF
Bilirubin Urine: NEGATIVE
Hgb urine dipstick: NEGATIVE
Hyaline Cast: NONE SEEN /LPF
Ketones, ur: NEGATIVE
Leukocytes,Ua: NEGATIVE
Nitrite: NEGATIVE
Protein, ur: NEGATIVE
RBC / HPF: NONE SEEN /HPF (ref 0–2)
Specific Gravity, Urine: 1.031 (ref 1.001–1.035)
Squamous Epithelial / HPF: NONE SEEN /HPF (ref <=5)
WBC, UA: NONE SEEN /HPF (ref 0–5)
pH: 6 (ref 5.0–8.0)

## 2021-06-07 LAB — HEMOGLOBIN A1C
Hgb A1c MFr Bld: 8.3 % of total Hgb — ABNORMAL HIGH (ref <5.7)
Mean Plasma Glucose: 192 mg/dL
eAG (mmol/L): 10.6 mmol/L

## 2021-06-07 LAB — URINE CULTURE
MICRO NUMBER:: 13080770
Result:: NO GROWTH
SPECIMEN QUALITY:: ADEQUATE

## 2021-06-15 ENCOUNTER — Other Ambulatory Visit: Payer: Self-pay | Admitting: Family Medicine

## 2021-06-15 DIAGNOSIS — E1169 Type 2 diabetes mellitus with other specified complication: Secondary | ICD-10-CM

## 2021-06-18 ENCOUNTER — Encounter: Payer: Self-pay | Admitting: Urology

## 2021-06-18 ENCOUNTER — Other Ambulatory Visit: Payer: Self-pay

## 2021-06-18 ENCOUNTER — Other Ambulatory Visit
Admission: RE | Admit: 2021-06-18 | Discharge: 2021-06-18 | Disposition: A | Discharge status: Home / Self Care | Payer: 59 | Attending: Urology | Admitting: Urology

## 2021-06-18 ENCOUNTER — Ambulatory Visit: Payer: 59 | Admitting: Urology

## 2021-06-18 VITALS — BP 124/78 | HR 88 | Ht 68.0 in | Wt 223.0 lb

## 2021-06-18 DIAGNOSIS — R361 Hematospermia: Secondary | ICD-10-CM | POA: Diagnosis not present

## 2021-06-18 DIAGNOSIS — R058 Other specified cough: Secondary | ICD-10-CM

## 2021-06-18 DIAGNOSIS — Z125 Encounter for screening for malignant neoplasm of prostate: Secondary | ICD-10-CM | POA: Diagnosis present

## 2021-06-18 MED ORDER — UMECLIDINIUM-VILANTEROL 62.5-25 MCG/ACT IN AEPB: 1.0000 | INHALATION_SPRAY | Freq: Every day | RESPIRATORY_TRACT | 0 refills | Status: DC

## 2021-06-18 NOTE — Progress Notes (Signed)
? ?  06/18/2021 ?3:59 PM  ? ?Jesse Lawrence ?01-Feb-1961 ?[id number] ? ?Reason for visit: Hematospermia ? ?HPI: ?61 year old male I have seen previously for scrotal pain who reports about a month of hematospermia that has improved.  He denies any trauma or risk of STDs.  Denies any gross hematuria or urinary symptoms.  Urinalysis with PCP was completely benign and culture was negative.  Last PSA was normal at 1.1 in March 2021. ? ?DRE today 50 g smooth prostate with no nodules or masses. ? ?We discussed that hematospermia is typically a benign process, and DRE is benign today.  Urinalysis also negative.  I recommended PSA per routine screening guidelines and with his hematospermia, and will call with those results. ? ?PSA today, call with results ? ?Billey Co, MD ? ?[facility] ?[address], [address] ?[place], [place] [address] ?[phone] ? ? ?

## 2021-06-19 LAB — PSA, TOTAL AND FREE
PSA, Free Pct: 28.1 %
PSA, Free: 0.45 ng/mL
Prostate Specific Ag, Serum: 1.6 ng/mL (ref 0.0–4.0)

## 2021-07-06 ENCOUNTER — Other Ambulatory Visit: Payer: Self-pay | Admitting: Family Medicine

## 2021-07-06 DIAGNOSIS — N529 Male erectile dysfunction, unspecified: Secondary | ICD-10-CM

## 2021-07-09 ENCOUNTER — Other Ambulatory Visit: Payer: Self-pay | Admitting: Family Medicine

## 2021-07-09 DIAGNOSIS — E1121 Type 2 diabetes mellitus with diabetic nephropathy: Secondary | ICD-10-CM

## 2021-08-01 ENCOUNTER — Other Ambulatory Visit: Payer: Self-pay | Admitting: Family Medicine

## 2021-08-01 DIAGNOSIS — E1169 Type 2 diabetes mellitus with other specified complication: Secondary | ICD-10-CM

## 2021-08-01 DIAGNOSIS — E1121 Type 2 diabetes mellitus with diabetic nephropathy: Secondary | ICD-10-CM

## 2021-08-06 ENCOUNTER — Other Ambulatory Visit: Payer: Self-pay | Admitting: Family Medicine

## 2021-08-06 DIAGNOSIS — E1121 Type 2 diabetes mellitus with diabetic nephropathy: Secondary | ICD-10-CM

## 2021-08-10 ENCOUNTER — Other Ambulatory Visit: Payer: Self-pay | Admitting: Family Medicine

## 2021-08-10 DIAGNOSIS — K219 Gastro-esophageal reflux disease without esophagitis: Secondary | ICD-10-CM

## 2021-08-23 ENCOUNTER — Other Ambulatory Visit: Payer: Self-pay | Admitting: Family Medicine

## 2021-08-23 DIAGNOSIS — R252 Cramp and spasm: Secondary | ICD-10-CM

## 2021-08-30 ENCOUNTER — Other Ambulatory Visit: Payer: Self-pay | Admitting: Family Medicine

## 2021-08-30 DIAGNOSIS — E1121 Type 2 diabetes mellitus with diabetic nephropathy: Secondary | ICD-10-CM

## 2021-08-31 ENCOUNTER — Ambulatory Visit
Admission: EM | Admit: 2021-08-31 | Discharge: 2021-08-31 | Disposition: A | Discharge status: Home / Self Care | Payer: 59 | Attending: Physician Assistant | Admitting: Physician Assistant

## 2021-08-31 ENCOUNTER — Other Ambulatory Visit: Payer: Self-pay

## 2021-08-31 DIAGNOSIS — R238 Other skin changes: Secondary | ICD-10-CM | POA: Insufficient documentation

## 2021-08-31 MED ORDER — VALACYCLOVIR HCL 1 G PO TABS: 1000.0000 mg | ORAL_TABLET | Freq: Three times a day (TID) | ORAL | 0 refills | Status: AC

## 2021-08-31 NOTE — ED Triage Notes (Signed)
Pt c/o shoulder and neck pain x2weeks   rash along his neck, face, and back of head x4days.  Pt states that it hurts and burns.   Pt states that he has not touched any poison oak or Ivy. Pt did work in the yard the week before.   Pt states the only new food was some Delta 9 gummies after not taking them for a week.

## 2021-08-31 NOTE — Discharge Instructions (Addendum)
-  We are testing you for the virus that causes shingles.  Your rash looks to be most consistent with shingles.  I have sent antiviral medicine to the pharmacy for you to start right away.  Your results to be available in a couple of days.  We will call you. - If you do indeed have shingles, you are contagious to anyone who has not had chickenpox.  Try to steer clear of young children, pregnant women, babies, elderly. - You can apply cool compresses to the rash and take ibuprofen and Tylenol for pain discomfort. - If your test comes back negative for the virus that causes shingles, this could be contact dermatitis and we may send a topical corticosteroid for you or possibly oral prednisone. - If your rash significantly worsens, you should return or go to ER.

## 2021-08-31 NOTE — ED Provider Notes (Signed)
[facility]    CSN: [id number] Arrival date & time: 08/31/21  0801      History   Chief Complaint Chief Complaint  Patient presents with   Rash   Neck Pain    HPI Jesse Lawrence. is a 61 y.o. male presenting for painful vesicular rash of the left side of his neck for the past 3 days.  He also reports he has the rash along his left beard line.  He says it does not itch but it is painful.  He reports that he had pain in his left neck, left ear and left shoulder about a week before onset of the rash.  He denies being around any poison ivy but says he did work in his yard the week previous.  He does not have any other areas of rash.  He has not treated the condition in any way.  He denies any new body washes or topical lotions, perfumes.  He has had delta 9 Gummies and that is the only new thing he has tried.  Medical history significant for diabetes, COPD, hypertension, hyperlipidemia, obesity.  Also previous history of MI.  Patient denies ever having chickenpox in the past.  HPI  Past Medical History:  Diagnosis Date   Allergy    Anxiety    COPD (chronic obstructive pulmonary disease) ([place])    Diabetes mellitus without complication ([place])    Hyperlipidemia    Hypertension    Male impotence    MI (myocardial infarction) ([place])    Obesity    PNA (pneumonia)     Patient Active Problem List   Diagnosis Date Noted   Coronary artery disease of native artery of native heart with stable angina pectoris ([place]) 11/26/2018   OSA (obstructive sleep apnea) 05/21/2015   Perennial allergic rhinitis 11/07/2014   Arteriosclerosis of coronary artery 11/07/2014   CD (contact dermatitis) 11/07/2014   Decreased libido 11/07/2014   Diabetes mellitus with renal manifestation ([place]) 11/07/2014   Gastro-esophageal reflux disease without esophagitis 11/07/2014   H/O acute myocardial infarction 11/07/2014   Hypercholesteremia 11/07/2014   Benign hypertension 11/07/2014    Hypertriglyceridemia 11/07/2014   H/O high risk medication treatment 11/07/2014   NASH (nonalcoholic steatohepatitis) 11/07/2014   Microalbuminuria 11/07/2014   Adult BMI 30+ 11/07/2014   Fungal infection of toenail 11/07/2014   Tobacco abuse 11/07/2014   ED (erectile dysfunction) of organic origin 09/29/2006    Past Surgical History:  Procedure Laterality Date   cadiac stenting     CYSTECTOMY     61 years of age   KNEE ARTHROSCOPY         Home Medications    Prior to Admission medications   Medication Sig Start Date End Date Taking? Authorizing Provider  atorvastatin (LIPITOR) 80 MG tablet Take 1 tablet (80 mg total) by mouth daily at 6 PM. 02/22/21  Yes Sowles, Drue Stager, MD  clopidogrel (PLAVIX) 75 MG tablet Take 1 tablet (75 mg total) by mouth daily. 05/29/21  Yes Minna Merritts, MD  ezetimibe (ZETIA) 10 MG tablet Take 1 tablet (10 mg total) by mouth daily. 05/29/21  Yes Minna Merritts, MD  famotidine (PEPCID) 40 MG tablet TAKE 1 TABLET BY MOUTH AT BEDTIME 08/10/21  Yes Sowles, Drue Stager, MD  fluticasone (FLONASE) 50 MCG/ACT nasal spray Place 2 sprays into both nostrils as needed. 02/25/19  Yes Sowles, Drue Stager, MD  Glucagon (GVOKE HYPOPEN 1-PACK) 1 MG/0.2ML SOAJ Inject 1 each into the skin daily as needed. If glucose  very low and unable to eat/drink 06/26/20  Yes Sowles, Drue Stager, MD  Insulin Pen Needle (BD PEN NEEDLE NANO 2ND GEN) 32G X 4 MM MISC Inject 1 each into the skin in the morning and at bedtime. 06/26/20  Yes Sowles, Drue Stager, MD  LANTUS SOLOSTAR 100 UNIT/ML Solostar Pen INJECT 20-50 UNITS INTO SKIN DAILY. 2 UNITS EVERY 3 DAYS TO KEEP FASTING BETWEEN 100-140 (MAX 50/DAY 08/30/21  Yes Bo Merino, FNP  lisinopril (ZESTRIL) 2.5 MG tablet Take 1 tablet (2.5 mg total) by mouth daily. 05/29/21  Yes Minna Merritts, MD  loratadine (CLARITIN) 10 MG tablet Take 1 tablet by mouth daily. 08/09/12  Yes [provider]  meloxicam (MOBIC) 15 MG tablet Take 1 tablet (15 mg  total) by mouth daily as needed for pain. 12/15/20  Yes Cook, Jayce G, DO  metaxalone (SKELAXIN) 800 MG tablet TAKE 1 TABLET BY MOUTH THREE TIMES A DAY 08/23/21  Yes Sowles, Drue Stager, MD  metoprolol succinate (TOPROL-XL) 25 MG 24 hr tablet Take 0.5 tablets (12.5 mg total) by mouth daily. 05/29/21  Yes Minna Merritts, MD  [facility] ULTRA test strip USE AS DIRECTED 08/07/20  Yes Ancil Boozer, Drue Stager, MD  pantoprazole (PROTONIX) 40 MG tablet TAKE 1 TABLET BY MOUTH IN THE MORNING 08/10/21  Yes Sowles, Drue Stager, MD  sildenafil (VIAGRA) 100 MG tablet TAKE 1/2 TO 1 TABLET BY MOUTH DAILY AS NEEDED FOR ERECTILE DYSFUNCTION 07/08/21  Yes Sowles, Drue Stager, MD  triamcinolone cream (KENALOG) 0.1 % Apply 1 application topically 2 (two) times daily. 08/05/20  Yes Burky, Natalie B, NP  TRULICITY 1.5 MO/2.9UT SOPN INJECT 1.5 MG INTO THE SKIN ONCE A WEEK. 08/01/21  Yes Sowles, Drue Stager, MD  umeclidinium-vilanterol (ANORO ELLIPTA) 62.5-25 MCG/ACT AEPB Inhale 1 puff into the lungs daily at 6 (six) AM. 06/18/21  Yes Sowles, Drue Stager, MD  valACYclovir (VALTREX) 1000 MG tablet Take 1 tablet (1,000 mg total) by mouth 3 (three) times daily for 7 days. 08/31/21 09/07/21 Yes Laurene Footman B, PA-C  XIGDUO XR 08-998 MG TB24 TAKE 1 TABLET BY MOUTH IN THE MORNING AND IN THE EVENING 05/06/21  Yes Sowles, Drue Stager, MD  chlorpheniramine-HYDROcodone [facility] PENNKINETIC ER) 10-8 MG/5ML Take 5 mLs by mouth every 12 (twelve) hours as needed for cough. 05/03/21   Bo Merino, FNP  VASCEPA 1 g capsule TAKE 2 CAPSULES BY MOUTH 2 TIMES DAILY. 06/16/21   Steele Sizer, MD    Family History Family History  Problem Relation Age of Onset   Diabetes Mother    CAD Mother    Heart disease Mother    Heart attack Mother    Heart disease Father    Heart attack Father     Social History Social History   Tobacco Use   Smoking status: Former    Packs/day: 1.50    Years: 30.00    Pack years: 45.00    Types: Cigarettes    Quit date: 04/19/2013    Years  since quitting: 8.3    Passive exposure: Current   Smokeless tobacco: Current  Vaping Use   Vaping Use: Former  Substance Use Topics   Alcohol use: No    Alcohol/week: 0.0 standard drinks    Comment: occ   Drug use: No     Allergies   Patient has no known allergies.   Review of Systems Review of Systems  Constitutional:  Negative for fatigue and fever.  HENT:  Negative for facial swelling.   Musculoskeletal:  Positive for arthralgias and neck pain.  Negative for neck stiffness.  Skin:  Positive for color change and rash.  Neurological:  Negative for weakness.    Physical Exam Triage Vital Signs ED Triage Vitals  Enc Vitals Group     BP --      Pulse --      Resp --      Temp --      Temp src --      SpO2 --      Weight 08/31/21 0809 220 lb (99.8 kg)     Height 08/31/21 0809 5' 8"  (1.727 m)     Head Circumference --      Peak Flow --      Pain Score 08/31/21 0808 3     Pain Loc --      Pain Edu? --      Excl. in [place]? --    No data found.  Updated Vital Signs BP 136/86 (BP Location: Left Arm)   Pulse (!) 102   Temp 98.7 F (37.1 C) (Oral)   Resp 18   Ht 5' 8"  (1.727 m)   Wt 220 lb (99.8 kg)   SpO2 95%   BMI 33.45 kg/m     Physical Exam Vitals and nursing note reviewed.  Constitutional:      General: He is not in acute distress.    Appearance: Normal appearance. He is well-developed. He is not ill-appearing.  HENT:     Head: Normocephalic and atraumatic.     Right Ear: Tympanic membrane, ear canal and external ear normal.     Left Ear: Tympanic membrane, ear canal and external ear normal.     Nose: Nose normal.     Mouth/Throat:     Mouth: Mucous membranes are moist.     Pharynx: Oropharynx is clear.  Eyes:     General: No scleral icterus.    Conjunctiva/sclera: Conjunctivae normal.  Cardiovascular:     Rate and Rhythm: Normal rate and regular rhythm.  Pulmonary:     Effort: Pulmonary effort is normal. No respiratory distress.     Breath  sounds: Normal breath sounds.  Musculoskeletal:     Cervical back: Neck supple.  Skin:    General: Skin is warm and dry.     Capillary Refill: Capillary refill takes less than 2 seconds.     Findings: Rash present.     Comments: Multiple patches of vesicles along the left anterior and posterior neck and along the left mandible as well as behind the left ear.  Neurological:     General: No focal deficit present.     Mental Status: He is alert. Mental status is at baseline.     Motor: No weakness.     Gait: Gait normal.  Psychiatric:        Mood and Affect: Mood normal.        Behavior: Behavior normal.        Thought Content: Thought content normal.        UC Treatments / Results  Labs (all labs ordered are listed, but only abnormal results are displayed) Labs Reviewed  VARICELLA-ZOSTER BY PCR    EKG   Radiology No results found.  Procedures Procedures (including critical care time)  Medications Ordered in UC Medications - No data to display  Initial Impression / Assessment and Plan / UC Course  I have reviewed the triage vital signs and the nursing notes.  Pertinent labs & imaging results that were available during my care of  the patient were reviewed by me and considered in my medical decision making (see chart for details).  61 year old male presenting for painful vesicular rash of the left side of his neck and chin for the past 3 days.  About a week before that he was experiencing a left-sided earache and left neck pain which has continued.  Images obtained are included in chart.  The rash does not cross over the midline and it is tender to palpation.  Appears to be consistent with shingles.  I did obtain vesicular fluid to test for herpes zoster since he says he is never had chickenpox.  We will go forward with treatment for suspected shingles.  Sent Valtrex to pharmacy.  Advised if the testing is negative and he is not improving, we can send topical or oral  corticosteroids.  ED for any acute worsening of symptoms.   Final Clinical Impressions(s) / UC Diagnoses   Final diagnoses:  Vesicular rash     Discharge Instructions      -We are testing you for the virus that causes shingles.  Your rash looks to be most consistent with shingles.  I have sent antiviral medicine to the pharmacy for you to start right away.  Your results to be available in a couple of days.  We will call you. - If you do indeed have shingles, you are contagious to anyone who has not had chickenpox.  Try to steer clear of young children, pregnant women, babies, elderly. - You can apply cool compresses to the rash and take ibuprofen and Tylenol for pain discomfort. - If your test comes back negative for the virus that causes shingles, this could be contact dermatitis and we may send a topical corticosteroid for you or possibly oral prednisone. - If your rash significantly worsens, you should return or go to ER.     ED Prescriptions     Medication Sig Dispense Auth. Provider   valACYclovir (VALTREX) 1000 MG tablet Take 1 tablet (1,000 mg total) by mouth 3 (three) times daily for 7 days. 21 tablet Danton Clap, PA-C      PDMP not reviewed this encounter.   Danton Clap, PA-C 08/31/21 [phone]

## 2021-09-04 LAB — VARICELLA-ZOSTER BY PCR: Varicella-Zoster, PCR: POSITIVE — AB

## 2021-09-17 ENCOUNTER — Ambulatory Visit: Payer: 59 | Admitting: Family Medicine

## 2021-09-18 ENCOUNTER — Other Ambulatory Visit: Payer: Self-pay | Admitting: Family Medicine

## 2021-09-18 DIAGNOSIS — R252 Cramp and spasm: Secondary | ICD-10-CM

## 2021-09-19 NOTE — Progress Notes (Signed)
Name: Jesse Lawrence.   MRN: [id number]    DOB: [birth year]   Date:09/20/2021       Progress Note  Subjective  Chief Complaint  Follow Up  HPI  DM II :  His A1C was at goal for a long time, however had a gap of insurance and medication, A1C was up to 10.7 % last visit was  down to 7.4 %, currently on Trulicity 1.5, Xigduo and Lantus 40 units daily. He has associated  ED, HTN and dyslipidemia.   His A1C is 7.6 %   HTN/CAD:no palpitation, denies chest pain he has a history of angina but controlled with medication, on low dose ACE and half pill metoprolol , he has been taking Atorvastatin and Zetia , he is still out of Vascepa but he asked me to send it to [facility] to see if he can use the coupon there. Marland KitchenHe sees cardiologist - Dr. Rockey Situ    ED: he has difficulty maintaining and erection has been doing well on Viagra, but has pain with erection that radiates to his right testicle.    OSA: he has not been compliant with CPAP machine, he cannot tolerated it, he is aware of the importance of wearing it .Unchanged    GERD: he takes pantoprazole in am and famotidine at night and seems to control symptoms.   Obesity:weight is stable, he was not compliant with his diet when he had shingles, but he lost 1 lbs since last visit   Hematospermia: seen by Urologist and was given reassurance   Shingles: he developed rash on his left side neck and upper chest.  He went to Urgent Care and was given Valtrex to take TID . The rash has almost completely resolved but he continues to have pain , like sand paper. He never had Shingrix vaccine   Acute low back pain: he noticed left lower back pain over the past week, no trauma, no rashes on lumbar spine, no bowel or bladder incontinence   Patient Active Problem List   Diagnosis Date Noted   Coronary artery disease of native artery of native heart with stable angina pectoris ([place]) 11/26/2018   OSA (obstructive sleep apnea) 05/21/2015   Perennial allergic  rhinitis 11/07/2014   Arteriosclerosis of coronary artery 11/07/2014   CD (contact dermatitis) 11/07/2014   Decreased libido 11/07/2014   Diabetes mellitus with renal manifestation ([place]) 11/07/2014   Gastro-esophageal reflux disease without esophagitis 11/07/2014   H/O acute myocardial infarction 11/07/2014   Hypercholesteremia 11/07/2014   Benign hypertension 11/07/2014   Hypertriglyceridemia 11/07/2014   H/O high risk medication treatment 11/07/2014   NASH (nonalcoholic steatohepatitis) 11/07/2014   Microalbuminuria 11/07/2014   Adult BMI 30+ 11/07/2014   Fungal infection of toenail 11/07/2014   Tobacco abuse 11/07/2014   ED (erectile dysfunction) of organic origin 09/29/2006    Past Surgical History:  Procedure Laterality Date   cadiac stenting     CYSTECTOMY     61 years of age   KNEE ARTHROSCOPY      Family History  Problem Relation Age of Onset   Diabetes Mother    CAD Mother    Heart disease Mother    Heart attack Mother    Heart disease Father    Heart attack Father     Social History   Tobacco Use   Smoking status: Former    Packs/day: 1.50    Years: 30.00    Total pack years: 45.00    Types: Cigarettes  Quit date: 04/19/2013    Years since quitting: 8.4    Passive exposure: Current   Smokeless tobacco: Current  Substance Use Topics   Alcohol use: No    Alcohol/week: 0.0 standard drinks of alcohol    Comment: occ     Current Outpatient Medications:    amitriptyline (ELAVIL) 25 MG tablet, Take 1 tablet (25 mg total) by mouth at bedtime., Disp: 90 tablet, Rfl: 0   clopidogrel (PLAVIX) 75 MG tablet, Take 1 tablet (75 mg total) by mouth daily., Disp: 90 tablet, Rfl: 3   ezetimibe (ZETIA) 10 MG tablet, Take 1 tablet (10 mg total) by mouth daily., Disp: 90 tablet, Rfl: 3   fluticasone (FLONASE) 50 MCG/ACT nasal spray, Place 2 sprays into both nostrils as needed., Disp: 48 g, Rfl: 1   Glucagon (GVOKE HYPOPEN 1-PACK) 1 MG/0.2ML SOAJ, Inject 1 each into  the skin daily as needed. If glucose very low and unable to eat/drink, Disp: 0.2 mL, Rfl: 1   Insulin Pen Needle (BD PEN NEEDLE NANO 2ND GEN) 32G X 4 MM MISC, Inject 1 each into the skin in the morning and at bedtime., Disp: 200 each, Rfl: 2   LANTUS SOLOSTAR 100 UNIT/ML Solostar Pen, INJECT 20-50 UNITS INTO SKIN DAILY. 2 UNITS EVERY 3 DAYS TO KEEP FASTING BETWEEN 100-140 (MAX 50/DAY, Disp: 15 mL, Rfl: 5   lidocaine (LIDODERM) 5 %, Place 2 patches onto the skin daily. Remove & Discard patch within 12 hours or as directed by MD, Disp: 60 patch, Rfl: 2   lisinopril (ZESTRIL) 2.5 MG tablet, Take 1 tablet (2.5 mg total) by mouth daily., Disp: 90 tablet, Rfl: 3   loratadine (CLARITIN) 10 MG tablet, Take 1 tablet by mouth daily., Disp: , Rfl:    metoprolol succinate (TOPROL-XL) 25 MG 24 hr tablet, Take 0.5 tablets (12.5 mg total) by mouth daily., Disp: 45 tablet, Rfl: 3   ONETOUCH ULTRA test strip, USE AS DIRECTED, Disp: 100 strip, Rfl: 11   sildenafil (VIAGRA) 100 MG tablet, TAKE 1/2 TO 1 TABLET BY MOUTH DAILY AS NEEDED FOR ERECTILE DYSFUNCTION, Disp: 30 tablet, Rfl: 0   atorvastatin (LIPITOR) 80 MG tablet, Take 1 tablet (80 mg total) by mouth daily at 6 PM., Disp: 90 tablet, Rfl: 1   Dulaglutide (TRULICITY) 1.5 NT/6.1WE SOPN, Inject 1.5 mg into the skin once a week., Disp: 6 mL, Rfl: 1   famotidine (PEPCID) 40 MG tablet, Take 1 tablet (40 mg total) by mouth at bedtime., Disp: 90 tablet, Rfl: 1   icosapent Ethyl (VASCEPA) 1 g capsule, TAKE 2 CAPSULES BY MOUTH 2 TIMES DAILY., Disp: 360 capsule, Rfl: 1   metaxalone (SKELAXIN) 800 MG tablet, Take 1 tablet (800 mg total) by mouth 3 (three) times daily., Disp: 90 tablet, Rfl: 1   pantoprazole (PROTONIX) 40 MG tablet, Take 1 tablet (40 mg total) by mouth every morning., Disp: 90 tablet, Rfl: 1   XIGDUO XR 08-998 MG TB24, TAKE 1 TABLET BY MOUTH IN THE MORNING AND IN THE EVENING, Disp: 180 tablet, Rfl: 1  No Known Allergies  I personally reviewed active  problem list, medication list, allergies, family history, social history, health maintenance with the patient/caregiver today.   ROS  Constitutional: Negative for fever or weight change.  Respiratory: Negative for cough and shortness of breath.   Cardiovascular: Negative for chest pain or palpitations.  Gastrointestinal: Negative for abdominal pain, no bowel changes.  Musculoskeletal: Negative for gait problem or joint swelling.  Skin:positive  for rash.  Neurological: Negative for dizziness or headache.  No other specific complaints in a complete review of systems (except as listed in HPI above).   Objective  Vitals:   09/20/21 1515  BP: 112/74  Pulse: 89  Resp: 16  SpO2: 96%  Weight: 222 lb (100.7 kg)  Height: 5' 8"  (1.727 m)    Body mass index is 33.75 kg/m.  Physical Exam  Constitutional: Patient appears well-developed and well-nourished. Obese  No distress.  HEENT: head atraumatic, normocephalic, pupils equal and reactive to light, neck supple Cardiovascular: Normal rate, regular rhythm and normal heart sounds.  No murmur heard. No BLE edema. Pulmonary/Chest: Effort normal and breath sounds normal. No respiratory distress. Abdominal: Soft.  There is no tenderness. Skin: small rash on left side of neck  Muscular skeletal: pain during palpation of left lower back, negative straight leg raise, no groin pain  Psychiatric: Patient has a normal mood and affect. behavior is normal. Judgment and thought content normal.   Recent Results (from the past 2160 hour(s))  Varicella-zoster by PCR     Status: Abnormal   Collection Time: 08/31/21  8:44 AM   Specimen: Neck, Left; Sterile Swab  Result Value Ref Range   Varicella-Zoster, PCR Positive (A) Negative    Comment: (NOTE) Varicella Zoster Virus DNA detected. This test was developed and its performance characteristics determined by [facility].  It has not been cleared or approved by the Food and Drug Administration.  The FDA  has determined that such clearance or approval is not necessary. Performed At: [facility] [place], [place] [id number] Rush Farmer MD [id number]   POCT HgB A1C     Status: Abnormal   Collection Time: 09/20/21  3:24 PM  Result Value Ref Range   Hemoglobin A1C 7.6 (A) 4.0 - 5.6 %   HbA1c POC (<> result, manual entry)     HbA1c, POC (prediabetic range)     HbA1c, POC (controlled diabetic range)      PHQ2/9:    09/20/2021    3:13 PM 06/06/2021    9:42 AM 05/03/2021   10:41 AM 03/26/2021    3:16 PM 02/22/2021    9:18 AM  Depression screen PHQ 2/9  Decreased Interest 0 0 0 0 0  Down, Depressed, Hopeless 0 0 0 0 0  PHQ - 2 Score 0 0 0 0 0  Altered sleeping 0 0  0 0  Tired, decreased energy 0 0  0 0  Change in appetite 0 0  0 0  Feeling bad or failure about yourself  0 0  0 0  Trouble concentrating 0 0  0 0  Moving slowly or fidgety/restless 0 0  0 0  Suicidal thoughts 0 0  0 0  PHQ-9 Score 0 0  0 0    phq 9 is negative   Fall Risk:    09/20/2021    3:13 PM 06/06/2021    9:41 AM 05/03/2021   10:40 AM 03/26/2021    3:15 PM 02/22/2021    9:18 AM  Fall Risk   Falls in the past year? 0 0 0 0 0  Number falls in past yr: 0 0 0 0   Injury with Fall? 0 0 0 0   Risk for fall due to : No Fall Risks No Fall Risks  No Fall Risks   Follow up Falls prevention discussed Falls prevention discussed Falls evaluation completed Falls prevention discussed Falls prevention discussed      Functional Status  Survey: Is the patient deaf or have difficulty hearing?: No Does the patient have difficulty seeing, even when wearing glasses/contacts?: No Does the patient have difficulty concentrating, remembering, or making decisions?: No Does the patient have difficulty walking or climbing stairs?: No Does the patient have difficulty dressing or bathing?: No Does the patient have difficulty doing errands alone such as visiting a doctor's office or shopping?:  No    Assessment & Plan  1. Type 2 diabetes mellitus with diabetic nephropathy, without long-term current use of insulin (HCC)  - POCT HgB A1C - XIGDUO XR 08-998 MG TB24; TAKE 1 TABLET BY MOUTH IN THE MORNING AND IN THE EVENING  Dispense: 180 tablet; Refill: 1 - Dulaglutide (TRULICITY) 1.5 PT/4.6FK SOPN; Inject 1.5 mg into the skin once a week.  Dispense: 6 mL; Refill: 1  2. Post herpetic neuralgia  - lidocaine (LIDODERM) 5 %; Place 2 patches onto the skin daily. Remove & Discard patch within 12 hours or as directed by MD  Dispense: 60 patch; Refill: 2 - amitriptyline (ELAVIL) 25 MG tablet; Take 1 tablet (25 mg total) by mouth at bedtime.  Dispense: 90 tablet; Refill: 0  3. Dyslipidemia associated with type 2 diabetes mellitus (HCC)  - atorvastatin (LIPITOR) 80 MG tablet; Take 1 tablet (80 mg total) by mouth daily at 6 PM.  Dispense: 90 tablet; Refill: 1 - Dulaglutide (TRULICITY) 1.5 CL/2.7NT SOPN; Inject 1.5 mg into the skin once a week.  Dispense: 6 mL; Refill: 1 - icosapent Ethyl (VASCEPA) 1 g capsule; TAKE 2 CAPSULES BY MOUTH 2 TIMES DAILY.  Dispense: 360 capsule; Refill: 1  4. Hypercholesteremia  - atorvastatin (LIPITOR) 80 MG tablet; Take 1 tablet (80 mg total) by mouth daily at 6 PM.  Dispense: 90 tablet; Refill: 1  5. Gastro-esophageal reflux disease without esophagitis  - famotidine (PEPCID) 40 MG tablet; Take 1 tablet (40 mg total) by mouth at bedtime.  Dispense: 90 tablet; Refill: 1 - pantoprazole (PROTONIX) 40 MG tablet; Take 1 tablet (40 mg total) by mouth every morning.  Dispense: 90 tablet; Refill: 1  6. Muscle cramp  - metaxalone (SKELAXIN) 800 MG tablet; Take 1 tablet (800 mg total) by mouth 3 (three) times daily.  Dispense: 90 tablet; Refill: 1  7. Acute left-sided low back pain without sciatica  - meloxicam (MOBIC) 15 MG tablet; Take 1 tablet (15 mg total) by mouth daily.  Dispense: 30 tablet; Refill: 0

## 2021-09-20 ENCOUNTER — Encounter: Payer: Self-pay | Admitting: Family Medicine

## 2021-09-20 ENCOUNTER — Ambulatory Visit: Payer: 59 | Admitting: Family Medicine

## 2021-09-20 VITALS — BP 112/74 | HR 89 | Resp 16 | Ht 68.0 in | Wt 222.0 lb

## 2021-09-20 DIAGNOSIS — K219 Gastro-esophageal reflux disease without esophagitis: Secondary | ICD-10-CM

## 2021-09-20 DIAGNOSIS — E1169 Type 2 diabetes mellitus with other specified complication: Secondary | ICD-10-CM | POA: Diagnosis not present

## 2021-09-20 DIAGNOSIS — E7800 Pure hypercholesterolemia, unspecified: Secondary | ICD-10-CM | POA: Diagnosis not present

## 2021-09-20 DIAGNOSIS — B0229 Other postherpetic nervous system involvement: Secondary | ICD-10-CM

## 2021-09-20 DIAGNOSIS — R252 Cramp and spasm: Secondary | ICD-10-CM

## 2021-09-20 DIAGNOSIS — E785 Hyperlipidemia, unspecified: Secondary | ICD-10-CM

## 2021-09-20 DIAGNOSIS — E1121 Type 2 diabetes mellitus with diabetic nephropathy: Secondary | ICD-10-CM | POA: Diagnosis not present

## 2021-09-20 DIAGNOSIS — M545 Low back pain, unspecified: Secondary | ICD-10-CM

## 2021-09-20 LAB — POCT GLYCOSYLATED HEMOGLOBIN (HGB A1C): Hemoglobin A1C: 7.6 % — AB (ref 4.0–5.6)

## 2021-09-20 MED ORDER — TRULICITY 1.5 MG/0.5ML ~~LOC~~ SOAJ: 1.5000 mg | SUBCUTANEOUS | 1 refills | Status: DC

## 2021-09-20 MED ORDER — MELOXICAM 15 MG PO TABS: 15.0000 mg | ORAL_TABLET | Freq: Every day | ORAL | 0 refills | Status: DC

## 2021-09-20 MED ORDER — XIGDUO XR 5-1000 MG PO TB24: ORAL_TABLET | ORAL | 1 refills | Status: DC

## 2021-09-20 MED ORDER — METAXALONE 800 MG PO TABS: 800.0000 mg | ORAL_TABLET | Freq: Three times a day (TID) | ORAL | 1 refills | Status: DC

## 2021-09-20 MED ORDER — AMITRIPTYLINE HCL 25 MG PO TABS: 25.0000 mg | ORAL_TABLET | Freq: Every day | ORAL | 0 refills | Status: DC

## 2021-09-20 MED ORDER — ICOSAPENT ETHYL 1 G PO CAPS: ORAL_CAPSULE | ORAL | 1 refills | Status: DC

## 2021-09-20 MED ORDER — PANTOPRAZOLE SODIUM 40 MG PO TBEC: 40.0000 mg | DELAYED_RELEASE_TABLET | Freq: Every morning | ORAL | 1 refills | Status: DC

## 2021-09-20 MED ORDER — ATORVASTATIN CALCIUM 80 MG PO TABS: 80.0000 mg | ORAL_TABLET | Freq: Every day | ORAL | 1 refills | Status: DC

## 2021-09-20 MED ORDER — LIDOCAINE 5 % EX PTCH: 2.0000 | MEDICATED_PATCH | CUTANEOUS | 2 refills | Status: DC

## 2021-09-20 MED ORDER — FAMOTIDINE 40 MG PO TABS: 40.0000 mg | ORAL_TABLET | Freq: Every day | ORAL | 1 refills | Status: DC

## 2021-10-12 ENCOUNTER — Other Ambulatory Visit: Payer: Self-pay | Admitting: Family Medicine

## 2021-10-12 DIAGNOSIS — N529 Male erectile dysfunction, unspecified: Secondary | ICD-10-CM

## 2021-10-30 ENCOUNTER — Other Ambulatory Visit: Payer: Self-pay | Admitting: Family Medicine

## 2021-10-30 DIAGNOSIS — E1121 Type 2 diabetes mellitus with diabetic nephropathy: Secondary | ICD-10-CM

## 2021-12-26 NOTE — Progress Notes (Signed)
Name: Jesse Lawrence.   MRN: [id number]    DOB: Jul 23, 1960   Date:12/27/2021       Progress Note  Subjective  Chief Complaint  Follow Up  HPI  DM II :  His A1C was at goal for a long time, however had a gap of insurance and medication, A1C was up to 10.7 % last visit it went down to 7.4 % but up again to 7.6 % he is currently on Trulicity 1.5, Xigduo and Lantus 50 units dailyand today his A1C is 7.7 % , we will increase dose of Trulicity to 3 mg weekly, also advised to take both Xigduo tablets in the morning. 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 Rockey Situ    ED: he has difficulty maintaining and erection has been doing well on Viagra, but has pain with erection that radiates to his right testicle.    OSA: he has not been compliant with CPAP machine, he cannot tolerated it, he is aware of the importance of wearing it  Unchanged    GERD: he takes pantoprazole in am but ran out of pepcid and symptoms increase , he will go back to the pharmacy to get it refilled.   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 He states symptoms started when he started to drive more often for his work. Discussed Lyrica, we will  avoid steroids due to DM. Explained it may cause sedation and to start at night and titrate it slowly over the weekend, don't take during the day if he gets sleepy on medication   Left hand pain: palm of left hand, has a knot below left middle finger, he has pain during touch, denies trigger finger symptoms, going on for months   Patient Active Problem List   Diagnosis Date Noted   Coronary artery disease of native artery of native heart with stable angina pectoris ([place]) 11/26/2018   OSA (obstructive sleep apnea) 05/21/2015   Perennial allergic rhinitis 11/07/2014   Arteriosclerosis of coronary artery 11/07/2014   CD (contact dermatitis) 11/07/2014   Decreased libido 11/07/2014   Diabetes mellitus with renal manifestation ([place]) 11/07/2014   Gastro-esophageal reflux disease without esophagitis 11/07/2014   H/O acute myocardial infarction 11/07/2014   Hypercholesteremia 11/07/2014   Benign hypertension 11/07/2014   Hypertriglyceridemia 11/07/2014   H/O high risk medication treatment 11/07/2014   NASH (nonalcoholic steatohepatitis) 11/07/2014   Microalbuminuria 11/07/2014   Adult BMI 30+ 11/07/2014   Fungal infection of toenail 11/07/2014   Tobacco abuse 11/07/2014   ED (erectile dysfunction) of organic origin 09/29/2006    Past Surgical History:  Procedure  Laterality Date   cadiac stenting     CYSTECTOMY     61 years of age   KNEE ARTHROSCOPY      Family History  Problem Relation Age of Onset   Diabetes Mother    CAD Mother    Heart disease Mother    Heart attack Mother    Heart disease Father    Heart attack Father     Social History   Tobacco Use   Smoking status: Former    Packs/day: 1.50    Years: 30.00    Total pack years: 45.00    Types: Cigarettes    Quit date: 04/19/2013    Years since quitting: 8.6    Passive exposure: Current   Smokeless tobacco: Current  Substance Use Topics   Alcohol use: No    Alcohol/week: 0.0 standard drinks of alcohol     Comment: occ     Current Outpatient Medications:    atorvastatin (LIPITOR) 80 MG tablet, Take 1 tablet (80 mg total) by mouth daily at 6 PM., Disp: 90 tablet, Rfl: 1   BD PEN NEEDLE NANO 2ND GEN 32G X 4 MM MISC, INJECT 1 EACH INTO THE SKIN IN THE MORNING AND AT BEDTIME., Disp: 200 each, Rfl: 2   clopidogrel (PLAVIX) 75 MG tablet, Take 1 tablet (75 mg total) by mouth daily., Disp: 90 tablet, Rfl: 3   Dulaglutide (TRULICITY) 3 TA/5.6PV SOPN, Inject 3 mg as directed once a week., Disp: 6 mL, Rfl: 0   ezetimibe (ZETIA) 10 MG tablet, Take 1 tablet (10 mg total) by mouth daily., Disp: 90 tablet, Rfl: 3   famotidine (PEPCID) 40 MG tablet, Take 1 tablet (40 mg total) by mouth at bedtime., Disp: 90 tablet, Rfl: 1   fluticasone (FLONASE) 50 MCG/ACT nasal spray, Place 2 sprays into both nostrils as needed., Disp: 48 g, Rfl: 1   Glucagon (GVOKE HYPOPEN 1-PACK) 1 MG/0.2ML SOAJ, Inject 1 each into the skin daily as needed. If glucose very low and unable to eat/drink, Disp: 0.2 mL, Rfl: 1   icosapent Ethyl (VASCEPA) 1 g capsule, TAKE 2 CAPSULES BY MOUTH 2 TIMES DAILY., Disp: 360 capsule, Rfl: 1   LANTUS SOLOSTAR 100 UNIT/ML Solostar Pen, INJECT 20-50 UNITS INTO SKIN DAILY. 2 UNITS EVERY 3 DAYS TO KEEP FASTING BETWEEN 100-140 (MAX 50/DAY, Disp: 15 mL, Rfl: 5   lidocaine (LIDODERM) 5 %, Place 2 patches onto the skin daily. Remove & Discard patch within 12 hours or as directed by MD, Disp: 60 patch, Rfl: 2   lisinopril (ZESTRIL) 2.5 MG tablet, Take 1 tablet (2.5 mg total) by mouth daily., Disp: 90 tablet, Rfl: 3   loratadine (CLARITIN) 10 MG tablet, Take 1 tablet by mouth daily., Disp: , Rfl:    meloxicam (MOBIC) 15 MG tablet, Take 1 tablet (15 mg total) by mouth daily., Disp: 30 tablet, Rfl: 0   metaxalone (SKELAXIN) 800 MG tablet, Take 1 tablet (800 mg total) by mouth 3 (three) times daily., Disp: 90 tablet, Rfl: 1   metoprolol succinate (TOPROL-XL) 25 MG 24 hr tablet, Take 0.5 tablets (12.5 mg total) by mouth  daily., Disp: 45 tablet, Rfl: 3   ONETOUCH ULTRA test strip, USE AS DIRECTED, Disp: 100 strip, Rfl: 11   pantoprazole (PROTONIX) 40 MG tablet, Take 1 tablet (40 mg total) by mouth every morning., Disp: 90 tablet, Rfl: 1   pregabalin (LYRICA) 50 MG capsule, Take 1-3 capsules (50-150 mg total) by mouth 3 (three) times daily., Disp: 90 capsule,  Rfl: 0   XIGDUO XR 08-998 MG TB24, TAKE 1 TABLET BY MOUTH IN THE MORNING AND IN THE EVENING, Disp: 180 tablet, Rfl: 1   sildenafil (VIAGRA) 100 MG tablet, TAKE 1/2 TO 1 TABLET BY MOUTH DAILY AS NEEDED FOR ERECTILE DYSFUNCTION, Disp: 30 tablet, Rfl: 0  No Known Allergies  I personally reviewed active problem list, medication list, allergies, family history, social history, health maintenance with the patient/caregiver today.   ROS  Ten systems reviewed and is negative except as mentioned in HPI   Objective  Vitals:   12/27/21 1540  BP: 116/72  Pulse: 94  Resp: 16  SpO2: 95%  Weight: 220 lb (99.8 kg)  Height: 5' 8"  (1.727 m)    Body mass index is 33.45 kg/m.  Physical Exam  Constitutional: Patient appears well-developed and well-nourished. Obese  No distress.  HEENT: head atraumatic, normocephalic, pupils equal and reactive to light,  neck supple Cardiovascular: Normal rate, regular rhythm and normal heart sounds.  No murmur heard. No BLE edema. Pulmonary/Chest: Effort normal and breath sounds normal. No respiratory distress. Abdominal: Soft.  There is no tenderness. Muscular skeletal: pain during palpation of lumbar spine, normal hip exam, mild antalgic gait, negative straight leg raise  Psychiatric: Patient has a normal mood and affect. behavior is normal. Judgment and thought content normal.   Recent Results (from the past 2160 hour(s))  POCT HgB A1C     Status: Abnormal   Collection Time: 12/27/21  3:45 PM  Result Value Ref Range   Hemoglobin A1C 7.7 (A) 4.0 - 5.6 %   HbA1c POC (<> result, manual entry)     HbA1c, POC (prediabetic  range)     HbA1c, POC (controlled diabetic range)       PHQ2/9:    12/27/2021    3:38 PM 09/20/2021    3:13 PM 06/06/2021    9:42 AM 05/03/2021   10:41 AM 03/26/2021    3:16 PM  Depression screen PHQ 2/9  Decreased Interest 0 0 0 0 0  Down, Depressed, Hopeless 0 0 0 0 0  PHQ - 2 Score 0 0 0 0 0  Altered sleeping 0 0 0  0  Tired, decreased energy 0 0 0  0  Change in appetite 0 0 0  0  Feeling bad or failure about yourself  0 0 0  0  Trouble concentrating 0 0 0  0  Moving slowly or fidgety/restless 0 0 0  0  Suicidal thoughts 0 0 0  0  PHQ-9 Score 0 0 0  0    phq 9 is negative   Fall Risk:    12/27/2021    3:38 PM 09/20/2021    3:13 PM 06/06/2021    9:41 AM 05/03/2021   10:40 AM 03/26/2021    3:15 PM  Fall Risk   Falls in the past year? 1 0 0 0 0  Number falls in past yr: 0 0 0 0 0  Injury with Fall? 0 0 0 0 0  Risk for fall due to : No Fall Risks No Fall Risks No Fall Risks  No Fall Risks  Follow up Falls prevention discussed Falls prevention discussed Falls prevention discussed Falls evaluation completed Falls prevention discussed      Functional Status Survey: Is the patient deaf or have difficulty hearing?: No Does the patient have difficulty seeing, even when wearing glasses/contacts?: No Does the patient have difficulty concentrating, remembering, or making decisions?: No Does the patient have difficulty  walking or climbing stairs?: Yes Does the patient have difficulty dressing or bathing?: No Does the patient have difficulty doing errands alone such as visiting a doctor's office or shopping?: No    Assessment & Plan  1. Type 2 diabetes mellitus with diabetic nephropathy, with long-term current use of insulin (HCC)  - POCT HgB A1C - HM Diabetes Foot Exam - Dulaglutide (TRULICITY) 3 LL/9.7IX SOPN; Inject 3 mg as directed once a week.  Dispense: 6 mL; Refill: 0 - COMPLETE METABOLIC PANEL WITH GFR - Urine Microalbumin w/creat. ratio  2. Need for immunization  against influenza  - Flu Vaccine QUAD 6+ mos PF IM (Fluarix Quad PF)  3. Radiculitis, lumbosacral  - pregabalin (LYRICA) 50 MG capsule; Take 1-3 capsules (50-150 mg total) by mouth 3 (three) times daily.  Dispense: 90 capsule; Refill: 0 - DG Lumbar Spine Complete; Future  4. NASH (nonalcoholic steatohepatitis)   5. Gastro-esophageal reflux disease without esophagitis  Having more symptoms since out of H2 blocker   6. Dyslipidemia associated with type 2 diabetes mellitus (HCC) = - Lipid panel  7. Coronary artery disease of native artery of native heart with stable angina pectoris [facility])  On medical management   8. OSA (obstructive sleep apnea)  Refused CPAP  9. Arteriosclerosis of coronary artery   10. Benign hypertension  At goal   11. Migraine without aura and without status migrainosus, not intractable   12. Left hand pain  Advised referral to Ortho but he is not interested   13. ED (erectile dysfunction) of organic origin  - sildenafil (VIAGRA) 100 MG tablet; TAKE 1/2 TO 1 TABLET BY MOUTH DAILY AS NEEDED FOR ERECTILE DYSFUNCTION  Dispense: 30 tablet; Refill: 0 .

## 2021-12-27 ENCOUNTER — Encounter: Payer: Self-pay | Admitting: Family Medicine

## 2021-12-27 ENCOUNTER — Ambulatory Visit
Admission: RE | Admit: 2021-12-27 | Discharge: 2021-12-27 | Disposition: A | Discharge status: Home / Self Care | Payer: 59 | Attending: Family Medicine | Admitting: Family Medicine

## 2021-12-27 ENCOUNTER — Ambulatory Visit
Admission: RE | Admit: 2021-12-27 | Discharge: 2021-12-27 | Disposition: A | Discharge status: Home / Self Care | Payer: 59 | Source: Ambulatory Visit | Attending: Family Medicine | Admitting: Family Medicine

## 2021-12-27 ENCOUNTER — Ambulatory Visit: Payer: 59 | Admitting: Family Medicine

## 2021-12-27 VITALS — BP 116/72 | HR 94 | Resp 16 | Ht 68.0 in | Wt 220.0 lb

## 2021-12-27 DIAGNOSIS — Z794 Long term (current) use of insulin: Secondary | ICD-10-CM | POA: Diagnosis not present

## 2021-12-27 DIAGNOSIS — E1121 Type 2 diabetes mellitus with diabetic nephropathy: Secondary | ICD-10-CM

## 2021-12-27 DIAGNOSIS — K7581 Nonalcoholic steatohepatitis (NASH): Secondary | ICD-10-CM

## 2021-12-27 DIAGNOSIS — G43009 Migraine without aura, not intractable, without status migrainosus: Secondary | ICD-10-CM

## 2021-12-27 DIAGNOSIS — M5417 Radiculopathy, lumbosacral region: Secondary | ICD-10-CM | POA: Diagnosis not present

## 2021-12-27 DIAGNOSIS — G4733 Obstructive sleep apnea (adult) (pediatric): Secondary | ICD-10-CM

## 2021-12-27 DIAGNOSIS — K219 Gastro-esophageal reflux disease without esophagitis: Secondary | ICD-10-CM

## 2021-12-27 DIAGNOSIS — M4727 Other spondylosis with radiculopathy, lumbosacral region: Secondary | ICD-10-CM | POA: Diagnosis not present

## 2021-12-27 DIAGNOSIS — I10 Essential (primary) hypertension: Secondary | ICD-10-CM

## 2021-12-27 DIAGNOSIS — I2510 Atherosclerotic heart disease of native coronary artery without angina pectoris: Secondary | ICD-10-CM

## 2021-12-27 DIAGNOSIS — Z23 Encounter for immunization: Secondary | ICD-10-CM

## 2021-12-27 DIAGNOSIS — N529 Male erectile dysfunction, unspecified: Secondary | ICD-10-CM

## 2021-12-27 DIAGNOSIS — M79642 Pain in left hand: Secondary | ICD-10-CM

## 2021-12-27 DIAGNOSIS — M4726 Other spondylosis with radiculopathy, lumbar region: Secondary | ICD-10-CM | POA: Insufficient documentation

## 2021-12-27 DIAGNOSIS — I25118 Atherosclerotic heart disease of native coronary artery with other forms of angina pectoris: Secondary | ICD-10-CM

## 2021-12-27 DIAGNOSIS — E785 Hyperlipidemia, unspecified: Secondary | ICD-10-CM

## 2021-12-27 DIAGNOSIS — E1169 Type 2 diabetes mellitus with other specified complication: Secondary | ICD-10-CM

## 2021-12-27 LAB — POCT GLYCOSYLATED HEMOGLOBIN (HGB A1C): Hemoglobin A1C: 7.7 % — AB (ref 4.0–5.6)

## 2021-12-27 MED ORDER — TRULICITY 3 MG/0.5ML ~~LOC~~ SOAJ: 3.0000 mg | SUBCUTANEOUS | 0 refills | Status: DC

## 2021-12-27 MED ORDER — PREGABALIN 50 MG PO CAPS: 50.0000 mg | ORAL_CAPSULE | Freq: Three times a day (TID) | ORAL | 0 refills | Status: DC

## 2021-12-27 MED ORDER — SILDENAFIL CITRATE 100 MG PO TABS: ORAL_TABLET | ORAL | 0 refills | Status: DC

## 2021-12-28 ENCOUNTER — Other Ambulatory Visit: Payer: Self-pay | Admitting: Family Medicine

## 2021-12-28 DIAGNOSIS — B0229 Other postherpetic nervous system involvement: Secondary | ICD-10-CM

## 2021-12-28 LAB — COMPLETE METABOLIC PANEL WITH GFR
AG Ratio: 1.7 (calc) (ref 1.0–2.5)
ALT: 25 U/L (ref 9–46)
AST: 16 U/L (ref 10–35)
Albumin: 4.5 g/dL (ref 3.6–5.1)
Alkaline phosphatase (APISO): 60 U/L (ref 35–144)
BUN: 14 mg/dL (ref 7–25)
CO2: 25 mmol/L (ref 20–32)
Calcium: 9.6 mg/dL (ref 8.6–10.3)
Chloride: 102 mmol/L (ref 98–110)
Creat: 0.86 mg/dL (ref 0.70–1.30)
Globulin: 2.6 g/dL (calc) (ref 1.9–3.7)
Glucose, Bld: 122 mg/dL — ABNORMAL HIGH (ref 65–99)
Potassium: 4.7 mmol/L (ref 3.5–5.3)
Sodium: 139 mmol/L (ref 135–146)
Total Bilirubin: 1.5 mg/dL — ABNORMAL HIGH (ref 0.2–1.2)
Total Protein: 7.1 g/dL (ref 6.1–8.1)
eGFR: 100 mL/min/{1.73_m2} (ref >=60)

## 2021-12-28 LAB — MICROALBUMIN / CREATININE URINE RATIO
Creatinine, Urine: 91 mg/dL (ref 20–320)
Microalb Creat Ratio: 102 mcg/mg creat — ABNORMAL HIGH (ref <30)
Microalb, Ur: 9.3 mg/dL

## 2021-12-28 LAB — LIPID PANEL
Cholesterol: 102 mg/dL (ref <200)
HDL: 28 mg/dL — ABNORMAL LOW (ref >=40)
Non-HDL Cholesterol (Calc): 74 mg/dL (calc) (ref <130)
Total CHOL/HDL Ratio: 3.6 (calc) (ref <5.0)
Triglycerides: 407 mg/dL — ABNORMAL HIGH (ref <150)

## 2022-02-23 ENCOUNTER — Other Ambulatory Visit: Payer: Self-pay | Admitting: Family Medicine

## 2022-02-23 DIAGNOSIS — E1121 Type 2 diabetes mellitus with diabetic nephropathy: Secondary | ICD-10-CM

## 2022-02-24 ENCOUNTER — Other Ambulatory Visit: Payer: Self-pay | Admitting: Family Medicine

## 2022-02-24 MED ORDER — SYNJARDY XR 12.5-1000 MG PO TB24: 2.0000 | ORAL_TABLET | Freq: Every day | ORAL | 0 refills | Status: DC

## 2022-02-25 ENCOUNTER — Ambulatory Visit: Payer: Self-pay | Admitting: *Deleted

## 2022-02-25 NOTE — Telephone Encounter (Signed)
Per agent: Pt stated he went to pick up Rx and was advised they authorized it for a year and hasn't been authorized since. Pt stated the pharmacy advised him a medication was ready, but it was not a medication he recognized. Pt stated if this was changed by PCP that's fine but it was not discussed with him.  Pt was unable to provide the exact name of the medication he was referring to he mentioned [place] and Xigdou. Pt stated he would be home in the next 45 minutes and have medication names.  Pt seeking clinical advice.        Chief Complaint: Medication Question Symptoms: NA Frequency: NA Pertinent Negatives: Patient denies NA Disposition: [] ED /[] Urgent Care (no appt availability in office) / [] Appointment(In office/virtual)/ []  [place] [facility]/ [] Home Care/ [] Refused Recommended Disposition /[] [place] [facility]/ [x]  Follow-up with PCP Additional Notes: Pt questioning why Xigduo was discontinued and another med ( Synjardy)prescribed. NT did see noted due to cost. Pt states new med costs more "But if that's what I'm to take that's ok." Would like clarifaction from Dr. Ancil Boozer, states may respond in [place]. States he will not pick new med up until tomorrow evening.  Reason for Disposition  [1] Caller has NON-URGENT medicine question about med that PCP prescribed AND [2] triager unable to answer question  Answer Assessment - Initial Assessment Questions 1. NAME of MEDICINE: "What medicine(s) are you calling about?"     Xigduo 2. QUESTION: "What is your question?" (e.g., double dose of medicine, side effect)     Was it discontinued? 3. PRESCRIBER: "Who prescribed the medicine?" Reason: if prescribed by specialist, call should be referred to that group.     PCP  Protocols used: Medication Question Call-A-AH

## 2022-02-26 NOTE — Telephone Encounter (Signed)
Faxed coupon to [facility]

## 2022-03-20 ENCOUNTER — Ambulatory Visit
Admission: EM | Admit: 2022-03-20 | Discharge: 2022-03-20 | Disposition: A | Discharge status: Home / Self Care | Payer: 59 | Attending: Emergency Medicine | Admitting: Emergency Medicine

## 2022-03-20 DIAGNOSIS — H04203 Unspecified epiphora, bilateral lacrimal glands: Secondary | ICD-10-CM | POA: Diagnosis not present

## 2022-03-20 DIAGNOSIS — B349 Viral infection, unspecified: Secondary | ICD-10-CM | POA: Diagnosis not present

## 2022-03-20 MED ORDER — ALUMINUM-MAGNESIUM-SIMETHICONE 200-200-20 MG/5ML PO SUSP: 30.0000 mL | Freq: Three times a day (TID) | ORAL | 0 refills | Status: DC

## 2022-03-20 MED ORDER — POLYMYXIN B-TRIMETHOPRIM 10000-0.1 UNIT/ML-% OP SOLN: 1.0000 [drp] | OPHTHALMIC | 0 refills | Status: DC

## 2022-03-20 NOTE — ED Provider Notes (Signed)
[facility]    CSN: [id number] Arrival date & time: 03/20/22  0805      History   Chief Complaint Chief Complaint  Patient presents with   Gastroesophageal Reflux   Nausea   Watery eyes    HPI Jesse Lawrence. is a 61 y.o. male.   Patient presents for evaluation of increased gas production through belching, nausea without vomiting, lower abdominal pain and low-grade fevers for 4 days.  Is experiencing watering from the left eye.  Fever peaking at 99.9.  Lower abdominal pain described as cramping has resolved 2 days ago.  Has coworkers with similar symptoms.  Exposure to strep 2 weeks ago.  Attempted use of Pepto-Bismol which did help to minimize symptoms but worsened this morning upon awakening.  Denies congestion, ear pain, sore throat, diarrhea,, cough, shortness of breath or wheezing.  Denies puslike drainage, light sensitivity, blurred vision, eye pain or redness.    Past Medical History:  Diagnosis Date   Allergy    Anxiety    COPD (chronic obstructive pulmonary disease) ([place])    Diabetes mellitus without complication ([place])    Hyperlipidemia    Hypertension    Male impotence    MI (myocardial infarction) ([place])    Obesity    PNA (pneumonia)     Patient Active Problem List   Diagnosis Date Noted   Coronary artery disease of native artery of native heart with stable angina pectoris ([place]) 11/26/2018   OSA (obstructive sleep apnea) 05/21/2015   Perennial allergic rhinitis 11/07/2014   Arteriosclerosis of coronary artery 11/07/2014   CD (contact dermatitis) 11/07/2014   Decreased libido 11/07/2014   Diabetes mellitus with renal manifestation ([place]) 11/07/2014   Gastro-esophageal reflux disease without esophagitis 11/07/2014   H/O acute myocardial infarction 11/07/2014   Hypercholesteremia 11/07/2014   Benign hypertension 11/07/2014   Hypertriglyceridemia 11/07/2014   H/O high risk medication treatment 11/07/2014   NASH (nonalcoholic steatohepatitis)  11/07/2014   Microalbuminuria 11/07/2014   Adult BMI 30+ 11/07/2014   Fungal infection of toenail 11/07/2014   Tobacco abuse 11/07/2014   ED (erectile dysfunction) of organic origin 09/29/2006    Past Surgical History:  Procedure Laterality Date   cadiac stenting     CYSTECTOMY     61 years of age   KNEE ARTHROSCOPY         Home Medications    Prior to Admission medications   Medication Sig Start Date End Date Taking? Authorizing Provider  atorvastatin (LIPITOR) 80 MG tablet Take 1 tablet (80 mg total) by mouth daily at 6 PM. 09/20/21  Yes Sowles, Drue Stager, MD  BD PEN NEEDLE NANO 2ND GEN 32G X 4 MM MISC INJECT 1 EACH INTO THE SKIN IN THE MORNING AND AT BEDTIME. 10/30/21  Yes Sowles, Drue Stager, MD  clopidogrel (PLAVIX) 75 MG tablet Take 1 tablet (75 mg total) by mouth daily. 05/29/21  Yes Gollan, Kathlene November, MD  Dulaglutide (TRULICITY) 3 FK/8.1EX SOPN Inject 3 mg as directed once a week. 12/27/21  Yes Sowles, Drue Stager, MD  Empagliflozin-metFORMIN HCl ER (SYNJARDY XR) 12.08-998 MG TB24 Take 2 tablets by mouth daily. 02/24/22  Yes Sowles, Drue Stager, MD  ezetimibe (ZETIA) 10 MG tablet Take 1 tablet (10 mg total) by mouth daily. 05/29/21  Yes Minna Merritts, MD  famotidine (PEPCID) 40 MG tablet Take 1 tablet (40 mg total) by mouth at bedtime. 09/20/21  Yes Sowles, Drue Stager, MD  fluticasone (FLONASE) 50 MCG/ACT nasal spray Place 2 sprays into both nostrils as  needed. 02/25/19  Yes Sowles, Drue Stager, MD  Glucagon (GVOKE HYPOPEN 1-PACK) 1 MG/0.2ML SOAJ Inject 1 each into the skin daily as needed. If glucose very low and unable to eat/drink 06/26/20  Yes Sowles, Drue Stager, MD  LANTUS SOLOSTAR 100 UNIT/ML Solostar Pen INJECT 20-50 UNITS INTO SKIN DAILY. 2 UNITS EVERY 3 DAYS TO KEEP FASTING BETWEEN 100-140 (MAX 50/DAY 08/30/21  Yes Bo Merino, FNP  lisinopril (ZESTRIL) 2.5 MG tablet Take 1 tablet (2.5 mg total) by mouth daily. 05/29/21  Yes Minna Merritts, MD  loratadine (CLARITIN) 10 MG tablet Take 1  tablet by mouth daily. 08/09/12  Yes [provider]  meloxicam (MOBIC) 15 MG tablet Take 1 tablet (15 mg total) by mouth daily. 09/20/21  Yes Sowles, Drue Stager, MD  metaxalone (SKELAXIN) 800 MG tablet Take 1 tablet (800 mg total) by mouth 3 (three) times daily. 09/20/21  Yes Sowles, Drue Stager, MD  metoprolol succinate (TOPROL-XL) 25 MG 24 hr tablet Take 0.5 tablets (12.5 mg total) by mouth daily. 05/29/21  Yes Minna Merritts, MD  [facility] ULTRA test strip USE AS DIRECTED 08/07/20  Yes Steele Sizer, MD  pantoprazole (PROTONIX) 40 MG tablet Take 1 tablet (40 mg total) by mouth every morning. 09/20/21  Yes Sowles, Drue Stager, MD  sildenafil (VIAGRA) 100 MG tablet TAKE 1/2 TO 1 TABLET BY MOUTH DAILY AS NEEDED FOR ERECTILE DYSFUNCTION 12/27/21  Yes Sowles, Drue Stager, MD  icosapent Ethyl (VASCEPA) 1 g capsule TAKE 2 CAPSULES BY MOUTH 2 TIMES DAILY. 09/20/21   Steele Sizer, MD  lidocaine (LIDODERM) 5 % Place 2 patches onto the skin daily. Remove & Discard patch within 12 hours or as directed by MD 09/20/21   Steele Sizer, MD  pregabalin (LYRICA) 50 MG capsule Take 1-3 capsules (50-150 mg total) by mouth 3 (three) times daily. 12/27/21   Steele Sizer, MD    Family History Family History  Problem Relation Age of Onset   Diabetes Mother    CAD Mother    Heart disease Mother    Heart attack Mother    Heart disease Father    Heart attack Father     Social History Social History   Tobacco Use   Smoking status: Former    Packs/day: 1.50    Years: 30.00    Total pack years: 45.00    Types: Cigarettes    Quit date: 04/19/2013    Years since quitting: 8.9    Passive exposure: Current   Smokeless tobacco: Current  Vaping Use   Vaping Use: Former  Substance Use Topics   Alcohol use: No    Alcohol/week: 0.0 standard drinks of alcohol    Comment: occ   Drug use: No     Allergies   Patient has no known allergies.   Review of Systems Review of Systems  Constitutional: Negative.    Eyes: Negative.   Respiratory: Negative.    Cardiovascular: Negative.   Gastrointestinal:  Positive for abdominal pain and nausea. Negative for abdominal distention, anal bleeding, blood in stool, constipation, diarrhea, rectal pain and vomiting.  Skin: Negative.      Physical Exam Triage Vital Signs ED Triage Vitals  Enc Vitals Group     BP 03/20/22 0828 130/76     Pulse Rate 03/20/22 0828 88     Resp 03/20/22 0828 16     Temp 03/20/22 0828 98.1 F (36.7 C)     Temp Source 03/20/22 0828 Oral     SpO2 03/20/22 0828 92 %  Weight 03/20/22 0824 215 lb (97.5 kg)     Height 03/20/22 0824 5' 8"  (1.727 m)     Head Circumference --      Peak Flow --      Pain Score 03/20/22 0824 0     Pain Loc --      Pain Edu? --      Excl. in [place]? --    No data found.  Updated Vital Signs BP 130/76 (BP Location: Right Arm)   Pulse 88   Temp 98.1 F (36.7 C) (Oral)   Resp 16   Ht 5' 8"  (1.727 m)   Wt 215 lb (97.5 kg)   SpO2 92%   BMI 32.69 kg/m   Visual Acuity Right Eye Distance:   Left Eye Distance:   Bilateral Distance:    Right Eye Near:   Left Eye Near:    Bilateral Near:     Physical Exam Constitutional:      Appearance: Normal appearance.  HENT:     Head: Normocephalic.  Eyes:     Comments: No abnormalities noted to the bilateral eye  Abdominal:     General: Abdomen is flat. Bowel sounds are increased.     Palpations: Abdomen is soft.     Tenderness: There is no abdominal tenderness.  Neurological:     Mental Status: He is alert and oriented to person, place, and time. Mental status is at baseline.  Psychiatric:        Mood and Affect: Mood normal.        Behavior: Behavior normal.      UC Treatments / Results  Labs (all labs ordered are listed, but only abnormal results are displayed) Labs Reviewed - No data to display  EKG   Radiology No results found.  Procedures Procedures (including critical care time)  Medications Ordered in UC Medications  - No data to display  Initial Impression / Assessment and Plan / UC Course  I have reviewed the triage vital signs and the nursing notes.  Pertinent labs & imaging results that were available during my care of the patient were reviewed by me and considered in my medical decision making (see chart for details).  Illness, watery eyes  Vital signs are stable, patient is in no signs of distress nor toxic appearing, has increased bowel sounds but no pain to the abdominal region, low suspicion for an acute abdomen, his coworkers have similar symptoms, etiology is most likely viral, discussed with patient, taking daily pantoprazole, prescribed Maalox in addition, may use Pepto-Bismol as needed, advised a bland diet until symptoms resolve  Eyes show no signs of infection on exam, discussed with patient, advised to monitor closely, Polytrim prescribed prophylactically if he begins to experience discharge, may follow-up as needed if symptoms persist or worsen Final Clinical Impressions(s) / UC Diagnoses   Final diagnoses:  None   Discharge Instructions   None    ED Prescriptions   None    PDMP not reviewed this encounter.   Hans [place], [place] 03/20/22 [phone]

## 2022-03-20 NOTE — ED Triage Notes (Signed)
Pt c/o indigestion,constant burping, nausea & watery L eye denies any drainage or pain in eye. Sx's started Monday also c/o constipation LBM yesterday denies any watery stools or diarrhea. Has tried otc pepto w/some relief.

## 2022-03-20 NOTE — Discharge Instructions (Addendum)
For your abdomen - On exam your bowel sounds are increased but there is no pain into the abdomen that would indicate a more serious cause, as your coworkers have similar symptoms this is most likely a viral process and will improve with time -You have been prescribed Maalox which is a mixture medicine to help protect the stomach from acid as well as to reduce gas, take 30 minutes before meals to help reduce symptoms, may continue use of Pepto-Bismol in addition -All symptoms are present eat a blander diet avoiding spicy or greasy foods to prevent further stomach irritation -You may follow-up with urgent care as needed  For your eyes - Symptoms may be present due to the drop in temperature, watch closely - If you begin to have puslike drainage you may begin use of Polytrim eyedrops which has been sent to the pharmacy -Avoid touching eye to prevent contamination and may remove secretions with a cool compress or neck if - May follow-up with urgent care as needed

## 2022-03-24 ENCOUNTER — Other Ambulatory Visit: Payer: Self-pay | Admitting: Family Medicine

## 2022-03-24 DIAGNOSIS — K219 Gastro-esophageal reflux disease without esophagitis: Secondary | ICD-10-CM

## 2022-03-27 NOTE — Progress Notes (Signed)
Name: Jesse Lawrence.   MRN: [id number]    DOB: 1960-12-05   Date:03/28/2022       Progress Note  Subjective  Chief Complaint  Follow Up  HPI  DM II :  His A1C was at goal for a long time, however had a gap of insurance and medication, A1C was up to 10.7 % it improved but is trending up again  he is currently on Trulicity 3 , Synjardi  and Lantus 50 units since last visit we changed from [place] to [place] per insurance request but it is more costly, he has been more bananas and going to parties, A1C today is higher went up from 7.7 % to 8.7 % , we will adjust Trulicity again to 4.5 mg weekly .He drives and eating out more often. He is due for eye exam, taking viagra for ED, has dyslipidemia and not at goal   HTN/CAD:no palpitation, denies chest pain he has a history of angina but controlled with medication, on low dose ACE and half pill metoprolol , he has been taking Atorvastatin and Zetia , he is still out of Vascepa due to cos, we will give him a voucher again .He sees cardiologist - Dr. Rockey Situ    ED: he has difficulty maintaining and erection has been doing well on Viagra, doing well now    OSA: he has not been compliant with CPAP machine, he cannot tolerated it, he is aware of the importance of wearing it  Unchanged    GERD: he takes pantoprazole in am and pepcid prn ,and doing well, had a recent episode of gastroenteritis   Obesity:weight is stable, eating out more often, discussed trying to pack food for work   Radiculitis:  he noticed left lower back pain back with radiculitis down right leg back in early June, we gave him meloxicam, skelaxin and he tried elavil ( he had for shingles) but pain has not resolved, progressing to radiculitis down both legs ( intermittently, he states a lot of times pain radiates to his groin - worse when he was getting in and out of his truck . He is now on Lyrica and doing better, taking medication prn only   Left hand pain: palm of left hand, has a  knot below left middle finger, he has pain during touch, denies trigger finger symptoms, going on for months, he does not want to see ortho at this time   Patient Active Problem List   Diagnosis Date Noted   Coronary artery disease of native artery of native heart with stable angina pectoris ([place]) 11/26/2018   OSA (obstructive sleep apnea) 05/21/2015   Perennial allergic rhinitis 11/07/2014   Arteriosclerosis of coronary artery 11/07/2014   CD (contact dermatitis) 11/07/2014   Decreased libido 11/07/2014   Diabetes mellitus with renal manifestation ([place]) 11/07/2014   Gastro-esophageal reflux disease without esophagitis 11/07/2014   H/O acute myocardial infarction 11/07/2014   Hypercholesteremia 11/07/2014   Benign hypertension 11/07/2014   Hypertriglyceridemia 11/07/2014   H/O high risk medication treatment 11/07/2014   NASH (nonalcoholic steatohepatitis) 11/07/2014   Microalbuminuria 11/07/2014   Adult BMI 30+ 11/07/2014   Fungal infection of toenail 11/07/2014   Tobacco abuse 11/07/2014   ED (erectile dysfunction) of organic origin 09/29/2006    Past Surgical History:  Procedure Laterality Date   cadiac stenting     CYSTECTOMY     61 years of age   KNEE ARTHROSCOPY      Family History  Problem Relation Age  of Onset   Diabetes Mother    CAD Mother    Heart disease Mother    Heart attack Mother    Heart disease Father    Heart attack Father     Social History   Tobacco Use   Smoking status: Former    Packs/day: 1.50    Years: 30.00    Total pack years: 45.00    Types: Cigarettes    Quit date: 04/19/2013    Years since quitting: 8.9    Passive exposure: Current   Smokeless tobacco: Current  Substance Use Topics   Alcohol use: No    Alcohol/week: 0.0 standard drinks of alcohol    Comment: occ     Current Outpatient Medications:    aluminum-magnesium hydroxide-simethicone (MAALOX) 200-200-20 MG/5ML SUSP, Take 30 mLs by mouth 4 (four) times daily -  before  meals and at bedtime., Disp: 1680 mL, Rfl: 0   atorvastatin (LIPITOR) 80 MG tablet, Take 1 tablet (80 mg total) by mouth daily at 6 PM., Disp: 90 tablet, Rfl: 1   BD PEN NEEDLE NANO 2ND GEN 32G X 4 MM MISC, INJECT 1 EACH INTO THE SKIN IN THE MORNING AND AT BEDTIME., Disp: 200 each, Rfl: 2   clopidogrel (PLAVIX) 75 MG tablet, Take 1 tablet (75 mg total) by mouth daily., Disp: 90 tablet, Rfl: 3   Dulaglutide (TRULICITY) 3 ZO/1.0RU SOPN, Inject 3 mg as directed once a week., Disp: 6 mL, Rfl: 0   Empagliflozin-metFORMIN HCl ER (SYNJARDY XR) 12.08-998 MG TB24, Take 2 tablets by mouth daily., Disp: 180 tablet, Rfl: 0   ezetimibe (ZETIA) 10 MG tablet, Take 1 tablet (10 mg total) by mouth daily., Disp: 90 tablet, Rfl: 3   famotidine (PEPCID) 40 MG tablet, Take 1 tablet (40 mg total) by mouth at bedtime., Disp: 90 tablet, Rfl: 1   fluticasone (FLONASE) 50 MCG/ACT nasal spray, Place 2 sprays into both nostrils as needed., Disp: 48 g, Rfl: 1   Glucagon (GVOKE HYPOPEN 1-PACK) 1 MG/0.2ML SOAJ, Inject 1 each into the skin daily as needed. If glucose very low and unable to eat/drink, Disp: 0.2 mL, Rfl: 1   icosapent Ethyl (VASCEPA) 1 g capsule, TAKE 2 CAPSULES BY MOUTH 2 TIMES DAILY., Disp: 360 capsule, Rfl: 1   LANTUS SOLOSTAR 100 UNIT/ML Solostar Pen, INJECT 20-50 UNITS INTO SKIN DAILY. 2 UNITS EVERY 3 DAYS TO KEEP FASTING BETWEEN 100-140 (MAX 50/DAY, Disp: 15 mL, Rfl: 5   lidocaine (LIDODERM) 5 %, Place 2 patches onto the skin daily. Remove & Discard patch within 12 hours or as directed by MD, Disp: 60 patch, Rfl: 2   lisinopril (ZESTRIL) 2.5 MG tablet, Take 1 tablet (2.5 mg total) by mouth daily., Disp: 90 tablet, Rfl: 3   loratadine (CLARITIN) 10 MG tablet, Take 1 tablet by mouth daily., Disp: , Rfl:    meloxicam (MOBIC) 15 MG tablet, Take 1 tablet (15 mg total) by mouth daily., Disp: 30 tablet, Rfl: 0   metaxalone (SKELAXIN) 800 MG tablet, Take 1 tablet (800 mg total) by mouth 3 (three) times daily., Disp: 90  tablet, Rfl: 1   metoprolol succinate (TOPROL-XL) 25 MG 24 hr tablet, Take 0.5 tablets (12.5 mg total) by mouth daily., Disp: 45 tablet, Rfl: 3   ONETOUCH ULTRA test strip, USE AS DIRECTED, Disp: 100 strip, Rfl: 11   pantoprazole (PROTONIX) 40 MG tablet, Take 1 tablet (40 mg total) by mouth every morning., Disp: 90 tablet, Rfl: 1   pregabalin (LYRICA) 50 MG  capsule, Take 1-3 capsules (50-150 mg total) by mouth 3 (three) times daily., Disp: 90 capsule, Rfl: 0   sildenafil (VIAGRA) 100 MG tablet, TAKE 1/2 TO 1 TABLET BY MOUTH DAILY AS NEEDED FOR ERECTILE DYSFUNCTION, Disp: 30 tablet, Rfl: 0   trimethoprim-polymyxin b (POLYTRIM) ophthalmic solution, Place 1 drop into both eyes every 4 (four) hours., Disp: 10 mL, Rfl: 0  No Known Allergies  I personally reviewed active problem list, medication list, allergies, family history, social history, health maintenance with the patient/caregiver today.   ROS  Constitutional: Negative for fever or weight change.  Respiratory: Negative for cough and shortness of breath.   Cardiovascular: Negative for chest pain or palpitations.  Gastrointestinal: Negative for abdominal pain, no bowel changes.  Musculoskeletal: Negative for gait problem or joint swelling.  Skin: Negative for rash.  Neurological: Negative for dizziness or headache.  No other specific complaints in a complete review of systems (except as listed in HPI above).   Objective  Vitals:   03/28/22 1028  BP: 130/84  Pulse: 92  Resp: 16  Temp: 97.6 F (36.4 C)  TempSrc: Oral  SpO2: 95%  Weight: 216 lb 3.2 oz (98.1 kg)  Height: 5' 8"  (1.727 m)    Body mass index is 32.87 kg/m.  Physical Exam  Constitutional: Patient appears well-developed and well-nourished. Obese  No distress.  HEENT: head atraumatic, normocephalic, pupils equal and reactive to light, neck supple Cardiovascular: Normal rate, regular rhythm and normal heart sounds.  No murmur heard. No BLE edema. Pulmonary/Chest:  Effort normal and breath sounds normal. No respiratory distress. Abdominal: Soft.  There is no tenderness. Psychiatric: Patient has a normal mood and affect. behavior is normal. Judgment and thought content normal.   Recent Results (from the past 2160 hour(s))  POCT HgB A1C     Status: Abnormal   Collection Time: 03/28/22 10:40 AM  Result Value Ref Range   Hemoglobin A1C 8.7 (A) 4.0 - 5.6 %   HbA1c POC (<> result, manual entry)     HbA1c, POC (prediabetic range)     HbA1c, POC (controlled diabetic range)      PHQ2/9:    03/28/2022   10:27 AM 12/27/2021    3:38 PM 09/20/2021    3:13 PM 06/06/2021    9:42 AM 05/03/2021   10:41 AM  Depression screen PHQ 2/9  Decreased Interest 0 0 0 0 0  Down, Depressed, Hopeless 0 0 0 0 0  PHQ - 2 Score 0 0 0 0 0  Altered sleeping 0 0 0 0   Tired, decreased energy 0 0 0 0   Change in appetite 0 0 0 0   Feeling bad or failure about yourself  0 0 0 0   Trouble concentrating 0 0 0 0   Moving slowly or fidgety/restless 0 0 0 0   Suicidal thoughts 0 0 0 0   PHQ-9 Score 0 0 0 0   Difficult doing work/chores Not difficult at all        phq 9 is negative   Fall Risk:    03/28/2022   10:27 AM 12/27/2021    3:38 PM 09/20/2021    3:13 PM 06/06/2021    9:41 AM 05/03/2021   10:40 AM  Fall Risk   Falls in the past year? 0 1 0 0 0  Number falls in past yr: 0 0 0 0 0  Injury with Fall? 0 0 0 0 0  Risk for fall due to : No  Fall Risks No Fall Risks No Fall Risks No Fall Risks   Follow up Falls prevention discussed;Education provided;Falls evaluation completed Falls prevention discussed Falls prevention discussed Falls prevention discussed Falls evaluation completed      Functional Status Survey: Is the patient deaf or have difficulty hearing?: No Does the patient have difficulty seeing, even when wearing glasses/contacts?: No Does the patient have difficulty concentrating, remembering, or making decisions?: No Does the patient have difficulty walking  or climbing stairs?: No Does the patient have difficulty dressing or bathing?: No Does the patient have difficulty doing errands alone such as visiting a doctor's office or shopping?: No    Assessment & Plan  1. Dyslipidemia associated with type 2 diabetes mellitus (HCC)  - POCT HgB A1C - Dulaglutide (TRULICITY) 4.5 XI/5.0TU SOPN; Inject 4.5 mg as directed once a week.  Dispense: 6 mL; Refill: 1 - VASCEPA 1 g capsule; Take 2 capsules (2 g total) by mouth 2 (two) times daily. TAKE 2 CAPSULES BY MOUTH 2 TIMES DAILY.  Dispense: 360 capsule; Refill: 1 - atorvastatin (LIPITOR) 80 MG tablet; Take 1 tablet (80 mg total) by mouth daily at 6 PM.  Dispense: 90 tablet; Refill: 1  2. Coronary artery disease of native artery of native heart with stable angina pectoris (HCC)  - VASCEPA 1 g capsule; Take 2 capsules (2 g total) by mouth 2 (two) times daily. TAKE 2 CAPSULES BY MOUTH 2 TIMES DAILY.  Dispense: 360 capsule; Refill: 1 - atorvastatin (LIPITOR) 80 MG tablet; Take 1 tablet (80 mg total) by mouth daily at 6 PM.  Dispense: 90 tablet; Refill: 1  3. Type 2 diabetes mellitus with diabetic nephropathy, without long-term current use of insulin (HCC)  - isulin glargine (LANTUS SOLOSTAR) 100 UNIT/ML Solostar Pen; Inject 50 Units into the skin daily.  Dispense: 45 mL; Refill: 1  4. Gastro-esophageal reflux disease without esophagitis  - pantoprazole (PROTONIX) 40 MG tablet; Take 1 tablet (40 mg total) by mouth every morning.  Dispense: 90 tablet; Refill: 1  5. OSA (obstructive sleep apnea)   6. Arteriosclerosis of coronary artery   7. NASH (nonalcoholic steatohepatitis)   8. Benign hypertension

## 2022-03-28 ENCOUNTER — Ambulatory Visit: Payer: 59 | Admitting: Family Medicine

## 2022-03-28 ENCOUNTER — Encounter: Payer: Self-pay | Admitting: Family Medicine

## 2022-03-28 VITALS — BP 130/84 | HR 92 | Temp 97.6°F | Resp 16 | Ht 68.0 in | Wt 216.2 lb

## 2022-03-28 DIAGNOSIS — I25118 Atherosclerotic heart disease of native coronary artery with other forms of angina pectoris: Secondary | ICD-10-CM | POA: Diagnosis not present

## 2022-03-28 DIAGNOSIS — Z794 Long term (current) use of insulin: Secondary | ICD-10-CM

## 2022-03-28 DIAGNOSIS — E1121 Type 2 diabetes mellitus with diabetic nephropathy: Secondary | ICD-10-CM

## 2022-03-28 DIAGNOSIS — E785 Hyperlipidemia, unspecified: Secondary | ICD-10-CM | POA: Diagnosis not present

## 2022-03-28 DIAGNOSIS — K7581 Nonalcoholic steatohepatitis (NASH): Secondary | ICD-10-CM

## 2022-03-28 DIAGNOSIS — K219 Gastro-esophageal reflux disease without esophagitis: Secondary | ICD-10-CM

## 2022-03-28 DIAGNOSIS — I10 Essential (primary) hypertension: Secondary | ICD-10-CM

## 2022-03-28 DIAGNOSIS — I2510 Atherosclerotic heart disease of native coronary artery without angina pectoris: Secondary | ICD-10-CM

## 2022-03-28 DIAGNOSIS — G4733 Obstructive sleep apnea (adult) (pediatric): Secondary | ICD-10-CM

## 2022-03-28 DIAGNOSIS — E1169 Type 2 diabetes mellitus with other specified complication: Secondary | ICD-10-CM | POA: Diagnosis not present

## 2022-03-28 LAB — POCT GLYCOSYLATED HEMOGLOBIN (HGB A1C): Hemoglobin A1C: 8.7 % — AB (ref 4.0–5.6)

## 2022-03-28 MED ORDER — LANTUS SOLOSTAR 100 UNIT/ML ~~LOC~~ SOPN: 50.0000 [IU] | PEN_INJECTOR | Freq: Every day | SUBCUTANEOUS | 1 refills | Status: DC

## 2022-03-28 MED ORDER — TRULICITY 4.5 MG/0.5ML ~~LOC~~ SOAJ: 4.5000 mg | SUBCUTANEOUS | 1 refills | Status: DC

## 2022-03-28 MED ORDER — ATORVASTATIN CALCIUM 80 MG PO TABS: 80.0000 mg | ORAL_TABLET | Freq: Every day | ORAL | 1 refills | Status: DC

## 2022-03-28 MED ORDER — VASCEPA 1 G PO CAPS: 2.0000 g | ORAL_CAPSULE | Freq: Two times a day (BID) | ORAL | 1 refills | Status: DC

## 2022-03-28 MED ORDER — PANTOPRAZOLE SODIUM 40 MG PO TBEC: 40.0000 mg | DELAYED_RELEASE_TABLET | Freq: Every morning | ORAL | 1 refills | Status: DC

## 2022-04-29 ENCOUNTER — Other Ambulatory Visit: Payer: Self-pay | Admitting: Family Medicine

## 2022-04-29 DIAGNOSIS — E1121 Type 2 diabetes mellitus with diabetic nephropathy: Secondary | ICD-10-CM

## 2022-05-02 ENCOUNTER — Other Ambulatory Visit: Payer: Self-pay | Admitting: Family Medicine

## 2022-05-02 ENCOUNTER — Telehealth: Payer: Self-pay | Admitting: Family Medicine

## 2022-05-02 ENCOUNTER — Other Ambulatory Visit: Payer: Self-pay

## 2022-05-02 DIAGNOSIS — E785 Hyperlipidemia, unspecified: Secondary | ICD-10-CM

## 2022-05-02 MED ORDER — XIGDUO XR 5-1000 MG PO TB24: 2.0000 | ORAL_TABLET | Freq: Every day | ORAL | 0 refills | Status: DC

## 2022-05-02 NOTE — Telephone Encounter (Signed)
The patient said that the new meds that the dr placed hm on for his sugar he did not read the label and was taking it like his old insulin which was 2 tablets two times a day and he was taking it like that but was suppose to take only 2 tablets in the morning. So he was actually taking it double. Now he is out of the meds. He said that his sugar is the best that he has ever had. Could he please get a refill on the Synjardy 12.5 - 1000mg . Says that he is out and can not get it refilled till Feb 20th, 2024. But will start taking it like the bottle says.  He said ?????? What does he do. Lol

## 2022-05-02 NOTE — Telephone Encounter (Signed)
PA submitted.

## 2022-05-02 NOTE — Telephone Encounter (Signed)
Called patient and made aware. I let him know to come by for a BMP when he is able to since he was taking too high of a dose. I also let him know you sent in the new medication in place and that insurance approved it. Patient verbalized understanding and will come in for the lab draw. Lab hours given.

## 2022-05-06 LAB — BASIC METABOLIC PANEL
BUN: 19 mg/dL (ref 7–25)
CO2: 25 mmol/L (ref 20–32)
Calcium: 9.5 mg/dL (ref 8.6–10.3)
Chloride: 103 mmol/L (ref 98–110)
Creat: 1.02 mg/dL (ref 0.70–1.30)
Glucose, Bld: 174 mg/dL — ABNORMAL HIGH (ref 65–99)
Potassium: 4.1 mmol/L (ref 3.5–5.3)
Sodium: 138 mmol/L (ref 135–146)

## 2022-05-07 ENCOUNTER — Other Ambulatory Visit: Payer: Self-pay | Admitting: Family Medicine

## 2022-05-07 DIAGNOSIS — N529 Male erectile dysfunction, unspecified: Secondary | ICD-10-CM

## 2022-06-20 ENCOUNTER — Ambulatory Visit: Payer: 59 | Admitting: Internal Medicine

## 2022-06-20 ENCOUNTER — Encounter: Payer: Self-pay | Admitting: Internal Medicine

## 2022-06-20 VITALS — BP 124/76 | HR 101 | Temp 98.0°F | Resp 16 | Ht 68.0 in | Wt 220.0 lb

## 2022-06-20 DIAGNOSIS — J069 Acute upper respiratory infection, unspecified: Secondary | ICD-10-CM

## 2022-06-20 DIAGNOSIS — R051 Acute cough: Secondary | ICD-10-CM

## 2022-06-20 LAB — POCT INFLUENZA A/B
Influenza A, POC: NEGATIVE
Influenza B, POC: NEGATIVE

## 2022-06-20 MED ORDER — METHYLPREDNISOLONE 4 MG PO TBPK: ORAL_TABLET | ORAL | 0 refills | Status: DC

## 2022-06-20 MED ORDER — BENZONATATE 100 MG PO CAPS: 100.0000 mg | ORAL_CAPSULE | Freq: Two times a day (BID) | ORAL | 0 refills | Status: DC | PRN

## 2022-06-20 NOTE — Progress Notes (Signed)
Acute Office Visit  Subjective:     Patient ID: Jesse Lawrence., male    DOB: 1960/04/30, 62 y.o.   MRN: [id number]  Chief Complaint  Patient presents with   URI    Onset since Sunday, watery eye, cough, congestion    HPI Patient is in today for congestion. Symptoms have been going on for 6 days.   URI Compliant:  -Fever: no -Cough: yes, dry -Shortness of breath: no -Wheezing: no -Nasal congestion: yes -Runny nose: yes -Post nasal drip: yes -Sore throat: no -Sinus pressure: yes -Face pain: yes -Ear pain: yes bilateral -Ear pressure: yes bilateral -Context: fluctuating -Treatments attempted: anti-histamine, pseudoephedrine    Review of Systems  Constitutional:  Negative for chills and fever.  HENT:  Positive for congestion, ear pain and sinus pain. Negative for sore throat.   Eyes:  Negative for blurred vision.  Respiratory:  Positive for cough. Negative for sputum production, shortness of breath and wheezing.   Cardiovascular:  Negative for chest pain.        Objective:    BP 124/76   Pulse (!) 101   Temp 98 F (36.7 C)   Resp 16   Ht 5\' 8"  (1.727 m)   Wt 220 lb (99.8 kg)   SpO2 95%   BMI 33.45 kg/m  BP Readings from Last 3 Encounters:  06/20/22 124/76  03/28/22 130/84  03/20/22 130/76   Wt Readings from Last 3 Encounters:  06/20/22 220 lb (99.8 kg)  03/28/22 216 lb 3.2 oz (98.1 kg)  03/20/22 215 lb (97.5 kg)      Physical Exam Constitutional:      Appearance: Normal appearance.  HENT:     Head: Normocephalic and atraumatic.     Right Ear: Tympanic membrane, ear canal and external ear normal.     Left Ear: Tympanic membrane, ear canal and external ear normal.     Nose: Congestion present.     Mouth/Throat:     Mouth: Mucous membranes are moist.     Comments: Mild PND Eyes:     Conjunctiva/sclera: Conjunctivae normal.  Cardiovascular:     Rate and Rhythm: Normal rate and regular rhythm.  Pulmonary:     Effort: Pulmonary effort is  normal.     Breath sounds: Normal breath sounds.  Skin:    General: Skin is warm and dry.  Neurological:     General: No focal deficit present.     Mental Status: He is alert. Mental status is at baseline.  Psychiatric:        Mood and Affect: Mood normal.        Behavior: Behavior normal.     Results for orders placed or performed in visit on 06/20/22  POCT Influenza A/B  Result Value Ref Range   Influenza A, POC Negative Negative   Influenza B, POC Negative Negative        Assessment & Plan:   1. Viral upper respiratory tract infection/Acute cough: Flu negative, COVID pending. Treat with Medrol dose pack, stop pseudoephedrine due to blood pressure and start Corcidin HBP. Continue anti-histamines and Flonase. Tessalon perles for cough. Follow up if symptoms worsen or fail to improve.   - methylPREDNISolone (MEDROL DOSEPAK) 4 MG TBPK tablet; Day 1: Take 8 mg (2 tablets) before breakfast, 4 mg (1 tablet) after lunch, 4 mg (1 tablet) after supper, and 8 mg (2 tablets) at bedtime. Day 2:Take 4 mg (1 tablet) before breakfast, 4 mg (1 tablet) after lunch,  4 mg (1 tablet) after supper, and 8 mg (2 tablets) at bedtime. Day 3: Take 4 mg (1 tablet) before breakfast, 4 mg (1 tablet) after lunch, 4 mg (1 tablet) after supper, and 4 mg (1 tablet) at bedtime. Day 4: Take 4 mg (1 tablet) before breakfast, 4 mg (1 tablet) after lunch, and 4 mg (1 tablet) at bedtime. Day 5: Take 4 mg (1 tablet) before breakfast and 4 mg (1 tablet) at bedtime. Day 6: Take 4 mg (1 tablet) before breakfast.  Dispense: 1 each; Refill: 0 - POCT Influenza A/B - Novel Coronavirus, NAA ([facility]) - benzonatate (TESSALON) 100 MG capsule; Take 1 capsule (100 mg total) by mouth 2 (two) times daily as needed for cough.  Dispense: 20 capsule; Refill: 0   Return if symptoms worsen or fail to improve.  Teodora Medici, DO

## 2022-06-20 NOTE — Patient Instructions (Addendum)
It was great seeing you today!  Plan discussed at today's visit: -Steroid pack sent to pharmacy -Try over the counter Coricidin for high blood pressure decongestant  -Continue allergy medications and Flonase twice daily   Follow up in: as needed   Take care and let us know if you have any questions or concerns prior to your next visit.  Dr. Rosana Berger

## 2022-06-21 LAB — NOVEL CORONAVIRUS, NAA: SARS-CoV-2, NAA: DETECTED — AB

## 2022-06-21 LAB — SPECIMEN STATUS REPORT

## 2022-06-23 ENCOUNTER — Telehealth: Payer: Self-pay

## 2022-06-23 NOTE — Telephone Encounter (Signed)
Not interested at this time

## 2022-06-23 NOTE — Telephone Encounter (Signed)
Still blowing yellow green mucous from nose was told to let u know if not feeling any better

## 2022-06-26 NOTE — Progress Notes (Signed)
Name: Jesse Lawrence.   MRN: [id number]    DOB: [birth year]   Date:06/27/2022       Progress Note  Subjective  Chief Complaint  Follow Up  HPI  DM II :  His A1C was at goal for a long time, however had a gap of insurance and medication, A1C was up to 10.7 % it improved but is trending up again  He is currently taking Lantus 50 units, Trulicity 4.5 mg, Xigduo max dose per day, , A1C had gone from 7.7 % to 8.7 % and today is down to 8.2 % we will add Actos 15 mg ( discussed possible side effects) . He spends a lot of time driving, he is eating a sandwich for lunch, he is eating jelly beans. He is due for an eye exam   HTN/CAD with stable angina : no palpitation, denies chest pain he has a history of angina but controlled with medication, on low dose ACE and half pill metoprolol , he has been taking Atorvastatin, Zetia, we gave him Vascepa voucher but unable to use and too expensive  .He sees cardiologist - Dr. Rockey Situ    ED: he has difficulty maintaining and erection has been doing well on Viagra, unchanged    OSA: he has not been compliant with CPAP machine, he cannot tolerated it, he is aware of the importance of wearing it Unchanged    GERD: he takes pantoprazole in am and pepcid prn ,and doing well. He only has symptoms when he eats spicy foods   Obesity: weight is trending down now, discussed life style modification   Radiculitis:  he noticed left lower back pain back with radiculitis down right leg back in early June, we gave him meloxicam, skelaxin and he tried elavil ( he had for shingles) but pain has not resolved, progressing to radiculitis down both legs ( intermittently, he states a lot of times pain radiates to his groin - worse when he was getting in and out of his truck . He is now on Lyrica and doing better, taking medication prn only  Unchanged   NASH : we will add actos   Patient Active Problem List   Diagnosis Date Noted   Coronary artery disease of native artery of  native heart with stable angina pectoris ([place]) 11/26/2018   OSA (obstructive sleep apnea) 05/21/2015   Perennial allergic rhinitis 11/07/2014   Arteriosclerosis of coronary artery 11/07/2014   CD (contact dermatitis) 11/07/2014   Decreased libido 11/07/2014   Diabetes mellitus with renal manifestation ([place]) 11/07/2014   Gastro-esophageal reflux disease without esophagitis 11/07/2014   H/O acute myocardial infarction 11/07/2014   Hypercholesteremia 11/07/2014   Benign hypertension 11/07/2014   Hypertriglyceridemia 11/07/2014   H/O high risk medication treatment 11/07/2014   NASH (nonalcoholic steatohepatitis) 11/07/2014   Microalbuminuria 11/07/2014   Adult BMI 30+ 11/07/2014   Fungal infection of toenail 11/07/2014   Tobacco abuse 11/07/2014   ED (erectile dysfunction) of organic origin 09/29/2006    Past Surgical History:  Procedure Laterality Date   cadiac stenting     CYSTECTOMY     62 years of age   KNEE ARTHROSCOPY      Family History  Problem Relation Age of Onset   Diabetes Mother    CAD Mother    Heart disease Mother    Heart attack Mother    Heart disease Father    Heart attack Father     Social History   Tobacco Use  Smoking status: Former    Packs/day: 1.50    Years: 30.00    Additional pack years: 0.00    Total pack years: 45.00    Types: Cigarettes    Quit date: 04/19/2013    Years since quitting: 9.1    Passive exposure: Current   Smokeless tobacco: Current  Substance Use Topics   Alcohol use: No    Alcohol/week: 0.0 standard drinks of alcohol    Comment: occ     Current Outpatient Medications:    aluminum-magnesium hydroxide-simethicone (MAALOX) 200-200-20 MG/5ML SUSP, Take 30 mLs by mouth 4 (four) times daily -  before meals and at bedtime., Disp: 1680 mL, Rfl: 0   atorvastatin (LIPITOR) 80 MG tablet, Take 1 tablet (80 mg total) by mouth daily at 6 PM., Disp: 90 tablet, Rfl: 1   BD PEN NEEDLE NANO 2ND GEN 32G X 4 MM MISC, INJECT 1 EACH  INTO THE SKIN IN THE MORNING AND AT BEDTIME., Disp: 200 each, Rfl: 2   clopidogrel (PLAVIX) 75 MG tablet, Take 1 tablet (75 mg total) by mouth daily., Disp: 90 tablet, Rfl: 3   Dapagliflozin Pro-metFORMIN ER (XIGDUO XR) 08-998 MG TB24, Take 2 tablets by mouth daily., Disp: 180 tablet, Rfl: 0   Dulaglutide (TRULICITY) 4.5 0000000 SOPN, Inject 4.5 mg as directed once a week., Disp: 6 mL, Rfl: 1   ezetimibe (ZETIA) 10 MG tablet, Take 1 tablet (10 mg total) by mouth daily., Disp: 90 tablet, Rfl: 3   famotidine (PEPCID) 40 MG tablet, Take 1 tablet (40 mg total) by mouth at bedtime., Disp: 90 tablet, Rfl: 1   fluticasone (FLONASE) 50 MCG/ACT nasal spray, Place 2 sprays into both nostrils as needed., Disp: 48 g, Rfl: 1   Glucagon (GVOKE HYPOPEN 1-PACK) 1 MG/0.2ML SOAJ, Inject 1 each into the skin daily as needed. If glucose very low and unable to eat/drink, Disp: 0.2 mL, Rfl: 1   insulin glargine (LANTUS SOLOSTAR) 100 UNIT/ML Solostar Pen, Inject 50 Units into the skin daily., Disp: 45 mL, Rfl: 1   lidocaine (LIDODERM) 5 %, Place 2 patches onto the skin daily. Remove & Discard patch within 12 hours or as directed by MD, Disp: 60 patch, Rfl: 2   lisinopril (ZESTRIL) 2.5 MG tablet, Take 1 tablet (2.5 mg total) by mouth daily., Disp: 90 tablet, Rfl: 3   loratadine (CLARITIN) 10 MG tablet, Take 1 tablet by mouth daily., Disp: , Rfl:    metaxalone (SKELAXIN) 800 MG tablet, Take 1 tablet (800 mg total) by mouth 3 (three) times daily., Disp: 90 tablet, Rfl: 1   metoprolol succinate (TOPROL-XL) 25 MG 24 hr tablet, Take 0.5 tablets (12.5 mg total) by mouth daily., Disp: 45 tablet, Rfl: 3   omega-3 acid ethyl esters (LOVAZA) 1 g capsule, Take 2 capsules (2 g total) by mouth 2 (two) times daily., Disp: 360 capsule, Rfl: 1   ONETOUCH ULTRA test strip, USE AS DIRECTED, Disp: 100 strip, Rfl: 11   pantoprazole (PROTONIX) 40 MG tablet, Take 1 tablet (40 mg total) by mouth every morning., Disp: 90 tablet, Rfl: 1    pioglitazone (ACTOS) 15 MG tablet, Take 1 tablet (15 mg total) by mouth daily., Disp: 90 tablet, Rfl: 0   pregabalin (LYRICA) 50 MG capsule, Take 1-3 capsules (50-150 mg total) by mouth 3 (three) times daily., Disp: 90 capsule, Rfl: 0   sildenafil (VIAGRA) 100 MG tablet, TAKE 1/2 TO 1 TABLET BY MOUTH DAILY AS NEEDED FOR ERECTILE DYSFUNCTION, Disp: 30 tablet, Rfl: 0  No Known Allergies  I personally reviewed active problem list, medication list, allergies, family history, social history, health maintenance with the patient/caregiver today.   ROS  Ten systems reviewed and is negative except as mentioned in HPI   Objective  Vitals:   06/27/22 1021  BP: 122/72  Pulse: 87  Resp: 16  Temp: 97.7 F (36.5 C)  TempSrc: Oral  SpO2: 92%  Weight: 216 lb 8 oz (98.2 kg)  Height: 5\' 8"  (1.727 m)    Body mass index is 32.92 kg/m.  Physical Exam  Constitutional: Patient appears well-developed and well-nourished. Obese  No distress.  HEENT: head atraumatic, normocephalic, pupils equal and reactive to light, neck supple Cardiovascular: Normal rate, regular rhythm and normal heart sounds.  No murmur heard. No BLE edema. Pulmonary/Chest: Effort normal and breath sounds normal. No respiratory distress. Abdominal: Soft.  There is no tenderness. Psychiatric: Patient has a normal mood and affect. behavior is normal. Judgment and thought content normal.   Recent Results (from the past 2160 hour(s))  Basic Metabolic Panel (BMET)     Status: Abnormal   Collection Time: 05/05/22  2:36 PM  Result Value Ref Range   Glucose, Bld 174 (H) 65 - 99 mg/dL    Comment: .            Fasting reference interval . For someone without known diabetes, a glucose value >125 mg/dL indicates that they may have diabetes and this should be confirmed with a follow-up test. .    BUN 19 7 - 25 mg/dL   Creat 1.02 0.70 - 1.30 mg/dL   BUN/Creatinine Ratio SEE NOTE: 6 - 22 (calc)    Comment:    Not Reported: BUN and  Creatinine are within    reference range. .    Sodium 138 135 - 146 mmol/L   Potassium 4.1 3.5 - 5.3 mmol/L   Chloride 103 98 - 110 mmol/L   CO2 25 20 - 32 mmol/L   Calcium 9.5 8.6 - 10.3 mg/dL  Novel Coronavirus, NAA ([facility])     Status: Abnormal   Collection Time: 06/20/22 12:00 AM   Specimen: Nasopharyngeal(NP) swabs in vial transport medium   Nasopharynge  Previous  Result Value Ref Range   SARS-CoV-2, NAA Detected (A) Not Detected    Comment: Patients who have a positive COVID-19 test result may now have treatment options. Treatment options are available for patients with mild to moderate symptoms and for hospitalized patients. Visit our website at [email or web address] for resources and information. This nucleic acid amplification test was developed and its performance characteristics determined by [employer]. Nucleic acid amplification tests include RT-PCR and TMA. This test has not been FDA cleared or approved. This test has been authorized by FDA under an Emergency Use Authorization (EUA). This test is only authorized for the duration of time the declaration that circumstances exist justifying the authorization of the emergency use of in vitro diagnostic tests for detection of SARS-CoV-2 virus and/or diagnosis of COVID-19 infection under section 564(b)(1) of the Act, 21 U.S.C. [id number]) (1), unless the authorization is terminated or revoked sooner. When diagnostic testing is negativ e, the possibility of a false negative result should be considered in the context of a patient's recent exposures and the presence of clinical signs and symptoms consistent with COVID-19. An individual without symptoms of COVID-19 and who is not shedding SARS-CoV-2 virus would expect to have a negative (not detected) result in this assay.   Specimen status report  Status: None   Collection Time: 06/20/22 12:00 AM  Result Value Ref Range   specimen status report  Comment     Comment: Please note Please note The date and/or time of collection was not indicated on the requisition as required by state and federal law.  The date of receipt of the specimen was used as the collection date if not supplied.   POCT Influenza A/B     Status: None   Collection Time: 06/20/22  1:25 PM  Result Value Ref Range   Influenza A, POC Negative Negative   Influenza B, POC Negative Negative  POCT HgB A1C     Status: Abnormal   Collection Time: 06/27/22 10:23 AM  Result Value Ref Range   Hemoglobin A1C 8.2 (A) 4.0 - 5.6 %   HbA1c POC (<> result, manual entry)     HbA1c, POC (prediabetic range)     HbA1c, POC (controlled diabetic range)      PHQ2/9:    06/27/2022   10:23 AM 06/20/2022    1:21 PM 03/28/2022   10:27 AM 12/27/2021    3:38 PM 09/20/2021    3:13 PM  Depression screen PHQ 2/9  Decreased Interest 0 0 0 0 0  Down, Depressed, Hopeless 0 0 0 0 0  PHQ - 2 Score 0 0 0 0 0  Altered sleeping 0 0 0 0 0  Tired, decreased energy 0 0 0 0 0  Change in appetite 0 0 0 0 0  Feeling bad or failure about yourself  0 0 0 0 0  Trouble concentrating 0 0 0 0 0  Moving slowly or fidgety/restless 0 0 0 0 0  Suicidal thoughts 0 0 0 0 0  PHQ-9 Score 0 0 0 0 0  Difficult doing work/chores  Not difficult at all Not difficult at all      phq 9 is negative   Fall Risk:    06/27/2022   10:22 AM 06/20/2022    1:21 PM 03/28/2022   10:27 AM 12/27/2021    3:38 PM 09/20/2021    3:13 PM  Fall Risk   Falls in the past year? 0 0 0 1 0  Number falls in past yr:  0 0 0 0  Injury with Fall?  0 0 0 0  Risk for fall due to : No Fall Risks  No Fall Risks No Fall Risks No Fall Risks  Follow up Falls prevention discussed  Falls prevention discussed;Education provided;Falls evaluation completed Falls prevention discussed Falls prevention discussed      Functional Status Survey: Is the patient deaf or have difficulty hearing?: No Does the patient have difficulty seeing, even  when wearing glasses/contacts?: No Does the patient have difficulty concentrating, remembering, or making decisions?: No Does the patient have difficulty walking or climbing stairs?: No Does the patient have difficulty dressing or bathing?: No Does the patient have difficulty doing errands alone such as visiting a doctor's office or shopping?: No    Assessment & Plan  1. Dyslipidemia associated with type 2 diabetes mellitus (HCC)  - POCT HgB A1C - pioglitazone (ACTOS) 15 MG tablet; Take 1 tablet (15 mg total) by mouth daily.  Dispense: 90 tablet; Refill: 0 - omega-3 acid ethyl esters (LOVAZA) 1 g capsule; Take 2 capsules (2 g total) by mouth 2 (two) times daily.  Dispense: 360 capsule; Refill: 1  2. Coronary artery disease of native artery of native heart with stable angina pectoris ([place])  Keep follow up with  cardiologist   3. Arteriosclerosis of coronary artery  - pioglitazone (ACTOS) 15 MG tablet; Take 1 tablet (15 mg total) by mouth daily.  Dispense: 90 tablet; Refill: 0  4. OSA (obstructive sleep apnea)  Not compliant with CPAP   5. NASH (nonalcoholic steatohepatitis)  - pioglitazone (ACTOS) 15 MG tablet; Take 1 tablet (15 mg total) by mouth daily.  Dispense: 90 tablet; Refill: 0  6. Type 2 diabetes mellitus with diabetic nephropathy, without long-term current use of insulin (HCC)  - omega-3 acid ethyl esters (LOVAZA) 1 g capsule; Take 2 capsules (2 g total) by mouth 2 (two) times daily.  Dispense: 360 capsule; Refill: 1  7. Benign hypertension

## 2022-06-27 ENCOUNTER — Encounter: Payer: Self-pay | Admitting: Family Medicine

## 2022-06-27 ENCOUNTER — Ambulatory Visit: Payer: 59 | Admitting: Family Medicine

## 2022-06-27 VITALS — BP 122/72 | HR 87 | Temp 97.7°F | Resp 16 | Ht 68.0 in | Wt 216.5 lb

## 2022-06-27 DIAGNOSIS — E1121 Type 2 diabetes mellitus with diabetic nephropathy: Secondary | ICD-10-CM

## 2022-06-27 DIAGNOSIS — I25118 Atherosclerotic heart disease of native coronary artery with other forms of angina pectoris: Secondary | ICD-10-CM

## 2022-06-27 DIAGNOSIS — E1169 Type 2 diabetes mellitus with other specified complication: Secondary | ICD-10-CM

## 2022-06-27 DIAGNOSIS — G4733 Obstructive sleep apnea (adult) (pediatric): Secondary | ICD-10-CM

## 2022-06-27 DIAGNOSIS — I10 Essential (primary) hypertension: Secondary | ICD-10-CM

## 2022-06-27 DIAGNOSIS — I2510 Atherosclerotic heart disease of native coronary artery without angina pectoris: Secondary | ICD-10-CM | POA: Diagnosis not present

## 2022-06-27 DIAGNOSIS — E785 Hyperlipidemia, unspecified: Secondary | ICD-10-CM

## 2022-06-27 DIAGNOSIS — K7581 Nonalcoholic steatohepatitis (NASH): Secondary | ICD-10-CM

## 2022-06-27 LAB — POCT GLYCOSYLATED HEMOGLOBIN (HGB A1C): Hemoglobin A1C: 8.2 % — AB (ref 4.0–5.6)

## 2022-06-27 MED ORDER — PIOGLITAZONE HCL 15 MG PO TABS: 15.0000 mg | ORAL_TABLET | Freq: Every day | ORAL | 0 refills | Status: DC

## 2022-06-27 MED ORDER — OMEGA-3-ACID ETHYL ESTERS 1 G PO CAPS: 2.0000 g | ORAL_CAPSULE | Freq: Two times a day (BID) | ORAL | 1 refills | Status: DC

## 2022-07-18 ENCOUNTER — Other Ambulatory Visit: Payer: Self-pay | Admitting: Family Medicine

## 2022-07-18 DIAGNOSIS — N529 Male erectile dysfunction, unspecified: Secondary | ICD-10-CM

## 2022-08-01 ENCOUNTER — Other Ambulatory Visit: Payer: Self-pay | Admitting: Family Medicine

## 2022-08-03 ENCOUNTER — Other Ambulatory Visit: Payer: Self-pay | Admitting: Family Medicine

## 2022-08-04 ENCOUNTER — Other Ambulatory Visit: Payer: Self-pay

## 2022-08-06 NOTE — Telephone Encounter (Signed)
He wants xigduo using coupon and cheaper

## 2022-08-16 ENCOUNTER — Other Ambulatory Visit: Payer: Self-pay | Admitting: Family Medicine

## 2022-08-16 DIAGNOSIS — N529 Male erectile dysfunction, unspecified: Secondary | ICD-10-CM

## 2022-08-18 ENCOUNTER — Other Ambulatory Visit: Payer: Self-pay

## 2022-08-18 DIAGNOSIS — N529 Male erectile dysfunction, unspecified: Secondary | ICD-10-CM

## 2022-08-18 MED ORDER — SILDENAFIL CITRATE 100 MG PO TABS: ORAL_TABLET | ORAL | 0 refills | Status: DC

## 2022-08-26 ENCOUNTER — Other Ambulatory Visit: Payer: Self-pay | Admitting: Family Medicine

## 2022-08-26 DIAGNOSIS — I25118 Atherosclerotic heart disease of native coronary artery with other forms of angina pectoris: Secondary | ICD-10-CM

## 2022-09-07 ENCOUNTER — Other Ambulatory Visit: Payer: Self-pay | Admitting: Cardiovascular Disease

## 2022-09-07 DIAGNOSIS — I25118 Atherosclerotic heart disease of native coronary artery with other forms of angina pectoris: Secondary | ICD-10-CM

## 2022-09-07 DIAGNOSIS — E1121 Type 2 diabetes mellitus with diabetic nephropathy: Secondary | ICD-10-CM

## 2022-09-07 DIAGNOSIS — E1169 Type 2 diabetes mellitus with other specified complication: Secondary | ICD-10-CM

## 2022-09-07 DIAGNOSIS — E7800 Pure hypercholesterolemia, unspecified: Secondary | ICD-10-CM

## 2022-09-07 DIAGNOSIS — I10 Essential (primary) hypertension: Secondary | ICD-10-CM

## 2022-09-08 NOTE — Telephone Encounter (Signed)
Pt scheduled on 6/21

## 2022-09-08 NOTE — Telephone Encounter (Signed)
Hi,   Could you schedule this patient a 12 month follow up visit? The patient was last seen by Dr. Mariah Milling on 05-30-21. Thank you so much.

## 2022-09-12 ENCOUNTER — Other Ambulatory Visit: Payer: Self-pay | Admitting: Family Medicine

## 2022-09-12 DIAGNOSIS — E1169 Type 2 diabetes mellitus with other specified complication: Secondary | ICD-10-CM

## 2022-09-12 NOTE — Telephone Encounter (Signed)
Medication Refill - Medication: Dulaglutide (TRULICITY) 4.5 MG/0.5ML SOPN   Has the patient contacted their pharmacy? Yes.   Pt told to contact provider  Preferred Pharmacy (with phone number or street name):  [id number] [place], [place] - [address] Phone: [phone]  Fax: [phone]     Has the patient been seen for an appointment in the last year OR does the patient have an upcoming appointment? Yes.    Agent: Please be advised that RX refills may take up to 3 business days. We ask that you follow-up with your pharmacy.

## 2022-09-12 NOTE — Telephone Encounter (Signed)
Requested Prescriptions  Refused Prescriptions Disp Refills   Dulaglutide (TRULICITY) 4.5 MG/0.5ML SOPN 2 mL 0    Sig: Inject 4.5 mg as directed once a week.     Endocrinology:  Diabetes - GLP-1 Receptor Agonists Failed - 09/12/2022  2:01 PM      Failed - HBA1C is between 0 and 7.9 and within 180 days    Hemoglobin A1C  Date Value Ref Range Status  06/27/2022 8.2 (A) 4.0 - 5.6 % Final   HbA1c, POC (controlled diabetic range)  Date Value Ref Range Status  11/26/2018 5.7 0.0 - 7.0 % Final   Hgb A1c MFr Bld  Date Value Ref Range Status  06/06/2021 8.3 (H) <5.7 % of total Hgb Final    Comment:    For someone without known diabetes, a hemoglobin A1c value of 6.5% or greater indicates that they may have  diabetes and this should be confirmed with a follow-up  test. . For someone with known diabetes, a value <7% indicates  that their diabetes is well controlled and a value  greater than or equal to 7% indicates suboptimal  control. A1c targets should be individualized based on  duration of diabetes, age, comorbid conditions, and  other considerations. . Currently, no consensus exists regarding use of hemoglobin A1c for diagnosis of diabetes for children. Verna Czech - Valid encounter within last 6 months    Recent Outpatient Visits           2 months ago Dyslipidemia associated with type 2 diabetes mellitus [facility])   [place] [facility] Alba Cory, MD   2 months ago Viral upper respiratory tract infection   [facility] Margarita Mail, DO   5 months ago Dyslipidemia associated with type 2 diabetes mellitus [facility])   [place] [facility] Alba Cory, MD   8 months ago Controlled type 2 diabetes mellitus with diabetic nephropathy, with long-term current use of insulin [facility])   [place] [facility] Alba Cory, MD   11 months ago Type 2 diabetes mellitus with diabetic nephropathy,  without long-term current use of insulin [facility])   [place] [facility] Alba Cory, MD       Future Appointments             In 2 weeks Brion Aliment, Dois [place]en[place], NP [facility] [place]   In 2 weeks Alba Cory, MD [facility], [facility]

## 2022-09-22 ENCOUNTER — Other Ambulatory Visit: Payer: Self-pay | Admitting: Family Medicine

## 2022-09-22 DIAGNOSIS — I2510 Atherosclerotic heart disease of native coronary artery without angina pectoris: Secondary | ICD-10-CM

## 2022-09-22 DIAGNOSIS — K7581 Nonalcoholic steatohepatitis (NASH): Secondary | ICD-10-CM

## 2022-09-22 DIAGNOSIS — E1169 Type 2 diabetes mellitus with other specified complication: Secondary | ICD-10-CM
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
## 2022-09-25 NOTE — Progress Notes (Signed)
Office Visit    Patient Name: Jesse Lawrence. Date of Encounter: 09/26/2022  Primary Care Provider:  Alba Cory, MD Primary Cardiologist:  Julien Nordmann, MD  Chief Complaint    62 year old male with past medical history of CAD (cath with stents in 2002 and 2003), ISR in 2007 (TPA then cath 3 days later), HTN, GERD, COPD, diabetes mellitus type II. He presents today for follow up regarding his CAD.   Past Medical History    Past Medical History:  Diagnosis Date   Allergy    Anxiety    COPD (chronic obstructive pulmonary disease) (HCC)    Coronary artery disease    Diabetes mellitus without complication (HCC)    Hyperlipidemia    Hypertension    Male impotence    MI (myocardial infarction) (HCC)    Obesity    PNA (pneumonia)    Past Surgical History:  Procedure Laterality Date   cadiac stenting     CYSTECTOMY     62 years of age   KNEE ARTHROSCOPY     Allergies No Known Allergies Labs/Other Studies Reviewed    The following studies were reviewed today:    Recent Labs: 12/27/2021: ALT 25 05/05/2022: BUN 19; Creat 1.02; Potassium 4.1; Sodium 138  Recent Lipid Panel    Component Value Date/Time   CHOL 102 12/27/2021 1615   CHOL 150 08/17/2015 1000   CHOL 143 08/02/2011 0935   TRIG 407 (H) 12/27/2021 1615   TRIG 372 (H) 08/02/2011 0935   HDL 28 (L) 12/27/2021 1615   HDL 24 (L) 08/17/2015 1000   HDL 20 (L) 08/02/2011 0935   CHOLHDL 3.6 12/27/2021 1615   VLDL NOT CALC 05/26/2016 1026   VLDL 74 (H) 08/02/2011 0935   LDLCALC  12/27/2021 1615     Comment:     . LDL cholesterol not calculated. Triglyceride levels greater than 400 mg/dL invalidate calculated LDL results. . Reference range: <100 . Desirable range <100 mg/dL for primary prevention;   <70 mg/dL for patients with CHD or diabetic patients  with > or = 2 CHD risk factors. Marland Kitchen LDL-C is now calculated using the Martin-Hopkins  calculation, which is a validated novel method providing  better  accuracy than the Friedewald equation in the  estimation of LDL-C.  Horald Pollen et al. Lenox Ahr. 8119;147(82): 2061-2068  (http://education.[facility].com/faq/FAQ164)    LDLCALC 49 08/02/2011 0935   History of Present Illness  62 year old male with past medical history of CAD (cath with stents in 2002 and 2003), ISR in 2007 (TPA then cath 3 days later), HTN, GERD, COPD, diabetes mellitus type II. He presents today for follow up regarding his CAD.   He was last seen by Dr. Mariah Milling in February, 2023. At that time was doing well, denied significant shortness of breath or chest pain on exertion. Reported some breathlessness while pulling his garbage can uphill, felt to be related to conditioning.   Today he reports he is doing very well overall. Recently returned from vacation to [place]. His only concern is his knee pain that he plans to discuss with his PCP on Monday. He denies chest pain, palpitations, dyspnea, pnd, orthopnea, n, v, dizziness, syncope, weight gain, or early satiety. He notes some slight end of day lower extremity swelling, feels left is worse than right due to prior knee injury, relieves overnight.   Home Medications    Current Outpatient Medications  Medication Sig Dispense Refill   atorvastatin (LIPITOR) 80 MG tablet  Take 1 tablet (80 mg total) by mouth daily at 6 PM. 90 tablet 1   BD PEN NEEDLE NANO 2ND GEN 32G X 4 MM MISC INJECT 1 EACH INTO THE SKIN IN THE MORNING AND AT BEDTIME. 200 each 2   clopidogrel (PLAVIX) 75 MG tablet Take 1 tablet (75 mg total) by mouth daily. 90 tablet 3   Dulaglutide (TRULICITY) 4.5 MG/0.5ML SOPN INJECT 4.5 MG AS DIRECTED ONCE A WEEK. 2 mL 0   ezetimibe (ZETIA) 10 MG tablet TAKE 1 TABLET BY MOUTH EVERY DAY 30 tablet 0   famotidine (PEPCID) 40 MG tablet Take 1 tablet (40 mg total) by mouth at bedtime. 90 tablet 1   fluticasone (FLONASE) 50 MCG/ACT nasal spray Place 2 sprays into both nostrils as needed. 48 g 1   Glucagon (GVOKE HYPOPEN 1-PACK) 1  MG/0.2ML SOAJ Inject 1 each into the skin daily as needed. If glucose very low and unable to eat/drink 0.2 mL 1   insulin glargine (LANTUS SOLOSTAR) 100 UNIT/ML Solostar Pen Inject 50 Units into the skin daily. 45 mL 1   lidocaine (LIDODERM) 5 % Place 2 patches onto the skin daily. Remove & Discard patch within 12 hours or as directed by MD 60 patch 2   lisinopril (ZESTRIL) 2.5 MG tablet TAKE 1 TABLET BY MOUTH EVERY DAY 30 tablet 0   loratadine (CLARITIN) 10 MG tablet Take 1 tablet by mouth daily.     metaxalone (SKELAXIN) 800 MG tablet Take 1 tablet (800 mg total) by mouth 3 (three) times daily. 90 tablet 1   metoprolol succinate (TOPROL-XL) 25 MG 24 hr tablet TAKE 1/2 TABLET BY MOUTH EVERY DAY 15 tablet 0   omega-3 acid ethyl esters (LOVAZA) 1 g capsule Take 2 capsules (2 g total) by mouth 2 (two) times daily. 360 capsule 1   pantoprazole (PROTONIX) 40 MG tablet Take 1 tablet (40 mg total) by mouth every morning. 90 tablet 1   pioglitazone (ACTOS) 15 MG tablet TAKE 1 TABLET (15 MG TOTAL) BY MOUTH DAILY. 90 tablet 0   sildenafil (VIAGRA) 100 MG tablet TAKE 1/2 TO 1 TABLET BY MOUTH DAILY AS NEEDED FOR ERECTILE DYSFUNCTION 30 tablet 0   XIGDUO XR 08-998 MG TB24 TAKE 2 TABLETS BY MOUTH EVERY DAY 180 tablet 0   ONETOUCH ULTRA test strip USE AS DIRECTED (Patient not taking: Reported on 09/26/2022) 100 strip 11   pregabalin (LYRICA) 50 MG capsule Take 1-3 capsules (50-150 mg total) by mouth 3 (three) times daily. (Patient not taking: Reported on 09/26/2022) 90 capsule 0   No current facility-administered medications for this visit.    Review of Systems    He denies chest pain, palpitations, dyspnea, pnd, orthopnea, n, v, dizziness, syncope, weight gain, or early satiety. All other systems reviewed and are otherwise negative except as noted above.     Physical Exam    VS:  BP 110/62 (BP Location: Left Arm, Patient Position: Sitting, Cuff Size: Normal)   Pulse 84   Ht 5\' 8"  (1.727 m)   Wt 220 lb  (99.8 kg)   SpO2 93%   BMI 33.45 kg/m  , BMI Body mass index is 33.45 kg/m.     GEN: Well nourished, well developed, in no acute distress. HEENT: normal. Neck: Supple, no JVD, carotid bruits, or masses. Cardiac: RRR, no murmurs, rubs, or gallops. No clubbing, cyanosis, edema.  Radials/DP/PT 2+ and equal bilaterally.  Respiratory:  Respirations regular and unlabored, clear to auscultation bilaterally. GI: Soft, nontender, nondistended,  BS + x 4. MS: no deformity or atrophy. Skin: warm and dry, no rash. Neuro:  Strength and sensation are intact. Psych: Normal affect, pleasant   Accessory Clinical Findings    ECG personally reviewed by me today - Normal sinus rhythm at 84bpm no acute ST/T wave changes   Lab Results  Component Value Date   WBC 8.5 02/04/2021   HGB 16.3 02/04/2021   HCT 47.5 02/04/2021   MCV 93.0 02/04/2021   PLT 284 02/04/2021   Lab Results  Component Value Date   CREATININE 1.02 05/05/2022   BUN 19 05/05/2022   NA 138 05/05/2022   K 4.1 05/05/2022   CL 103 05/05/2022   CO2 25 05/05/2022   Lab Results  Component Value Date   ALT 25 12/27/2021   AST 16 12/27/2021   ALKPHOS 66 05/26/2016   BILITOT 1.5 (H) 12/27/2021   Lab Results  Component Value Date   CHOL 102 12/27/2021   HDL 28 (L) 12/27/2021   LDLCALC  12/27/2021     Comment:     . LDL cholesterol not calculated. Triglyceride levels greater than 400 mg/dL invalidate calculated LDL results. . Reference range: <100 . Desirable range <100 mg/dL for primary prevention;   <70 mg/dL for patients with CHD or diabetic patients  with > or = 2 CHD risk factors. Marland Kitchen LDL-C is now calculated using the Martin-Hopkins  calculation, which is a validated novel method providing  better accuracy than the Friedewald equation in the  estimation of LDL-C.  Horald Pollen et al. Lenox Ahr. 4098;119(14): 2061-2068  (http://education.[facility].com/faq/FAQ164)    TRIG 407 (H) 12/27/2021   CHOLHDL 3.6 12/27/2021     Lab Results  Component Value Date   HGBA1C 8.2 (A) 06/27/2022    Assessment & Plan    CAD: Cardiac catheterization with stent placement to in 2002 and 2003, he had stent reocclusion in 2007. Today he denies any anginal symptoms, reports he is doing very well. He recently returned from vacation, he went horse back riding and walked a significant amount with no anginal symptoms. His blood pressure is well controlled. Last lipid panel from 9/23 his LDL was unable to be calculated, as his triglycerides were 407. Cholesterol was 102. He will have repeat on Monday with his primary care provider. Continue lisionpril, metoprolol succinate, atorvastatin, ezetimibe and plavix.    Hypertension: Blood pressure well controlled today at 110/62. Continue lisinopril 2.5mg  daily and metoprolol succinate 25mg  daily   Hyperlipidemia: Last lipid panel from 9/23 his LDL was unable to be calculated, as his triglycerides were 407. Cholesterol was 102. He plans to have lipid panel done on Monday at his PCP visit. Continue atorvastatin 80mg  daily and ezetimibe 10mg  daily   Diabetes mellitus: Last hemoglobin A1C was 8.2 on 06/27/22. Followed by PCP     Follow up in one year with Ward Givens, NP or Dr. Mariah Milling.   Rip Harbour, NP 09/26/2022, 8:45 AM

## 2022-09-26 ENCOUNTER — Encounter: Payer: Self-pay | Admitting: Nurse Practitioner

## 2022-09-26 ENCOUNTER — Ambulatory Visit: Payer: 59 | Attending: Nurse Practitioner | Admitting: Cardiology

## 2022-09-26 VITALS — BP 110/62 | HR 84 | Ht 68.0 in | Wt 220.0 lb

## 2022-09-26 DIAGNOSIS — Z794 Long term (current) use of insulin: Secondary | ICD-10-CM

## 2022-09-26 DIAGNOSIS — E785 Hyperlipidemia, unspecified: Secondary | ICD-10-CM

## 2022-09-26 DIAGNOSIS — I10 Essential (primary) hypertension: Secondary | ICD-10-CM

## 2022-09-26 DIAGNOSIS — E1169 Type 2 diabetes mellitus with other specified complication: Secondary | ICD-10-CM

## 2022-09-26 DIAGNOSIS — I25118 Atherosclerotic heart disease of native coronary artery with other forms of angina pectoris: Secondary | ICD-10-CM

## 2022-09-26 NOTE — Patient Instructions (Signed)
Medication Instructions:  No changes *If you need a refill on your cardiac medications before your next appointment, please call your pharmacy*   Lab Work: Please have fasting lipids drawn at your PCP appointment on Monday  If you have labs (blood work) drawn today and your tests are completely normal, you will receive your results only by: MyChart Message (if you have MyChart) OR A paper copy in the mail If you have any lab test that is abnormal or we need to change your treatment, we will call you to review the results.   Testing/Procedures: None ordered   Follow-Up: At [facility], you and your health needs are our priority.  As part of our continuing mission to provide you with exceptional heart care, we have created designated Provider Care Teams.  These Care Teams include your primary Cardiologist (physician) and Advanced Practice Providers (APPs -  Physician Assistants and Nurse Practitioners) who all work together to provide you with the care you need, when you need it.  We recommend signing up for the patient portal called "MyChart".  Sign up information is provided on this After Visit Summary.  MyChart is used to connect with patients for Virtual Visits (Telemedicine).  Patients are able to view lab/test results, encounter notes, upcoming appointments, etc.  Non-urgent messages can be sent to your provider as well.   To learn more about what you can do with MyChart, go to [email or web address].    Your next appointment:   12 month(s)  Provider:   You may see  Dr. Mariah Milling or one of the following Advanced Practice Providers on your designated Care Team:   Nicolasa Ducking, NP Eula Listen, PA-C Cadence Fransico Michael, PA-C Charlsie Quest, NP

## 2022-09-29 ENCOUNTER — Ambulatory Visit: Payer: 59 | Admitting: Family Medicine

## 2022-09-29 ENCOUNTER — Encounter: Payer: Self-pay | Admitting: Family Medicine

## 2022-09-29 ENCOUNTER — Other Ambulatory Visit: Payer: Self-pay | Admitting: Family Medicine

## 2022-09-29 VITALS — BP 126/68 | HR 82 | Resp 16 | Ht 68.0 in | Wt 220.0 lb

## 2022-09-29 DIAGNOSIS — Z7985 Long-term (current) use of injectable non-insulin antidiabetic drugs: Secondary | ICD-10-CM | POA: Diagnosis not present

## 2022-09-29 DIAGNOSIS — Z1211 Encounter for screening for malignant neoplasm of colon: Secondary | ICD-10-CM

## 2022-09-29 DIAGNOSIS — M5417 Radiculopathy, lumbosacral region: Secondary | ICD-10-CM

## 2022-09-29 DIAGNOSIS — K7581 Nonalcoholic steatohepatitis (NASH): Secondary | ICD-10-CM

## 2022-09-29 DIAGNOSIS — E785 Hyperlipidemia, unspecified: Secondary | ICD-10-CM

## 2022-09-29 DIAGNOSIS — Z794 Long term (current) use of insulin: Secondary | ICD-10-CM

## 2022-09-29 DIAGNOSIS — E1121 Type 2 diabetes mellitus with diabetic nephropathy: Secondary | ICD-10-CM | POA: Diagnosis not present

## 2022-09-29 DIAGNOSIS — E1169 Type 2 diabetes mellitus with other specified complication: Secondary | ICD-10-CM

## 2022-09-29 DIAGNOSIS — I25118 Atherosclerotic heart disease of native coronary artery with other forms of angina pectoris: Secondary | ICD-10-CM

## 2022-09-29 DIAGNOSIS — K219 Gastro-esophageal reflux disease without esophagitis: Secondary | ICD-10-CM

## 2022-09-29 DIAGNOSIS — Z79899 Other long term (current) drug therapy: Secondary | ICD-10-CM

## 2022-09-29 DIAGNOSIS — J3089 Other allergic rhinitis: Secondary | ICD-10-CM

## 2022-09-29 DIAGNOSIS — N529 Male erectile dysfunction, unspecified: Secondary | ICD-10-CM

## 2022-09-29 DIAGNOSIS — I10 Essential (primary) hypertension: Secondary | ICD-10-CM

## 2022-09-29 DIAGNOSIS — M25562 Pain in left knee: Secondary | ICD-10-CM

## 2022-09-29 LAB — POCT GLYCOSYLATED HEMOGLOBIN (HGB A1C): Hemoglobin A1C: 7.8 % — AB (ref 4.0–5.6)

## 2022-09-29 MED ORDER — FLUTICASONE PROPIONATE 50 MCG/ACT NA SUSP
2.0000 | NASAL | 1 refills | Status: AC | PRN
Start: 2022-09-29 — End: ?

## 2022-09-29 MED ORDER — PREGABALIN 100 MG PO CAPS
100.0000 mg | ORAL_CAPSULE | Freq: Every evening | ORAL | 1 refills | Status: DC
Start: 2022-09-29 — End: 2023-04-24

## 2022-09-29 MED ORDER — TRULICITY 4.5 MG/0.5ML ~~LOC~~ SOAJ: 4.5000 mg | SUBCUTANEOUS | 0 refills | Status: DC

## 2022-09-29 MED ORDER — PANTOPRAZOLE SODIUM 40 MG PO TBEC: 40.0000 mg | DELAYED_RELEASE_TABLET | Freq: Every morning | ORAL | 1 refills | Status: DC

## 2022-09-29 MED ORDER — BD PEN NEEDLE NANO 2ND GEN 32G X 4 MM MISC: 1.0000 | Freq: Two times a day (BID) | 2 refills | Status: DC

## 2022-09-29 MED ORDER — LANTUS SOLOSTAR 100 UNIT/ML ~~LOC~~ SOPN
50.0000 [IU] | PEN_INJECTOR | Freq: Every day | SUBCUTANEOUS | 1 refills | Status: DC
Start: 2022-09-29 — End: 2023-03-23

## 2022-09-29 MED ORDER — DAPAGLIFLOZIN PRO-METFORMIN ER 5-1000 MG PO TB24: 2.0000 | ORAL_TABLET | Freq: Every day | ORAL | 1 refills | Status: DC

## 2022-09-29 MED ORDER — PIOGLITAZONE HCL 15 MG PO TABS
15.0000 mg | ORAL_TABLET | Freq: Every day | ORAL | 1 refills | Status: DC
Start: 2022-09-29 — End: 2022-12-22

## 2022-09-29 MED ORDER — FAMOTIDINE 40 MG PO TABS: 40.0000 mg | ORAL_TABLET | Freq: Every day | ORAL | 1 refills | Status: DC

## 2022-09-29 MED ORDER — OMEGA-3-ACID ETHYL ESTERS 1 G PO CAPS: 2.0000 g | ORAL_CAPSULE | Freq: Two times a day (BID) | ORAL | 1 refills | Status: DC

## 2022-09-29 MED ORDER — EZETIMIBE 10 MG PO TABS: 10.0000 mg | ORAL_TABLET | Freq: Every day | ORAL | 1 refills | Status: DC

## 2022-09-29 MED ORDER — SILDENAFIL CITRATE 100 MG PO TABS
ORAL_TABLET | ORAL | 0 refills | Status: DC
Start: 2022-09-29 — End: 2022-10-27

## 2022-09-29 MED ORDER — INSULIN PEN NEEDLE 32G X 6 MM MISC
1.0000 | Freq: Every day | 1 refills | Status: DC
Start: 2022-09-29 — End: 2022-10-07

## 2022-09-29 MED ORDER — LISINOPRIL 2.5 MG PO TABS: 2.5000 mg | ORAL_TABLET | Freq: Every day | ORAL | 1 refills | Status: DC

## 2022-09-29 MED ORDER — ATORVASTATIN CALCIUM 80 MG PO TABS: 80.0000 mg | ORAL_TABLET | Freq: Every day | ORAL | 1 refills | Status: DC

## 2022-09-29 MED ORDER — CLOPIDOGREL BISULFATE 75 MG PO TABS: 75.0000 mg | ORAL_TABLET | Freq: Every day | ORAL | 1 refills | Status: DC

## 2022-09-29 MED ORDER — METOPROLOL SUCCINATE ER 25 MG PO TB24
12.5000 mg | ORAL_TABLET | Freq: Every day | ORAL | 1 refills | Status: DC
Start: 2022-09-29 — End: 2023-04-14

## 2022-09-29 MED ORDER — METAXALONE 800 MG PO TABS
800.0000 mg | ORAL_TABLET | Freq: Three times a day (TID) | ORAL | 1 refills | Status: AC
Start: 2022-09-29 — End: ?

## 2022-10-01 ENCOUNTER — Telehealth: Payer: Self-pay | Admitting: Cardiovascular Disease

## 2022-10-01 ENCOUNTER — Telehealth: Payer: Self-pay

## 2022-10-01 ENCOUNTER — Other Ambulatory Visit: Payer: Self-pay | Admitting: *Deleted

## 2022-10-01 ENCOUNTER — Telehealth: Payer: Self-pay | Admitting: *Deleted

## 2022-10-01 DIAGNOSIS — Z86010 Personal history of colonic polyps: Secondary | ICD-10-CM

## 2022-10-01 MED ORDER — NA SULFATE-K SULFATE-MG SULF 17.5-3.13-1.6 GM/177ML PO SOLN: 1.0000 | Freq: Once | ORAL | 0 refills | Status: AC

## 2022-10-01 NOTE — Telephone Encounter (Signed)
Gastroenterology Pre-Procedure Review  Request Date: 12/05/2022 Requesting Physician: Dr. Servando Snare  PATIENT REVIEW QUESTIONS: The patient responded to the following health history questions as indicated:    1. Are you having any GI issues? no 2. Do you have a personal history of Polyps? yes (about 5-6 years ago) 3. Do you have a family history of Colon Cancer or Polyps? no 4. Diabetes Mellitus? yes (taking Xigduo, Trulicity, insulin, actos) 5. Joint replacements in the past 12 months?no 6. Major health problems in the past 3 months?no 7. Any artificial heart valves, MVP, or defibrillator?yes (CAD)    MEDICATIONS & ALLERGIES:    Patient reports the following regarding taking any anticoagulation/antiplatelet therapy:   Plavix, Coumadin, Eliquis, Xarelto, Lovenox, Pradaxa, Brilinta, or Effient? yes (Plavix) Aspirin? no  Patient confirms/reports the following medications:  Current Outpatient Medications  Medication Sig Dispense Refill   atorvastatin (LIPITOR) 80 MG tablet Take 1 tablet (80 mg total) by mouth daily at 6 PM. 90 tablet 1   clopidogrel (PLAVIX) 75 MG tablet Take 1 tablet (75 mg total) by mouth daily. 90 tablet 1   Dapagliflozin Pro-metFORMIN ER (XIGDUO XR) 08-998 MG TB24 Take 2 tablets by mouth daily. 180 tablet 1   Dulaglutide (TRULICITY) 4.5 MG/0.5ML SOPN Inject 4.5 mg as directed once a week. 6 mL 0   ezetimibe (ZETIA) 10 MG tablet Take 1 tablet (10 mg total) by mouth daily. 90 tablet 1   famotidine (PEPCID) 40 MG tablet Take 1 tablet (40 mg total) by mouth at bedtime. 90 tablet 1   fluticasone (FLONASE) 50 MCG/ACT nasal spray Place 2 sprays into both nostrils as needed. 48 g 1   Glucagon (GVOKE HYPOPEN 1-PACK) 1 MG/0.2ML SOAJ Inject 1 each into the skin daily as needed. If glucose very low and unable to eat/drink 0.2 mL 1   insulin glargine (LANTUS SOLOSTAR) 100 UNIT/ML Solostar Pen Inject 50 Units into the skin daily. 45 mL 1   Insulin Pen Needle 32G X 6 MM MISC 1 each by  Does not apply route daily at 2 PM. 100 each 1   lisinopril (ZESTRIL) 2.5 MG tablet Take 1 tablet (2.5 mg total) by mouth daily. 90 tablet 1   loratadine (CLARITIN) 10 MG tablet Take 1 tablet by mouth daily.     metaxalone (SKELAXIN) 800 MG tablet Take 1 tablet (800 mg total) by mouth 3 (three) times daily. 90 tablet 1   metoprolol succinate (TOPROL-XL) 25 MG 24 hr tablet Take 0.5 tablets (12.5 mg total) by mouth daily. 45 tablet 1   omega-3 acid ethyl esters (LOVAZA) 1 g capsule Take 2 capsules (2 g total) by mouth 2 (two) times daily. 360 capsule 1   ONETOUCH ULTRA test strip USE AS DIRECTED (Patient not taking: Reported on 09/26/2022) 100 strip 11   pantoprazole (PROTONIX) 40 MG tablet Take 1 tablet (40 mg total) by mouth every morning. 90 tablet 1   pioglitazone (ACTOS) 15 MG tablet Take 1 tablet (15 mg total) by mouth daily. 90 tablet 1   pregabalin (LYRICA) 100 MG capsule Take 1 capsule (100 mg total) by mouth at bedtime. 90 capsule 1   sildenafil (VIAGRA) 100 MG tablet TAKE 1/2 TO 1 TABLET BY MOUTH DAILY AS NEEDED FOR ERECTILE DYSFUNCTION 30 tablet 0   No current facility-administered medications for this visit.    Patient confirms/reports the following allergies:  No Known Allergies  No orders of the defined types were placed in this encounter.   AUTHORIZATION INFORMATION Primary Insurance: 1D#:  Group #:  Secondary Insurance: 1D#: Group #:  SCHEDULE INFORMATION: Date: 12/05/2022 Time: Location: [facility]

## 2022-10-01 NOTE — Telephone Encounter (Signed)
Colonoscopy schedule on 12/05/2022 with Dr Servando Snare in [facility]

## 2022-10-01 NOTE — Telephone Encounter (Signed)
Pt returned call to schedule colonoscopy please return call 

## 2022-10-01 NOTE — Telephone Encounter (Signed)
   Pre-operative Risk Assessment    Patient Name: Jesse Lawrence.  DOB: [birth year] MRN: [id number]      Request for Surgical Clearance    Procedure:   colonscopy  Date of Surgery:  Clearance 12/05/22                                 Surgeon:  not indicated Surgeon's Group or Practice Name:  [facility] Phone number:  [phone] Fax number:  [phone]   Type of Clearance Requested:   - Pharmacy:  Hold Clopidogrel (Plavix) instrucations   Type of Anesthesia:  General    Additional requests/questions:    Burnett Sheng   10/01/2022, 1:22 PM

## 2022-10-01 NOTE — Telephone Encounter (Signed)
Hello Dr. Mariah Milling  We have received a surgical clearance request for Jesse Lawrence for colonoscopy.  He has a previous history of CAD with 2 stents placed in 2002-2003 with in-stent restenosis in 2007.  Information regarding his stent location and length of stents is not found in his chart. Can you please comment on guidance holding Plavix prior to his colonoscopy procedure.. Please forward you guidance and recommendations to P [facility] PREOP.  Thank you Robin Searing, NP

## 2022-10-02 NOTE — Telephone Encounter (Signed)
   Patient Name: Jesse Lawrence.  DOB: 05/13/1960 MRN: [id number]  Primary Cardiologist: Julien Nordmann, MD  Chart reviewed as part of pre-operative protocol coverage. Given past medical history and time since last visit, based on ACC/AHA guidelines, Jusitn Salsgiver. is at acceptable risk for the planned procedure without further cardiovascular testing.   Patient can hold Plavix 5 days prior to procedure however should continue aspirin 81 mg throughout the perioperative period.  The patient was advised that if he develops new symptoms prior to surgery to contact our office to arrange for a follow-up visit, and he verbalized understanding.  I will route this recommendation to the requesting party via Epic fax function and remove from pre-op pool.  Please call with questions.  Napoleon Form, Leodis Rains, NP 10/02/2022, 2:58 PM

## 2022-10-06 ENCOUNTER — Telehealth: Payer: Self-pay | Admitting: *Deleted

## 2022-10-06 NOTE — Telephone Encounter (Addendum)
Will call patient closer to procedure date.  Need to stop on Sunday 11/30/2022. Call patient on 11/28/2022.

## 2022-10-06 NOTE — Telephone Encounter (Signed)
Received procedure clearance on 10/02/2022 from Dr Windell Hummingbird office.  Gaston Islam., NP      10/02/22  2:59 PM Note     Patient Name: Jesse Lawrence.  DOB: 1961/01/22 MRN: [id number]   Primary Cardiologist: Julien Nordmann, MD   Chart reviewed as part of pre-operative protocol coverage. Given past medical history and time since last visit, based on ACC/AHA guidelines, Katherine Arias. is at acceptable risk for the planned procedure without further cardiovascular testing.    Patient can hold Plavix 5 days prior to procedure however should continue aspirin 81 mg throughout the perioperative period.   The patient was advised that if he develops new symptoms prior to surgery to contact our office to arrange for a follow-up visit, and he verbalized understanding.   I will route this recommendation to the requesting party via Epic fax function and remove from pre-op pool.   Please call with questions.   Napoleon Form, Leodis Rains, NP 10/02/2022, 2:58 PM

## 2022-10-07 ENCOUNTER — Other Ambulatory Visit: Payer: Self-pay | Admitting: Family Medicine

## 2022-10-07 ENCOUNTER — Encounter: Payer: Self-pay | Admitting: Internal Medicine

## 2022-10-07 ENCOUNTER — Ambulatory Visit (INDEPENDENT_AMBULATORY_CARE_PROVIDER_SITE_OTHER): Payer: 59 | Admitting: Internal Medicine

## 2022-10-07 ENCOUNTER — Other Ambulatory Visit: Payer: Self-pay

## 2022-10-07 VITALS — BP 126/68 | HR 78 | Resp 18 | Ht 68.0 in | Wt 220.0 lb

## 2022-10-07 DIAGNOSIS — K219 Gastro-esophageal reflux disease without esophagitis: Secondary | ICD-10-CM

## 2022-10-07 DIAGNOSIS — E1121 Type 2 diabetes mellitus with diabetic nephropathy: Secondary | ICD-10-CM

## 2022-10-07 DIAGNOSIS — Z794 Long term (current) use of insulin: Secondary | ICD-10-CM | POA: Diagnosis not present

## 2022-10-07 DIAGNOSIS — E1169 Type 2 diabetes mellitus with other specified complication: Secondary | ICD-10-CM

## 2022-10-07 DIAGNOSIS — E1165 Type 2 diabetes mellitus with hyperglycemia: Secondary | ICD-10-CM

## 2022-10-07 LAB — GLUCOSE, POCT (MANUAL RESULT ENTRY): POC Glucose: 265 mg/dl — AB (ref 70–99)

## 2022-10-07 MED ORDER — TRULICITY 1.5 MG/0.5ML ~~LOC~~ SOAJ
1.5000 mg | SUBCUTANEOUS | 0 refills | Status: DC
Start: 2022-10-07 — End: 2023-01-16

## 2022-10-07 MED ORDER — NOVOLOG FLEXPEN 100 UNIT/ML ~~LOC~~ SOPN
4.0000 [IU] | PEN_INJECTOR | Freq: Three times a day (TID) | SUBCUTANEOUS | 11 refills | Status: DC
Start: 2022-10-07 — End: 2022-10-28

## 2022-10-07 MED ORDER — INSULIN PEN NEEDLE 32G X 6 MM MISC: 1.0000 | Freq: Every day | 1 refills | Status: DC

## 2022-10-07 NOTE — Patient Instructions (Signed)
It was great seeing you today!  Plan discussed at today's visit: -Trulicity on back order, will prescribe 1.5 mg weekly -Continue Lantus 50 units daily -Use Novolog (short acting insulin) with meals according to blood sugar levels:  60-149 mg/dL = No Insulin 604-540 mg/dL = 4 unit 981-191 mg/dL = 6 units 478-295 mg/dL = 8 units 621-308 mg/dL = 10 units > 657 mg/dL = 12 units & call physician  Follow up in: 3 months or sooner as needed  Take care and let us know if you have any questions or concerns prior to your next visit.  Dr. Caralee Ates

## 2022-10-07 NOTE — Progress Notes (Signed)
Acute Office Visit  Subjective:     Patient ID: Jesse Englin., male    DOB: 17-Nov-1960, 62 y.o.   MRN: [id number]  Chief Complaint  Patient presents with   Diabetes    Out of insulin    Diabetes Pertinent negatives for diabetes include no blurred vision and no chest pain.   Patient is in today for hyperglycemia. Last A1c 6/24 7.8% but patient states he has not been able to get his Trulicity for 6 weeks. He was prescribed 4.5 mg. He is also on Lantus 50 units in the morning, Dapaglifozin-Metformin 08-998 mg BID and Actos 15 mg. He is taking his other medications as prescribed. Patient states last night he felt generally unwell and had blurred vision, checked his sugar and it was 470. Patient has not been eating as he should, ate pasta for lunch today.   Review of Systems  Constitutional:  Negative for chills and fever.  Eyes:  Negative for blurred vision.  Respiratory:  Negative for shortness of breath.   Cardiovascular:  Negative for chest pain.        Objective:    BP 126/68   Pulse 78   Resp 18   Ht 5\' 8"  (1.727 m)   Wt 220 lb (99.8 kg)   SpO2 98%   BMI 33.45 kg/m  BP Readings from Last 3 Encounters:  10/07/22 126/68  09/29/22 126/68  09/26/22 110/62   Wt Readings from Last 3 Encounters:  10/07/22 220 lb (99.8 kg)  09/29/22 220 lb (99.8 kg)  09/26/22 220 lb (99.8 kg)      Physical Exam Constitutional:      Appearance: Normal appearance.  HENT:     Head: Normocephalic and atraumatic.  Eyes:     Conjunctiva/sclera: Conjunctivae normal.  Cardiovascular:     Rate and Rhythm: Normal rate and regular rhythm.  Pulmonary:     Effort: Pulmonary effort is normal.     Breath sounds: Normal breath sounds.  Skin:    General: Skin is warm and dry.  Neurological:     General: No focal deficit present.     Mental Status: He is alert. Mental status is at baseline.  Psychiatric:        Mood and Affect: Mood normal.        Behavior: Behavior normal.      Results for orders placed or performed in visit on 10/07/22  POCT Glucose (CBG)  Result Value Ref Range   POC Glucose 265 (A) 70 - 99 mg/dl        Assessment & Plan:   1. Type 2 diabetes mellitus with hyperglycemia, with long-term current use of insulin (HCC): Glucose 265 here in the office today after eating pasta. Called pharmacy, they are out of higher doses of Trulicity. Will prescribe 1.5 mg dose for now, start on short acting insulin at meals, sliding scale provided for the patient. Continue other diabetic medications. Longer needles refilled, patient has scarring from long time use of insulin. Follow up in 3 months or sooner as needed.   - POCT Glucose (CBG) - Insulin Pen Needle 32G X 6 MM MISC; 1 each by Does not apply route daily at 2 PM.  Dispense: 100 each; Refill: 1 - Dulaglutide (TRULICITY) 1.5 MG/0.5ML SOPN; Inject 1.5 mg into the skin once a week.  Dispense: 6 mL; Refill: 0 - insulin aspart (NOVOLOG FLEXPEN) 100 UNIT/ML FlexPen; Inject 4-12 Units into the skin 3 (three) times daily with meals. Per  sliding scale  Dispense: 15 mL; Refill: 11   Return for already scheduled .  Margarita Mail, DO

## 2022-10-10 ENCOUNTER — Other Ambulatory Visit: Payer: Self-pay | Admitting: Cardiovascular Disease

## 2022-10-10 DIAGNOSIS — E1169 Type 2 diabetes mellitus with other specified complication: Secondary | ICD-10-CM

## 2022-10-17 NOTE — Telephone Encounter (Signed)
Copied from CRM [id number]. Topic: General - Other >> Oct 17, 2022  3:26 PM Franchot Heidelberg wrote: Reason for CRM: Pt is requesting a call back from [place], regarding an injection  Best contact: [phone]

## 2022-10-23 ENCOUNTER — Telehealth: Payer: Self-pay | Admitting: Family Medicine

## 2022-10-23 DIAGNOSIS — E1165 Type 2 diabetes mellitus with hyperglycemia: Secondary | ICD-10-CM

## 2022-10-23 NOTE — Telephone Encounter (Signed)
Patient stated his insurance does not want patient to have insulin aspart (NOVOLOG FLEXPEN) 100 UNIT/ML FlexPen they are asking for another medication in the place of. Please contact his insurance at [phone] for more info.

## 2022-10-24 NOTE — Telephone Encounter (Signed)
Patient advised to contact insurance and ask what they cover that is affordable to him. I tried to reach out to them but they would not disclose the information to me.

## 2022-10-24 NOTE — Telephone Encounter (Signed)
Patient is requesting alternative medication, insurance will not cover, Requested Prescriptions   Pending Prescriptions Disp Refills   insulin aspart (NOVOLOG FLEXPEN) 100 UNIT/ML FlexPen 15 mL 11    Sig: Inject 4-12 Units into the skin 3 (three) times daily with meals. Per sliding scale

## 2022-10-26 ENCOUNTER — Other Ambulatory Visit: Payer: Self-pay | Admitting: Family Medicine

## 2022-10-26 DIAGNOSIS — N529 Male erectile dysfunction, unspecified: Secondary | ICD-10-CM

## 2022-10-28 ENCOUNTER — Other Ambulatory Visit: Payer: Self-pay | Admitting: Family Medicine

## 2022-10-28 ENCOUNTER — Telehealth: Payer: Self-pay | Admitting: Family Medicine

## 2022-10-28 MED ORDER — INSULIN LISPRO (1 UNIT DIAL) 100 UNIT/ML (KWIKPEN): 4.1200 [IU] | PEN_INJECTOR | Freq: Three times a day (TID) | SUBCUTANEOUS | 2 refills | Status: DC

## 2022-10-28 NOTE — Telephone Encounter (Signed)
Pt called in about status of  injection med, I gave him the notes in system. He says he will call back the insurance company

## 2022-10-28 NOTE — Telephone Encounter (Signed)
Pt states he spoke with his insurance company and for his medication: insulin aspart (NOVOLOG FLEXPEN) 100 UNIT/ML FlexPen They are needing a prior authorization to fill this prescription. The insurance would rather pt take Humalog. Per pt his insurance states that this is the preferred medication and it saves the pt $20. But if pt PCP want him to stay on the Novolog please send in the Prior Authorization.     [id number] - [place], [place] - [address] AT [facility] OF [address] Phone: [phone]  Fax: [phone]

## 2022-11-04 ENCOUNTER — Other Ambulatory Visit: Payer: Self-pay | Admitting: Family Medicine

## 2022-11-05 ENCOUNTER — Other Ambulatory Visit: Payer: Self-pay | Admitting: Orthopedic Surgery

## 2022-11-05 DIAGNOSIS — M25562 Pain in left knee: Secondary | ICD-10-CM

## 2022-11-13 ENCOUNTER — Encounter: Payer: Self-pay | Admitting: Orthopedic Surgery

## 2022-11-20 ENCOUNTER — Ambulatory Visit
Admission: RE | Admit: 2022-11-20 | Discharge: 2022-11-20 | Disposition: A | Discharge status: Home / Self Care | Payer: 59 | Source: Ambulatory Visit | Attending: Orthopedic Surgery | Admitting: Orthopedic Surgery

## 2022-11-20 DIAGNOSIS — M25562 Pain in left knee: Secondary | ICD-10-CM

## 2022-11-20 MED ORDER — DAPAGLIFLOZIN PRO-METFORMIN ER 5-1000 MG PO TB24: 2.0000 | ORAL_TABLET | Freq: Every day | ORAL | 1 refills | Status: DC

## 2022-11-27 ENCOUNTER — Encounter: Payer: Self-pay | Admitting: Gastroenterology

## 2022-11-28 ENCOUNTER — Encounter: Payer: Self-pay | Admitting: Gastroenterology

## 2022-11-28 NOTE — Anesthesia Preprocedure Evaluation (Addendum)
Anesthesia Evaluation  Patient identified by MRN, date of birth, ID band Patient awake    Reviewed: Allergy & Precautions, H&P , NPO status , Patient's Chart, lab work & pertinent test results  Airway Mallampati: III  TM Distance: >3 FB Neck ROM: Full    Dental no notable dental hx. (+) Chipped   Pulmonary sleep apnea , pneumonia, COPD, former smoker COPD "long smoking history" stopped 6 years ago   Pulmonary exam normal breath sounds clear to auscultation       Cardiovascular hypertension, + angina  + CAD and + Past MI  Normal cardiovascular exam Rhythm:Regular Rate:Normal  2021 doctor office note: history of CAD, stent x 2   2002 and 2003,  ISR in 2007, TPA (cath 3 days later) I cannot find echo nor heart cath report HTN  Recent cardiac visit 09-26-22, recommended one year followup.    Neuro/Psych  Headaches PSYCHIATRIC DISORDERS Anxiety        GI/Hepatic ,GERD  ,,(+) Hepatitis -  Endo/Other  diabetes    Renal/GU Renal disease  negative genitourinary   Musculoskeletal negative musculoskeletal ROS (+)    Abdominal   Peds negative pediatric ROS (+)  Hematology negative hematology ROS (+)   Anesthesia Other Findings Hyperlipidemia Allergy  COPD (chronic obstructive pulmonary disease) (HCC) Diabetes mellitus  Anxiety Obesity  Hypertension MI (myocardial infarction) (HCC)  PNA (pneumonia) Coronary artery disease  Sleep apnea Migraine headache  Diabetic nephropathy Sleep apnea, can't tolerate CPAP, discussed risks of untreated sleep apnea, discussed possible Inspire treatment,    Reproductive/Obstetrics negative OB ROS                             Anesthesia Physical Anesthesia Plan  ASA: 3  Anesthesia Plan: General   Post-op Pain Management:    Induction: Intravenous  PONV Risk Score and Plan:   Airway Management Planned: Natural Airway and Nasal Cannula  Additional  Equipment:   Intra-op Plan:   Post-operative Plan:   Informed Consent: I have reviewed the patients History and Physical, chart, labs and discussed the procedure including the risks, benefits and alternatives for the proposed anesthesia with the patient or authorized representative who has indicated his/her understanding and acceptance.     Dental Advisory Given  Plan Discussed with: Anesthesiologist, CRNA and Surgeon  Anesthesia Plan Comments: (Patient consented for risks of anesthesia including but not limited to:  - adverse reactions to medications - risk of airway placement if required - damage to eyes, teeth, lips or other oral mucosa - nerve damage due to positioning  - sore throat or hoarseness - Damage to heart, brain, nerves, lungs, other parts of body or loss of life  Patient voiced understanding.)       Anesthesia Quick Evaluation

## 2022-11-28 NOTE — Telephone Encounter (Signed)
Spoken to patient and inform him the instructions regarding Plavix.  Patient verbalized understanding.

## 2022-12-01 ENCOUNTER — Other Ambulatory Visit: Payer: Self-pay | Admitting: Family Medicine

## 2022-12-01 DIAGNOSIS — N529 Male erectile dysfunction, unspecified: Secondary | ICD-10-CM

## 2022-12-02 ENCOUNTER — Encounter: Payer: Self-pay | Admitting: Gastroenterology

## 2022-12-05 ENCOUNTER — Encounter
Admission: RE | Disposition: A | Discharge status: Home / Self Care | Payer: Self-pay | Source: Home / Self Care | Attending: Gastroenterology

## 2022-12-05 ENCOUNTER — Ambulatory Visit: Payer: 59 | Admitting: Anesthesiology

## 2022-12-05 ENCOUNTER — Ambulatory Visit
Admission: RE | Admit: 2022-12-05 | Discharge: 2022-12-05 | Disposition: A | Discharge status: Home / Self Care | Payer: 59 | Attending: Gastroenterology | Admitting: Gastroenterology

## 2022-12-05 ENCOUNTER — Other Ambulatory Visit: Payer: Self-pay

## 2022-12-05 ENCOUNTER — Encounter: Payer: Self-pay | Admitting: Gastroenterology

## 2022-12-05 DIAGNOSIS — I25119 Atherosclerotic heart disease of native coronary artery with unspecified angina pectoris: Secondary | ICD-10-CM | POA: Insufficient documentation

## 2022-12-05 DIAGNOSIS — J449 Chronic obstructive pulmonary disease, unspecified: Secondary | ICD-10-CM | POA: Diagnosis not present

## 2022-12-05 DIAGNOSIS — Z7984 Long term (current) use of oral hypoglycemic drugs: Secondary | ICD-10-CM | POA: Insufficient documentation

## 2022-12-05 DIAGNOSIS — Z7985 Long-term (current) use of injectable non-insulin antidiabetic drugs: Secondary | ICD-10-CM | POA: Insufficient documentation

## 2022-12-05 DIAGNOSIS — Z955 Presence of coronary angioplasty implant and graft: Secondary | ICD-10-CM | POA: Diagnosis not present

## 2022-12-05 DIAGNOSIS — I10 Essential (primary) hypertension: Secondary | ICD-10-CM | POA: Insufficient documentation

## 2022-12-05 DIAGNOSIS — Z794 Long term (current) use of insulin: Secondary | ICD-10-CM | POA: Diagnosis not present

## 2022-12-05 DIAGNOSIS — I252 Old myocardial infarction: Secondary | ICD-10-CM | POA: Diagnosis not present

## 2022-12-05 DIAGNOSIS — D125 Benign neoplasm of sigmoid colon: Secondary | ICD-10-CM | POA: Diagnosis not present

## 2022-12-05 DIAGNOSIS — Z860100 Personal history of colon polyps, unspecified: Secondary | ICD-10-CM

## 2022-12-05 DIAGNOSIS — G4730 Sleep apnea, unspecified: Secondary | ICD-10-CM | POA: Diagnosis not present

## 2022-12-05 DIAGNOSIS — K635 Polyp of colon: Secondary | ICD-10-CM | POA: Diagnosis not present

## 2022-12-05 DIAGNOSIS — Z1211 Encounter for screening for malignant neoplasm of colon: Secondary | ICD-10-CM | POA: Insufficient documentation

## 2022-12-05 DIAGNOSIS — E1121 Type 2 diabetes mellitus with diabetic nephropathy: Secondary | ICD-10-CM | POA: Insufficient documentation

## 2022-12-05 DIAGNOSIS — Z87891 Personal history of nicotine dependence: Secondary | ICD-10-CM | POA: Diagnosis not present

## 2022-12-05 DIAGNOSIS — K640 First degree hemorrhoids: Secondary | ICD-10-CM | POA: Diagnosis not present

## 2022-12-05 DIAGNOSIS — K219 Gastro-esophageal reflux disease without esophagitis: Secondary | ICD-10-CM | POA: Insufficient documentation

## 2022-12-05 HISTORY — PX: POLYPECTOMY: SHX5525

## 2022-12-05 HISTORY — DX: Migraine, unspecified, not intractable, without status migrainosus: G43.909

## 2022-12-05 HISTORY — DX: Long term (current) use of insulin: Z79.4

## 2022-12-05 HISTORY — DX: Type 2 diabetes mellitus with diabetic nephropathy: E11.21

## 2022-12-05 HISTORY — DX: Sleep apnea, unspecified: G47.30

## 2022-12-05 HISTORY — PX: COLONOSCOPY WITH PROPOFOL: SHX5780

## 2022-12-05 LAB — GLUCOSE, CAPILLARY: Glucose-Capillary: 159 mg/dL — ABNORMAL HIGH (ref 70–99)

## 2022-12-05 SURGERY — COLONOSCOPY WITH PROPOFOL
Anesthesia: General | Site: Rectum

## 2022-12-05 MED ORDER — SODIUM CHLORIDE 0.9 % IV SOLN: INTRAVENOUS | Status: DC

## 2022-12-05 MED ORDER — PROPOFOL 10 MG/ML IV BOLUS
INTRAVENOUS | Status: DC | PRN
  Administered 2022-12-05: 50 mg via INTRAVENOUS
  Administered 2022-12-05: 20 mg via INTRAVENOUS
  Administered 2022-12-05: 100 mg via INTRAVENOUS
  Administered 2022-12-05: 20 mg via INTRAVENOUS
  Administered 2022-12-05: 30 mg via INTRAVENOUS

## 2022-12-05 MED ORDER — LIDOCAINE HCL (CARDIAC) PF 100 MG/5ML IV SOSY
PREFILLED_SYRINGE | INTRAVENOUS | Status: DC | PRN
  Administered 2022-12-05: 50 mg via INTRAVENOUS

## 2022-12-05 MED ORDER — LACTATED RINGERS IV SOLN: INTRAVENOUS | Status: DC

## 2022-12-05 MED ORDER — STERILE WATER FOR IRRIGATION IR SOLN
Status: DC | PRN
  Administered 2022-12-05: .05 mL

## 2022-12-05 MED ORDER — STERILE WATER FOR IRRIGATION IR SOLN
Status: DC | PRN
  Administered 2022-12-05: 50 mL

## 2022-12-05 SURGICAL SUPPLY — 8 items
GOWN CVR UNV OPN BCK APRN NK (MISCELLANEOUS) ×4 IMPLANT
GOWN ISOL THUMB LOOP REG UNIV (MISCELLANEOUS) ×4
KIT PRC NS LF DISP ENDO (KITS) ×2 IMPLANT
KIT PROCEDURE OLYMPUS (KITS) ×2
MANIFOLD NEPTUNE II (INSTRUMENTS) ×2 IMPLANT
SNARE COLD EXACTO (MISCELLANEOUS) IMPLANT
TRAP ETRAP POLY (MISCELLANEOUS) IMPLANT
WATER STERILE IRR 250ML POUR (IV SOLUTION) ×2 IMPLANT

## 2022-12-05 NOTE — Transfer of Care (Signed)
Immediate Anesthesia Transfer of Care Note  Patient: Jesse Lawrence.  Procedure(s) Performed: COLONOSCOPY WITH PROPOFOL (Rectum) POLYPECTOMY (Rectum)  Patient Location: PACU  Anesthesia Type: General  Level of Consciousness: awake, alert  and patient cooperative  Airway and Oxygen Therapy: Patient Spontanous Breathing and Patient connected to supplemental oxygen  Post-op Assessment: Post-op Vital signs reviewed, Patient's Cardiovascular Status Stable, Respiratory Function Stable, Patent Airway and No signs of Nausea or vomiting  Post-op Vital Signs: Reviewed and stable  Complications: No notable events documented.

## 2022-12-05 NOTE — Anesthesia Postprocedure Evaluation (Signed)
Anesthesia Post Note  Patient: Razeen Laudadio.  Procedure(s) Performed: COLONOSCOPY WITH PROPOFOL (Rectum) POLYPECTOMY (Rectum)  Patient location during evaluation: PACU Anesthesia Type: General Level of consciousness: awake and alert Pain management: pain level controlled Vital Signs Assessment: post-procedure vital signs reviewed and stable Respiratory status: spontaneous breathing, nonlabored ventilation, respiratory function stable and patient connected to nasal cannula oxygen Cardiovascular status: blood pressure returned to baseline and stable Postop Assessment: no apparent nausea or vomiting Anesthetic complications: no   No notable events documented.   Last Vitals:  Vitals:   12/05/22 0646  BP: (!) 143/78  Pulse: 77  Resp: 17  Temp: 36.7 C  SpO2: 95%    Last Pain:  Vitals:   12/05/22 0646  TempSrc: Temporal  PainSc: 0-No pain                 Anikka Marsan C Stephonie Wilcoxen

## 2022-12-05 NOTE — H&P (Signed)
Midge Minium, MD [facility] [address], [address] [place], [place] [address] Phone:[phone] Fax : [phone]  Primary Care Physician:  Alba Cory, MD Primary Gastroenterologist:  Dr. Servando Snare  Pre-Procedure History & Physical: HPI:  Jesse Lawrence. is a 62 y.o. male is here for an colonoscopy.   Past Medical History:  Diagnosis Date   Allergy    Anxiety    COPD (chronic obstructive pulmonary disease) (HCC)    Coronary artery disease    Diabetes mellitus without complication (HCC)    Diabetic nephropathy (HCC)    Hyperlipidemia    Hypertension    Male impotence    MI (myocardial infarction) (HCC) 2007   Migraine headache    Obesity    PNA (pneumonia)    Sleep apnea    Has CPAP, hasn't used > 5 yrs   Type 2 diabetes mellitus with diabetic nephropathy (HCC)     Past Surgical History:  Procedure Laterality Date   cadiac stenting     CARDIAC CATHETERIZATION     Stents 2003 & 2004   KNEE ARTHROSCOPY  1996   PILONIDAL CYST EXCISION  1983    Prior to Admission medications   Medication Sig Start Date End Date Taking? Authorizing Provider  atorvastatin (LIPITOR) 80 MG tablet Take 1 tablet (80 mg total) by mouth daily at 6 PM. 09/29/22  Yes Sowles, Danna Hefty, MD  clopidogrel (PLAVIX) 75 MG tablet Take 1 tablet (75 mg total) by mouth daily. 09/29/22  Yes Sowles, Danna Hefty, MD  Dapagliflozin Pro-metFORMIN ER (XIGDUO XR) 08-998 MG TB24 Take 2 tablets by mouth daily. 11/20/22  Yes Sowles, Danna Hefty, MD  Dulaglutide (TRULICITY) 1.5 MG/0.5ML SOPN Inject 1.5 mg into the skin once a week. 10/07/22  Yes Margarita Mail, DO  ezetimibe (ZETIA) 10 MG tablet Take 1 tablet (10 mg total) by mouth daily. 09/29/22  Yes Sowles, Danna Hefty, MD  famotidine (PEPCID) 40 MG tablet Take 1 tablet (40 mg total) by mouth at bedtime. 09/29/22  Yes Sowles, Danna Hefty, MD  fluticasone (FLONASE) 50 MCG/ACT nasal spray Place 2 sprays into both nostrils as needed. 09/29/22  Yes Sowles, Danna Hefty, MD  Glucagon (GVOKE  HYPOPEN 1-PACK) 1 MG/0.2ML SOAJ Inject 1 each into the skin daily as needed. If glucose very low and unable to eat/drink 06/26/20  Yes Sowles, Danna Hefty, MD  insulin glargine (LANTUS SOLOSTAR) 100 UNIT/ML Solostar Pen Inject 50 Units into the skin daily. 09/29/22  Yes Sowles, Danna Hefty, MD  insulin lispro (HUMALOG KWIKPEN) 100 UNIT/ML KwikPen Inject 4 Units into the skin 3 (three) times daily. 10/28/22  Yes Sowles, Danna Hefty, MD  lisinopril (ZESTRIL) 2.5 MG tablet Take 1 tablet (2.5 mg total) by mouth daily. 09/29/22  Yes Sowles, Danna Hefty, MD  loratadine (CLARITIN) 10 MG tablet Take 1 tablet by mouth daily. 08/09/12  Yes [provider]  metaxalone (SKELAXIN) 800 MG tablet Take 1 tablet (800 mg total) by mouth 3 (three) times daily. 09/29/22  Yes Sowles, Danna Hefty, MD  metoprolol succinate (TOPROL-XL) 25 MG 24 hr tablet Take 0.5 tablets (12.5 mg total) by mouth daily. 09/29/22  Yes Sowles, Danna Hefty, MD  omega-3 acid ethyl esters (LOVAZA) 1 g capsule Take 2 capsules (2 g total) by mouth 2 (two) times daily. 09/29/22  Yes Sowles, Danna Hefty, MD  pantoprazole (PROTONIX) 40 MG tablet Take 1 tablet (40 mg total) by mouth every morning. 09/29/22  Yes Sowles, Danna Hefty, MD  pioglitazone (ACTOS) 15 MG tablet Take 1 tablet (15 mg total) by mouth daily. 09/29/22  Yes Alba Cory, MD  pregabalin (LYRICA) 100 MG capsule Take 1 capsule (100 mg total) by mouth at bedtime. 09/29/22  Yes Sowles, Danna Hefty, MD  Insulin Pen Needle 32G X 6 MM MISC 1 each by Does not apply route daily at 2 PM. 10/07/22   Margarita Mail, DO  [facility] ULTRA test strip USE AS DIRECTED 04/29/22   Alba Cory, MD  sildenafil (VIAGRA) 100 MG tablet TAKE 1/2 TO 1 TABLET BY MOUTH DAILY AS NEEDED FOR ERECTILE DYSFUNCTION 12/01/22   Alba Cory, MD    Allergies as of 10/01/2022   (No Known Allergies)    Family History  Problem Relation Age of Onset   Diabetes Mother    CAD Mother    Heart disease Mother    Heart attack Mother    Heart disease  Father    Heart attack Father     Social History   Socioeconomic History   Marital status: Married    Spouse name: Renee   Number of children: 4   Years of education: Not on file   Highest education level: Not on file  Occupational History   Not on file  Tobacco Use   Smoking status: Former    Current packs/day: 0.00    Average packs/day: 1.5 packs/day for 30.0 years (45.0 ttl pk-yrs)    Types: Cigarettes    Start date: 04/20/1983    Quit date: 04/19/2013    Years since quitting: 9.6    Passive exposure: Current   Smokeless tobacco: Current    Types: Snuff  Vaping Use   Vaping status: Former  Substance and Sexual Activity   Alcohol use: No    Alcohol/week: 0.0 standard drinks of alcohol    Comment: occ   Drug use: No   Sexual activity: Yes    Partners: Female  Other Topics Concern   Not on file  Social History Narrative   Not on file   Social Determinants of Health   Financial Resource Strain: Low Risk  (02/26/2018)   Overall Financial Resource Strain (CARDIA)    Difficulty of Paying Living Expenses: Not hard at all  Food Insecurity: No Food Insecurity (02/26/2018)   Hunger Vital Sign    Worried About Running Out of Food in the Last Year: Never true    Ran Out of Food in the Last Year: Never true  Transportation Needs: No Transportation Needs (02/26/2018)   PRAPARE - Administrator, Civil Service (Medical): No    Lack of Transportation (Non-Medical): No  Physical Activity: Inactive (02/26/2018)   Exercise Vital Sign    Days of Exercise per Week: 0 days    Minutes of Exercise per Session: 0 min  Stress: No Stress Concern Present (02/26/2018)   [employer] of Occupational Health - Occupational Stress Questionnaire    Feeling of Stress : Not at all  Social Connections: Socially Integrated (02/26/2018)   Social Connection and Isolation Panel [NHANES]    Frequency of Communication with Friends and Family: More than three times a week     Frequency of Social Gatherings with Friends and Family: More than three times a week    Attends Religious Services: More than 4 times per year    Active Member of [employer] or Organizations: Yes    Attends Banker Meetings: More than 4 times per year    Marital Status: Married  Catering manager Violence: Not At Risk (02/26/2018)   Humiliation, Afraid, Rape, and Kick questionnaire    Fear of Current or Ex-Partner:  No    Emotionally Abused: No    Physically Abused: No    Sexually Abused: No    Review of Systems: See HPI, otherwise negative ROS  Physical Exam: BP (!) 143/78   Pulse 77   Temp 98.1 F (36.7 C) (Temporal)   Resp 17   Ht 5\' 8"  (1.727 m)   Wt 100 kg   SpO2 95%   BMI 33.53 kg/m  General:   Alert,  pleasant and cooperative in NAD Head:  Normocephalic and atraumatic. Neck:  Supple; no masses or thyromegaly. Lungs:  Clear throughout to auscultation.    Heart:  Regular rate and rhythm. Abdomen:  Soft, nontender and nondistended. Normal bowel sounds, without guarding, and without rebound.   Neurologic:  Alert and  oriented x4;  grossly normal neurologically.  Impression/Plan: Dimas Chyle. is here for an colonoscopy to be performed for a history of adenomatous polyps on 2014   Risks, benefits, limitations, and alternatives regarding  colonoscopy have been reviewed with the patient.  Questions have been answered.  All parties agreeable.   Midge Minium, MD  12/05/2022, 7:02 AM

## 2022-12-05 NOTE — Op Note (Signed)
[facility] Patient Name: Jesse Lawrence Procedure Date: 12/05/2022 7:14 AM MRN: [id number] Account #: [id number] Date of Birth: [birth year] Admit Type: Outpatient Age: 62 Room: [facility] OR ROOM 01 Gender: Male Note Status: Finalized Instrument Name: [id number] Procedure:             Colonoscopy Indications:           High risk colon cancer surveillance: Personal history                         of colonic polyps Providers:             Midge Minium MD, MD Referring MD:          Onnie Boer. Sowles, MD (Referring MD) Medicines:             Propofol per Anesthesia Complications:         No immediate complications. Procedure:             Pre-Anesthesia Assessment:                        - Prior to the procedure, a History and Physical was                         performed, and patient medications and allergies were                         reviewed. The patient's tolerance of previous                         anesthesia was also reviewed. The risks and benefits                         of the procedure and the sedation options and risks                         were discussed with the patient. All questions were                         answered, and informed consent was obtained. Prior                         Anticoagulants: The patient has taken no anticoagulant                         or antiplatelet agents. ASA Grade Assessment: II - A                         patient with mild systemic disease. After reviewing                         the risks and benefits, the patient was deemed in                         satisfactory condition to undergo the procedure.                        After obtaining informed consent, the colonoscope was  passed under direct vision. Throughout the procedure,                         the patient's blood pressure, pulse, and oxygen                         saturations were monitored continuously. The                          Colonoscope was introduced through the anus and                         advanced to the the cecum, identified by appendiceal                         orifice and ileocecal valve. The colonoscopy was                         performed without difficulty. The patient tolerated                         the procedure well. The quality of the bowel                         preparation was excellent. Findings:      The perianal and digital rectal examinations were normal.      Two sessile polyps were found in the sigmoid colon. The polyps were 3 to       4 mm in size. These polyps were removed with a cold snare. Resection and       retrieval were complete.      Non-bleeding internal hemorrhoids were found during retroflexion. The       hemorrhoids were Grade I (internal hemorrhoids that do not prolapse). Impression:            - Two 3 to 4 mm polyps in the sigmoid colon, removed                         with a cold snare. Resected and retrieved.                        - Non-bleeding internal hemorrhoids. Recommendation:        - Discharge patient to home.                        - Resume previous diet.                        - Continue present medications.                        - Await pathology results.                        - Repeat colonoscopy in 7 years for surveillance. Procedure Code(s):     --- Professional ---                        [phone], Colonoscopy, flexible; with removal of  tumor(s), polyp(s), or other lesion(s) by snare                         technique Diagnosis Code(s):     --- Professional ---                        Z86.010, Personal history of colonic polyps                        D12.5, Benign neoplasm of sigmoid colon CPT copyright 2022 American Medical Association. All rights reserved. The codes documented in this report are preliminary and upon coder review may  be revised to meet current compliance requirements. Midge Minium MD, MD 12/05/2022  7:51:58 AM This report has been signed electronically. Number of Addenda: 0 Note Initiated On: 12/05/2022 7:14 AM Scope Withdrawal Time: 0 hours 11 minutes 30 seconds  Total Procedure Duration: 0 hours 15 minutes 31 seconds  Estimated Blood Loss:  Estimated blood loss: none.      [facility]

## 2022-12-06 ENCOUNTER — Encounter: Payer: Self-pay | Admitting: Gastroenterology

## 2022-12-09 ENCOUNTER — Encounter: Payer: Self-pay | Admitting: Gastroenterology

## 2022-12-22 ENCOUNTER — Other Ambulatory Visit: Payer: Self-pay | Admitting: Family Medicine

## 2022-12-22 DIAGNOSIS — E1169 Type 2 diabetes mellitus with other specified complication: Secondary | ICD-10-CM

## 2022-12-22 DIAGNOSIS — K7581 Nonalcoholic steatohepatitis (NASH): Secondary | ICD-10-CM

## 2023-01-01 ENCOUNTER — Other Ambulatory Visit: Payer: Self-pay | Admitting: Family Medicine

## 2023-01-01 DIAGNOSIS — N529 Male erectile dysfunction, unspecified: Secondary | ICD-10-CM

## 2023-01-15 NOTE — Progress Notes (Signed)
Name: Jesse Lawrence.   MRN: [id number]    DOB: 1961-02-19   Date:01/16/2023       Progress Note  Subjective  Chief Complaint  Follow Up  HPI  DM II :  His A1C was at goal for a long time, however had a gap of insurance and medication, A1C was up to 10.7 % it improved but is trending up again  He is currently taking Lantus 50 units, Trulicity 4.5 mg ( but was previoulsy on 1.5 due to a shortage )  Xigduo max dose per day - however out of medication for months due to not being on preferred medication list. We will switch to Metformin and Jardiance, , A1C had gone from 7.7 % to 8.7 % down 8.2 %  7.8 % and today spiked to 11.3 % . We will refer him to Endo. Discussed importance of yearly eye exam, diabetic diet and taking medications as prescribed  HTN/CAD with stable angina : no palpitation, angina has been  controlled with medication, on low dose ACE and half pill metoprolol , he has been taking Atorvastatin, Zetia  and Lovaza. He sees cardiologist - Dr. Mariah Milling .    ED: he has difficulty maintaining and erection has been doing well on Viagra   OSA: he has not been compliant with CPAP machine, he cannot tolerated it, he is aware of the importance of wearing it . Unchnaged   GERD: he takes pantoprazole in am and pepcid prn ,and doing well. He only has symptoms when he eats spicy foods Stable  Obesity: weight is up 10 lbs, he is now taking Trulicity 4.5 mg for the past couple of weeks.  Radiculitis:  he noticed left lower back pain back with radiculitis down right leg back in June 2023 , we gave him meloxicam, skelaxin and he tried elavil ( he had for shingles) but pain did not improve and progressed to pain radiating  down both legs ( intermittently, he states a lot of times pain radiates to his groin - worse when he was getting in and out of his truck .He states symptoms are better now, he is taking medication prn   NASH : he is on Actos and Metformin, needs to follow up with Dr. Servando Snare -  unchanged   Patient Active Problem List   Diagnosis Date Noted   History of colonic polyps 12/05/2022   Polyp of sigmoid colon 12/05/2022   Coronary artery disease of native artery of native heart with stable angina pectoris (HCC) 11/26/2018   OSA (obstructive sleep apnea) 05/21/2015   Perennial allergic rhinitis 11/07/2014   Arteriosclerosis of coronary artery 11/07/2014   CD (contact dermatitis) 11/07/2014   Decreased libido 11/07/2014   Diabetes mellitus with renal manifestation (HCC) 11/07/2014   Gastro-esophageal reflux disease without esophagitis 11/07/2014   H/O acute myocardial infarction 11/07/2014   Hypercholesteremia 11/07/2014   Benign hypertension 11/07/2014   Hypertriglyceridemia 11/07/2014   H/O high risk medication treatment 11/07/2014   NASH (nonalcoholic steatohepatitis) 11/07/2014   Microalbuminuria 11/07/2014   Adult BMI 30+ 11/07/2014   Fungal infection of toenail 11/07/2014   Tobacco abuse 11/07/2014   ED (erectile dysfunction) of organic origin 09/29/2006    Past Surgical History:  Procedure Laterality Date   cadiac stenting     CARDIAC CATHETERIZATION     Stents 2003 & 2004   COLONOSCOPY WITH PROPOFOL N/A 12/05/2022   Procedure: COLONOSCOPY WITH PROPOFOL;  Surgeon: Midge Minium, MD;  Location: [facility];  Service:  Endoscopy;  Laterality: N/A;  Diabetic   KNEE ARTHROSCOPY  1996   PILONIDAL CYST EXCISION  1983   POLYPECTOMY  12/05/2022   Procedure: POLYPECTOMY;  Surgeon: Midge Minium, MD;  Location: [facility];  Service: Endoscopy;;    Family History  Problem Relation Age of Onset   Diabetes Mother    CAD Mother    Heart disease Mother    Heart attack Mother    Heart disease Father    Heart attack Father     Social History   Tobacco Use   Smoking status: Former    Current packs/day: 0.00    Average packs/day: 1.5 packs/day for 30.0 years (45.0 ttl pk-yrs)    Types: Cigarettes    Start date: 04/20/1983    Quit date:  04/19/2013    Years since quitting: 9.7    Passive exposure: Current   Smokeless tobacco: Current    Types: Snuff  Substance Use Topics   Alcohol use: No    Alcohol/week: 0.0 standard drinks of alcohol    Comment: occ     Current Outpatient Medications:    atorvastatin (LIPITOR) 80 MG tablet, Take 1 tablet (80 mg total) by mouth daily at 6 PM., Disp: 90 tablet, Rfl: 1   clopidogrel (PLAVIX) 75 MG tablet, Take 1 tablet (75 mg total) by mouth daily., Disp: 90 tablet, Rfl: 1   empagliflozin (JARDIANCE) 25 MG TABS tablet, Take 1 tablet (25 mg total) by mouth daily before breakfast., Disp: 90 tablet, Rfl: 1   ezetimibe (ZETIA) 10 MG tablet, Take 1 tablet (10 mg total) by mouth daily., Disp: 90 tablet, Rfl: 1   famotidine (PEPCID) 40 MG tablet, Take 1 tablet (40 mg total) by mouth at bedtime., Disp: 90 tablet, Rfl: 1   fluticasone (FLONASE) 50 MCG/ACT nasal spray, Place 2 sprays into both nostrils as needed., Disp: 48 g, Rfl: 1   Glucagon (GVOKE HYPOPEN 1-PACK) 1 MG/0.2ML SOAJ, Inject 1 each into the skin daily as needed. If glucose very low and unable to eat/drink, Disp: 0.2 mL, Rfl: 1   insulin glargine (LANTUS SOLOSTAR) 100 UNIT/ML Solostar Pen, Inject 50 Units into the skin daily., Disp: 45 mL, Rfl: 1   insulin lispro (HUMALOG KWIKPEN) 100 UNIT/ML KwikPen, Inject 4 Units into the skin 3 (three) times daily., Disp: 9 mL, Rfl: 2   Insulin Pen Needle 32G X 6 MM MISC, 1 each by Does not apply route daily at 2 PM., Disp: 100 each, Rfl: 1   lisinopril (ZESTRIL) 2.5 MG tablet, Take 1 tablet (2.5 mg total) by mouth daily., Disp: 90 tablet, Rfl: 1   loratadine (CLARITIN) 10 MG tablet, Take 1 tablet by mouth daily., Disp: , Rfl:    metaxalone (SKELAXIN) 800 MG tablet, Take 1 tablet (800 mg total) by mouth 3 (three) times daily., Disp: 90 tablet, Rfl: 1   metFORMIN (GLUCOPHAGE-XR) 750 MG 24 hr tablet, Take 2 tablets (1,500 mg total) by mouth daily with breakfast., Disp: 180 tablet, Rfl: 0   metoprolol  succinate (TOPROL-XL) 25 MG 24 hr tablet, Take 0.5 tablets (12.5 mg total) by mouth daily., Disp: 45 tablet, Rfl: 1   omega-3 acid ethyl esters (LOVAZA) 1 g capsule, Take 2 capsules (2 g total) by mouth 2 (two) times daily., Disp: 360 capsule, Rfl: 1   ONETOUCH ULTRA test strip, USE AS DIRECTED, Disp: 100 strip, Rfl: 11   pantoprazole (PROTONIX) 40 MG tablet, Take 1 tablet (40 mg total) by mouth every morning., Disp: 90 tablet,  Rfl: 1   pioglitazone (ACTOS) 15 MG tablet, TAKE 1 TABLET (15 MG TOTAL) BY MOUTH DAILY., Disp: 90 tablet, Rfl: 1   pregabalin (LYRICA) 100 MG capsule, Take 1 capsule (100 mg total) by mouth at bedtime., Disp: 90 capsule, Rfl: 1   sildenafil (VIAGRA) 100 MG tablet, TAKE 1/2 TO 1 TABLET BY MOUTH DAILY AS NEEDED FOR ERECTILE DYSFUNCTION, Disp: 30 tablet, Rfl: 0   TRULICITY 4.5 MG/0.5ML SOPN, Inject 4.5 mg into the skin once a week., Disp: , Rfl:   No Known Allergies  I personally reviewed active problem list, medication list, allergies, family history, social history, health maintenance with the patient/caregiver today.   ROS  Constitutional: Negative for fever , positive for  weight change.  Respiratory: Negative for cough and shortness of breath.   Cardiovascular: Negative for chest pain or palpitations.  Gastrointestinal: Negative for abdominal pain, no bowel changes.  Musculoskeletal: Negative for gait problem or joint swelling.  Skin: Negative for rash.  Neurological: Negative for dizziness or headache.  No other specific complaints in a complete review of systems (except as listed in HPI above).   Objective  Vitals:   01/16/23 0756  BP: 132/70  Pulse: 85  Resp: 16  SpO2: 96%  Weight: 230 lb (104.3 kg)  Height: 5\' 8"  (1.727 m)    Body mass index is 34.97 kg/m.  Physical Exam  Constitutional: Patient appears well-developed and well-nourished. No distress.  HEENT: head atraumatic, normocephalic, pupils equal and reactive to light, neck  supple Cardiovascular: Normal rate, regular rhythm and normal heart sounds.  No murmur heard. No BLE edema. Pulmonary/Chest: Effort normal and breath sounds normal. No respiratory distress. Abdominal: Soft.  There is no tenderness. Psychiatric: Patient has a normal mood and affect. behavior is normal. Judgment and thought content normal.   Recent Results (from the past 2160 hour(s))  Glucose, capillary     Status: Abnormal   Collection Time: 12/05/22  6:49 AM  Result Value Ref Range   Glucose-Capillary 159 (H) 70 - 99 mg/dL    Comment: Glucose reference range applies only to samples taken after fasting for at least 8 hours.  POCT HgB A1C     Status: Abnormal   Collection Time: 01/16/23  7:58 AM  Result Value Ref Range   Hemoglobin A1C 11.3 (A) 4.0 - 5.6 %   HbA1c POC (<> result, manual entry)     HbA1c, POC (prediabetic range)     HbA1c, POC (controlled diabetic range)      Diabetic Foot Exam: Diabetic Foot Exam - Simple   Simple Foot Form Visual Inspection See comments: Yes Sensation Testing Intact to touch and monofilament testing bilaterally: Yes Pulse Check Posterior Tibialis and Dorsalis pulse intact bilaterally: Yes Comments Onychomycosis toe       PHQ2/9:    01/16/2023    7:56 AM 10/07/2022    4:05 PM 09/29/2022    8:20 AM 06/27/2022   10:23 AM 06/20/2022    1:21 PM  Depression screen PHQ 2/9  Decreased Interest 0 0 0 0 0  Down, Depressed, Hopeless 0 0 0 0 0  PHQ - 2 Score 0 0 0 0 0  Altered sleeping 0 0 0 0 0  Tired, decreased energy 0 0 0 0 0  Change in appetite 0 0 0 0 0  Feeling bad or failure about yourself  0 0 0 0 0  Trouble concentrating 0 0 0 0 0  Moving slowly or fidgety/restless 0 0 0 0 0  Suicidal thoughts 0 0 0 0 0  PHQ-9 Score 0 0 0 0 0  Difficult doing work/chores  Not difficult at all   Not difficult at all    phq 9 is negative   Fall Risk:    01/16/2023    7:56 AM 10/07/2022    4:05 PM 09/29/2022    8:20 AM 06/27/2022   10:22 AM  06/20/2022    1:21 PM  Fall Risk   Falls in the past year? 0 0 0 0 0  Number falls in past yr: 0 0 0  0  Injury with Fall? 0 0 0  0  Risk for fall due to : No Fall Risks  No Fall Risks No Fall Risks   Follow up Falls prevention discussed  Falls prevention discussed Falls prevention discussed       Functional Status Survey: Is the patient deaf or have difficulty hearing?: No Does the patient have difficulty seeing, even when wearing glasses/contacts?: No Does the patient have difficulty concentrating, remembering, or making decisions?: No Does the patient have difficulty walking or climbing stairs?: No Does the patient have difficulty dressing or bathing?: No Does the patient have difficulty doing errands alone such as visiting a doctor's office or shopping?: No    Assessment & Plan  1. Type 2 diabetes mellitus with hyperglycemia, with long-term current use of insulin (HCC)  - POCT HgB A1C - Urine Microalbumin w/creat. ratio - HM Diabetes Foot Exam - metFORMIN (GLUCOPHAGE-XR) 750 MG 24 hr tablet; Take 2 tablets (1,500 mg total) by mouth daily with breakfast.  Dispense: 180 tablet; Refill: 0 - empagliflozin (JARDIANCE) 25 MG TABS tablet; Take 1 tablet (25 mg total) by mouth daily before breakfast.  Dispense: 90 tablet; Refill: 1 - Ambulatory referral to Endocrinology  2. Need for immunization against influenza  - Flu vaccine trivalent PF, 6mos and older(Flulaval,Afluria,Fluarix,Fluzone)  3. Dyslipidemia associated with type 2 diabetes mellitus (HCC)  On statin therapy   4. Coronary artery disease of native artery of native heart with stable angina pectoris (HCC)  On statin and plavix and metoprolol   5. OSA (obstructive sleep apnea)   Not wearing CPAP

## 2023-01-16 ENCOUNTER — Ambulatory Visit (INDEPENDENT_AMBULATORY_CARE_PROVIDER_SITE_OTHER): Payer: 59 | Admitting: Family Medicine

## 2023-01-16 ENCOUNTER — Encounter: Payer: Self-pay | Admitting: Family Medicine

## 2023-01-16 VITALS — BP 132/70 | HR 85 | Resp 16 | Ht 68.0 in | Wt 230.0 lb

## 2023-01-16 DIAGNOSIS — E785 Hyperlipidemia, unspecified: Secondary | ICD-10-CM

## 2023-01-16 DIAGNOSIS — E1169 Type 2 diabetes mellitus with other specified complication: Secondary | ICD-10-CM

## 2023-01-16 DIAGNOSIS — G4733 Obstructive sleep apnea (adult) (pediatric): Secondary | ICD-10-CM

## 2023-01-16 DIAGNOSIS — Z794 Long term (current) use of insulin: Secondary | ICD-10-CM | POA: Diagnosis not present

## 2023-01-16 DIAGNOSIS — I25118 Atherosclerotic heart disease of native coronary artery with other forms of angina pectoris: Secondary | ICD-10-CM | POA: Diagnosis not present

## 2023-01-16 DIAGNOSIS — Z23 Encounter for immunization: Secondary | ICD-10-CM

## 2023-01-16 DIAGNOSIS — E1165 Type 2 diabetes mellitus with hyperglycemia: Secondary | ICD-10-CM | POA: Diagnosis not present

## 2023-01-16 LAB — POCT GLYCOSYLATED HEMOGLOBIN (HGB A1C): Hemoglobin A1C: 11.3 % — AB (ref 4.0–5.6)

## 2023-01-16 MED ORDER — METFORMIN HCL ER 750 MG PO TB24: 1500.0000 mg | ORAL_TABLET | Freq: Every day | ORAL | 0 refills | Status: DC

## 2023-01-16 MED ORDER — EMPAGLIFLOZIN 25 MG PO TABS
25.0000 mg | ORAL_TABLET | Freq: Every day | ORAL | 1 refills | Status: DC
Start: 2023-01-16 — End: 2023-07-21

## 2023-01-17 LAB — CBC WITH DIFFERENTIAL/PLATELET
Absolute Monocytes: 598 {cells}/uL (ref 200–950)
Basophils Absolute: 43 {cells}/uL (ref 0–200)
Basophils Relative: 0.7 %
Eosinophils Absolute: 79 {cells}/uL (ref 15–500)
Eosinophils Relative: 1.3 %
HCT: 46.9 % (ref 38.5–50.0)
Hemoglobin: 15.7 g/dL (ref 13.2–17.1)
Lymphs Abs: 1830 {cells}/uL (ref 850–3900)
MCH: 31.5 pg (ref 27.0–33.0)
MCHC: 33.5 g/dL (ref 32.0–36.0)
MCV: 94.2 fL (ref 80.0–100.0)
MPV: 11.5 fL (ref 7.5–12.5)
Monocytes Relative: 9.8 %
Neutro Abs: 3550 {cells}/uL (ref 1500–7800)
Neutrophils Relative %: 58.2 %
Platelets: 226 10*3/uL (ref 140–400)
RBC: 4.98 10*6/uL (ref 4.20–5.80)
RDW: 11.6 % (ref 11.0–15.0)
Total Lymphocyte: 30 %
WBC: 6.1 10*3/uL (ref 3.8–10.8)

## 2023-01-17 LAB — B12 AND FOLATE PANEL
Folate: 14 ng/mL
Vitamin B-12: 913 pg/mL (ref 200–1100)

## 2023-01-17 LAB — COMPLETE METABOLIC PANEL WITH GFR
AG Ratio: 1.8 (calc) (ref 1.0–2.5)
ALT: 22 U/L (ref 9–46)
AST: 15 U/L (ref 10–35)
Albumin: 4.7 g/dL (ref 3.6–5.1)
Alkaline phosphatase (APISO): 62 U/L (ref 35–144)
BUN: 16 mg/dL (ref 7–25)
CO2: 27 mmol/L (ref 20–32)
Calcium: 9.8 mg/dL (ref 8.6–10.3)
Chloride: 103 mmol/L (ref 98–110)
Creat: 0.7 mg/dL (ref 0.70–1.35)
Globulin: 2.6 g/dL (ref 1.9–3.7)
Glucose, Bld: 175 mg/dL — ABNORMAL HIGH (ref 65–99)
Potassium: 4.2 mmol/L (ref 3.5–5.3)
Sodium: 138 mmol/L (ref 135–146)
Total Bilirubin: 1.7 mg/dL — ABNORMAL HIGH (ref 0.2–1.2)
Total Protein: 7.3 g/dL (ref 6.1–8.1)
eGFR: 105 mL/min/{1.73_m2} (ref >=60)

## 2023-01-17 LAB — LIPID PANEL
Cholesterol: 105 mg/dL (ref <200)
HDL: 37 mg/dL — ABNORMAL LOW (ref >=40)
LDL Cholesterol (Calc): 51 mg/dL
Non-HDL Cholesterol (Calc): 68 mg/dL (ref <130)
Total CHOL/HDL Ratio: 2.8 (calc) (ref <5.0)
Triglycerides: 85 mg/dL (ref <150)

## 2023-01-17 LAB — MICROALBUMIN / CREATININE URINE RATIO
Creatinine, Urine: 99 mg/dL (ref 20–320)
Microalb Creat Ratio: 246 mg/g{creat} — ABNORMAL HIGH (ref <30)
Microalb, Ur: 24.4 mg/dL

## 2023-02-04 ENCOUNTER — Other Ambulatory Visit: Payer: Self-pay | Admitting: Family Medicine

## 2023-02-04 DIAGNOSIS — N529 Male erectile dysfunction, unspecified: Secondary | ICD-10-CM

## 2023-03-12 ENCOUNTER — Other Ambulatory Visit: Payer: Self-pay | Admitting: Family Medicine

## 2023-03-14 ENCOUNTER — Other Ambulatory Visit: Payer: Self-pay | Admitting: Family Medicine

## 2023-03-14 DIAGNOSIS — N529 Male erectile dysfunction, unspecified: Secondary | ICD-10-CM

## 2023-03-21 ENCOUNTER — Other Ambulatory Visit: Payer: Self-pay | Admitting: Family Medicine

## 2023-03-21 DIAGNOSIS — E1121 Type 2 diabetes mellitus with diabetic nephropathy: Secondary | ICD-10-CM

## 2023-03-23 ENCOUNTER — Other Ambulatory Visit: Payer: Self-pay | Admitting: Family Medicine

## 2023-03-23 ENCOUNTER — Other Ambulatory Visit: Payer: Self-pay

## 2023-03-23 DIAGNOSIS — E1165 Type 2 diabetes mellitus with hyperglycemia: Secondary | ICD-10-CM

## 2023-03-23 DIAGNOSIS — I25118 Atherosclerotic heart disease of native coronary artery with other forms of angina pectoris: Secondary | ICD-10-CM

## 2023-03-23 DIAGNOSIS — E1169 Type 2 diabetes mellitus with other specified complication: Secondary | ICD-10-CM

## 2023-03-23 DIAGNOSIS — E1121 Type 2 diabetes mellitus with diabetic nephropathy: Secondary | ICD-10-CM

## 2023-03-23 MED ORDER — LANTUS SOLOSTAR 100 UNIT/ML ~~LOC~~ SOPN: 50.0000 [IU] | PEN_INJECTOR | Freq: Every day | SUBCUTANEOUS | 0 refills | Status: DC

## 2023-03-23 NOTE — Telephone Encounter (Signed)
Medication Refill -  Most Recent Primary Care Visit:  Provider: Alba Cory  Department: [facility]  Visit Type: OFFICE VISIT  Date: 01/16/2023  Medication: ONETOUCH ULTRA test strip, Insulin Pen Needle 32G X 6 MM MISC, clopidogrel (PLAVIX) 75 MG tablet and  omega-3 acid ethyl esters (LOVAZA) 1 g capsule  Has the patient contacted their pharmacy? No  Is this the correct pharmacy for this prescription? Yes  This is the patient's preferred pharmacy: [id number], [place] - [address] [place] [address] Phone: [phone] Fax: [phone]  Has the prescription been filled recently? Yes  Is the patient out of the medication? Yes  Has the patient been seen for an appointment in the last year OR does the patient have an upcoming appointment? Yes  Can we respond through MyChart? No  Agent: Please be advised that Rx refills may take up to 3 business days. We ask that you follow-up with your pharmacy.

## 2023-03-24 MED ORDER — CLOPIDOGREL BISULFATE 75 MG PO TABS: 75.0000 mg | ORAL_TABLET | Freq: Every day | ORAL | 1 refills | Status: DC

## 2023-03-24 MED ORDER — ONETOUCH ULTRA VI STRP
100.0000 | ORAL_STRIP | 11 refills | Status: AC
Start: 2023-03-24 — End: ?

## 2023-03-24 MED ORDER — OMEGA-3-ACID ETHYL ESTERS 1 G PO CAPS: 2.0000 g | ORAL_CAPSULE | Freq: Two times a day (BID) | ORAL | 1 refills | Status: DC

## 2023-03-24 MED ORDER — INSULIN PEN NEEDLE 32G X 6 MM MISC: 1.0000 | Freq: Every day | 1 refills | Status: DC

## 2023-03-24 NOTE — Telephone Encounter (Signed)
Requested Prescriptions  Pending Prescriptions Disp Refills   clopidogrel (PLAVIX) 75 MG tablet 90 tablet 1    Sig: Take 1 tablet (75 mg total) by mouth daily.     Hematology: Antiplatelets - clopidogrel Passed - 03/24/2023 11:38 AM      Passed - HCT in normal range and within 180 days    HCT  Date Value Ref Range Status  01/16/2023 46.9 38.5 - 50.0 % Final         Passed - HGB in normal range and within 180 days    Hemoglobin  Date Value Ref Range Status  01/16/2023 15.7 13.2 - 17.1 g/dL Final         Passed - PLT in normal range and within 180 days    Platelets  Date Value Ref Range Status  01/16/2023 226 140 - 400 Thousand/uL Final         Passed - Cr in normal range and within 360 days    Creat  Date Value Ref Range Status  01/16/2023 0.70 0.70 - 1.35 mg/dL Final   Creatinine, Urine  Date Value Ref Range Status  01/16/2023 99 20 - 320 mg/dL Final         Passed - Valid encounter within last 6 months    Recent Outpatient Visits           2 months ago Type 2 diabetes mellitus with hyperglycemia, with long-term current use of insulin [facility])   [place] [place], Danna Hefty, MD   5 months ago Type 2 diabetes mellitus with hyperglycemia, with long-term current use of insulin [facility])   [place] [facility] Margarita Mail, DO   5 months ago Dyslipidemia associated with type 2 diabetes mellitus [facility])   [place] [facility] [place], Danna Hefty, MD   9 months ago Dyslipidemia associated with type 2 diabetes mellitus [facility])   [place] [facility] Alba Cory, MD   9 months ago Viral upper respiratory tract infection   [facility] Margarita Mail, DO       Future Appointments             In 1 month Carlynn Purl, Danna Hefty, MD [facility]   In 1 month Erroll Luna, [place], MD [facility] Endocrinology             Insulin Pen Needle  32G X 6 MM MISC 100 each 1    Sig: 1 each by Does not apply route daily at 2 PM.     Endocrinology: Diabetes - Testing Supplies Passed - 03/24/2023 11:38 AM      Passed - Valid encounter within last 12 months    Recent Outpatient Visits           2 months ago Type 2 diabetes mellitus with hyperglycemia, with long-term current use of insulin [facility])   [place] [facility] [place], Danna Hefty, MD   5 months ago Type 2 diabetes mellitus with hyperglycemia, with long-term current use of insulin [facility])   [place] [facility] Margarita Mail, DO   5 months ago Dyslipidemia associated with type 2 diabetes mellitus [facility])    [facility] Alba Cory, MD   9 months ago Dyslipidemia associated with type 2 diabetes mellitus [facility])    [facility] Alba Cory, MD   9 months ago Viral upper respiratory tract infection   [facility] Margarita Mail, [place]  Future Appointments             In 1 month Alba Cory, MD [facility], [facility] [place]   In 1 month [place], [place], MD [facility] Endocrinology             glucose blood [facility] ULTRA) test strip 100 strip 11    Sig: 100 each by Other route See admin instructions. Use as instructed     Endocrinology: Diabetes - Testing Supplies Passed - 03/24/2023 11:38 AM      Passed - Valid encounter within last 12 months    Recent Outpatient Visits           2 months ago Type 2 diabetes mellitus with hyperglycemia, with long-term current use of insulin [facility])   [place] [facility] [place], Danna Hefty, MD   5 months ago Type 2 diabetes mellitus with hyperglycemia, with long-term current use of insulin [facility])   [place] [facility] Margarita Mail, DO   5 months ago Dyslipidemia associated with type 2 diabetes mellitus [facility])   [facility]  Alba Cory, MD   9 months ago Dyslipidemia associated with type 2 diabetes mellitus [facility])   [place] [facility] Alba Cory, MD   9 months ago Viral upper respiratory tract infection   [facility] Margarita Mail, DO       Future Appointments             In 1 month Alba Cory, MD [facility], [facility]   In 1 month Thapa, [place], MD [facility] Endocrinology             omega-3 acid ethyl esters (LOVAZA) 1 g capsule 360 capsule 1    Sig: Take 2 capsules (2 g total) by mouth 2 (two) times daily.     Endocrinology:  Nutritional Agents - omega-3 acid ethyl esters Failed - 03/24/2023 11:38 AM      Failed - Lipid Panel in normal range within the last 12 months    Cholesterol, Total  Date Value Ref Range Status  08/17/2015 150 100 - 199 mg/dL Final   Cholesterol  Date Value Ref Range Status  01/16/2023 105 <200 mg/dL Final  16/01/9603 540 0 - 200 mg/dL Final   Ldl Cholesterol, Calc  Date Value Ref Range Status  08/02/2011 49 0 - 100 mg/dL Final   LDL Cholesterol (Calc)  Date Value Ref Range Status  01/16/2023 51 mg/dL (calc) Final    Comment:    Reference range: <100 . Desirable range <100 mg/dL for primary prevention;   <70 mg/dL for patients with CHD or diabetic patients  with > or = 2 CHD risk factors. Marland Kitchen LDL-C is now calculated using the Martin-Hopkins  calculation, which is a validated novel method providing  better accuracy than the Friedewald equation in the  estimation of LDL-C.  Horald Pollen et al. Lenox Ahr. 9811;914(78): 2061-2068  (http://education.[employer].com/faq/FAQ164)    HDL Cholesterol  Date Value Ref Range Status  08/02/2011 20 (L) 40 - 60 mg/dL Final   HDL  Date Value Ref Range Status  01/16/2023 37 (L) > OR = 40 mg/dL Final  29/56/2130 24 (L) >39 mg/dL Final   Triglycerides  Date Value Ref Range Status  01/16/2023 85 <150 mg/dL Final  86/57/8469  629 (H) 0 - 200 mg/dL Final         Passed - Valid encounter within last 12 months  Recent Outpatient Visits           2 months ago Type 2 diabetes mellitus with hyperglycemia, with long-term current use of insulin [facility])   [place] [facility] [place], Danna Hefty, MD   5 months ago Type 2 diabetes mellitus with hyperglycemia, with long-term current use of insulin [facility])   [place] [facility] Margarita Mail, DO   5 months ago Dyslipidemia associated with type 2 diabetes mellitus [facility])   [place] [facility] Alba Cory, MD   9 months ago Dyslipidemia associated with type 2 diabetes mellitus [facility])    [facility] Alba Cory, MD   9 months ago Viral upper respiratory tract infection   [facility] Margarita Mail, DO       Future Appointments             In 1 month Carlynn Purl, Danna Hefty, MD [facility]   In 1 month [place], [place], MD [facility] Endocrinology

## 2023-03-25 ENCOUNTER — Other Ambulatory Visit: Payer: Self-pay | Admitting: Family Medicine

## 2023-03-25 DIAGNOSIS — I25118 Atherosclerotic heart disease of native coronary artery with other forms of angina pectoris: Secondary | ICD-10-CM

## 2023-03-25 DIAGNOSIS — K219 Gastro-esophageal reflux disease without esophagitis: Secondary | ICD-10-CM

## 2023-03-25 DIAGNOSIS — E785 Hyperlipidemia, unspecified: Secondary | ICD-10-CM

## 2023-04-14 ENCOUNTER — Other Ambulatory Visit: Payer: Self-pay | Admitting: Family Medicine

## 2023-04-14 DIAGNOSIS — I25118 Atherosclerotic heart disease of native coronary artery with other forms of angina pectoris: Secondary | ICD-10-CM

## 2023-04-14 DIAGNOSIS — E1121 Type 2 diabetes mellitus with diabetic nephropathy: Secondary | ICD-10-CM

## 2023-04-14 DIAGNOSIS — Z794 Long term (current) use of insulin: Secondary | ICD-10-CM

## 2023-04-14 DIAGNOSIS — I10 Essential (primary) hypertension: Secondary | ICD-10-CM

## 2023-04-14 DIAGNOSIS — E1169 Type 2 diabetes mellitus with other specified complication: Secondary | ICD-10-CM

## 2023-04-14 NOTE — Telephone Encounter (Signed)
 He will be out before then

## 2023-04-17 ENCOUNTER — Other Ambulatory Visit: Payer: Self-pay | Admitting: Family Medicine

## 2023-04-17 DIAGNOSIS — N529 Male erectile dysfunction, unspecified: Secondary | ICD-10-CM

## 2023-04-24 ENCOUNTER — Ambulatory Visit: Payer: 59 | Admitting: Family Medicine

## 2023-04-24 ENCOUNTER — Encounter: Payer: Self-pay | Admitting: Family Medicine

## 2023-04-24 ENCOUNTER — Ambulatory Visit (INDEPENDENT_AMBULATORY_CARE_PROVIDER_SITE_OTHER): Payer: Self-pay | Admitting: Family Medicine

## 2023-04-24 VITALS — BP 132/74 | HR 90 | Resp 16 | Ht 68.0 in | Wt 232.6 lb

## 2023-04-24 DIAGNOSIS — R221 Localized swelling, mass and lump, neck: Secondary | ICD-10-CM

## 2023-04-24 DIAGNOSIS — K219 Gastro-esophageal reflux disease without esophagitis: Secondary | ICD-10-CM

## 2023-04-24 DIAGNOSIS — E785 Hyperlipidemia, unspecified: Secondary | ICD-10-CM

## 2023-04-24 DIAGNOSIS — E1140 Type 2 diabetes mellitus with diabetic neuropathy, unspecified: Secondary | ICD-10-CM

## 2023-04-24 DIAGNOSIS — E1169 Type 2 diabetes mellitus with other specified complication: Secondary | ICD-10-CM

## 2023-04-24 DIAGNOSIS — I25118 Atherosclerotic heart disease of native coronary artery with other forms of angina pectoris: Secondary | ICD-10-CM

## 2023-04-24 DIAGNOSIS — K7581 Nonalcoholic steatohepatitis (NASH): Secondary | ICD-10-CM

## 2023-04-24 DIAGNOSIS — Z794 Long term (current) use of insulin: Secondary | ICD-10-CM

## 2023-04-24 DIAGNOSIS — J029 Acute pharyngitis, unspecified: Secondary | ICD-10-CM

## 2023-04-24 DIAGNOSIS — G4733 Obstructive sleep apnea (adult) (pediatric): Secondary | ICD-10-CM

## 2023-04-24 DIAGNOSIS — I10 Essential (primary) hypertension: Secondary | ICD-10-CM

## 2023-04-24 DIAGNOSIS — J3089 Other allergic rhinitis: Secondary | ICD-10-CM

## 2023-04-24 LAB — POCT GLYCOSYLATED HEMOGLOBIN (HGB A1C): Hemoglobin A1C: 7.6 % — AB (ref 4.0–5.6)

## 2023-04-24 LAB — POCT RAPID STREP A (OFFICE): Rapid Strep A Screen: NEGATIVE

## 2023-04-24 MED ORDER — FAMOTIDINE 40 MG PO TABS
40.0000 mg | ORAL_TABLET | Freq: Every day | ORAL | 1 refills | Status: DC
Start: 2023-04-24 — End: 2023-07-31

## 2023-04-24 MED ORDER — NYSTATIN 100000 UNIT/ML MT SUSP: 10.0000 mL | Freq: Four times a day (QID) | OROMUCOSAL | 0 refills | Status: DC | PRN

## 2023-04-24 MED ORDER — TRULICITY 1.5 MG/0.5ML ~~LOC~~ SOAJ
1.5000 mg | SUBCUTANEOUS | 0 refills | Status: DC
Start: 2023-04-24 — End: 2023-07-31

## 2023-04-24 MED ORDER — PREGABALIN 100 MG PO CAPS: 100.0000 mg | ORAL_CAPSULE | Freq: Every evening | ORAL | 1 refills | Status: DC

## 2023-04-24 NOTE — Progress Notes (Signed)
Name: Jesse Lawrence.   MRN: [id number]    DOB: Jun 12, 1960   Date:04/24/2023       Progress Note  Subjective  Chief Complaint  Chief Complaint  Patient presents with   Medical Management of Chronic Issues   HPI   DM II : He is currently taking Lantus 50 units, Jardiance, Actos , Metformin and premeal insulin and his A1C has gone down from  11.3 % down to 7.6 % with increase in compliance with diet and medication and also adding pre meal insulin 3 months ago.He states pharmacy did not give him the Trulicity 4.5 mg, we will resume at 1.5 mg dose today ( reminded of possible side effects )He will keep visit with Endo. He has associated morbid obesity, dyslipidemia, HTN, diabetic neuropathy   HTN/CAD with stable angina : no palpitation, angina has been  controlled with medication, on low dose ACE and half pill metoprolol , he has been taking Atorvastatin, Zetia  and Lovaza. He sees cardiologist - Dr. Mariah Milling . Last LDL at goal    ED: he has difficulty maintaining and erection has been doing well on Viagra prn    OSA: he has not been compliant with CPAP machine, he cannot tolerated it, he is aware of the importance of wearing it . Unchanged    GERD: he takes pantoprazole in am and pepcid prn ,and doing well. He only has symptoms when he eats spicy foods Doing well    Obesity: weight is up a few pounds, BMI above 35 with co-morbidities such as DM and HTN ,we will resume Trulicity today    Radiculitis:  he noticed left lower back pain back with radiculitis down right leg back in June 2023 , we gave him meloxicam, skelaxin and he tried elavil ( he had for shingles) but pain did not improve and progressed to pain radiating  down both legs ( intermittently, he states a lot of times pain radiates to his groin - worse when he was getting in and out of his truck  Symptoms are prn now    NASH : he is on Actos and Metformin, needs to follow up with Dr. Servando Snare. No nausea or vomiting, denies abdominal  pain  Sore throat: he states woke up in the middle of the night with sore throat, could not swallow, he choked on pork skins last night. He states symptoms are better now, feels swollen No fever, chills or rhinorrhea.  He also had a headache this morning but improved with Aleve    Patient Active Problem List   Diagnosis Date Noted   History of colonic polyps 12/05/2022   Polyp of sigmoid colon 12/05/2022   Coronary artery disease of native artery of native heart with stable angina pectoris (HCC) 11/26/2018   OSA (obstructive sleep apnea) 05/21/2015   Perennial allergic rhinitis 11/07/2014   Arteriosclerosis of coronary artery 11/07/2014   CD (contact dermatitis) 11/07/2014   Decreased libido 11/07/2014   Diabetes mellitus with renal manifestation (HCC) 11/07/2014   Gastro-esophageal reflux disease without esophagitis 11/07/2014   H/O acute myocardial infarction 11/07/2014   Hypercholesteremia 11/07/2014   Benign hypertension 11/07/2014   Hypertriglyceridemia 11/07/2014   H/O high risk medication treatment 11/07/2014   NASH (nonalcoholic steatohepatitis) 11/07/2014   Microalbuminuria 11/07/2014   Adult BMI 30+ 11/07/2014   Fungal infection of toenail 11/07/2014   Tobacco abuse 11/07/2014   ED (erectile dysfunction) of organic origin 09/29/2006    Past Surgical History:  Procedure Laterality Date  cadiac stenting     CARDIAC CATHETERIZATION     Stents 2003 & 2004   COLONOSCOPY WITH PROPOFOL N/A 12/05/2022   Procedure: COLONOSCOPY WITH PROPOFOL;  Surgeon: Midge Minium, MD;  [facility];  Service: Endoscopy;  Laterality: N/A;  Diabetic   KNEE ARTHROSCOPY  1996   PILONIDAL CYST EXCISION  1983   POLYPECTOMY  12/05/2022   Procedure: POLYPECTOMY;  Surgeon: Midge Minium, MD;  Location: [facility];  Service: Endoscopy;;    Family History  Problem Relation Age of Onset   Diabetes Mother    CAD Mother    Heart disease Mother    Heart attack Mother     Heart disease Father    Heart attack Father     Social History   Tobacco Use   Smoking status: Former    Current packs/day: 0.00    Average packs/day: 1.5 packs/day for 30.0 years (45.0 ttl pk-yrs)    Types: Cigarettes    Start date: 04/20/1983    Quit date: 04/19/2013    Years since quitting: 10.0    Passive exposure: Current   Smokeless tobacco: Current    Types: Snuff  Substance Use Topics   Alcohol use: No    Alcohol/week: 0.0 standard drinks of alcohol    Comment: occ     Current Outpatient Medications:    atorvastatin (LIPITOR) 80 MG tablet, TAKE 1 TABLET(80 MG) BY MOUTH DAILY AT 6 PM, Disp: 90 tablet, Rfl: 1   clopidogrel (PLAVIX) 75 MG tablet, TAKE 1 TABLET(75 MG) BY MOUTH DAILY, Disp: 90 tablet, Rfl: 1   empagliflozin (JARDIANCE) 25 MG TABS tablet, Take 1 tablet (25 mg total) by mouth daily before breakfast., Disp: 90 tablet, Rfl: 1   ezetimibe (ZETIA) 10 MG tablet, TAKE 1 TABLET BY MOUTH DAILY, Disp: 90 tablet, Rfl: 1   famotidine (PEPCID) 40 MG tablet, Take 1 tablet (40 mg total) by mouth at bedtime., Disp: 90 tablet, Rfl: 1   fluticasone (FLONASE) 50 MCG/ACT nasal spray, Place 2 sprays into both nostrils as needed., Disp: 48 g, Rfl: 1   Glucagon (GVOKE HYPOPEN 1-PACK) 1 MG/0.2ML SOAJ, Inject 1 each into the skin daily as needed. If glucose very low and unable to eat/drink, Disp: 0.2 mL, Rfl: 1   glucose blood (ONETOUCH ULTRA) test strip, 100 each by Other route See admin instructions. Use as instructed, Disp: 100 strip, Rfl: 11   insulin glargine (LANTUS SOLOSTAR) 100 UNIT/ML Solostar Pen, Inject 50 Units into the skin daily., Disp: 45 mL, Rfl: 0   insulin lispro (HUMALOG) 100 UNIT/ML KwikPen, ADMINISTER 4 UNITS UNDER THE SKIN THREE TIMES DAILY, Disp: 9 mL, Rfl: 0   Insulin Pen Needle 32G X 6 MM MISC, 1 each by Does not apply route daily at 2 PM., Disp: 100 each, Rfl: 1   lisinopril (ZESTRIL) 2.5 MG tablet, TAKE 1 TABLET(2.5 MG) BY MOUTH DAILY, Disp: 90 tablet, Rfl: 0    loratadine (CLARITIN) 10 MG tablet, Take 1 tablet by mouth daily., Disp: , Rfl:    metaxalone (SKELAXIN) 800 MG tablet, Take 1 tablet (800 mg total) by mouth 3 (three) times daily., Disp: 90 tablet, Rfl: 1   metFORMIN (GLUCOPHAGE-XR) 750 MG 24 hr tablet, TAKE 2 TABLETS(1500 MG) BY MOUTH DAILY WITH BREAKFAST, Disp: 180 tablet, Rfl: 0   metoprolol succinate (TOPROL-XL) 25 MG 24 hr tablet, TAKE 1/2 TABLET(12.5 MG) BY MOUTH DAILY, Disp: 45 tablet, Rfl: 1   omega-3 acid ethyl esters (LOVAZA) 1 g capsule, Take  2 capsules (2 g total) by mouth 2 (two) times daily., Disp: 360 capsule, Rfl: 1   pantoprazole (PROTONIX) 40 MG tablet, TAKE 1 TABLET(40 MG) BY MOUTH EVERY MORNING, Disp: 90 tablet, Rfl: 1   pioglitazone (ACTOS) 15 MG tablet, TAKE 1 TABLET (15 MG TOTAL) BY MOUTH DAILY., Disp: 90 tablet, Rfl: 1   pregabalin (LYRICA) 100 MG capsule, Take 1 capsule (100 mg total) by mouth at bedtime., Disp: 90 capsule, Rfl: 1   sildenafil (VIAGRA) 100 MG tablet, TAKE A HALF TO 1 TABLET BY MOUTH DAILY AS NEEDED FOR ERECTILE DYSFUNCTION, Disp: 30 tablet, Rfl: 0   TRULICITY 4.5 MG/0.5ML SOPN, Inject 4.5 mg into the skin once a week., Disp: , Rfl:   No Known Allergies  I personally reviewed active problem list, medication list, allergies with the patient/caregiver today.   ROS  Ten systems reviewed and is negative except as mentioned in HPI    Objective  Vitals:   04/24/23 1259  BP: 132/74  Pulse: 90  Resp: 16  SpO2: 96%  Weight: 232 lb 9.6 oz (105.5 kg)  Height: 5\' 8"  (1.727 m)    Body mass index is 35.37 kg/m.  Physical Exam  Constitutional: Patient appears well-developed and well-nourished. Obese  No distress.  HEENT: head atraumatic, normocephalic, pupils equal and reactive to light, neck supple, oral mucosa very swollen uvula with erythema and some white spots  Cardiovascular: Normal rate, regular rhythm and normal heart sounds.  No murmur heard. No BLE edema. Pulmonary/Chest: Effort normal and  breath sounds normal. No respiratory distress. Abdominal: Soft.  There is no tenderness. Psychiatric: Patient has a normal mood and affect. behavior is normal. Judgment and thought content normal.   Recent Results (from the past 2160 hours)  POCT glycosylated hemoglobin (Hb A1C)     Status: Abnormal   Collection Time: 04/24/23  1:06 PM  Result Value Ref Range   Hemoglobin A1C 7.6 (A) 4.0 - 5.6 %   HbA1c POC (<> result, manual entry)     HbA1c, POC (prediabetic range)     HbA1c, POC (controlled diabetic range)      Diabetic Foot Exam:     PHQ2/9:    04/24/2023   12:47 PM 01/16/2023    7:56 AM 10/07/2022    4:05 PM 09/29/2022    8:20 AM 06/27/2022   10:23 AM  Depression screen PHQ 2/9  Decreased Interest 0 0 0 0 0  Down, Depressed, Hopeless 0 0 0 0 0  PHQ - 2 Score 0 0 0 0 0  Altered sleeping 0 0 0 0 0  Tired, decreased energy 0 0 0 0 0  Change in appetite 0 0 0 0 0  Feeling bad or failure about yourself  0 0 0 0 0  Trouble concentrating 0 0 0 0 0  Moving slowly or fidgety/restless 0 0 0 0 0  Suicidal thoughts 0 0 0 0 0  PHQ-9 Score 0 0 0 0 0  Difficult doing work/chores Not difficult at all  Not difficult at all      phq 9 is negative  Fall Risk:    04/24/2023   12:47 PM 01/16/2023    7:56 AM 10/07/2022    4:05 PM 09/29/2022    8:20 AM 06/27/2022   10:22 AM  Fall Risk   Falls in the past year? 0 0 0 0 0  Number falls in past yr: 0 0 0 0   Injury with Fall? 0 0 0 0  Risk for fall due to : No Fall Risks No Fall Risks  No Fall Risks No Fall Risks  Follow up Falls prevention discussed;Education provided;Falls evaluation completed Falls prevention discussed  Falls prevention discussed Falls prevention discussed     Assessment and Plan    Type 2 Diabetes Mellitus Significant improvement in glycemic control with A1c decreasing from 11.3 to 7.6. Patient has been adherent to medications and dietary modifications. However, there was a misunderstanding regarding Trulicity,  which the patient stopped taking. -Resume Trulicity at 1.5mg  weekly. -Continue current regimen of Lantus, Jardiance, Actos, Metformin, and Premio insulin. -Follow-up with endocrinologist on May 14, 2023.  Hypertension/Coronary Artery Disease Stable with no reported angina. Blood pressure readings consistent at 132/70-74. -Continue current regimen of Atorvastatin, Plavix, Lisinopril, Metoprolol, and Lovaza.  Diabetic Neuropathy Reports of tingling in both feet and legs. -Continue Pregabalin for neuropathic pain.  Gastroesophageal Reflux Disease Well controlled with medication and dietary modifications. -Continue Pantoprazole and Pepcid as needed.  Pharyngitis Acute onset of sore throat after choking on food. Uvula appears inflamed on examination. -Perform rapid strep test. -Prescribe magic mouthwash for symptomatic relief.  Obesity Weight stable at 230 lbs, BMI >35. -Encourage continued dietary modifications and increased physical activity.  General Health Maintenance -Continue Ezetimibe for cholesterol management. -Continue Viagra as needed. -Continue Flonase and Loratadine for allergies. -Continue muscle relaxer as needed. -Continue Cialis as needed. -Recent colonoscopy with normal results.

## 2023-05-14 ENCOUNTER — Ambulatory Visit: Payer: 59 | Admitting: Endocrinology

## 2023-05-26 ENCOUNTER — Other Ambulatory Visit: Payer: Self-pay | Admitting: Family Medicine

## 2023-07-01 ENCOUNTER — Other Ambulatory Visit: Payer: Self-pay | Admitting: Family Medicine

## 2023-07-01 DIAGNOSIS — E1121 Type 2 diabetes mellitus with diabetic nephropathy: Secondary | ICD-10-CM

## 2023-07-04 ENCOUNTER — Other Ambulatory Visit: Payer: Self-pay | Admitting: Family Medicine

## 2023-07-04 DIAGNOSIS — N529 Male erectile dysfunction, unspecified: Secondary | ICD-10-CM

## 2023-07-11 ENCOUNTER — Other Ambulatory Visit: Payer: Self-pay | Admitting: Family Medicine

## 2023-07-21 ENCOUNTER — Other Ambulatory Visit: Payer: Self-pay | Admitting: Family Medicine

## 2023-07-21 DIAGNOSIS — Z794 Long term (current) use of insulin: Secondary | ICD-10-CM

## 2023-07-22 ENCOUNTER — Other Ambulatory Visit: Payer: Self-pay | Admitting: Family Medicine

## 2023-07-22 DIAGNOSIS — I10 Essential (primary) hypertension: Secondary | ICD-10-CM

## 2023-07-22 DIAGNOSIS — I25118 Atherosclerotic heart disease of native coronary artery with other forms of angina pectoris: Secondary | ICD-10-CM

## 2023-07-22 DIAGNOSIS — E1121 Type 2 diabetes mellitus with diabetic nephropathy: Secondary | ICD-10-CM

## 2023-07-22 DIAGNOSIS — E1165 Type 2 diabetes mellitus with hyperglycemia: Secondary | ICD-10-CM

## 2023-07-23 ENCOUNTER — Other Ambulatory Visit: Payer: Self-pay | Admitting: Family Medicine

## 2023-07-23 DIAGNOSIS — K219 Gastro-esophageal reflux disease without esophagitis: Secondary | ICD-10-CM

## 2023-07-31 ENCOUNTER — Ambulatory Visit: Payer: Self-pay | Admitting: Family Medicine

## 2023-07-31 ENCOUNTER — Encounter: Payer: Self-pay | Admitting: Family Medicine

## 2023-07-31 VITALS — BP 120/76 | HR 87 | Resp 16 | Ht 68.0 in | Wt 224.6 lb

## 2023-07-31 DIAGNOSIS — E1129 Type 2 diabetes mellitus with other diabetic kidney complication: Secondary | ICD-10-CM | POA: Diagnosis not present

## 2023-07-31 DIAGNOSIS — I25118 Atherosclerotic heart disease of native coronary artery with other forms of angina pectoris: Secondary | ICD-10-CM

## 2023-07-31 DIAGNOSIS — E1169 Type 2 diabetes mellitus with other specified complication: Secondary | ICD-10-CM

## 2023-07-31 DIAGNOSIS — E1140 Type 2 diabetes mellitus with diabetic neuropathy, unspecified: Secondary | ICD-10-CM

## 2023-07-31 DIAGNOSIS — E785 Hyperlipidemia, unspecified: Secondary | ICD-10-CM | POA: Diagnosis not present

## 2023-07-31 DIAGNOSIS — I10 Essential (primary) hypertension: Secondary | ICD-10-CM

## 2023-07-31 DIAGNOSIS — K7581 Nonalcoholic steatohepatitis (NASH): Secondary | ICD-10-CM

## 2023-07-31 DIAGNOSIS — G4733 Obstructive sleep apnea (adult) (pediatric): Secondary | ICD-10-CM

## 2023-07-31 DIAGNOSIS — R809 Proteinuria, unspecified: Secondary | ICD-10-CM

## 2023-07-31 DIAGNOSIS — K219 Gastro-esophageal reflux disease without esophagitis: Secondary | ICD-10-CM

## 2023-07-31 DIAGNOSIS — N529 Male erectile dysfunction, unspecified: Secondary | ICD-10-CM

## 2023-07-31 DIAGNOSIS — M545 Low back pain, unspecified: Secondary | ICD-10-CM

## 2023-07-31 DIAGNOSIS — Z794 Long term (current) use of insulin: Secondary | ICD-10-CM

## 2023-07-31 LAB — POCT GLYCOSYLATED HEMOGLOBIN (HGB A1C): Hemoglobin A1C: 7.8 % — AB (ref 4.0–5.6)

## 2023-07-31 MED ORDER — ATORVASTATIN CALCIUM 80 MG PO TABS: 80.0000 mg | ORAL_TABLET | Freq: Every day | ORAL | 1 refills | Status: DC

## 2023-07-31 MED ORDER — FAMOTIDINE 40 MG PO TABS
40.0000 mg | ORAL_TABLET | Freq: Every day | ORAL | 1 refills | Status: DC
Start: 2023-07-31 — End: 2024-03-02

## 2023-07-31 MED ORDER — CLOPIDOGREL BISULFATE 75 MG PO TABS: 75.0000 mg | ORAL_TABLET | Freq: Every day | ORAL | 1 refills | Status: DC

## 2023-07-31 MED ORDER — TIRZEPATIDE 5 MG/0.5ML ~~LOC~~ SOAJ: 5.0000 mg | SUBCUTANEOUS | 0 refills | Status: DC

## 2023-07-31 MED ORDER — PREGABALIN 200 MG PO CAPS: 200.0000 mg | ORAL_CAPSULE | Freq: Every evening | ORAL | 0 refills | Status: DC

## 2023-07-31 MED ORDER — OMEGA-3-ACID ETHYL ESTERS 1 G PO CAPS: 2.0000 g | ORAL_CAPSULE | Freq: Two times a day (BID) | ORAL | 1 refills | Status: DC

## 2023-07-31 MED ORDER — LISINOPRIL 2.5 MG PO TABS: 2.5000 mg | ORAL_TABLET | Freq: Every day | ORAL | 1 refills | Status: DC

## 2023-07-31 MED ORDER — SILDENAFIL CITRATE 100 MG PO TABS: ORAL_TABLET | ORAL | 0 refills | Status: DC

## 2023-07-31 MED ORDER — METFORMIN HCL ER 750 MG PO TB24: 1500.0000 mg | ORAL_TABLET | Freq: Every day | ORAL | 1 refills | Status: DC

## 2023-07-31 MED ORDER — PANTOPRAZOLE SODIUM 40 MG PO TBEC: 40.0000 mg | DELAYED_RELEASE_TABLET | ORAL | 1 refills | Status: DC

## 2023-07-31 MED ORDER — FREESTYLE LIBRE 3 PLUS SENSOR MISC: 1.0000 | 1 refills | Status: DC

## 2023-07-31 MED ORDER — METOPROLOL SUCCINATE ER 25 MG PO TB24
25.0000 mg | ORAL_TABLET | Freq: Every day | ORAL | 1 refills | Status: DC
Start: 2023-07-31 — End: 2023-10-27

## 2023-07-31 MED ORDER — BASAGLAR KWIKPEN 100 UNIT/ML ~~LOC~~ SOPN: 50.0000 [IU] | PEN_INJECTOR | Freq: Every day | SUBCUTANEOUS | 0 refills | Status: DC

## 2023-07-31 MED ORDER — EMPAGLIFLOZIN 25 MG PO TABS: 25.0000 mg | ORAL_TABLET | Freq: Every day | ORAL | 1 refills | Status: DC

## 2023-07-31 MED ORDER — EZETIMIBE 10 MG PO TABS
10.0000 mg | ORAL_TABLET | Freq: Every day | ORAL | 1 refills | Status: DC
Start: 2023-07-31 — End: 2024-03-02

## 2023-07-31 NOTE — Progress Notes (Signed)
 Name: Jesse Lawrence.   MRN: [id number]    DOB: June 02, 1960   Date:07/31/2023       Progress Note  Subjective  Chief Complaint  Chief Complaint  Patient presents with   Medical Management of Chronic Issues   Discussed the use of AI scribe software for clinical note transcription with the patient, who gave verbal consent to proceed.  History of Present Illness Jesse Lawrence. "Jesse Lawrence" is a 63 year old male with type 2 diabetes who presents for diabetes management.  His diabetes is not well-controlled, with a recent A1c of 7.8, up from 7.6 in January, though improved from 11.3 in October. He is on Jardiance , Basaglar insulin  at 50 units daily, Humalog  at 4 units before meals, Metformin  750 mg twice daily, and Pioglitazone  15 mg. 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 Dietary challenges are noted due to his work schedule, though he is incorporating healthier options like salads, hard-boiled eggs, sliced apples, and strawberries. Stress and lack of sleep are acknowledged as factors affecting his blood sugar levels.  He has a history of dyslipidemia and coronary artery disease with stable angina. He is on Lisinopril  for heart and kidney protection. No recent chest pain is reported, attributing any discomfort to acid reflux, which worsened when he ran out of pantoprazole . He also takes statin therapy, zetia , aspirin, lovaza  and metoprolol  and denies side effects of medications   Symptoms of diabetic neuropathy but also has RLS and is currently taknig  pregabalin  at night for these symptoms but not working as well lately .  He experiences low back pain that worsens with prolonged walking, affecting activities [place]. He has a history of low back pain. No migraines are reported, and he does not use a CPAP machine for sleep apnea. His BMI is 34.15, indicating obesity, but he has lost weight since  being classified as morbidly obese.    Patient Active Problem List   Diagnosis Date Noted   History of colonic polyps 12/05/2022   Polyp of sigmoid colon 12/05/2022   Coronary artery disease of native artery of native heart with stable angina pectoris (HCC) 11/26/2018   OSA (obstructive sleep apnea) 05/21/2015   Perennial allergic rhinitis 11/07/2014   Arteriosclerosis of coronary artery 11/07/2014   CD (contact dermatitis) 11/07/2014   Decreased libido 11/07/2014   Diabetes mellitus with renal manifestation (HCC) 11/07/2014   Gastro-esophageal reflux disease without esophagitis 11/07/2014   H/O acute myocardial infarction 11/07/2014   Hypercholesteremia 11/07/2014   Benign hypertension 11/07/2014   Hypertriglyceridemia 11/07/2014   H/O high risk medication treatment 11/07/2014   NASH (nonalcoholic steatohepatitis) 11/07/2014   Microalbuminuria 11/07/2014   Adult BMI 30+ 11/07/2014   Fungal infection of toenail 11/07/2014   Tobacco abuse 11/07/2014   ED (erectile dysfunction) of organic origin 09/29/2006    Past Surgical History:  Procedure Laterality Date   cadiac stenting     CARDIAC CATHETERIZATION     Stents 2003 & 2004   COLONOSCOPY WITH PROPOFOL  N/A 12/05/2022   Procedure: COLONOSCOPY WITH PROPOFOL ;  Surgeon: Marnee Sink, MD;  Location[facility];  Service: Endoscopy;  Laterality: N/A;  Diabetic   KNEE ARTHROSCOPY  1996   PILONIDAL CYST EXCISION  1983   POLYPECTOMY  12/05/2022   Procedure: POLYPECTOMY;  Surgeon: Marnee Sink, MD;  Location: [facility];  Service: Endoscopy;;    Family History  Problem Relation Age of Onset   Diabetes Mother    CAD Mother    Heart disease Mother    Heart attack Mother    Heart disease Father    Heart attack Father     Social History   Tobacco Use   Smoking status: Former    Current packs/day: 0.00    Average packs/day: 1.5 packs/day for 30.0 years (45.0 ttl pk-yrs)    Types: Cigarettes    Start date:  04/20/1983    Quit date: 04/19/2013    Years since quitting: 10.2    Passive exposure: Current   Smokeless tobacco: Current    Types: Snuff  Substance Use Topics   Alcohol use: No    Alcohol/week: 0.0 standard drinks of alcohol    Comment: occ     Current Outpatient Medications:    atorvastatin  (LIPITOR) 80 MG tablet, TAKE 1 TABLET(80 MG) BY MOUTH DAILY AT 6 PM, Disp: 90 tablet, Rfl: 1   clopidogrel  (PLAVIX ) 75 MG tablet, TAKE 1 TABLET(75 MG) BY MOUTH DAILY, Disp: 90 tablet, Rfl: 1   Dulaglutide  (TRULICITY ) 1.5 MG/0.5ML SOAJ, Inject 1.5 mg into the skin once a week., Disp: 6 mL, Rfl: 0   empagliflozin  (JARDIANCE ) 25 MG TABS tablet, TAKE 1 TABLET(25 MG) BY MOUTH DAILY BEFORE BREAKFAST, Disp: 30 tablet, Rfl: 0   ezetimibe  (ZETIA ) 10 MG tablet, TAKE 1 TABLET BY MOUTH DAILY, Disp: 90 tablet, Rfl: 1   famotidine  (PEPCID ) 40 MG tablet, Take 1 tablet (40 mg total) by mouth at bedtime., Disp: 90 tablet, Rfl: 1   fluticasone  (FLONASE ) 50 MCG/ACT nasal spray, Place 2 sprays into both nostrils as needed., Disp: 48 g, Rfl: 1   glucose blood (ONETOUCH ULTRA) test strip, 100 each by Other route See admin instructions. Use as instructed, Disp: 100 strip, Rfl: 11   Insulin  Glargine (BASAGLAR KWIKPEN) 100 UNIT/ML, INJECT 50 UNITS INTO THE SKIN DAILY, Disp: 45 mL, Rfl: 0   insulin  lispro (HUMALOG ) 100 UNIT/ML KwikPen, ADMINISTER 4 UNITS UNDER THE SKIN THREE TIMES DAILY, Disp: 9 mL, Rfl: 0   Insulin  Pen Needle 32G X 6 MM MISC, 1 each by Does not apply route daily at 2 PM., Disp: 100 each, Rfl: 1   lisinopril  (ZESTRIL ) 2.5 MG tablet, TAKE 1 TABLET(2.5 MG) BY MOUTH DAILY, Disp: 30 tablet, Rfl: 0   loratadine  (CLARITIN ) 10 MG tablet, Take 1 tablet by mouth daily., Disp: , Rfl:    metaxalone  (SKELAXIN ) 800 MG tablet, Take 1 tablet (800 mg total) by mouth 3 (three) times daily., Disp: 90 tablet, Rfl: 1   metFORMIN  (GLUCOPHAGE -XR) 750 MG 24 hr tablet, TAKE 2 TABLETS(1500 MG) BY MOUTH DAILY WITH BREAKFAST, Disp: 60  tablet, Rfl: 0   metoprolol  succinate (TOPROL -XL) 25 MG 24 hr tablet, TAKE 1/2 TABLET(12.5 MG) BY MOUTH DAILY, Disp: 45 tablet, Rfl: 1   omega-3 acid ethyl esters (LOVAZA ) 1 g capsule, Take 2 capsules (2 g total) by mouth 2 (two) times daily., Disp: 360 capsule, Rfl: 1   pantoprazole  (PROTONIX ) 40 MG tablet, TAKE 1 TABLET(40 MG) BY MOUTH EVERY MORNING, Disp: 90 tablet, Rfl: 1   pioglitazone  (ACTOS ) 15 MG tablet, TAKE 1 TABLET (15 MG TOTAL) BY MOUTH DAILY., Disp: 90 tablet, Rfl: 1   pregabalin  (LYRICA ) 100 MG capsule, Take 1 capsule (100 mg total) by mouth at bedtime., Disp: 90 capsule, Rfl: 1   sildenafil  (VIAGRA ) 100 MG tablet, TAKE A HALF TO 1 TABLET BY MOUTH DAILY AS NEEDED FOR ERECTILE DYSFUNCTION, Disp: 30  tablet, Rfl: 0   Glucagon  (GVOKE HYPOPEN  1-PACK) 1 MG/0.2ML SOAJ, Inject 1 each into the skin daily as needed. If glucose very low and unable to eat/drink (Patient not taking: Reported on 07/31/2023), Disp: 0.2 mL, Rfl: 1   magic mouthwash (nystatin , lidocaine , diphenhydrAMINE, alum & mag hydroxide) suspension, Swish and spit 10 mLs 4 (four) times daily as needed for mouth pain. (Patient not taking: Reported on 07/31/2023), Disp: 180 mL, Rfl: 0  No Known Allergies  I personally reviewed active problem list, medication list, allergies with the patient/caregiver today.   ROS  Ten systems reviewed and is negative except as mentioned in HPI    Objective Physical Exam  Constitutional: Patient appears well-developed and well-nourished. Obese  No distress.  HEENT: head atraumatic, normocephalic, pupils equal and reactive to light, neck supple Cardiovascular: Normal rate, regular rhythm and normal heart sounds.  No murmur heard. No BLE edema. Pulmonary/Chest: Effort normal and breath sounds normal. No respiratory distress. Abdominal: Soft.  There is no tenderness. Psychiatric: Patient has a normal mood and affect. behavior is normal. Judgment and thought content normal.   Vitals:    07/31/23 0918  BP: 120/76  Pulse: 87  Resp: 16  SpO2: 96%  Weight: 224 lb 9.6 oz (101.9 kg)  Height: 5\' 8"  (1.727 m)    Body mass index is 34.15 kg/m.  Recent Results (from the past 2160 hours)  POCT glycosylated hemoglobin (Hb A1C)     Status: Abnormal   Collection Time: 07/31/23  9:28 AM  Result Value Ref Range   Hemoglobin A1C 7.8 (A) 4.0 - 5.6 %   HbA1c POC (<> result, manual entry)     HbA1c, POC (prediabetic range)     HbA1c, POC (controlled diabetic range)      Diabetic Foot Exam:     PHQ2/9:    07/31/2023    9:17 AM 04/24/2023   12:47 PM 01/16/2023    7:56 AM 10/07/2022    4:05 PM 09/29/2022    8:20 AM  Depression screen PHQ 2/9  Decreased Interest 0 0 0 0 0  Down, Depressed, Hopeless 0 0 0 0 0  PHQ - 2 Score 0 0 0 0 0  Altered sleeping 0 0 0 0 0  Tired, decreased energy 0 0 0 0 0  Change in appetite 0 0 0 0 0  Feeling bad or failure about yourself  0 0 0 0 0  Trouble concentrating 0 0 0 0 0  Moving slowly or fidgety/restless 0 0 0 0 0  Suicidal thoughts 0 0 0 0 0  PHQ-9 Score 0 0 0 0 0  Difficult doing work/chores Not difficult at all Not difficult at all  Not difficult at all     phq 9 is negative  Fall Risk:    04/24/2023   12:47 PM 01/16/2023    7:56 AM 10/07/2022    4:05 PM 09/29/2022    8:20 AM 06/27/2022   10:22 AM  Fall Risk   Falls in the past year? 0 0 0 0 0  Number falls in past yr: 0 0 0 0   Injury with Fall? 0 0 0 0   Risk for fall due to : No Fall Risks No Fall Risks  No Fall Risks No Fall Risks  Follow up Falls prevention discussed;Education provided;Falls evaluation completed Falls prevention discussed  Falls prevention discussed Falls prevention discussed     Assessment & Plan Type 2 diabetes mellitus with complications A1c improved to 7.8  but above target. Microalbumin creatinine ratio increased to 246. Discussed dietary changes and continuous glucose monitoring. Considered switching to Mounjaro for better glycemic control and  weight loss. Emphasized A1c target below 7 to prevent complications. - Switch from Trulicity  to Mounjaro, starting with 5 mg and increasing to 7.5 mg as tolerated. - Continue Jardiance , Basaglar, Humalog , Metformin , and Pioglitazone . - Encourage dietary changes. - Prescribe Freestyle Libre for continuous glucose monitoring. - Educated on A1c target below 7.  Diabetic neuropathy Diabetic neuropathy with restless legs syndrome. Discussed increasing pregabalin  dose. - Increase pregabalin  to 200 mg, with option to increase to 300 mg if needed.  Coronary artery disease with angina No recent chest pain, only acid reflux symptoms. Discussed medication adherence for cardiovascular protection. - Continue lisinopril ,metoprolol   and Plavix . - Prescribe Zetia , Atorvastatin  and Lovaza  for cholesterol management.  Hypertension Hypertension well-controlled with lisinopril  and metoprolol  . - Continue lisinopril .  Obesity BMI 34.15, improved from morbid obesity. Discussed Mounjaro for weight loss and glycemic control. - Switch from Trulicity  to Mounjaro for weight loss.  Gastroesophageal reflux disease (GERD) GERD symptoms exacerbated by stopping pantoprazole . Emphasized daily pantoprazole  use. - Continue pantoprazole  daily.  Low back pain Low back pain when walking for a prolonged period of time. Discussed physical activity and therapy options. - Encourage increased physical activity. - Consider physical therapy and chiropractic care.  Follow-up Plan to monitor progress and adjust treatment. - Schedule follow-up appointment in 3 months.

## 2023-08-11 ENCOUNTER — Telehealth: Payer: Self-pay

## 2023-08-11 NOTE — Telephone Encounter (Unsigned)
 Copied from CRM [id number]. Topic: Clinical - Prescription Issue >> Aug 10, 2023  5:32 PM Sophia H wrote: Reason for CRM: Pt states that insurance is requiring a prior authorization for Continuous Glucose Sensor (FREESTYLE LIBRE 3 PLUS SENSOR) MISC.  [facility] file good to send to, pt states sensor will end in 7 days. 05/05.

## 2023-08-14 ENCOUNTER — Telehealth: Payer: Self-pay

## 2023-08-14 NOTE — Telephone Encounter (Signed)
 Please advice

## 2023-08-14 NOTE — Telephone Encounter (Signed)
 Copied from CRM [id number]. Topic: Clinical - Medication Prior Auth >> Aug 14, 2023  2:52 PM Carlatta H wrote: Reason for CRM: Patient called to get the status of prior authorization Continuous Glucose Sensor (FREESTYLE LIBRE 3 PLUS SENSOR) MISC.  [facility] on file good to send to, pt states sensor will end in 7 days. 05/05 Please call to advise//

## 2023-08-19 ENCOUNTER — Other Ambulatory Visit: Payer: Self-pay | Admitting: Family Medicine

## 2023-08-19 DIAGNOSIS — I10 Essential (primary) hypertension: Secondary | ICD-10-CM

## 2023-08-19 DIAGNOSIS — I25118 Atherosclerotic heart disease of native coronary artery with other forms of angina pectoris: Secondary | ICD-10-CM

## 2023-08-21 ENCOUNTER — Telehealth: Payer: Self-pay | Admitting: Pharmacy Technician

## 2023-08-21 ENCOUNTER — Other Ambulatory Visit (HOSPITAL_COMMUNITY): Payer: Self-pay

## 2023-08-21 NOTE — Telephone Encounter (Signed)
 Pharmacy Patient Advocate Encounter  Received notification from [employer] that Prior Authorization for FreeStyle Libre 3 Plus Sensor has been APPROVED from 08/21/23 to 08/20/24   PA #/Case ID/Reference #:

## 2023-08-21 NOTE — Telephone Encounter (Signed)
 Pharmacy Patient Advocate Encounter   Received notification from Pt Calls Messages that prior authorization for FreeStyle Libre 3 Plus Sensor is required/requested.   Insurance verification completed.   The patient is insured through [facility] .   Per test claim: PA required; PA submitted to above mentioned insurance via CoverMyMeds Key/confirmation #/EOC [id number] Status is pending

## 2023-08-21 NOTE — Telephone Encounter (Signed)
 PA request has been Submitted. New Encounter has been or will be created for follow up. For additional info see Pharmacy Prior Auth telephone encounter from 08/21/23.

## 2023-08-25 ENCOUNTER — Other Ambulatory Visit: Payer: Self-pay | Admitting: Family Medicine

## 2023-08-30 ENCOUNTER — Other Ambulatory Visit: Payer: Self-pay | Admitting: Family Medicine

## 2023-08-30 DIAGNOSIS — N529 Male erectile dysfunction, unspecified: Secondary | ICD-10-CM

## 2023-09-13 ENCOUNTER — Other Ambulatory Visit: Payer: Self-pay | Admitting: Family Medicine

## 2023-09-13 DIAGNOSIS — E1129 Type 2 diabetes mellitus with other diabetic kidney complication: Secondary | ICD-10-CM

## 2023-09-13 DIAGNOSIS — E1169 Type 2 diabetes mellitus with other specified complication: Secondary | ICD-10-CM

## 2023-09-14 ENCOUNTER — Other Ambulatory Visit: Payer: Self-pay | Admitting: Family Medicine

## 2023-09-14 DIAGNOSIS — E1169 Type 2 diabetes mellitus with other specified complication: Secondary | ICD-10-CM

## 2023-09-14 MED ORDER — TIRZEPATIDE 7.5 MG/0.5ML ~~LOC~~ SOAJ: 7.5000 mg | SUBCUTANEOUS | 0 refills | Status: DC

## 2023-09-14 NOTE — Telephone Encounter (Signed)
 Called pt straight to vm unable to leave vm due to being full

## 2023-09-28 ENCOUNTER — Other Ambulatory Visit: Payer: Self-pay | Admitting: Family Medicine

## 2023-10-03 ENCOUNTER — Other Ambulatory Visit: Payer: Self-pay | Admitting: Family Medicine

## 2023-10-03 DIAGNOSIS — E785 Hyperlipidemia, unspecified: Secondary | ICD-10-CM

## 2023-10-05 ENCOUNTER — Other Ambulatory Visit: Payer: Self-pay | Admitting: Family Medicine

## 2023-10-05 DIAGNOSIS — E1169 Type 2 diabetes mellitus with other specified complication: Secondary | ICD-10-CM

## 2023-10-05 NOTE — Telephone Encounter (Unsigned)
 Copied from CRM [id number]. Topic: Clinical - Medication Refill >> Oct 05, 2023 11:51 AM Zebedee SAUNDERS wrote: Medication: MOUNJARO  7.5 MG/0.5ML Pen [Pharmacy Med Name: MOUNJARO  7.5 MG/0.5  Has the patient contacted their pharmacy? Yes (Agent: If no, request that the patient contact the pharmacy for the refill. If patient does not wish to contact the pharmacy document the reason why and proceed with request.) (Agent: If yes, when and what did the pharmacy advise?)  This is the patient's preferred pharmacy:  [phone] [place] [place] - [address] [place] [address] Phone: [phone] Fax: [phone]   Is this the correct pharmacy for this prescription? Yes If no, delete pharmacy and type the correct one.   Has the prescription been filled recently? Yes  Is the patient out of the medication? Yes  Has the patient been seen for an appointment in the last year OR does the patient have an upcoming appointment? Yes  Can we respond through MyChart? Yes  Agent: Please be advised that Rx refills may take up to 3 business days. We ask that you follow-up with your pharmacy.

## 2023-10-06 ENCOUNTER — Other Ambulatory Visit: Payer: Self-pay | Admitting: Family Medicine

## 2023-10-06 DIAGNOSIS — N529 Male erectile dysfunction, unspecified: Secondary | ICD-10-CM

## 2023-10-07 ENCOUNTER — Other Ambulatory Visit: Payer: Self-pay | Admitting: Family Medicine

## 2023-10-07 DIAGNOSIS — K7581 Nonalcoholic steatohepatitis (NASH): Secondary | ICD-10-CM

## 2023-10-07 DIAGNOSIS — E1169 Type 2 diabetes mellitus with other specified complication: Secondary | ICD-10-CM

## 2023-10-07 NOTE — Telephone Encounter (Signed)
 Too soon for refill.  Requested Prescriptions  Pending Prescriptions Disp Refills   tirzepatide  (MOUNJARO ) 7.5 MG/0.5ML Pen 2 mL 0    Sig: Inject 7.5 mg into the skin once a week.     Off-Protocol Failed - 10/07/2023 10:39 AM      Failed - Medication not assigned to a protocol, review manually.      Passed - Valid encounter within last 12 months    Recent Outpatient Visits           2 months ago Dyslipidemia associated with type 2 diabetes mellitus [facility])   [facility] Sowles, Krichna, MD       Future Appointments             In 3 weeks [place], Krichna, MD [facility], [facility]

## 2023-10-08 ENCOUNTER — Other Ambulatory Visit: Payer: Self-pay | Admitting: Family Medicine

## 2023-10-08 DIAGNOSIS — E1169 Type 2 diabetes mellitus with other specified complication: Secondary | ICD-10-CM

## 2023-10-12 NOTE — Telephone Encounter (Signed)
 Pt following up on 10/30/23

## 2023-10-27 ENCOUNTER — Other Ambulatory Visit: Payer: Self-pay | Admitting: Family Medicine

## 2023-10-27 DIAGNOSIS — I25118 Atherosclerotic heart disease of native coronary artery with other forms of angina pectoris: Secondary | ICD-10-CM

## 2023-10-27 DIAGNOSIS — I10 Essential (primary) hypertension: Secondary | ICD-10-CM

## 2023-10-30 ENCOUNTER — Ambulatory Visit (INDEPENDENT_AMBULATORY_CARE_PROVIDER_SITE_OTHER): Admitting: Family Medicine

## 2023-10-30 ENCOUNTER — Encounter: Payer: Self-pay | Admitting: Family Medicine

## 2023-10-30 ENCOUNTER — Other Ambulatory Visit: Payer: Self-pay | Admitting: Family Medicine

## 2023-10-30 VITALS — BP 118/68 | HR 79 | Resp 16 | Ht 68.0 in | Wt 221.0 lb

## 2023-10-30 DIAGNOSIS — Z23 Encounter for immunization: Secondary | ICD-10-CM | POA: Diagnosis not present

## 2023-10-30 DIAGNOSIS — E1169 Type 2 diabetes mellitus with other specified complication: Secondary | ICD-10-CM

## 2023-10-30 DIAGNOSIS — I25118 Atherosclerotic heart disease of native coronary artery with other forms of angina pectoris: Secondary | ICD-10-CM | POA: Diagnosis not present

## 2023-10-30 DIAGNOSIS — E1140 Type 2 diabetes mellitus with diabetic neuropathy, unspecified: Secondary | ICD-10-CM

## 2023-10-30 DIAGNOSIS — G4733 Obstructive sleep apnea (adult) (pediatric): Secondary | ICD-10-CM

## 2023-10-30 DIAGNOSIS — Z794 Long term (current) use of insulin: Secondary | ICD-10-CM

## 2023-10-30 DIAGNOSIS — K219 Gastro-esophageal reflux disease without esophagitis: Secondary | ICD-10-CM

## 2023-10-30 DIAGNOSIS — I10 Essential (primary) hypertension: Secondary | ICD-10-CM

## 2023-10-30 DIAGNOSIS — E785 Hyperlipidemia, unspecified: Secondary | ICD-10-CM | POA: Diagnosis not present

## 2023-10-30 LAB — POCT GLYCOSYLATED HEMOGLOBIN (HGB A1C): Hemoglobin A1C: 6.5 % — AB (ref 4.0–5.6)

## 2023-10-30 LAB — GLUCOSE, POCT (MANUAL RESULT ENTRY): POC Glucose: 74 mg/dL (ref 70–99)

## 2023-10-30 MED ORDER — INSULIN PEN NEEDLE 32G X 6 MM MISC: 1.0000 | Freq: Every day | 1 refills | Status: AC

## 2023-10-30 MED ORDER — TOUJEO SOLOSTAR 300 UNIT/ML ~~LOC~~ SOPN: 40.0000 [IU] | PEN_INJECTOR | Freq: Every day | SUBCUTANEOUS | 1 refills | Status: DC

## 2023-10-30 MED ORDER — PREGABALIN 200 MG PO CAPS: 200.0000 mg | ORAL_CAPSULE | Freq: Every evening | ORAL | 0 refills | Status: DC

## 2023-10-30 MED ORDER — TIRZEPATIDE 10 MG/0.5ML ~~LOC~~ SOAJ: 10.0000 mg | SUBCUTANEOUS | 0 refills | Status: DC

## 2023-10-30 NOTE — Progress Notes (Signed)
 Name: Jesse Lawrence.   MRN: [id number]    DOB: Dec 07, 1960   Date:10/30/2023       Progress Note  Subjective  Chief Complaint  Chief Complaint  Patient presents with   Medical Management of Chronic Issues   Discussed the use of AI scribe software for clinical note transcription with the patient, who gave verbal consent to proceed.  History of Present Illness Jesse FORBES Garwin Mickey. Lawrence is a 63 year old male with type 2 diabetes, dyslipidemia, and neuropathy who presents for a follow-up visit.  He has experienced significant improvement in his diabetes management, with his A1c decreasing from 11.3% in October of last year to 6.5% currently. He uses a Freestyle continuous glucose monitor, noting an average glucose of 124 mg/dL over the past seven days. Glucose levels are slightly elevated at midnight. He experiences occasional lightheadedness, attributed to not eating for extended periods. His current medications include Basaglar  insulin  at 40 units daily, reduced from 50 units, Mounjaro  7.5 mg, metformin  1500 mg, pioglitazone  15 mg,   He has dyslipidemia and coronary artery disease, managed with atorvastatin  80 mg, fish oil, lisinopril  2.5 mg, metoprolol  25 mg, and Zetia . No current chest pain or palpitations are reported.  He experiences diabetic neuropathy and manages pain with pregabalin . Muscle cramps occur intermittently, associated with dehydration, especially in hot weather, and he uses pickle juice for relief.  He has obstructive sleep apnea but does not use a CPAP machine. Reflux is managed with over-the-counter famotidine  at night and pantoprazole  in the morning.  He reports numbness in his fingers and arms, associated with neck or shoulder issues, occurring when his arms are in certain positions, such as when reclining with hands behind his neck, symptoms improves when he shakes his arms.   He has a history of morbid  obesity but BMI is now below 35, with a goal to reduce his  weight further.    Patient Active Problem List   Diagnosis Date Noted   Intermittent low back pain 07/31/2023   Type 2 diabetes mellitus with diabetic neuropathy, with long-term current use of insulin  (HCC) 07/31/2023   History of colonic polyps 12/05/2022   Polyp of sigmoid colon 12/05/2022   Coronary artery disease of native artery of native heart with stable angina pectoris (HCC) 11/26/2018   OSA (obstructive sleep apnea) 05/21/2015   Perennial allergic rhinitis 11/07/2014   Arteriosclerosis of coronary artery 11/07/2014   CD (contact dermatitis) 11/07/2014   Decreased libido 11/07/2014   Diabetes mellitus with renal manifestation (HCC) 11/07/2014   Gastro-esophageal reflux disease without esophagitis 11/07/2014   H/O acute myocardial infarction 11/07/2014   Hypercholesteremia 11/07/2014   Benign hypertension 11/07/2014   Hypertriglyceridemia 11/07/2014   H/O high risk medication treatment 11/07/2014   NASH (nonalcoholic steatohepatitis) 11/07/2014   Microalbuminuria 11/07/2014   Adult BMI 30+ 11/07/2014   Fungal infection of toenail 11/07/2014   Tobacco abuse 11/07/2014   ED (erectile dysfunction) of organic origin 09/29/2006    Past Surgical History:  Procedure Laterality Date   cadiac stenting     CARDIAC CATHETERIZATION     Stents 2003 & 2004   COLONOSCOPY WITH PROPOFOL  N/A 12/05/2022   Procedure: COLONOSCOPY WITH PROPOFOL ;  Surgeon: Jinny Carmine, MD;  Location: [facility];  Service: Endoscopy;  Laterality: N/A;  Diabetic   KNEE ARTHROSCOPY  1996   PILONIDAL CYST EXCISION  1983   POLYPECTOMY  12/05/2022   Procedure: POLYPECTOMY;  Surgeon: Jinny Carmine, MD;  Location: [facility];  Service: Endoscopy;;    Family History  Problem Relation Age of Onset   Diabetes Mother    CAD Mother    Heart disease Mother    Heart attack Mother    Heart disease Father    Heart attack Father     Social History   Tobacco Use   Smoking status: Former     Current packs/day: 0.00    Average packs/day: 1.5 packs/day for 30.0 years (45.0 ttl pk-yrs)    Types: Cigarettes    Start date: 04/20/1983    Quit date: 04/19/2013    Years since quitting: 10.5    Passive exposure: Current   Smokeless tobacco: Current    Types: Snuff  Substance Use Topics   Alcohol use: No    Alcohol/week: 0.0 standard drinks of alcohol    Comment: occ     Current Outpatient Medications:    atorvastatin  (LIPITOR) 80 MG tablet, Take 1 tablet (80 mg total) by mouth daily., Disp: 90 tablet, Rfl: 1   clopidogrel  (PLAVIX ) 75 MG tablet, Take 1 tablet (75 mg total) by mouth daily., Disp: 90 tablet, Rfl: 1   Continuous Glucose Sensor (FREESTYLE LIBRE 3 PLUS SENSOR) MISC, 1 each by Other route as directed. Change sensor every 15 days., Disp: 6 each, Rfl: 1   empagliflozin  (JARDIANCE ) 25 MG TABS tablet, Take 1 tablet (25 mg total) by mouth daily., Disp: 90 tablet, Rfl: 1   ezetimibe  (ZETIA ) 10 MG tablet, Take 1 tablet (10 mg total) by mouth daily., Disp: 90 tablet, Rfl: 1   famotidine  (PEPCID ) 40 MG tablet, Take 1 tablet (40 mg total) by mouth at bedtime., Disp: 90 tablet, Rfl: 1   fluticasone  (FLONASE ) 50 MCG/ACT nasal spray, Place 2 sprays into both nostrils as needed., Disp: 48 g, Rfl: 1   glucose blood (ONETOUCH ULTRA) test strip, 100 each by Other route See admin instructions. Use as instructed, Disp: 100 strip, Rfl: 11   insulin  lispro (HUMALOG ) 100 UNIT/ML KwikPen, ADMINISTER 4 UNITS UNDER THE SKIN THREE TIMES DAILY, Disp: 9 mL, Rfl: 0   Insulin  Pen Needle 32G X 6 MM MISC, 1 each by Does not apply route daily at 2 PM., Disp: 100 each, Rfl: 1   lisinopril  (ZESTRIL ) 2.5 MG tablet, Take 1 tablet (2.5 mg total) by mouth daily., Disp: 90 tablet, Rfl: 1   loratadine  (CLARITIN ) 10 MG tablet, Take 1 tablet by mouth daily., Disp: , Rfl:    metaxalone  (SKELAXIN ) 800 MG tablet, Take 1 tablet (800 mg total) by mouth 3 (three) times daily., Disp: 90 tablet, Rfl: 1   metFORMIN   (GLUCOPHAGE -XR) 750 MG 24 hr tablet, Take 2 tablets (1,500 mg total) by mouth daily with breakfast., Disp: 180 tablet, Rfl: 1   metoprolol  succinate (TOPROL -XL) 25 MG 24 hr tablet, TAKE 1 TABLET (25 MG TOTAL) BY MOUTH DAILY., Disp: 30 tablet, Rfl: 0   omega-3 acid ethyl esters (LOVAZA ) 1 g capsule, Take 2 capsules (2 g total) by mouth 2 (two) times daily., Disp: 360 capsule, Rfl: 1   pantoprazole  (PROTONIX ) 40 MG tablet, Take 1 tablet (40 mg total) by mouth every morning., Disp: 90 tablet, Rfl: 1   pioglitazone  (ACTOS ) 15 MG tablet, TAKE 1 TAB BY MOUTH DAILY, Disp: 90 tablet, Rfl: 0   pregabalin  (LYRICA ) 200 MG capsule, Take 1 capsule (200 mg total) by mouth at bedtime., Disp: 90 capsule, Rfl: 0   sildenafil  (VIAGRA ) 100 MG tablet, TAKE A HALF TO 1 TABLET BY MOUTH AS NEEDED FOR ERECTILE  DYSFUNCTION, Disp: 30 tablet, Rfl: 0   tirzepatide  (MOUNJARO ) 7.5 MG/0.5ML Pen, INJECT 7.5 MG SUBCUTANEOUSLY WEEKLY, Disp: 2 mL, Rfl: 0   Glucagon  (GVOKE HYPOPEN  1-PACK) 1 MG/0.2ML SOAJ, Inject 1 each into the skin daily as needed. If glucose very low and unable to eat/drink (Patient not taking: Reported on 10/30/2023), Disp: 0.2 mL, Rfl: 1   Insulin  Glargine (BASAGLAR  KWIKPEN) 100 UNIT/ML, Inject 50 Units into the skin daily. (Patient not taking: Reported on 10/30/2023), Disp: 45 mL, Rfl: 0  No Known Allergies  I personally reviewed active problem list, medication list, allergies, family history with the patient/caregiver today.   ROS  Ten systems reviewed and is negative except as mentioned in HPI    Objective Physical Exam CONSTITUTIONAL: Patient appears well-developed and well-nourished.  No distress. HEENT: Head atraumatic, normocephalic, neck supple. CARDIOVASCULAR: Normal rate, regular rhythm and normal heart sounds.  No murmur heard. No BLE edema. PULMONARY: Effort normal and breath sounds normal. No respiratory distress. ABDOMINAL: There is no tenderness or distention. MUSCULOSKELETAL: Normal gait.  Without gross motor or sensory deficit. Negative Phalen's and Tinnel's test but positive symptoms when looking up ( neck extension ) PSYCHIATRIC: Patient has a normal mood and affect. behavior is normal. Judgment and thought content normal.  Vitals:   10/30/23 1453  BP: 118/68  Pulse: 79  Resp: 16  SpO2: 98%  Weight: 221 lb (100.2 kg)  Height: 5' 8 (1.727 m)    Body mass index is 33.6 kg/m.  Recent Results (from the past 2160 hours)  POCT HgB A1C     Status: Abnormal   Collection Time: 10/30/23  3:05 PM  Result Value Ref Range   Hemoglobin A1C 6.5 (A) 4.0 - 5.6 %   HbA1c POC (<> result, manual entry)     HbA1c, POC (prediabetic range)     HbA1c, POC (controlled diabetic range)    POCT Glucose (CBG)     Status: None   Collection Time: 10/30/23  3:05 PM  Result Value Ref Range   POC Glucose 74 70 - 99 mg/dl    Diabetic Foot Exam:     PHQ2/9:    07/31/2023    9:17 AM 04/24/2023   12:47 PM 01/16/2023    7:56 AM 10/07/2022    4:05 PM 09/29/2022    8:20 AM  Depression screen PHQ 2/9  Decreased Interest 0 0 0 0 0  Down, Depressed, Hopeless 0 0 0 0 0  PHQ - 2 Score 0 0 0 0 0  Altered sleeping 0 0 0 0 0  Tired, decreased energy 0 0 0 0 0  Change in appetite 0 0 0 0 0  Feeling bad or failure about yourself  0 0 0 0 0  Trouble concentrating 0 0 0 0 0  Moving slowly or fidgety/restless 0 0 0 0 0  Suicidal thoughts 0 0 0 0 0  PHQ-9 Score 0 0 0 0 0  Difficult doing work/chores Not difficult at all Not difficult at all  Not difficult at all     phq 9 is negative  Fall Risk:    04/24/2023   12:47 PM 01/16/2023    7:56 AM 10/07/2022    4:05 PM 09/29/2022    8:20 AM 06/27/2022   10:22 AM  Fall Risk   Falls in the past year? 0 0 0 0 0  Number falls in past yr: 0 0 0 0   Injury with Fall? 0 0 0 0   Risk for  fall due to : No Fall Risks No Fall Risks  No Fall Risks No Fall Risks  Follow up Falls prevention discussed;Education provided;Falls evaluation completed Falls  prevention discussed  Falls prevention discussed Falls prevention discussed      Assessment & Plan Type 2 diabetes mellitus with diabetic polyneuropathy and mixed hyperlipidemia Diabetes well-controlled with A1c 6.5%. Occasional midnight glucose spikes. Polyneuropathy managed with pregabalin . Dyslipidemia managed with atorvastatin  and fish oil. Insurance requires insulin  switch. - Continue continuous glucose monitoring. - Switch to Toujeo insulin  since it seems preferred formulary . - Adjust insulin  based on fasting glucose. - Increase Mounjaro  to 10 mg. - Continue metformin , pioglitazone , Jardiance . - Refill pregabalin . - Ensure hydration for cramps. - Administer PCV20 vaccine.  Coronary artery disease Well-managed, no recent angina. - Continue metoprolol  25 mg daily. - Continue atorvastatin  80 mg daily. - Continue ezetimibe .  Essential hypertension Controlled with current regimen. - Continue metoprolol  25 mg daily. - Continue lisinopril  2.5 mg daily.   Obesity BMI improved, below 35. - Continue weight management strategies.  Gastroesophageal reflux disease Controlled with pantoprazole  and famotidine . - Continue pantoprazole . - Continue famotidine  as needed.  Paresthesia upper extremity  Numbness likely due to cervical or shoulder issues, improves with movement. - Consider nerve conduction studies if symptoms worsen.  Muscle cramps Occasional cramps, possibly dehydration-related. - Ensure hydration, consider electrolyte supplements.

## 2023-11-05 ENCOUNTER — Other Ambulatory Visit: Payer: Self-pay | Admitting: Family Medicine

## 2023-11-05 DIAGNOSIS — N529 Male erectile dysfunction, unspecified: Secondary | ICD-10-CM

## 2023-11-08 ENCOUNTER — Other Ambulatory Visit: Payer: Self-pay | Admitting: Family Medicine

## 2023-11-08 DIAGNOSIS — I10 Essential (primary) hypertension: Secondary | ICD-10-CM

## 2023-11-08 DIAGNOSIS — I25118 Atherosclerotic heart disease of native coronary artery with other forms of angina pectoris: Secondary | ICD-10-CM

## 2023-11-08 DIAGNOSIS — E1169 Type 2 diabetes mellitus with other specified complication: Secondary | ICD-10-CM

## 2023-11-08 DIAGNOSIS — K7581 Nonalcoholic steatohepatitis (NASH): Secondary | ICD-10-CM

## 2023-12-04 ENCOUNTER — Other Ambulatory Visit: Payer: Self-pay | Admitting: Family Medicine

## 2023-12-04 DIAGNOSIS — N529 Male erectile dysfunction, unspecified: Secondary | ICD-10-CM

## 2023-12-20 ENCOUNTER — Other Ambulatory Visit: Payer: Self-pay | Admitting: Family Medicine

## 2023-12-20 DIAGNOSIS — I25118 Atherosclerotic heart disease of native coronary artery with other forms of angina pectoris: Secondary | ICD-10-CM

## 2023-12-20 DIAGNOSIS — I10 Essential (primary) hypertension: Secondary | ICD-10-CM

## 2024-01-02 ENCOUNTER — Other Ambulatory Visit: Payer: Self-pay | Admitting: Family Medicine

## 2024-01-02 DIAGNOSIS — N529 Male erectile dysfunction, unspecified: Secondary | ICD-10-CM

## 2024-01-04 ENCOUNTER — Other Ambulatory Visit: Payer: Self-pay | Admitting: Family Medicine

## 2024-01-04 DIAGNOSIS — K7581 Nonalcoholic steatohepatitis (NASH): Secondary | ICD-10-CM

## 2024-01-04 DIAGNOSIS — E1169 Type 2 diabetes mellitus with other specified complication: Secondary | ICD-10-CM

## 2024-01-08 ENCOUNTER — Ambulatory Visit: Payer: Self-pay

## 2024-01-08 ENCOUNTER — Telehealth: Payer: Self-pay

## 2024-01-08 NOTE — Telephone Encounter (Signed)
 Copied from CRM [id number]. Topic: Clinical - Red Word Triage >> Jan 08, 2024  1:54 PM Pinkey ORN wrote: Red Word that prompted transfer to Nurse Triage: Rash / Reaction >> Jan 08, 2024  1:55 PM Pinkey ORN wrote: Patient states he does tirzepatide  (MOUNJARO ) 10 MG/0.5ML Pen injections every month, and these last 2 injections has caused his to break out at the injection site (stomach).  Reason for Disposition  [1] Caller has NON-URGENT medicine question about med that PCP prescribed AND [2] triager unable to answer question  Answer Assessment - Initial Assessment Questions 1. NAME of MEDICINE: What medicine(s) are you calling about?     Mounjaro   2. QUESTION: What is your question? (e.g., double dose of medicine, side effect)     Onset of rash around injection site when taking Monjauro. Onset with injection last week, and again this week. Increased from 7.5 mg to 10 mg on 7/25. Has not missed any doses or started any new medications.  3. PRESCRIBER: Who prescribed the medicine? Reason: if prescribed by specialist, call should be referred to that group.     Dr. Glenard.  4. SYMPTOMS: Do you have any symptoms? If Yes, ask: What symptoms are you having?  How bad are the symptoms (e.g., mild, moderate, severe)     3 inch diameter rash around injection site. Itchy, red. No drainage. No fever, no SOB.  Protocols used: Medication Question Call-A-AH

## 2024-01-08 NOTE — Telephone Encounter (Signed)
 Pt called back about a nurse triage that was not sent to us ?  Please advise, told him it may be Monday when we return call:  Copied from CRM #[id number]. Topic: Clinical - Red Word Triage >> Jan 08, 2024  1:54 PM Pinkey ORN wrote: Red Word that prompted transfer to Nurse Triage: Rash / Reaction >> Jan 08, 2024  1:55 PM Pinkey ORN wrote: Patient states he does tirzepatide  (MOUNJARO ) 10 MG/0.5ML Pen injections every month, and these last 2 injections has caused his to break out at the injection site (stomach).  Reason for Disposition  [1] Caller has NON-URGENT medicine question about med that PCP prescribed AND [2] triager unable to answer question  Answer Assessment - Initial Assessment Questions 1. NAME of MEDICINE: What medicine(s) are you calling about?     Mounjaro   2. QUESTION: What is your question? (e.g., double dose of medicine, side effect)     Onset of rash around injection site when taking Monjauro. Onset with injection last week, and again this week. Increased from 7.5 mg to 10 mg on 7/25. Has not missed any doses or started any new medications.  3. PRESCRIBER: Who prescribed the medicine? Reason: if prescribed by specialist, call should be referred to that group.     Dr. Glenard.  4. SYMPTOMS: Do you have any symptoms? If Yes, ask: What symptoms are you having?  How bad are the symptoms (e.g., mild, moderate, severe)     3 inch diameter rash around injection site. Itchy, red. No drainage. No fever, no SOB.  Protocols used: Medication Question Call-A-AH

## 2024-01-11 NOTE — Telephone Encounter (Signed)
 So you still wanit him to continue as long as he can tolerate correct?

## 2024-01-12 ENCOUNTER — Other Ambulatory Visit: Payer: Self-pay | Admitting: Family Medicine

## 2024-01-12 MED ORDER — SEMAGLUTIDE (2 MG/DOSE) 8 MG/3ML ~~LOC~~ SOPN: 2.0000 mg | PEN_INJECTOR | SUBCUTANEOUS | 0 refills | Status: DC

## 2024-01-12 NOTE — Telephone Encounter (Signed)
 Ok he said yes, so you want me to put in for ozempic  2mg ?

## 2024-01-12 NOTE — Telephone Encounter (Signed)
 Pt no longer wants to take, states still itching and has even used/tried benadryl cream with no relief.  Please advise

## 2024-01-27 ENCOUNTER — Other Ambulatory Visit: Payer: Self-pay | Admitting: Emergency Medicine

## 2024-01-27 DIAGNOSIS — E119 Type 2 diabetes mellitus without complications: Secondary | ICD-10-CM

## 2024-01-27 DIAGNOSIS — E1140 Type 2 diabetes mellitus with diabetic neuropathy, unspecified: Secondary | ICD-10-CM

## 2024-01-30 ENCOUNTER — Other Ambulatory Visit: Payer: Self-pay | Admitting: Family Medicine

## 2024-01-30 DIAGNOSIS — E1140 Type 2 diabetes mellitus with diabetic neuropathy, unspecified: Secondary | ICD-10-CM

## 2024-01-30 DIAGNOSIS — N529 Male erectile dysfunction, unspecified: Secondary | ICD-10-CM

## 2024-02-10 ENCOUNTER — Other Ambulatory Visit: Payer: Self-pay | Admitting: Family Medicine

## 2024-02-21 ENCOUNTER — Other Ambulatory Visit: Payer: Self-pay | Admitting: Family Medicine

## 2024-02-21 DIAGNOSIS — I10 Essential (primary) hypertension: Secondary | ICD-10-CM

## 2024-02-21 DIAGNOSIS — I25118 Atherosclerotic heart disease of native coronary artery with other forms of angina pectoris: Secondary | ICD-10-CM

## 2024-02-23 ENCOUNTER — Other Ambulatory Visit: Payer: Self-pay | Admitting: Family Medicine

## 2024-02-23 DIAGNOSIS — I10 Essential (primary) hypertension: Secondary | ICD-10-CM

## 2024-02-23 DIAGNOSIS — I25118 Atherosclerotic heart disease of native coronary artery with other forms of angina pectoris: Secondary | ICD-10-CM

## 2024-02-23 NOTE — Telephone Encounter (Signed)
 Requested Prescriptions  Pending Prescriptions Disp Refills   metoprolol  succinate (TOPROL -XL) 25 MG 24 hr tablet [Pharmacy Med Name: METOPROLOL  SUCC ER 25 MG TAB] 90 tablet 0    Sig: TAKE 1 TABLET (25 MG TOTAL) BY MOUTH DAILY.     Cardiovascular:  Beta Blockers Passed - 02/23/2024  1:54 PM      Passed - Last BP in normal range    BP Readings from Last 1 Encounters:  10/30/23 118/68         Passed - Last Heart Rate in normal range    Pulse Readings from Last 1 Encounters:  10/30/23 79         Passed - Valid encounter within last 6 months    Recent Outpatient Visits           3 months ago Dyslipidemia associated with type 2 diabetes mellitus [facility])   [place] [facility] Glenard Mire, MD   6 months ago Dyslipidemia associated with type 2 diabetes mellitus [facility])   [place] [facility] [place] Sowles, Krichna, MD       Future Appointments             In 1 week Sowles, Krichna, MD [facility], [place]   In 1 week Gerard Frederick, NP [facility] [place]

## 2024-02-23 NOTE — Telephone Encounter (Unsigned)
 Copied from CRM [id number]. Topic: Clinical - Medication Refill >> Feb 23, 2024  5:16 PM Zebedee SAUNDERS wrote: Medication: empagliflozin  (JARDIANCE ) 25 MG TABS tablet, lisinopril  (ZESTRIL ) 2.5 MG tablet, metFORMIN  (GLUCOPHAGE -XR) 750 MG 24 hr tablet,   Has the patient contacted their pharmacy? Yes (Agent: If no, request that the patient contact the pharmacy for the refill. If patient does not wish to contact the pharmacy document the reason why and proceed with request.) (Agent: If yes, when and what did the pharmacy advise?)  This is the patient's preferred pharmacy:  [phone], [place] - [address] [place] [address] Phone: [phone] Fax: [phone]  Is this the correct pharmacy for this prescription? Yes If no, delete pharmacy and type the correct one.   Has the prescription been filled recently? Yes  Is the patient out of the medication? Yes  Has the patient been seen for an appointment in the last year OR does the patient have an upcoming appointment? Yes  Can we respond through MyChart? Yes  Agent: Please be advised that Rx refills may take up to 3 business days. We ask that you follow-up with your pharmacy.

## 2024-02-25 ENCOUNTER — Other Ambulatory Visit: Payer: Self-pay | Admitting: Family Medicine

## 2024-02-25 DIAGNOSIS — R809 Proteinuria, unspecified: Secondary | ICD-10-CM

## 2024-02-25 DIAGNOSIS — E1169 Type 2 diabetes mellitus with other specified complication: Secondary | ICD-10-CM

## 2024-02-26 NOTE — Telephone Encounter (Signed)
 Requested medications are due for refill today.  yes  Requested medications are on the active medications list.  yes  Last refill. 07/31/2023 #6 1 rf  Future visit scheduled.   yes  Notes to clinic.  Protocol will not attach. Please review for refill    Requested Prescriptions  Pending Prescriptions Disp Refills   Continuous Glucose Sensor (FREESTYLE LIBRE 3 PLUS SENSOR) MISC [Pharmacy Med Name: FREESTYLE LIBRE 3 PLUS SENSOR] 2 each 5    Sig: 1 each by Other route as directed. Change sensor every 15 days.     There is no refill protocol information for this order

## 2024-02-26 NOTE — Telephone Encounter (Signed)
 Requested medications are due for refill today.  yes  Requested medications are on the active medications list.  yes  Last refill. 07/31/2023 6 month supply  Future visit scheduled.   yes  Notes to clinic.  Labs are expired.    Requested Prescriptions  Pending Prescriptions Disp Refills   empagliflozin  (JARDIANCE ) 25 MG TABS tablet 90 tablet 1    Sig: Take 1 tablet (25 mg total) by mouth daily.     Endocrinology:  Diabetes - SGLT2 Inhibitors Failed - 02/26/2024 11:12 AM      Failed - Cr in normal range and within 360 days    Creat  Date Value Ref Range Status  01/16/2023 0.70 0.70 - 1.35 mg/dL Final   Creatinine, Urine  Date Value Ref Range Status  01/16/2023 99 20 - 320 mg/dL Final         Failed - eGFR in normal range and within 360 days    GFR, Est African American  Date Value Ref Range Status  08/13/2020 121 > OR = 60 mL/min/1.20m2 Final   GFR, Est Non African American  Date Value Ref Range Status  08/13/2020 104 > OR = 60 mL/min/1.32m2 Final   eGFR  Date Value Ref Range Status  01/16/2023 105 > OR = 60 mL/min/1.51m2 Final         Passed - HBA1C is between 0 and 7.9 and within 180 days    Hemoglobin A1C  Date Value Ref Range Status  10/30/2023 6.5 (A) 4.0 - 5.6 % Final   HbA1c, POC (controlled diabetic range)  Date Value Ref Range Status  11/26/2018 5.7 0.0 - 7.0 % Final   Hgb A1c MFr Bld  Date Value Ref Range Status  06/06/2021 8.3 (H) <5.7 % of total Hgb Final    Comment:    For someone without known diabetes, a hemoglobin A1c value of 6.5% or greater indicates that they may have  diabetes and this should be confirmed with a follow-up  test. . For someone with known diabetes, a value <7% indicates  that their diabetes is well controlled and a value  greater than or equal to 7% indicates suboptimal  control. A1c targets should be individualized based on  duration of diabetes, age, comorbid conditions, and  other considerations. . Currently, no  consensus exists regarding use of hemoglobin A1c for diagnosis of diabetes for children. SABRA Amy - Valid encounter within last 6 months    Recent Outpatient Visits           3 months ago Dyslipidemia associated with type 2 diabetes mellitus [facility])   [place] [facility] Glenard Mire, MD   7 months ago Dyslipidemia associated with type 2 diabetes mellitus [facility])   [place] [facility] Glenard Mire, MD       Future Appointments             In 5 days Sowles, Krichna, MD [facility], [place]   In 5 days Gerard Frederick, NP  [facility] at [facility]             lisinopril  (ZESTRIL ) 2.5 MG tablet 90 tablet 1    Sig: Take 1 tablet (2.5 mg total) by mouth daily.     Cardiovascular:  ACE Inhibitors Failed - 02/26/2024 11:12 AM      Failed - Cr in normal range and within 180 days    Creat  Date Value Ref  Range Status  01/16/2023 0.70 0.70 - 1.35 mg/dL Final   Creatinine, Urine  Date Value Ref Range Status  01/16/2023 99 20 - 320 mg/dL Final         Failed - K in normal range and within 180 days    Potassium  Date Value Ref Range Status  01/16/2023 4.2 3.5 - 5.3 mmol/L Final  08/02/2011 4.0 3.5 - 5.1 mmol/L Final         Passed - Patient is not pregnant      Passed - Last BP in normal range    BP Readings from Last 1 Encounters:  10/30/23 118/68         Passed - Valid encounter within last 6 months    Recent Outpatient Visits           3 months ago Dyslipidemia associated with type 2 diabetes mellitus (HCC)   [place] [facility] Glenard Mire, MD   7 months ago Dyslipidemia associated with type 2 diabetes mellitus [facility])   [place] [facility] Glenard Mire, MD       Future Appointments             In 5 days Sowles, Krichna, MD [facility], [place]   In 5 days Gerard Frederick, NP [place]  [facility] at [facility]             metFORMIN  (GLUCOPHAGE -XR) 750 MG 24 hr tablet 180 tablet 1    Sig: Take 2 tablets (1,500 mg total) by mouth daily with breakfast.     Endocrinology:  Diabetes - Biguanides Failed - 02/26/2024 11:12 AM      Failed - Cr in normal range and within 360 days    Creat  Date Value Ref Range Status  01/16/2023 0.70 0.70 - 1.35 mg/dL Final   Creatinine, Urine  Date Value Ref Range Status  01/16/2023 99 20 - 320 mg/dL Final         Failed - eGFR in normal range and within 360 days    GFR, Est African American  Date Value Ref Range Status  08/13/2020 121 > OR = 60 mL/min/1.7m2 Final   GFR, Est Non African American  Date Value Ref Range Status  08/13/2020 104 > OR = 60 mL/min/1.63m2 Final   eGFR  Date Value Ref Range Status  01/16/2023 105 > OR = 60 mL/min/1.26m2 Final         Failed - CBC within normal limits and completed in the last 12 months    WBC  Date Value Ref Range Status  01/16/2023 6.1 3.8 - 10.8 Thousand/uL Final   RBC  Date Value Ref Range Status  01/16/2023 4.98 4.20 - 5.80 Million/uL Final   Hemoglobin  Date Value Ref Range Status  01/16/2023 15.7 13.2 - 17.1 g/dL Final   HCT  Date Value Ref Range Status  01/16/2023 46.9 38.5 - 50.0 % Final   MCHC  Date Value Ref Range Status  01/16/2023 33.5 32.0 - 36.0 g/dL Final    Comment:    For adults, a slight decrease in the calculated MCHC value (in the range of 30 to 32 g/dL) is most likely not clinically significant; however, it should be interpreted with caution in correlation with other red cell parameters and the patient's clinical condition.    [facility]  Date Value Ref Range Status  01/16/2023 31.5 27.0 - 33.0 pg Final   MCV  Date Value Ref Range Status  01/16/2023  94.2 80.0 - 100.0 fL Final   No results found for: PLTCOUNTKUC, LABPLAT, POCPLA RDW  Date Value Ref Range Status  01/16/2023 11.6 11.0 - 15.0 % Final         Passed - HBA1C is between 0  and 7.9 and within 180 days    Hemoglobin A1C  Date Value Ref Range Status  10/30/2023 6.5 (A) 4.0 - 5.6 % Final   HbA1c, POC (controlled diabetic range)  Date Value Ref Range Status  11/26/2018 5.7 0.0 - 7.0 % Final   Hgb A1c MFr Bld  Date Value Ref Range Status  06/06/2021 8.3 (H) <5.7 % of total Hgb Final    Comment:    For someone without known diabetes, a hemoglobin A1c value of 6.5% or greater indicates that they may have  diabetes and this should be confirmed with a follow-up  test. . For someone with known diabetes, a value <7% indicates  that their diabetes is well controlled and a value  greater than or equal to 7% indicates suboptimal  control. A1c targets should be individualized based on  duration of diabetes, age, comorbid conditions, and  other considerations. . Currently, no consensus exists regarding use of hemoglobin A1c for diagnosis of diabetes for children. .          Passed - B12 Level in normal range and within 720 days    Vitamin B-12  Date Value Ref Range Status  01/16/2023 913 200 - 1,100 pg/mL Final         Passed - Valid encounter within last 6 months    Recent Outpatient Visits           3 months ago Dyslipidemia associated with type 2 diabetes mellitus [facility])   [place] [facility] Sowles, Krichna, MD   7 months ago Dyslipidemia associated with type 2 diabetes mellitus [facility])   [place] [facility] Sowles, Krichna, MD       Future Appointments             In 5 days Sowles, Krichna, MD [facility], [place]   In 5 days Gerard Frederick, NP [place] [facility]

## 2024-02-28 ENCOUNTER — Other Ambulatory Visit: Payer: Self-pay | Admitting: Family Medicine

## 2024-02-28 DIAGNOSIS — N529 Male erectile dysfunction, unspecified: Secondary | ICD-10-CM

## 2024-02-29 NOTE — Telephone Encounter (Signed)
 Second request

## 2024-03-01 NOTE — Patient Instructions (Incomplete)

## 2024-03-02 ENCOUNTER — Ambulatory Visit: Attending: Cardiology | Admitting: Cardiology

## 2024-03-02 ENCOUNTER — Ambulatory Visit: Admitting: Family Medicine

## 2024-03-02 ENCOUNTER — Other Ambulatory Visit: Payer: Self-pay | Admitting: Family Medicine

## 2024-03-02 ENCOUNTER — Encounter: Payer: Self-pay | Admitting: Family Medicine

## 2024-03-02 ENCOUNTER — Encounter: Payer: Self-pay | Admitting: Cardiology

## 2024-03-02 VITALS — BP 130/74 | HR 87 | Resp 16 | Ht 68.0 in | Wt 222.5 lb

## 2024-03-02 VITALS — BP 110/64 | HR 71 | Ht 68.0 in | Wt 222.2 lb

## 2024-03-02 DIAGNOSIS — K219 Gastro-esophageal reflux disease without esophagitis: Secondary | ICD-10-CM

## 2024-03-02 DIAGNOSIS — E1121 Type 2 diabetes mellitus with diabetic nephropathy: Secondary | ICD-10-CM | POA: Diagnosis not present

## 2024-03-02 DIAGNOSIS — Z0001 Encounter for general adult medical examination with abnormal findings: Secondary | ICD-10-CM

## 2024-03-02 DIAGNOSIS — Z23 Encounter for immunization: Secondary | ICD-10-CM

## 2024-03-02 DIAGNOSIS — Z794 Long term (current) use of insulin: Secondary | ICD-10-CM

## 2024-03-02 DIAGNOSIS — E785 Hyperlipidemia, unspecified: Secondary | ICD-10-CM

## 2024-03-02 DIAGNOSIS — I10 Essential (primary) hypertension: Secondary | ICD-10-CM

## 2024-03-02 DIAGNOSIS — I25118 Atherosclerotic heart disease of native coronary artery with other forms of angina pectoris: Secondary | ICD-10-CM | POA: Diagnosis not present

## 2024-03-02 DIAGNOSIS — E1140 Type 2 diabetes mellitus with diabetic neuropathy, unspecified: Secondary | ICD-10-CM

## 2024-03-02 DIAGNOSIS — E1169 Type 2 diabetes mellitus with other specified complication: Secondary | ICD-10-CM | POA: Diagnosis not present

## 2024-03-02 DIAGNOSIS — K7581 Nonalcoholic steatohepatitis (NASH): Secondary | ICD-10-CM

## 2024-03-02 DIAGNOSIS — Z0000 Encounter for general adult medical examination without abnormal findings: Secondary | ICD-10-CM

## 2024-03-02 DIAGNOSIS — N401 Enlarged prostate with lower urinary tract symptoms: Secondary | ICD-10-CM

## 2024-03-02 DIAGNOSIS — E119 Type 2 diabetes mellitus without complications: Secondary | ICD-10-CM | POA: Insufficient documentation

## 2024-03-02 DIAGNOSIS — E669 Obesity, unspecified: Secondary | ICD-10-CM

## 2024-03-02 DIAGNOSIS — Z79899 Other long term (current) drug therapy: Secondary | ICD-10-CM

## 2024-03-02 DIAGNOSIS — Z87891 Personal history of nicotine dependence: Secondary | ICD-10-CM

## 2024-03-02 LAB — PSA: PSA: 1.8 ng/mL (ref <=4.00)

## 2024-03-02 MED ORDER — ATORVASTATIN CALCIUM 80 MG PO TABS: 80.0000 mg | ORAL_TABLET | Freq: Every day | ORAL | 1 refills | Status: AC

## 2024-03-02 MED ORDER — FREESTYLE LIBRE 3 PLUS SENSOR MISC: 1.0000 | 1 refills | Status: AC

## 2024-03-02 MED ORDER — METFORMIN HCL ER 750 MG PO TB24: 1500.0000 mg | ORAL_TABLET | Freq: Every day | ORAL | 1 refills | Status: AC

## 2024-03-02 MED ORDER — TAMSULOSIN HCL 0.4 MG PO CAPS: 0.4000 mg | ORAL_CAPSULE | Freq: Every day | ORAL | 3 refills | Status: AC

## 2024-03-02 MED ORDER — PANTOPRAZOLE SODIUM 40 MG PO TBEC: 40.0000 mg | DELAYED_RELEASE_TABLET | ORAL | 1 refills | Status: AC

## 2024-03-02 MED ORDER — OZEMPIC (2 MG/DOSE) 8 MG/3ML ~~LOC~~ SOPN: 2.0000 mg | PEN_INJECTOR | SUBCUTANEOUS | 1 refills | Status: DC

## 2024-03-02 MED ORDER — CLOPIDOGREL BISULFATE 75 MG PO TABS: 75.0000 mg | ORAL_TABLET | Freq: Every day | ORAL | 1 refills | Status: AC

## 2024-03-02 MED ORDER — PREGABALIN 200 MG PO CAPS: 200.0000 mg | ORAL_CAPSULE | Freq: Every day | ORAL | 1 refills | Status: DC

## 2024-03-02 MED ORDER — LISINOPRIL 2.5 MG PO TABS: 2.5000 mg | ORAL_TABLET | Freq: Every day | ORAL | 1 refills | Status: AC

## 2024-03-02 MED ORDER — FAMOTIDINE 40 MG PO TABS: 40.0000 mg | ORAL_TABLET | Freq: Every day | ORAL | 1 refills | Status: DC

## 2024-03-02 MED ORDER — EZETIMIBE 10 MG PO TABS: 10.0000 mg | ORAL_TABLET | Freq: Every day | ORAL | 1 refills | Status: AC

## 2024-03-02 MED ORDER — PIOGLITAZONE HCL 15 MG PO TABS: 15.0000 mg | ORAL_TABLET | Freq: Every day | ORAL | 1 refills | Status: AC

## 2024-03-02 MED ORDER — EMPAGLIFLOZIN 25 MG PO TABS: 25.0000 mg | ORAL_TABLET | Freq: Every day | ORAL | 1 refills | Status: AC

## 2024-03-02 MED ORDER — INSULIN LISPRO (1 UNIT DIAL) 100 UNIT/ML (KWIKPEN): 4.0000 [IU] | PEN_INJECTOR | Freq: Three times a day (TID) | SUBCUTANEOUS | 0 refills | Status: DC

## 2024-03-02 MED ORDER — METOPROLOL SUCCINATE ER 25 MG PO TB24: 25.0000 mg | ORAL_TABLET | Freq: Every day | ORAL | 0 refills | Status: AC

## 2024-03-02 MED ORDER — TOUJEO SOLOSTAR 300 UNIT/ML ~~LOC~~ SOPN: 40.0000 [IU] | PEN_INJECTOR | Freq: Every day | SUBCUTANEOUS | 1 refills | Status: DC

## 2024-03-02 MED ORDER — OMEGA-3-ACID ETHYL ESTERS 1 G PO CAPS: 2.0000 g | ORAL_CAPSULE | Freq: Two times a day (BID) | ORAL | 1 refills | Status: AC

## 2024-03-02 NOTE — Patient Instructions (Signed)
 Medication Instructions:  Your physician recommends that you continue on your current medications as directed. Please refer to the Current Medication list given to you today.   *If you need a refill on your cardiac medications before your next appointment, please call your pharmacy*  Lab Work: No labs ordered today  If you have labs (blood work) drawn today and your tests are completely normal, you will receive your results only by: MyChart Message (if you have MyChart) OR A paper copy in the mail If you have any lab test that is abnormal or we need to change your treatment, we will call you to review the results.  Testing/Procedures: No test ordered today   Follow-Up: At [facility], you and your health needs are our priority.  As part of our continuing mission to provide you with exceptional heart care, our providers are all part of one team.  This team includes your primary Cardiologist (physician) and Advanced Practice Providers or APPs (Physician Assistants and Nurse Practitioners) who all work together to provide you with the care you need, when you need it.  Your next appointment:   12 month(s)  Provider:   You may see Timothy Gollan, MD or one of the following Advanced Practice Providers on your designated Care Team:   Tylene Lunch, NP

## 2024-03-02 NOTE — Progress Notes (Signed)
 Cardiology Office Note   Date:  03/02/2024  ID:  Jesse FORBES Garwin Mickey., DOB Sep 10, 1960, MRN [id number] PCP: Sowles, Krichna, MD  [place] [facility] Providers Cardiologist:  Evalene Lunger, MD     History of Present Illness Jesse Lawrence. is a 63 y.o. male with a past medical history of coronary artery disease (LHC [id number]), ISR in 2007 (tPA the cath [place]), hypertension, GERD, COPD, type 2 diabetes, who presents today for follow-up of his coronary artery disease.   Prior history of coronary artery disease persistent x 2 in 2002 and 2023 that he suffered from in-stent restenosis in 2007 and received tPA 3 days post cath.  He has been following with Dr. Gollan since October 2021.  He had followed with Dr. Gollan routinely and was last seen by him in February 2023.  At that time he was doing well denies significant shortness of breath or chest pain.  He reported some breathlessness while pulling his trash uphill and felt it was related to deconditioning.  He was last seen in clinic 09/26/2022.  He reported overall doing very well.  He recently returned from vacation in [place] .  He was concerned about knee pain and plans discussed with his PCP on Monday.  He was continued on his current medication regimen without changes being made.  There was no further testing that was ordered.   He returns to clinic today stating that he has been doing well from a cardiac perspective.  He denies any chest pain, shortness of breath, palpitations, lightheadedness or dizziness, or peripheral edema.  He states that he recently was evaluated by his PCP with blood work drawn today.  States that he has been compliant with his current medication regimen without any undue side effects.  Denies any hospitalizations or visits to the emergency department.  ROS: 10 point review of system has been reviewed and considered negative with exception was listed in the HPI  Studies Reviewed EKG  Interpretation Date/Time:  Wednesday March 02 2024 09:57:19 EST Ventricular Rate:  71 PR Interval:  140 QRS Duration:  80 QT Interval:  366 QTC Calculation: 397 R Axis:   60  Text Interpretation: Normal sinus rhythm Normal ECG When compared with ECG of 09-Jan-2020 16:10, Premature supraventricular complexes are no longer Present ST elevation now present in Anterior leads (noted since 2014) Confirmed by Gerard Frederick ([id number]) on 03/02/2024 10:04:07 AM    Risk Assessment/Calculations           Physical Exam VS:  BP 110/64 (BP Location: Left Arm, Patient Position: Sitting, Cuff Size: Large)   Pulse 71   Ht 5' 8 (1.727 m)   Wt 222 lb 4 oz (100.8 kg)   SpO2 95%   BMI 33.79 kg/m        Wt Readings from Last 3 Encounters:  03/02/24 222 lb 4 oz (100.8 kg)  03/02/24 222 lb 8 oz (100.9 kg)  10/30/23 221 lb (100.2 kg)    GEN: Well nourished, well developed in no acute distress NECK: No JVD; No carotid bruits CARDIAC: RRR, no murmurs, rubs, gallops RESPIRATORY:  Clear to auscultation without rales, wheezing or rhonchi  ABDOMEN: Soft, non-tender, non-distended EXTREMITIES:  No edema; No deformity   ASSESSMENT AND PLAN Coronary artery disease with cardiac catheterization and stent placement in 2002 and 2003.  He had stent reocclusion in 2007.  Continues to deny any anginal or anginal equivalents.  Reports that he has been doing well.  EKG today reveals a sinus rhythm  with a rate of 71 with no acute ischemic changes noted.  He has been continued on clopidogrel  75 mg daily atorvastatin  80 mg daily ezetimibe  10 mg daily.  No further ischemic evaluation is needed at this time.  Hypertension with a blood pressure today 110/64.  Blood pressure has been well-controlled.  He has been continued on home lisinopril  2.5 mg daily and Toprol -XL 25 mg daily.  He has been encouraged to continue to monitor his pressures 1 to 2 hours postmedication administration as well.  Dyslipidemia with last LDL of  51.  He had labs were completed this morning with his PCP.  He has been continued on atorvastatin  80 mg daily as he is below goal of 55.  As well as ezetimibe  10 mg daily.  Type 2 diabetes with a hemoglobin A1c of 6.5.  He is continued on his diabetes medication with ongoing management per his PCP.       Dispo: Patient to return to clinic see MD/APP in 11 to 12 months or sooner if needed for further evaluation.  Signed, Merrill Deanda, NP

## 2024-03-02 NOTE — Progress Notes (Addendum)
 Name: [name].   MRN: [id number]    DOB: June 18, 1960   Date:03/02/2024       Progress Note  Subjective  Chief Complaint  Chief Complaint  Patient presents with   Annual Exam    HPI  Patient presents for annual CPE and follow up and follow up  Discussed the use of AI scribe software for clinical note transcription with the patient, who gave verbal consent to proceed.  History of Present Illness Jesse Lawrence Jesse Lawrence. Jesse Lawrence is a 63 year old male with diabetes, hypertension, coronary artery disease, and dyslipidemia who presents for a follow-up visit and annual physical exam.  He is on the highest dose of Ozempic  at 2 mg, uses Toujeo  at 30 units in the morning, and premium insulin  as needed, following a sliding scale. His morning blood sugar levels range from 120 to 130 mg/dL, with an increase upon waking. He has been out of metformin  and Jardiance  for two and a half weeks but continues to take pioglitazone  50 mg. His 90-day average blood sugar is 135 mg/dL. He experiences frequent urination, increased thirst, and neuropathy symptoms. Stress and dietary changes due to his sister's passing may be impacting his diabetes management.  He has a history of hypertension and is currently taking lisinopril  and metoprolol . No chest pain or palpitations.  He has coronary artery disease and is on Plavix , ezetimibe , atorvastatin  80 mg, and Lovaza . He has two stents and no history of bypass surgery. No chest pain or muscle aches.  He experiences neuropathy in his legs and takes Lyrica  (pregabalin ) for this condition. He describes a burning sensation from the bottom of his back to his shoulders, which he likens to shingles. He takes pregabalin  once a day.  He has a history of obesity with a BMI of 30. His weight has remained stable since July. He reports eating more candy recently due to stress from his sister's passing.  He wakes up with headaches, which he attributes to sinus issues. He uses  Flonase  and Claritin  for sinus relief. He has sleep apnea.  He quit smoking at age 61 and now uses smokeless tobacco. He smoked a pack and a half a day before quitting. He walks approximately seven miles a day at work but does not engage in additional physical activity outside of work.  No alcohol use or history of STDs. He has a fungal infection and a history of large prostate, for which he is considering medication to help with urinary flow. No history of NASH or hepatitis and no liver-related symptoms.       IPSS     Row Name 03/02/24 0809         International Prostate Symptom Score   How often have you had the sensation of not emptying your bladder? Not at All     How often have you had to urinate less than every two hours? Not at All     How often have you found you stopped and started again several times when you urinated? Less than half the time     How often have you found it difficult to postpone urination? Less than half the time     How often have you had a weak urinary stream? Less than half the time     How often have you had to strain to start urination? Not at All     How many times did you typically get up at night to urinate? 1 Time  Total IPSS Score 7       Quality of Life due to urinary symptoms   If you were to spend the rest of your life with your urinary condition just the way it is now how would you feel about that? Pleased        Diet: eating more candy Exercise: needs to resume regular physical activity besides work  Last Dental Exam: needs to see dentist  Last Eye Exam: up to date  Depression: phq 9 is negative    03/02/2024    8:01 AM 07/31/2023    9:17 AM 04/24/2023   12:47 PM 01/16/2023    7:56 AM 10/07/2022    4:05 PM  Depression screen PHQ 2/9  Decreased Interest 0 0 0 0 0  Down, Depressed, Hopeless 0 0 0 0 0  PHQ - 2 Score 0 0 0 0 0  Altered sleeping  0 0 0 0  Tired, decreased energy  0 0 0 0  Change in appetite  0 0 0 0  Feeling bad or  failure about yourself   0 0 0 0  Trouble concentrating  0 0 0 0  Moving slowly or fidgety/restless  0 0 0 0  Suicidal thoughts  0 0 0 0  PHQ-9 Score  0  0  0  0   Difficult doing work/chores  Not difficult at all Not difficult at all  Not difficult at all     Data saved with a previous flowsheet row definition    Hypertension:  BP Readings from Last 3 Encounters:  03/02/24 130/74  10/30/23 118/68  07/31/23 120/76    Obesity: Wt Readings from Last 3 Encounters:  03/02/24 222 lb 8 oz (100.9 kg)  10/30/23 221 lb (100.2 kg)  07/31/23 224 lb 9.6 oz (101.9 kg)   BMI Readings from Last 3 Encounters:  03/02/24 33.83 kg/m  10/30/23 33.60 kg/m  07/31/23 34.15 kg/m     [employer] Visit from 03/02/2024 in [facility]  AUDIT-C Score 0     Married STD testing and prevention (HIV/chl/gon/syphilis):  not applicable Sexual history: one partner Hep C Screening: completed Skin cancer: Discussed monitoring for atypical lesions Colorectal cancer: up to date  Prostate cancer:  not applicable Lab Results  Component Value Date   PSA 1.1 06/17/2019   PSA 0.8 02/26/2018     Lung cancer:  Low Dose CT Chest recommended if Age 77-80 years, 30 pack-year currently smoking OR have quit w/in 15years. Patient  is a candidate for screening   AAA: The USPSTF recommends one-time screening with ultrasonography in men ages 71 to 63 years who have ever smoked. Patient   is not a candidate for screening  ECG:  2024  Vaccines: reviewed with the patient.   Advanced Care Planning: A voluntary discussion about advance care planning including the explanation and discussion of advance directives.  Discussed health care proxy and Living will, and the patient was able to identify a health care proxy as wife .  Patient does not have a living will and power of attorney of health care   Patient Active Problem List   Diagnosis Date Noted   Obesity, diabetes, and  hypertension syndrome (HCC) 03/02/2024   Dyslipidemia associated with type 2 diabetes mellitus (HCC) 03/02/2024   Intermittent low back pain 07/31/2023   Type 2 diabetes mellitus with diabetic neuropathy, with long-term current use of insulin  (HCC) 07/31/2023   History of colonic polyps 12/05/2022  Polyp of sigmoid colon 12/05/2022   Coronary artery disease of native artery of native heart with stable angina pectoris 11/26/2018   OSA (obstructive sleep apnea) 05/21/2015   Perennial allergic rhinitis 11/07/2014   Arteriosclerosis of coronary artery 11/07/2014   CD (contact dermatitis) 11/07/2014   Decreased libido 11/07/2014   Diabetes mellitus with renal manifestation (HCC) 11/07/2014   Gastro-esophageal reflux disease without esophagitis 11/07/2014   H/O acute myocardial infarction 11/07/2014   Hypercholesteremia 11/07/2014   Benign hypertension 11/07/2014   Hypertriglyceridemia 11/07/2014   H/O high risk medication treatment 11/07/2014   NASH (nonalcoholic steatohepatitis) 11/07/2014   Microalbuminuria 11/07/2014   Adult BMI 30+ 11/07/2014   Fungal infection of toenail 11/07/2014   Tobacco abuse 11/07/2014   ED (erectile dysfunction) of organic origin 09/29/2006    Past Surgical History:  Procedure Laterality Date   cadiac stenting     CARDIAC CATHETERIZATION     Stents 2003 & 2004   COLONOSCOPY WITH PROPOFOL  N/A 12/05/2022   Procedure: COLONOSCOPY WITH PROPOFOL ;  Surgeon: Jinny Carmine, MD;  Location: [facility];  Service: Endoscopy;  Laterality: N/A;  Diabetic   KNEE ARTHROSCOPY  1996   PILONIDAL CYST EXCISION  1983   POLYPECTOMY  12/05/2022   Procedure: POLYPECTOMY;  Surgeon: Jinny Carmine, MD;  Location: [facility];  Service: Endoscopy;;    Family History  Problem Relation Age of Onset   Diabetes Mother    CAD Mother    Heart disease Mother    Heart attack Mother    Heart disease Father    Heart attack Father    Cancer Sister     Social History    Socioeconomic History   Marital status: Married    Spouse name: Renee   Number of children: 4   Years of education: Not on file   Highest education level: Not on file  Occupational History   Not on file  Tobacco Use   Smoking status: Former    Current packs/day: 0.00    Average packs/day: 1.5 packs/day for 30.0 years (45.0 ttl pk-yrs)    Types: Cigarettes    Start date: 04/20/1983    Quit date: 04/19/2013    Years since quitting: 10.8    Passive exposure: Current   Smokeless tobacco: Current    Types: Snuff  Vaping Use   Vaping status: Former  Substance and Sexual Activity   Alcohol use: No    Alcohol/week: 0.0 standard drinks of alcohol    Comment: occ   Drug use: No   Sexual activity: Yes    Partners: Female  Other Topics Concern   Not on file  Social History Narrative   Not on file   Social Drivers of Health   Financial Resource Strain: Low Risk  (03/02/2024)   Overall Financial Resource Strain (CARDIA)    Difficulty of Paying Living Expenses: Not hard at all  Food Insecurity: No Food Insecurity (03/02/2024)   Hunger Vital Sign    Worried About Running Out of Food in the Last Year: Never true    Ran Out of Food in the Last Year: Never true  Transportation Needs: No Transportation Needs (03/02/2024)   PRAPARE - Administrator, Civil Service (Medical): No    Lack of Transportation (Non-Medical): No  Physical Activity: Inactive (03/02/2024)   Exercise Vital Sign    Days of Exercise per Week: 0 days    Minutes of Exercise per Session: 0 min  Stress: No Stress Concern Present (03/02/2024)  [employer] of Occupational Health - Occupational Stress Questionnaire    Feeling of Stress: Not at all  Social Connections: Socially Integrated (03/02/2024)   Social Connection and Isolation Panel    Frequency of Communication with Friends and Family: More than three times a week    Frequency of Social Gatherings with Friends and Family: More than three  times a week    Attends Religious Services: More than 4 times per year    Active Member of [employer] or Organizations: Yes    Attends Engineer, Structural: More than 4 times per year    Marital Status: Married  Catering Manager Violence: Not At Risk (03/02/2024)   Humiliation, Afraid, Rape, and Kick questionnaire    Fear of Current or Ex-Partner: No    Emotionally Abused: No    Physically Abused: No    Sexually Abused: No     Current Outpatient Medications:    atorvastatin  (LIPITOR) 80 MG tablet, Take 1 tablet (80 mg total) by mouth daily., Disp: 90 tablet, Rfl: 1   clopidogrel  (PLAVIX ) 75 MG tablet, Take 1 tablet (75 mg total) by mouth daily., Disp: 90 tablet, Rfl: 1   Continuous Glucose Sensor (FREESTYLE LIBRE 3 PLUS SENSOR) MISC, 1 EACH BY OTHER ROUTE AS DIRECTED. CHANGE SENSOR EVERY 15 DAYS., Disp: 6 each, Rfl: 0   empagliflozin  (JARDIANCE ) 25 MG TABS tablet, Take 1 tablet (25 mg total) by mouth daily., Disp: 90 tablet, Rfl: 1   ezetimibe  (ZETIA ) 10 MG tablet, Take 1 tablet (10 mg total) by mouth daily., Disp: 90 tablet, Rfl: 1   famotidine  (PEPCID ) 40 MG tablet, Take 1 tablet (40 mg total) by mouth at bedtime., Disp: 90 tablet, Rfl: 1   fluticasone  (FLONASE ) 50 MCG/ACT nasal spray, Place 2 sprays into both nostrils as needed., Disp: 48 g, Rfl: 1   glucose blood (ONETOUCH ULTRA) test strip, 100 each by Other route See admin instructions. Use as instructed, Disp: 100 strip, Rfl: 11   insulin  glargine, 1 Unit Dial , (TOUJEO  SOLOSTAR) 300 UNIT/ML Solostar Pen, Inject 40-50 Units into the skin daily after breakfast., Disp: 45 mL, Rfl: 1   insulin  lispro (HUMALOG ) 100 UNIT/ML KwikPen, ADMINISTER 4 UNITS UNDER THE SKIN THREE TIMES DAILY, Disp: 9 mL, Rfl: 0   Insulin  Pen Needle 32G X 6 MM MISC, 1 each by Does not apply route daily at 2 PM., Disp: 100 each, Rfl: 1   lisinopril  (ZESTRIL ) 2.5 MG tablet, Take 1 tablet (2.5 mg total) by mouth daily., Disp: 90 tablet, Rfl: 1   loratadine   (CLARITIN ) 10 MG tablet, Take 1 tablet by mouth daily., Disp: , Rfl:    metaxalone  (SKELAXIN ) 800 MG tablet, Take 1 tablet (800 mg total) by mouth 3 (three) times daily., Disp: 90 tablet, Rfl: 1   metFORMIN  (GLUCOPHAGE -XR) 750 MG 24 hr tablet, Take 2 tablets (1,500 mg total) by mouth daily with breakfast., Disp: 180 tablet, Rfl: 1   metoprolol  succinate (TOPROL -XL) 25 MG 24 hr tablet, TAKE 1 TABLET (25 MG TOTAL) BY MOUTH DAILY., Disp: 90 tablet, Rfl: 0   omega-3 acid ethyl esters (LOVAZA ) 1 g capsule, Take 2 capsules (2 g total) by mouth 2 (two) times daily., Disp: 360 capsule, Rfl: 1   pantoprazole  (PROTONIX ) 40 MG tablet, Take 1 tablet (40 mg total) by mouth every morning., Disp: 90 tablet, Rfl: 1   pioglitazone  (ACTOS ) 15 MG tablet, TAKE 1 TABLET BY MOUTH EVERY DAY, Disp: 90 tablet, Rfl: 0   pregabalin  (LYRICA ) 200 MG capsule,  TAKE 1 CAPSULE BY MOUTH AT BEDTIME., Disp: 30 capsule, Rfl: 0   Semaglutide , 2 MG/DOSE, (OZEMPIC , 2 MG/DOSE,) 8 MG/3ML SOPN, INJECT 2 MG AS DIRECTED ONCE A WEEK., Disp: 3 mL, Rfl: 0   sildenafil  (VIAGRA ) 100 MG tablet, TAKE A HALF TO 1 TABLET BY MOUTH AS NEEDED FOR ERECTILE DYSFUNCTION, Disp: 30 tablet, Rfl: 0   Glucagon  (GVOKE HYPOPEN  1-PACK) 1 MG/0.2ML SOAJ, Inject 1 each into the skin daily as needed. If glucose very low and unable to eat/drink (Patient not taking: Reported on 03/02/2024), Disp: 0.2 mL, Rfl: 1  No Known Allergies   ROS  Ten systems reviewed and is negative except as mentioned in HPI     Objective  Vitals:   03/02/24 0805  BP: 130/74  Pulse: 87  Resp: 16  SpO2: 98%  Weight: 222 lb 8 oz (100.9 kg)  Height: 5' 8 (1.727 m)    Body mass index is 33.83 kg/m.  Physical Exam  Constitutional: Patient appears well-developed and well-nourished. No distress.  HENT: Head: Normocephalic and atraumatic. Ears: B TMs ok, no erythema or effusion; Nose: Nose normal. Mouth/Throat: Oropharynx is clear and moist. No oropharyngeal exudate.  Eyes:  Conjunctivae and EOM are normal. Pupils are equal, round, and reactive to light. No scleral icterus.  Neck: Normal range of motion. Neck supple. No JVD present. No thyromegaly present.  Cardiovascular: Normal rate, regular rhythm and normal heart sounds.  No murmur heard. No BLE edema. Pulmonary/Chest: Effort normal and breath sounds normal. No respiratory distress. Abdominal: Soft. Bowel sounds are normal, no distension. There is no tenderness. no masses MALE GENITALIA: Normal descended testes bilaterally, no masses palpated, no hernias, no lesions, no discharge RECTAL: enlarged prostate  Musculoskeletal: Normal range of motion, no joint effusions. No gross deformities Neurological: he is alert and oriented to person, place, and time. No cranial nerve deficit. Coordination, balance, strength, speech and gait are normal.  Skin: Skin is warm and dry. No rash noted. No erythema.  Psychiatric: Patient has a normal mood and affect. behavior is normal. Judgment and thought content normal.   Diabetic Foot Exam - Simple   Simple Foot Form Visual Inspection See comments: Yes Sensation Testing Intact to touch and monofilament testing bilaterally: Yes Pulse Check Posterior Tibialis and Dorsalis pulse intact bilaterally: Yes Comments Onychomycosis        Assessment & Plan Adult Wellness Visit Routine wellness visit with family history of cancer discussed. Lung cancer screening discussed due to tobacco use history. - Ordered lung cancer screening due to tobacco use history. - Discussed genetic testing for hereditary cancer risk.  Type 2 diabetes mellitus, poorly controlled Poorly controlled diabetes with average glucose of 135 mg/dL. Out of metformin  and Jardiance . Current regimen includes Ozempic , Toujeo , and sliding scale insulin . Discussed Omnipod for future use. - Continue current diabetes medications: Ozempic  2 mg, Toujeo  30 units daily, sliding scale insulin . - Provided information on  Omnipod for future consideration. - Ordered blood work including B12 and folate levels.  Atherosclerotic heart disease with angina and prior stents Atherosclerotic heart disease with prior stents. No current chest pain or palpitations. Continues on cardiovascular medications. - Continue Plavix , ezetimibe , atorvastatin , and Lovaza . - Ordered blood work.  Essential hypertension Well-controlled hypertension with current regimen. Blood pressure 130/74 mmHg. - Continue lisinopril  and metoprolol .  Obesity BMI of 30. Weight stable. Discussed weight loss through diet and physical activity. - Encouraged portion control and physical activity. - Continue Ozempic  for weight management.  Dyslipidemia Managed with atorvastatin  and  ezetimibe . - Continue atorvastatin  and ezetimibe .  Benign prostatic hyperplasia with lower urinary tract symptoms Reports frequent urination. Discussed Flomax  and PSA testing. - Prescribed Flomax  for urinary symptoms. - Ordered PSA test.  Gastroesophageal reflux disease (GERD) GERD well-controlled with pantoprazole . - Continue pantoprazole .  Sleep apnea Reports waking with headaches, possibly related to sleep apnea. Discussed sinus issues and stress as factors. - Consider Flonase  and Claritin  for sinus issues.  Chronic back pain Managed with pregabalin . Reports burning sensation likely related to back issues. - Continue pregabalin . - Consider increasing pregabalin  to twice daily if needed.  Tobacco use, current dipper, former smoker Current dipper with history of smoking. Lung cancer screening discussed. - Ordered lung cancer screening.        -Prostate cancer screening and PSA options (with potential risks and benefits of testing vs not testing) were discussed along with recent recs/guidelines. -USPSTF grade A and B recommendations reviewed with patient; age-appropriate recommendations, preventive care, screening tests, etc discussed and encouraged;  healthy living encouraged; see AVS for patient education given to patient -Discussed importance of 150 minutes of physical activity weekly, eat two servings of fish weekly, eat one serving of tree nuts ( cashews, pistachios, pecans, almonds.SABRA) every other day, eat 6 servings of fruit/vegetables daily and drink plenty of water  and avoid sweet beverages.  -Reviewed Health Maintenance: yes

## 2024-03-03 LAB — COMPREHENSIVE METABOLIC PANEL WITH GFR
AG Ratio: 1.9 (calc) (ref 1.0–2.5)
ALT: 27 U/L (ref 9–46)
AST: 17 U/L (ref 10–35)
Albumin: 4.4 g/dL (ref 3.6–5.1)
Alkaline phosphatase (APISO): 53 U/L (ref 35–144)
BUN/Creatinine Ratio: 24 (calc) — ABNORMAL HIGH (ref 6–22)
BUN: 14 mg/dL (ref 7–25)
CO2: 26 mmol/L (ref 20–32)
Calcium: 9.3 mg/dL (ref 8.6–10.3)
Chloride: 103 mmol/L (ref 98–110)
Creat: 0.59 mg/dL — ABNORMAL LOW (ref 0.70–1.35)
Globulin: 2.3 g/dL (ref 1.9–3.7)
Glucose, Bld: 137 mg/dL — ABNORMAL HIGH (ref 65–99)
Potassium: 4.2 mmol/L (ref 3.5–5.3)
Sodium: 138 mmol/L (ref 135–146)
Total Bilirubin: 1.2 mg/dL (ref 0.2–1.2)
Total Protein: 6.7 g/dL (ref 6.1–8.1)
eGFR: 110 mL/min/1.73m2 (ref >=60)

## 2024-03-03 LAB — HEMOGLOBIN A1C
Hgb A1c MFr Bld: 6.9 % — ABNORMAL HIGH (ref <5.7)
Mean Plasma Glucose: 151 mg/dL
eAG (mmol/L): 8.4 mmol/L

## 2024-03-03 LAB — CBC WITH DIFFERENTIAL/PLATELET
Absolute Lymphocytes: 1820 {cells}/uL (ref 850–3900)
Absolute Monocytes: 742 {cells}/uL (ref 200–950)
Basophils Absolute: 42 {cells}/uL (ref 0–200)
Basophils Relative: 0.6 %
Eosinophils Absolute: 119 {cells}/uL (ref 15–500)
Eosinophils Relative: 1.7 %
HCT: 46.5 % (ref 39.4–51.1)
Hemoglobin: 15.5 g/dL (ref 13.2–17.1)
MCH: 31.1 pg (ref 27.0–33.0)
MCHC: 33.3 g/dL (ref 31.6–35.4)
MCV: 93.4 fL (ref 81.4–101.7)
MPV: 10.9 fL (ref 7.5–12.5)
Monocytes Relative: 10.6 %
Neutro Abs: 4277 {cells}/uL (ref 1500–7800)
Neutrophils Relative %: 61.1 %
Platelets: 234 Thousand/uL (ref 140–400)
RBC: 4.98 Million/uL (ref 4.20–5.80)
RDW: 11.9 % (ref 11.0–15.0)
Total Lymphocyte: 26 %
WBC: 7 Thousand/uL (ref 3.8–10.8)

## 2024-03-03 LAB — B12 AND FOLATE PANEL
Folate: 6.5 ng/mL
Vitamin B-12: 1575 pg/mL — ABNORMAL HIGH (ref 200–1100)

## 2024-03-03 LAB — MICROALBUMIN / CREATININE URINE RATIO
Creatinine, Urine: 78 mg/dL (ref 20–320)
Microalb Creat Ratio: 158 mg/g{creat} — ABNORMAL HIGH (ref <30)
Microalb, Ur: 12.3 mg/dL

## 2024-03-03 LAB — LIPID PANEL
Cholesterol: 92 mg/dL (ref <200)
HDL: 41 mg/dL (ref >=40)
LDL Cholesterol (Calc): 37 mg/dL
Non-HDL Cholesterol (Calc): 51 mg/dL (ref <130)
Total CHOL/HDL Ratio: 2.2 (calc) (ref <5.0)
Triglycerides: 67 mg/dL (ref <150)

## 2024-03-06 ENCOUNTER — Other Ambulatory Visit: Payer: Self-pay | Admitting: Family Medicine

## 2024-03-06 ENCOUNTER — Ambulatory Visit: Payer: Self-pay | Admitting: Family Medicine

## 2024-03-06 DIAGNOSIS — N529 Male erectile dysfunction, unspecified: Secondary | ICD-10-CM

## 2024-03-07 ENCOUNTER — Other Ambulatory Visit: Payer: Self-pay | Admitting: Family Medicine

## 2024-03-07 ENCOUNTER — Other Ambulatory Visit: Payer: Self-pay

## 2024-03-07 DIAGNOSIS — E1140 Type 2 diabetes mellitus with diabetic neuropathy, unspecified: Secondary | ICD-10-CM

## 2024-03-07 DIAGNOSIS — Z87891 Personal history of nicotine dependence: Secondary | ICD-10-CM

## 2024-03-07 MED ORDER — PREGABALIN 200 MG PO CAPS: 200.0000 mg | ORAL_CAPSULE | Freq: Two times a day (BID) | ORAL | 1 refills | Status: AC

## 2024-03-07 NOTE — Telephone Encounter (Signed)
 Requested medication (s) are due for refill today: yes  Requested medication (s) are on the active medication list: yes  Last refill:  03/02/24  Future visit scheduled: yes  Notes to clinic: Pharmacy comment: Alternative Requested:THE PRESCRIBED MEDICATION IS NOT COVERED BY INSURANCE. PLEASE CONSIDER CHANGING TO ONE OF THE SUGGESTED COVERED ALTERNATIVES.       Requested Prescriptions  Pending Prescriptions Disp Refills   cimetidine (TAGAMET) 800 MG tablet [Pharmacy Med Name: CIMETIDINE 800 MG TABLET]  0     Gastroenterology:  H2 Antagonists - cimetidine Failed - 03/07/2024 11:25 AM      Failed - Cr in normal range and within 360 days    Creat  Date Value Ref Range Status  03/02/2024 0.59 (L) 0.70 - 1.35 mg/dL Final   Creatinine, Urine  Date Value Ref Range Status  03/02/2024 78 20 - 320 mg/dL Final         Passed - Valid encounter within last 12 months    Recent Outpatient Visits           5 days ago Well adult exam   [facility] Glenard Mire, MD   4 months ago Dyslipidemia associated with type 2 diabetes mellitus [facility])   [facility] Glenard Mire, MD   7 months ago Dyslipidemia associated with type 2 diabetes mellitus [facility])   [facility] Sowles, Krichna, MD

## 2024-03-08 NOTE — Telephone Encounter (Signed)
 Second request

## 2024-03-18 ENCOUNTER — Telehealth: Payer: Self-pay

## 2024-03-18 ENCOUNTER — Other Ambulatory Visit: Payer: Self-pay | Admitting: Family Medicine

## 2024-03-18 ENCOUNTER — Inpatient Hospital Stay: Admission: RE | Admit: 2024-03-18 | Discharge: 2024-03-18 | Attending: Family Medicine | Admitting: Family Medicine

## 2024-03-18 DIAGNOSIS — E119 Type 2 diabetes mellitus without complications: Secondary | ICD-10-CM

## 2024-03-18 DIAGNOSIS — Z87891 Personal history of nicotine dependence: Secondary | ICD-10-CM

## 2024-03-18 DIAGNOSIS — E1169 Type 2 diabetes mellitus with other specified complication: Secondary | ICD-10-CM

## 2024-03-18 DIAGNOSIS — E1140 Type 2 diabetes mellitus with diabetic neuropathy, unspecified: Secondary | ICD-10-CM

## 2024-03-18 DIAGNOSIS — I25118 Atherosclerotic heart disease of native coronary artery with other forms of angina pectoris: Secondary | ICD-10-CM

## 2024-03-18 NOTE — Telephone Encounter (Signed)
 Pt states starting in the new year his insurance will cover the generic one and two until he meets his deductible. He was just wanting to let Dr Glenard know so that the name brand is not called in on his next refills

## 2024-03-24 ENCOUNTER — Telehealth: Payer: Self-pay

## 2024-03-24 NOTE — Progress Notes (Signed)
 Complex Care Management Note  Care Guide Note 03/24/2024 Name: SHAQUON GROPP MRN: [id number] DOB: [birth year]  SAMSON RALPH is a 63 y.o. year old male who sees Sowles, Krichna, MD for primary care. I reached out to Lynwood FORBES Dross by phone today to offer complex care management services.  Mr. Risinger was given information about Complex Care Management services today including:   The Complex Care Management services include support from the care team which includes your Nurse Care Manager, Clinical Social Worker, or Pharmacist.  The Complex Care Management team is here to help remove barriers to the health concerns and goals most important to you. Complex Care Management services are voluntary, and the patient may decline or stop services at any time by request to their care team member.   Complex Care Management Consent Status: Patient agreed to services and verbal consent obtained.   Follow up plan:  Telephone appointment with complex care management team member scheduled for:  03/24/24 at 3:00 p.m.   Encounter Outcome:  Patient Scheduled  Dreama [facility]  [facility] VBCI Assistant Direct Dial : [phone]  Fax: [phone]

## 2024-03-29 ENCOUNTER — Ambulatory Visit

## 2024-03-29 DIAGNOSIS — E1140 Type 2 diabetes mellitus with diabetic neuropathy, unspecified: Secondary | ICD-10-CM

## 2024-03-29 DIAGNOSIS — Z794 Long term (current) use of insulin: Secondary | ICD-10-CM | POA: Diagnosis not present

## 2024-03-29 NOTE — Progress Notes (Signed)
 "  S:     Reason for visit: ?  Jesse Lawrence is a 63 y.o. male with a history of diabetes (type 2), who presents today for an initial diabetes Face to Face pharmacotherapy visit.? Pertinent PMH also includes CAD, HTN, OSA, GERD, NASH, obesity.  They were referred to the pharmacist by their PCP for assistance in managing diabetes.  Care Team: Primary Care Provider: Sowles, Krichna, MD  At last visit with PCP on 03/02/24, A1c was improved at 6.9%.   In the interim, patient reported his insurance would be changing in the new year and he would like have high copays for any brand name medications.   Current diabetes medications include: Jardiance  25 mg daily, Toujeo  50 units daily, Humalog  per sliding scale TID with meals, metformin  XR 750 mg 2 tablets daily, pioglitazone  15 mg daily, Ozempic  2 mg weekly Previous diabetes medications include: Mounjaro  (itching), Xigduo , Trulicity  Current hypertension medications include: lisinopril  2.5 mg daily, metoprolol  succinate 25 mg daily Current hyperlipidemia medications include: atorvastatin  80 mg daily, ezetimibe  10 mg daily, Lovaza  2 g BID  Patient reports adherence to taking all medications as prescribed.   Have you been experiencing any side effects to the medications prescribed? no Do you have any problems obtaining medications due to transportation or finances? no Insurance coverage: [facility]  Patient denies hypoglycemic events.  Patient reported dietary habits: Eats 3 meals/day Breakfast: 2 deviled eggs, beef stick, sausage Snacks: beef sticks, mixed nuts  DM Prevention:  Statin: Taking; high intensity.?  ACE/ARB: yes; lisinopril  History of chronic kidney disease? no Last urinary albumin/creatinine ratio:  Lab Results  Component Value Date   MICRALBCREAT 158 (H) 03/02/2024   MICRALBCREAT 246 (H) 01/16/2023   MICRALBCREAT 102 (H) 12/27/2021   MICRALBCREAT 182 (H) 08/13/2020   MICRALBCREAT 86 (H) 06/17/2019    MICRALBCREAT 73 (H) 08/26/2018   Last eye exam:  Lab Results  Component Value Date   HMDIABEYEEXA No Retinopathy 02/16/2017   Lab Results  Component Value Date   HMDIABEYEEXA No Retinopathy 02/16/2017   Last foot exam: No foot exam found Tobacco Use:  Tobacco Use: High Risk (03/02/2024)   Patient History    Smoking Tobacco Use: Former    Smokeless Tobacco Use: Current    Passive Exposure: Current   O:  LibreView Report    Vitals:  Wt Readings from Last 3 Encounters:  03/02/24 222 lb 4 oz (100.8 kg)  03/02/24 222 lb 8 oz (100.9 kg)  10/30/23 221 lb (100.2 kg)   BP Readings from Last 3 Encounters:  03/02/24 110/64  03/02/24 130/74  10/30/23 118/68   Pulse Readings from Last 3 Encounters:  03/02/24 71  03/02/24 87  10/30/23 79     Labs:?  Lab Results  Component Value Date   HGBA1C 6.9 (H) 03/02/2024   HGBA1C 6.5 (A) 10/30/2023   HGBA1C 7.8 (A) 07/31/2023   GLUCOSE 137 (H) 03/02/2024   MICRALBCREAT 158 (H) 03/02/2024   MICRALBCREAT 246 (H) 01/16/2023   MICRALBCREAT 102 (H) 12/27/2021   CREATININE 0.59 (L) 03/02/2024   CREATININE 0.70 01/16/2023   CREATININE 1.02 05/05/2022    Lab Results  Component Value Date   CHOL 92 03/02/2024   LDLCALC 37 03/02/2024   LDLCALC 51 01/16/2023   LDLCALC  12/27/2021     Comment:     . LDL cholesterol not calculated. Triglyceride levels greater than 400 mg/dL invalidate calculated LDL results. . Reference range: <100 . Desirable range <100 mg/dL for primary  prevention;   <70 mg/dL for patients with CHD or diabetic patients  with > or = 2 CHD risk factors. SABRA LDL-C is now calculated using the Martin-Hopkins  calculation, which is a validated novel method providing  better accuracy than the Friedewald equation in the  estimation of LDL-C.  Gladis APPLETHWAITE et al. SANDREA. 7986;689(80): 2061-2068  (http://education.[facility].com/faq/FAQ164)    HDL 41 03/02/2024   TRIG 67 03/02/2024   TRIG 85 01/16/2023   TRIG 407  (H) 12/27/2021   ALT 27 03/02/2024   ALT 22 01/16/2023   AST 17 03/02/2024   AST 15 01/16/2023      Chemistry      Component Value Date/Time   NA 138 03/02/2024 0859   NA 135 08/17/2015 1000   NA 140 08/02/2011 0935   K 4.2 03/02/2024 0859   K 4.0 08/02/2011 0935   CL 103 03/02/2024 0859   CL 108 (H) 08/02/2011 0935   CO2 26 03/02/2024 0859   CO2 23 08/02/2011 0935   BUN 14 03/02/2024 0859   BUN 15 08/17/2015 1000   BUN 16 08/02/2011 0935   CREATININE 0.59 (L) 03/02/2024 0859      Component Value Date/Time   CALCIUM  9.3 03/02/2024 0859   CALCIUM  8.5 08/02/2011 0935   ALKPHOS 66 05/26/2016 1026   ALKPHOS 70 08/02/2011 0935   AST 17 03/02/2024 0859   AST 42 (H) 08/02/2011 0935   ALT 27 03/02/2024 0859   ALT 84 (H) 08/02/2011 0935   BILITOT 1.2 03/02/2024 0859   BILITOT 1.0 08/17/2015 1000   BILITOT 0.9 08/02/2011 0935       The ASCVD Risk score (Arnett DK, et al., 2019) failed to calculate for the following reasons:   Risk score cannot be calculated because patient has a medical history suggesting prior/existing ASCVD   * - Cholesterol units were assumed  Lab Results  Component Value Date   MICRALBCREAT 158 (H) 03/02/2024   MICRALBCREAT 246 (H) 01/16/2023   MICRALBCREAT 102 (H) 12/27/2021   MICRALBCREAT 182 (H) 08/13/2020   MICRALBCREAT 86 (H) 06/17/2019   MICRALBCREAT 73 (H) 08/26/2018    A/P: Diabetes currently controlled with a most recent A1c of 6.9% on 03/02/24. Patient is able to verbalize appropriate hypoglycemia management plan. Medication adherence appears appropriate. Patient would likely be a good candidate for Omnipod given high total daily insulin  dose. It also appears that patient has met deductible for the new year and that his copay for the Omnipod is $0. Discussed use of insulin  pump with patient - he is agreeable. If patient switches to Omnipod 5 insulin  pump, he would likely only have 2 additional brand name products: SGLT2i and GLP1. May  discuss copay cards in the new year to assist with cost of these agents. Will send Omnipod supplies for patient to pick up in the interim while he has met his deductible.  -Continued basal insulin  Toujeo  (insulin  glargine)  50 units daily.  -Continued rapid insulin  Humalog  (insulin  lispro) per sliding scale.  -Continued GLP-1 Ozempic  (semaglutide ) 2 mg weekly.  -Continued SGLT2-I Jardiance  (empagliflozin ) 25 mg daily.  -Continued metformin  XR 750 mg BID -Continued pioglitazone  15 mg daily. -Sent prescription for Omnipod 5 pump supplies, Freestyle Libre 2+ and reader (as this is compatible with the pump), and Humalog  vial to discuss initiation at follow up.   -Extensively discussed pathophysiology of diabetes, recommended lifestyle interventions, dietary effects on blood sugar control.  -Counseled on s/sx of and management of hypoglycemia.   ASCVD risk -  secondary prevention in patient with diabetes. Last LDL is 37 mg/dL, at goal of <44 mg/dL.  -Continued atorvastatin  80 mg daily.  -Continued ezetimibe  10 mg daily  -Continued Lovaza  2 g BID  Patient verbalized understanding of treatment plan. Total time patient counseling 60 minutes.  Follow-up:  Pharmacist on 04/26/24 PCP clinic visit on 06/03/24  Peyton CHARLENA Ferries, PharmD, CPP Clinical Pharmacist [facility] [phone]   "

## 2024-03-30 ENCOUNTER — Other Ambulatory Visit: Payer: Self-pay

## 2024-03-30 ENCOUNTER — Other Ambulatory Visit: Payer: Self-pay | Admitting: Family Medicine

## 2024-03-30 MED ORDER — FREESTYLE LIBRE 2 PLUS SENSOR MISC
0 refills | Status: AC
  Filled 2024-03-30 (×2): qty 6, 90d supply, fill #0
  Filled 2024-04-06: qty 6, 84d supply, fill #0

## 2024-03-30 MED ORDER — OMNIPOD 5 LIBRE2 PLUS G6 PODS MISC
3 refills | Status: AC
  Filled 2024-03-30: qty 15, 30d supply, fill #0

## 2024-03-30 MED ORDER — OMNIPOD 5 LIBRE2 G6 INTRO GEN5 KIT
PACK | 0 refills | Status: DC
  Filled 2024-03-30: qty 1, 30d supply, fill #0

## 2024-03-30 MED ORDER — FREESTYLE LIBRE 2 READER DEVI
0 refills | Status: DC
  Filled 2024-03-30 (×2): qty 1, 30d supply, fill #0
  Filled 2024-03-30: qty 1, 90d supply, fill #0

## 2024-04-01 ENCOUNTER — Other Ambulatory Visit: Payer: Self-pay

## 2024-04-01 MED ORDER — INSULIN LISPRO (1 UNIT DIAL) 100 UNIT/ML (KWIKPEN)
200.0000 [IU] | PEN_INJECTOR | SUBCUTANEOUS | 3 refills | Status: DC
  Filled 2024-04-01: qty 45, 44d supply, fill #0

## 2024-04-01 NOTE — Telephone Encounter (Signed)
 Refilled 04/01/25. Requested Prescriptions  Refused Prescriptions Disp Refills   insulin  lispro (HUMALOG ) 100 UNIT/ML KwikPen [Pharmacy Med Name: INSULIN  LISPRO 100 UNIT/ML PEN]      Sig: INJECT 4-14 UNITS INTO THE SKIN 3 (THREE) TIMES DAILY.     Endocrinology:  Diabetes - Insulins Passed - 04/01/2024  1:11 PM      Passed - HBA1C is between 0 and 7.9 and within 180 days    HbA1c, POC (controlled diabetic range)  Date Value Ref Range Status  11/26/2018 5.7 0.0 - 7.0 % Final   Hgb A1c MFr Bld  Date Value Ref Range Status  03/02/2024 6.9 (H) <5.7 % Final    Comment:    For someone without known diabetes, a hemoglobin A1c value of 6.5% or greater indicates that they may have  diabetes and this should be confirmed with a follow-up  test. . For someone with known diabetes, a value <7% indicates  that their diabetes is well controlled and a value  greater than or equal to 7% indicates suboptimal  control. A1c targets should be individualized based on  duration of diabetes, age, comorbid conditions, and  other considerations. . Currently, no consensus exists regarding use of hemoglobin A1c for diagnosis of diabetes for children. SABRA Amy - Valid encounter within last 6 months    Recent Outpatient Visits           1 month ago Well adult exam   [facility] Glenard Mire, MD   5 months ago Dyslipidemia associated with type 2 diabetes mellitus [facility])   [facility] Glenard Mire, [facility]   8 months ago Dyslipidemia associated with type 2 diabetes mellitus [facility])   [facility] Sowles, Krichna, MD

## 2024-04-03 ENCOUNTER — Other Ambulatory Visit: Payer: Self-pay

## 2024-04-04 ENCOUNTER — Telehealth: Payer: Self-pay

## 2024-04-04 ENCOUNTER — Other Ambulatory Visit: Payer: Self-pay

## 2024-04-04 MED ORDER — INSULIN LISPRO 100 UNIT/ML IJ SOLN
INTRAMUSCULAR | 5 refills | Status: AC
  Filled 2024-04-04 (×2): qty 60, 30d supply, fill #0
  Filled 2024-04-04: qty 30, 30d supply, fill #0

## 2024-04-04 NOTE — Addendum Note (Signed)
 Addended by: GLENARD MIRE F on: 04/04/2024 12:59 PM   Modules accepted: Orders

## 2024-04-04 NOTE — Telephone Encounter (Signed)
 Brief Telephone Documentation Reason for Call: Patient left message regarding question for pharmacist about insulin  vials   Summary of Call: It appears Humalog  pens were sent to the pharmacy instead of insulin  vials. Explained that patient can draw out of the insulin  pen to fill up the Omnipod, but it will no longer allow him to use the pen mechanism if he does so.  Sent over prescription for Humalog  vials to the pharmacy.   Follow Up: Patient given direct line for further questions/concerns.  Shakyia Bosso E. Marsh, PharmD Clinical Pharmacist [facility] [phone]

## 2024-04-04 NOTE — Addendum Note (Signed)
 Addended by: MARSH, Makani Seckman E on: 04/04/2024 12:57 PM   Modules accepted: Orders

## 2024-04-06 ENCOUNTER — Other Ambulatory Visit (HOSPITAL_COMMUNITY): Payer: Self-pay

## 2024-04-06 ENCOUNTER — Other Ambulatory Visit: Payer: Self-pay

## 2024-04-08 ENCOUNTER — Other Ambulatory Visit: Payer: Self-pay | Admitting: Family Medicine

## 2024-04-08 DIAGNOSIS — N529 Male erectile dysfunction, unspecified: Secondary | ICD-10-CM

## 2024-04-08 NOTE — Telephone Encounter (Signed)
 Requested Prescriptions  Pending Prescriptions Disp Refills   sildenafil  (VIAGRA ) 100 MG tablet [Pharmacy Med Name: SILDENAFIL  100 MG TABLET] 90 tablet 1    Sig: TAKE A HALF TO 1 TABLET BY MOUTH AS NEEDED FOR ERECTILE DYSFUNCTION     Urology: Erectile Dysfunction Agents Passed - 04/08/2024  2:19 PM      Passed - AST in normal range and within 360 days    AST  Date Value Ref Range Status  03/02/2024 17 10 - 35 U/L Final   SGOT(AST)  Date Value Ref Range Status  08/02/2011 42 (H) 15 - 37 Unit/L Final         Passed - ALT in normal range and within 360 days    ALT  Date Value Ref Range Status  03/02/2024 27 9 - 46 U/L Final   SGPT (ALT)  Date Value Ref Range Status  08/02/2011 84 (H) U/L Final    Comment:    12-78 NOTE: NEW REFERENCE RANGE 02/28/2011          Passed - Last BP in normal range    BP Readings from Last 1 Encounters:  03/02/24 110/64         Passed - Valid encounter within last 12 months    Recent Outpatient Visits           1 month ago Well adult exam   [facility] Glenard Mire, MD   5 months ago Dyslipidemia associated with type 2 diabetes mellitus [facility])   [facility] Glenard Mire, MD   8 months ago Dyslipidemia associated with type 2 diabetes mellitus [facility])   [facility] Sowles, Krichna, MD

## 2024-04-20 ENCOUNTER — Other Ambulatory Visit: Payer: Self-pay

## 2024-04-22 ENCOUNTER — Other Ambulatory Visit: Payer: Self-pay

## 2024-04-26 ENCOUNTER — Ambulatory Visit (INDEPENDENT_AMBULATORY_CARE_PROVIDER_SITE_OTHER)

## 2024-04-26 DIAGNOSIS — Z794 Long term (current) use of insulin: Secondary | ICD-10-CM

## 2024-04-26 DIAGNOSIS — E1140 Type 2 diabetes mellitus with diabetic neuropathy, unspecified: Secondary | ICD-10-CM

## 2024-04-26 NOTE — Progress Notes (Signed)
" ° °  04/26/2024 Name: Jesse Lawrence MRN: [id number] DOB: Dec 24, 1960  Chief Complaint  Patient presents with   Medication Management    Omnipod Training    Patient presented to clinic today for initial Omnipod training, conducted by [place].     Subjective:  Care Team: Primary Care Provider: Sowles, Krichna, MD  Current diabetes medications include: Jardiance  25 mg daily, Toujeo  50 units daily, Humalog  per sliding scale TID with meals, metformin  XR 750 mg 2 tablets daily, pioglitazone  15 mg daily, Ozempic  2 mg weekly   Assessment/Plan:   Pump Therapy (initiated on 04/26/24) Pump: Omnipod 5 Insulin : Humalog  100 units/mL Infusion Set: Omnipod 5 pod (tubeless; receives via the pharmacy) Pump Settings:  Basal: 1.0 units/hour Max basal: 2.0 units/hour Correction factor: 1:35 Carb ratio: 1:8.2 Max bolus: 30 units Correct above: 110 mg/dL  Discontinued basal insulin  Toujeo  (insulin  glargine)   Pump Education - Patient appeared to have sufficient understanding of subjects discussed.   Follow Up Plan:  Omnipod trainer in one week Pharmacist on 05/10/24 PCP on 06/03/24  Jesse Lawrence, PharmD, BCACP, CPP Clinical Pharmacist [facility] [phone]     "

## 2024-05-10 ENCOUNTER — Ambulatory Visit

## 2024-05-10 DIAGNOSIS — Z7984 Long term (current) use of oral hypoglycemic drugs: Secondary | ICD-10-CM

## 2024-05-10 DIAGNOSIS — E1121 Type 2 diabetes mellitus with diabetic nephropathy: Secondary | ICD-10-CM | POA: Diagnosis not present

## 2024-05-10 DIAGNOSIS — Z7985 Long-term (current) use of injectable non-insulin antidiabetic drugs: Secondary | ICD-10-CM | POA: Diagnosis not present

## 2024-05-10 NOTE — Progress Notes (Unsigned)
 "  S:     Reason for visit: ?  Jesse Lawrence is a 64 y.o. male with a history of diabetes (type 2), who presents today for an initial diabetes Face to Face pharmacotherapy visit.? Pertinent PMH also includes CAD, HTN, OSA, GERD, NASH, obesity.  They were referred to the pharmacist by their PCP for assistance in managing diabetes.  Care Team: Primary Care Provider: Sowles, Krichna, MD  At last visit with pharmacist on 04/26/24, patient was started on Omnipod 5.  Today he presents to clinic with his wife. He reports he enjoys using Omnipod and that it has helped simplify his insulin  regimen. He also reports that Ozempic  is ~$400 a month on his insurance until he meets his deductible.   Current diabetes medications include: Jardiance  25 mg daily, metformin  XR 750 mg 2 tablets daily, pioglitazone  15 mg daily, Ozempic  2 mg weekly, Omnipod 5 (see settings below) Previous diabetes medications include: Mounjaro  (itching), Xigduo , Trulicity  Current hypertension medications include: lisinopril  2.5 mg daily, metoprolol  succinate 25 mg daily Current hyperlipidemia medications include: atorvastatin  80 mg daily, ezetimibe  10 mg daily, Lovaza  2 g BID  Pump Therapy (initiated on 04/26/24) Pump: Omnipod 5 Insulin : Humalog  100 units/mL Infusion Set: Omnipod 5 pod (tubeless; receives via the pharmacy) Pump Settings:  Basal: 1.0 units/hour Max basal: 2.0 units/hour Correction factor: 1:35 Carb ratio: 1:8.2 Max bolus: 30 units Correct above: 110 mg/dL   Patient reports adherence to taking all medications as prescribed.   Have you been experiencing any side effects to the medications prescribed? no Do you have any problems obtaining medications due to transportation or finances? Yes - Ozempic  Insurance coverage: [employer] commercial insurance  Patient denies hypoglycemic events.  Patient reported dietary habits: Eats 3 meals/day Breakfast: 2 deviled eggs, beef stick, sausage Snacks: beef sticks, mixed  nuts  DM Prevention:  Statin: Taking; high intensity.?  ACE/ARB: yes; lisinopril  History of chronic kidney disease? no Last urinary albumin/creatinine ratio:  Lab Results  Component Value Date   MICRALBCREAT 158 (H) 03/02/2024   MICRALBCREAT 246 (H) 01/16/2023   MICRALBCREAT 102 (H) 12/27/2021   MICRALBCREAT 182 (H) 08/13/2020   MICRALBCREAT 86 (H) 06/17/2019   MICRALBCREAT 73 (H) 08/26/2018   Last eye exam:  Lab Results  Component Value Date   HMDIABEYEEXA No Retinopathy 02/16/2017   Lab Results  Component Value Date   HMDIABEYEEXA No Retinopathy 02/16/2017   Last foot exam: No foot exam found Tobacco Use:  Tobacco Use: High Risk (03/02/2024)   Patient History    Smoking Tobacco Use: Former    Smokeless Tobacco Use: Current    Passive Exposure: Current   O:  Glooko Report:      Vitals:  Wt Readings from Last 3 Encounters:  03/02/24 222 lb 4 oz (100.8 kg)  03/02/24 222 lb 8 oz (100.9 kg)  10/30/23 221 lb (100.2 kg)   BP Readings from Last 3 Encounters:  03/02/24 110/64  03/02/24 130/74  10/30/23 118/68   Pulse Readings from Last 3 Encounters:  03/02/24 71  03/02/24 87  10/30/23 79     Labs:?  Lab Results  Component Value Date   HGBA1C 6.9 (H) 03/02/2024   HGBA1C 6.5 (A) 10/30/2023   HGBA1C 7.8 (A) 07/31/2023   GLUCOSE 137 (H) 03/02/2024   MICRALBCREAT 158 (H) 03/02/2024   MICRALBCREAT 246 (H) 01/16/2023   MICRALBCREAT 102 (H) 12/27/2021   CREATININE 0.59 (L) 03/02/2024   CREATININE 0.70 01/16/2023   CREATININE 1.02 05/05/2022    Lab  Results  Component Value Date   CHOL 92 03/02/2024   LDLCALC 37 03/02/2024   LDLCALC 51 01/16/2023   LDLCALC  12/27/2021     Comment:     . LDL cholesterol not calculated. Triglyceride levels greater than 400 mg/dL invalidate calculated LDL results. . Reference range: <100 . Desirable range <100 mg/dL for primary prevention;   <70 mg/dL for patients with CHD or diabetic patients  with > or = 2 CHD risk  factors. SABRA LDL-C is now calculated using the Martin-Hopkins  calculation, which is a validated novel method providing  better accuracy than the Friedewald equation in the  estimation of LDL-C.  Gladis APPLETHWAITE et al. SANDREA. 7986;689(80): 2061-2068  (http://education.[facility].com/faq/FAQ164)    HDL 41 03/02/2024   TRIG 67 03/02/2024   TRIG 85 01/16/2023   TRIG 407 (H) 12/27/2021   ALT 27 03/02/2024   ALT 22 01/16/2023   AST 17 03/02/2024   AST 15 01/16/2023      Chemistry      Component Value Date/Time   NA 138 03/02/2024 0859   NA 135 08/17/2015 1000   NA 140 08/02/2011 0935   K 4.2 03/02/2024 0859   K 4.0 08/02/2011 0935   CL 103 03/02/2024 0859   CL 108 (H) 08/02/2011 0935   CO2 26 03/02/2024 0859   CO2 23 08/02/2011 0935   BUN 14 03/02/2024 0859   BUN 15 08/17/2015 1000   BUN 16 08/02/2011 0935   CREATININE 0.59 (L) 03/02/2024 0859      Component Value Date/Time   CALCIUM  9.3 03/02/2024 0859   CALCIUM  8.5 08/02/2011 0935   ALKPHOS 66 05/26/2016 1026   ALKPHOS 70 08/02/2011 0935   AST 17 03/02/2024 0859   AST 42 (H) 08/02/2011 0935   ALT 27 03/02/2024 0859   ALT 84 (H) 08/02/2011 0935   BILITOT 1.2 03/02/2024 0859   BILITOT 1.0 08/17/2015 1000   BILITOT 0.9 08/02/2011 0935       The ASCVD Risk score (Arnett DK, et al., 2019) failed to calculate for the following reasons:   Risk score cannot be calculated because patient has a medical history suggesting prior/existing ASCVD   * - Cholesterol units were assumed  Lab Results  Component Value Date   MICRALBCREAT 158 (H) 03/02/2024   MICRALBCREAT 246 (H) 01/16/2023   MICRALBCREAT 102 (H) 12/27/2021   MICRALBCREAT 182 (H) 08/13/2020   MICRALBCREAT 86 (H) 06/17/2019   MICRALBCREAT 73 (H) 08/26/2018    A/P: Diabetes currently controlled with a most recent A1c of 6.9% on 03/02/24. Patient is able to verbalize appropriate hypoglycemia management plan. Medication adherence appears appropriate. Gl[place]o report  shows a TIR of 85%. Of note, patient was accidentally in manual mode for the past week limiting the ability of the Omnipod to self-adjust insulin  settings. No hypoglycemia was observed on his report. Additionally, he reports entering ghost carbs whenever he corrects his BG. May need to strengthen is correction factor at follow up, however, difficult to assess at this time as he has been incorrectly correcting BG. It appears that Trulicity  is the preferred GLP1 on his insurance at $25 a month. Will switch at this time. Patient also requested a refill of his glucagon  pen.  Pump Settings:  Basal: 1.0 units/hour (self-adjusted by the pump) Max basal: 2.0 units/hour Correction factor: 1:35 Carb ratio: 1:8.2 Max bolus: 30 units  Correct above: 110 mg/dL Switched GLP-1  Ozempic  (semaglutide ) to Trulicity  (dulaglutide ) 4.5 mg weekly.  Continued SGLT2-I Jardiance  (empagliflozin ) 25  mg daily.  Continued metformin  XR 750 mg BID Continued pioglitazone  15 mg daily. Counseled to verify on a daily basis that he is in automated mode on his Omnipod. Counseled to utilize the correction feature alone if desiring to correct elevated BG readings, rather than adding in meals when he is not eating.  Extensively discussed pathophysiology of diabetes, recommended lifestyle interventions, dietary effects on blood sugar control.  Counseled on s/sx of and management of hypoglycemia.  Sent Rx to the pharmacy for glucagon   ASCVD risk - secondary prevention in patient with diabetes. Last LDL is 37 mg/dL, at goal of <44 mg/dL.  Continued atorvastatin  80 mg daily.  Continued ezetimibe  10 mg daily  Continued Lovaza  2 g BID  Patient verbalized understanding of treatment plan. Total time patient counseling 60 minutes.  Follow-up:  Pharmacist on 06/03/24 PCP clinic visit on 06/03/24  Peyton CHARLENA Ferries, PharmD, BCACP, CPP Clinical Pharmacist [facility] [phone]    "

## 2024-05-11 ENCOUNTER — Other Ambulatory Visit: Payer: Self-pay

## 2024-05-11 MED ORDER — GVOKE HYPOPEN 1-PACK 1 MG/0.2ML ~~LOC~~ SOAJ
1.0000 | Freq: Every day | SUBCUTANEOUS | 1 refills | Status: AC | PRN
  Filled 2024-05-11: qty 0.2, 2d supply, fill #0

## 2024-05-11 MED ORDER — TRULICITY 4.5 MG/0.5ML ~~LOC~~ SOAJ
4.5000 mg | SUBCUTANEOUS | 1 refills | Status: AC
  Filled 2024-05-11: qty 2, 28d supply, fill #0

## 2024-06-03 ENCOUNTER — Ambulatory Visit: Admitting: Family Medicine

## 2024-06-03 ENCOUNTER — Other Ambulatory Visit
